# Patient Record
Sex: Male | Born: 1935 | Race: White | Hispanic: No | Smoking: Never smoker
Health system: Southern US, Community
[De-identification: ages and names within clinical notes are randomized; demographics above are authoritative.]

## PROBLEM LIST (undated history)

## (undated) DIAGNOSIS — J189 Pneumonia, unspecified organism: Secondary | ICD-10-CM

## (undated) DIAGNOSIS — Z8719 Personal history of other diseases of the digestive system: Secondary | ICD-10-CM

## (undated) DIAGNOSIS — I350 Nonrheumatic aortic (valve) stenosis: Secondary | ICD-10-CM

## (undated) DIAGNOSIS — Z5189 Encounter for other specified aftercare: Secondary | ICD-10-CM

## (undated) DIAGNOSIS — S300XXA Contusion of lower back and pelvis, initial encounter: Secondary | ICD-10-CM

## (undated) DIAGNOSIS — Z77090 Contact with and (suspected) exposure to asbestos: Secondary | ICD-10-CM

## (undated) DIAGNOSIS — E785 Hyperlipidemia, unspecified: Secondary | ICD-10-CM

## (undated) DIAGNOSIS — R011 Cardiac murmur, unspecified: Secondary | ICD-10-CM

## (undated) DIAGNOSIS — K219 Gastro-esophageal reflux disease without esophagitis: Secondary | ICD-10-CM

## (undated) DIAGNOSIS — C61 Malignant neoplasm of prostate: Secondary | ICD-10-CM

## (undated) DIAGNOSIS — S76011A Strain of muscle, fascia and tendon of right hip, initial encounter: Secondary | ICD-10-CM

## (undated) DIAGNOSIS — K5790 Diverticulosis of intestine, part unspecified, without perforation or abscess without bleeding: Secondary | ICD-10-CM

## (undated) HISTORY — DX: Malignant neoplasm of prostate: C61

## (undated) HISTORY — PX: ESOPHAGOGASTRODUODENOSCOPY: SHX1529

## (undated) HISTORY — DX: Pneumonia, unspecified organism: J18.9

## (undated) HISTORY — DX: Nonrheumatic aortic (valve) stenosis: I35.0

## (undated) HISTORY — DX: Personal history of other diseases of the digestive system: Z87.19

## (undated) HISTORY — DX: Encounter for other specified aftercare: Z51.89

## (undated) HISTORY — DX: Hyperlipidemia, unspecified: E78.5

---

## 1961-08-26 HISTORY — PX: APPENDECTOMY: SHX54

## 1968-08-26 HISTORY — PX: HEMORRHOID SURGERY: SHX153

## 1993-08-26 DIAGNOSIS — Z8711 Personal history of peptic ulcer disease: Secondary | ICD-10-CM

## 1993-08-26 HISTORY — DX: Personal history of peptic ulcer disease: Z87.11

## 1998-04-18 ENCOUNTER — Ambulatory Visit (HOSPITAL_COMMUNITY): Admission: RE | Admit: 1998-04-18 | Discharge: 1998-04-18 | Payer: Self-pay | Admitting: Internal Medicine

## 1999-06-18 ENCOUNTER — Other Ambulatory Visit: Admission: RE | Admit: 1999-06-18 | Discharge: 1999-06-18 | Payer: Self-pay | Admitting: Urology

## 1999-07-12 ENCOUNTER — Encounter: Payer: Self-pay | Admitting: Urology

## 1999-07-16 ENCOUNTER — Inpatient Hospital Stay (HOSPITAL_COMMUNITY): Admission: RE | Admit: 1999-07-16 | Discharge: 1999-07-19 | Payer: Self-pay | Admitting: Urology

## 2000-07-03 ENCOUNTER — Other Ambulatory Visit: Admission: RE | Admit: 2000-07-03 | Discharge: 2000-07-03 | Payer: Self-pay | Admitting: Gastroenterology

## 2000-08-26 DIAGNOSIS — C61 Malignant neoplasm of prostate: Secondary | ICD-10-CM

## 2000-08-26 HISTORY — PX: PROSTATECTOMY: SHX69

## 2000-08-26 HISTORY — DX: Malignant neoplasm of prostate: C61

## 2001-08-26 HISTORY — PX: LAPAROSCOPIC CHOLECYSTECTOMY: SUR755

## 2005-12-05 ENCOUNTER — Ambulatory Visit: Payer: Self-pay | Admitting: Gastroenterology

## 2005-12-19 ENCOUNTER — Ambulatory Visit: Payer: Self-pay | Admitting: Gastroenterology

## 2011-08-27 HISTORY — PX: COLONOSCOPY: SHX174

## 2011-11-20 DIAGNOSIS — M999 Biomechanical lesion, unspecified: Secondary | ICD-10-CM | POA: Diagnosis not present

## 2011-11-20 DIAGNOSIS — M5137 Other intervertebral disc degeneration, lumbosacral region: Secondary | ICD-10-CM | POA: Diagnosis not present

## 2011-11-25 DIAGNOSIS — M5137 Other intervertebral disc degeneration, lumbosacral region: Secondary | ICD-10-CM | POA: Diagnosis not present

## 2011-11-25 DIAGNOSIS — M999 Biomechanical lesion, unspecified: Secondary | ICD-10-CM | POA: Diagnosis not present

## 2011-11-26 DIAGNOSIS — M5137 Other intervertebral disc degeneration, lumbosacral region: Secondary | ICD-10-CM | POA: Diagnosis not present

## 2011-11-26 DIAGNOSIS — M999 Biomechanical lesion, unspecified: Secondary | ICD-10-CM | POA: Diagnosis not present

## 2011-11-28 DIAGNOSIS — M999 Biomechanical lesion, unspecified: Secondary | ICD-10-CM | POA: Diagnosis not present

## 2011-11-28 DIAGNOSIS — M5137 Other intervertebral disc degeneration, lumbosacral region: Secondary | ICD-10-CM | POA: Diagnosis not present

## 2011-12-02 DIAGNOSIS — M5137 Other intervertebral disc degeneration, lumbosacral region: Secondary | ICD-10-CM | POA: Diagnosis not present

## 2011-12-02 DIAGNOSIS — M999 Biomechanical lesion, unspecified: Secondary | ICD-10-CM | POA: Diagnosis not present

## 2011-12-04 DIAGNOSIS — M999 Biomechanical lesion, unspecified: Secondary | ICD-10-CM | POA: Diagnosis not present

## 2011-12-04 DIAGNOSIS — M5137 Other intervertebral disc degeneration, lumbosacral region: Secondary | ICD-10-CM | POA: Diagnosis not present

## 2011-12-05 DIAGNOSIS — M999 Biomechanical lesion, unspecified: Secondary | ICD-10-CM | POA: Diagnosis not present

## 2011-12-05 DIAGNOSIS — M5137 Other intervertebral disc degeneration, lumbosacral region: Secondary | ICD-10-CM | POA: Diagnosis not present

## 2011-12-09 DIAGNOSIS — M5137 Other intervertebral disc degeneration, lumbosacral region: Secondary | ICD-10-CM | POA: Diagnosis not present

## 2011-12-09 DIAGNOSIS — M999 Biomechanical lesion, unspecified: Secondary | ICD-10-CM | POA: Diagnosis not present

## 2011-12-11 DIAGNOSIS — M5137 Other intervertebral disc degeneration, lumbosacral region: Secondary | ICD-10-CM | POA: Diagnosis not present

## 2011-12-11 DIAGNOSIS — M999 Biomechanical lesion, unspecified: Secondary | ICD-10-CM | POA: Diagnosis not present

## 2011-12-12 DIAGNOSIS — M5137 Other intervertebral disc degeneration, lumbosacral region: Secondary | ICD-10-CM | POA: Diagnosis not present

## 2011-12-12 DIAGNOSIS — M999 Biomechanical lesion, unspecified: Secondary | ICD-10-CM | POA: Diagnosis not present

## 2011-12-16 DIAGNOSIS — M5137 Other intervertebral disc degeneration, lumbosacral region: Secondary | ICD-10-CM | POA: Diagnosis not present

## 2011-12-16 DIAGNOSIS — M999 Biomechanical lesion, unspecified: Secondary | ICD-10-CM | POA: Diagnosis not present

## 2011-12-18 DIAGNOSIS — M999 Biomechanical lesion, unspecified: Secondary | ICD-10-CM | POA: Diagnosis not present

## 2011-12-18 DIAGNOSIS — M5137 Other intervertebral disc degeneration, lumbosacral region: Secondary | ICD-10-CM | POA: Diagnosis not present

## 2011-12-19 DIAGNOSIS — M5137 Other intervertebral disc degeneration, lumbosacral region: Secondary | ICD-10-CM | POA: Diagnosis not present

## 2011-12-19 DIAGNOSIS — M999 Biomechanical lesion, unspecified: Secondary | ICD-10-CM | POA: Diagnosis not present

## 2011-12-23 DIAGNOSIS — M5137 Other intervertebral disc degeneration, lumbosacral region: Secondary | ICD-10-CM | POA: Diagnosis not present

## 2011-12-23 DIAGNOSIS — M999 Biomechanical lesion, unspecified: Secondary | ICD-10-CM | POA: Diagnosis not present

## 2011-12-25 DIAGNOSIS — M999 Biomechanical lesion, unspecified: Secondary | ICD-10-CM | POA: Diagnosis not present

## 2011-12-25 DIAGNOSIS — M5137 Other intervertebral disc degeneration, lumbosacral region: Secondary | ICD-10-CM | POA: Diagnosis not present

## 2011-12-26 DIAGNOSIS — M999 Biomechanical lesion, unspecified: Secondary | ICD-10-CM | POA: Diagnosis not present

## 2011-12-26 DIAGNOSIS — M5137 Other intervertebral disc degeneration, lumbosacral region: Secondary | ICD-10-CM | POA: Diagnosis not present

## 2011-12-30 DIAGNOSIS — M5137 Other intervertebral disc degeneration, lumbosacral region: Secondary | ICD-10-CM | POA: Diagnosis not present

## 2011-12-30 DIAGNOSIS — M999 Biomechanical lesion, unspecified: Secondary | ICD-10-CM | POA: Diagnosis not present

## 2012-01-01 DIAGNOSIS — M5137 Other intervertebral disc degeneration, lumbosacral region: Secondary | ICD-10-CM | POA: Diagnosis not present

## 2012-01-01 DIAGNOSIS — M999 Biomechanical lesion, unspecified: Secondary | ICD-10-CM | POA: Diagnosis not present

## 2012-01-02 DIAGNOSIS — M999 Biomechanical lesion, unspecified: Secondary | ICD-10-CM | POA: Diagnosis not present

## 2012-01-02 DIAGNOSIS — M5137 Other intervertebral disc degeneration, lumbosacral region: Secondary | ICD-10-CM | POA: Diagnosis not present

## 2012-01-07 DIAGNOSIS — M999 Biomechanical lesion, unspecified: Secondary | ICD-10-CM | POA: Diagnosis not present

## 2012-01-07 DIAGNOSIS — M5137 Other intervertebral disc degeneration, lumbosacral region: Secondary | ICD-10-CM | POA: Diagnosis not present

## 2012-01-08 DIAGNOSIS — M5137 Other intervertebral disc degeneration, lumbosacral region: Secondary | ICD-10-CM | POA: Diagnosis not present

## 2012-01-08 DIAGNOSIS — M999 Biomechanical lesion, unspecified: Secondary | ICD-10-CM | POA: Diagnosis not present

## 2012-01-09 DIAGNOSIS — M999 Biomechanical lesion, unspecified: Secondary | ICD-10-CM | POA: Diagnosis not present

## 2012-01-09 DIAGNOSIS — M5137 Other intervertebral disc degeneration, lumbosacral region: Secondary | ICD-10-CM | POA: Diagnosis not present

## 2012-01-13 DIAGNOSIS — M5137 Other intervertebral disc degeneration, lumbosacral region: Secondary | ICD-10-CM | POA: Diagnosis not present

## 2012-01-13 DIAGNOSIS — M999 Biomechanical lesion, unspecified: Secondary | ICD-10-CM | POA: Diagnosis not present

## 2012-01-15 DIAGNOSIS — M5137 Other intervertebral disc degeneration, lumbosacral region: Secondary | ICD-10-CM | POA: Diagnosis not present

## 2012-01-15 DIAGNOSIS — M999 Biomechanical lesion, unspecified: Secondary | ICD-10-CM | POA: Diagnosis not present

## 2012-01-16 DIAGNOSIS — M5137 Other intervertebral disc degeneration, lumbosacral region: Secondary | ICD-10-CM | POA: Diagnosis not present

## 2012-01-16 DIAGNOSIS — M999 Biomechanical lesion, unspecified: Secondary | ICD-10-CM | POA: Diagnosis not present

## 2012-01-21 DIAGNOSIS — M999 Biomechanical lesion, unspecified: Secondary | ICD-10-CM | POA: Diagnosis not present

## 2012-01-21 DIAGNOSIS — M5137 Other intervertebral disc degeneration, lumbosacral region: Secondary | ICD-10-CM | POA: Diagnosis not present

## 2012-01-22 DIAGNOSIS — M999 Biomechanical lesion, unspecified: Secondary | ICD-10-CM | POA: Diagnosis not present

## 2012-01-22 DIAGNOSIS — M5137 Other intervertebral disc degeneration, lumbosacral region: Secondary | ICD-10-CM | POA: Diagnosis not present

## 2012-01-23 DIAGNOSIS — M999 Biomechanical lesion, unspecified: Secondary | ICD-10-CM | POA: Diagnosis not present

## 2012-01-23 DIAGNOSIS — M5137 Other intervertebral disc degeneration, lumbosacral region: Secondary | ICD-10-CM | POA: Diagnosis not present

## 2012-01-27 DIAGNOSIS — M5137 Other intervertebral disc degeneration, lumbosacral region: Secondary | ICD-10-CM | POA: Diagnosis not present

## 2012-01-27 DIAGNOSIS — M999 Biomechanical lesion, unspecified: Secondary | ICD-10-CM | POA: Diagnosis not present

## 2012-01-29 DIAGNOSIS — M5137 Other intervertebral disc degeneration, lumbosacral region: Secondary | ICD-10-CM | POA: Diagnosis not present

## 2012-01-29 DIAGNOSIS — M999 Biomechanical lesion, unspecified: Secondary | ICD-10-CM | POA: Diagnosis not present

## 2012-01-30 DIAGNOSIS — M999 Biomechanical lesion, unspecified: Secondary | ICD-10-CM | POA: Diagnosis not present

## 2012-01-30 DIAGNOSIS — M5137 Other intervertebral disc degeneration, lumbosacral region: Secondary | ICD-10-CM | POA: Diagnosis not present

## 2012-02-03 DIAGNOSIS — M5137 Other intervertebral disc degeneration, lumbosacral region: Secondary | ICD-10-CM | POA: Diagnosis not present

## 2012-02-03 DIAGNOSIS — M999 Biomechanical lesion, unspecified: Secondary | ICD-10-CM | POA: Diagnosis not present

## 2012-05-19 DIAGNOSIS — Z23 Encounter for immunization: Secondary | ICD-10-CM | POA: Diagnosis not present

## 2012-05-22 DIAGNOSIS — E785 Hyperlipidemia, unspecified: Secondary | ICD-10-CM | POA: Diagnosis not present

## 2012-05-22 DIAGNOSIS — E559 Vitamin D deficiency, unspecified: Secondary | ICD-10-CM | POA: Diagnosis not present

## 2012-05-22 DIAGNOSIS — I10 Essential (primary) hypertension: Secondary | ICD-10-CM | POA: Diagnosis not present

## 2012-05-22 DIAGNOSIS — C61 Malignant neoplasm of prostate: Secondary | ICD-10-CM | POA: Diagnosis not present

## 2012-05-22 DIAGNOSIS — Z0000 Encounter for general adult medical examination without abnormal findings: Secondary | ICD-10-CM | POA: Diagnosis not present

## 2012-06-01 DIAGNOSIS — J449 Chronic obstructive pulmonary disease, unspecified: Secondary | ICD-10-CM | POA: Diagnosis not present

## 2012-06-01 DIAGNOSIS — Z77090 Contact with and (suspected) exposure to asbestos: Secondary | ICD-10-CM | POA: Diagnosis not present

## 2012-06-01 DIAGNOSIS — I2510 Atherosclerotic heart disease of native coronary artery without angina pectoris: Secondary | ICD-10-CM | POA: Diagnosis not present

## 2012-06-01 DIAGNOSIS — R0602 Shortness of breath: Secondary | ICD-10-CM | POA: Diagnosis not present

## 2012-06-16 ENCOUNTER — Encounter: Payer: Self-pay | Admitting: Gastroenterology

## 2012-07-03 ENCOUNTER — Encounter: Payer: Self-pay | Admitting: Gastroenterology

## 2012-07-03 ENCOUNTER — Ambulatory Visit (AMBULATORY_SURGERY_CENTER): Payer: Medicare Other | Admitting: *Deleted

## 2012-07-03 VITALS — Ht 72.0 in | Wt 263.8 lb

## 2012-07-03 DIAGNOSIS — Z1211 Encounter for screening for malignant neoplasm of colon: Secondary | ICD-10-CM

## 2012-07-03 DIAGNOSIS — Z86010 Personal history of colonic polyps: Secondary | ICD-10-CM

## 2012-07-03 MED ORDER — MOVIPREP 100 G PO SOLR: ORAL | Status: DC

## 2012-07-14 ENCOUNTER — Encounter: Payer: Self-pay | Admitting: Gastroenterology

## 2012-07-21 ENCOUNTER — Encounter: Payer: Self-pay | Admitting: Gastroenterology

## 2012-07-21 ENCOUNTER — Ambulatory Visit (AMBULATORY_SURGERY_CENTER): Payer: Medicare Other | Admitting: Gastroenterology

## 2012-07-21 VITALS — BP 141/77 | HR 72 | Temp 98.9°F | Resp 28 | Ht 72.0 in | Wt 263.0 lb

## 2012-07-21 DIAGNOSIS — Z800 Family history of malignant neoplasm of digestive organs: Secondary | ICD-10-CM | POA: Diagnosis not present

## 2012-07-21 DIAGNOSIS — E669 Obesity, unspecified: Secondary | ICD-10-CM | POA: Diagnosis not present

## 2012-07-21 DIAGNOSIS — Z86010 Personal history of colonic polyps: Secondary | ICD-10-CM | POA: Diagnosis not present

## 2012-07-21 DIAGNOSIS — Z1211 Encounter for screening for malignant neoplasm of colon: Secondary | ICD-10-CM

## 2012-07-21 DIAGNOSIS — D126 Benign neoplasm of colon, unspecified: Secondary | ICD-10-CM | POA: Diagnosis not present

## 2012-07-21 MED ORDER — SODIUM CHLORIDE 0.9 % IV SOLN: 500.0000 mL | INTRAVENOUS | Status: DC

## 2012-07-21 NOTE — Patient Instructions (Addendum)

## 2012-07-21 NOTE — Progress Notes (Signed)
Patient did not experience any of the following events: a burn prior to discharge; a fall within the facility; wrong site/side/patient/procedure/implant event; or a hospital transfer or hospital admission upon discharge from the facility. (G8907) Patient did not have preoperative order for IV antibiotic SSI prophylaxis. (G8918)  

## 2012-07-21 NOTE — Progress Notes (Signed)
Pressure applied to the abdomen to reach the cecum 

## 2012-07-21 NOTE — Progress Notes (Signed)
Propofol given over incremental dosages 

## 2012-07-21 NOTE — Op Note (Signed)
[facility] [address] [place], [address]   COLONOSCOPY PROCEDURE REPORT PATIENT: Gregory Pearson, Gregory Pearson  MR#: [id number] BIRTHDATE: 03/02/1928 , 78  yrs. old GENDER: Male ENDOSCOPIST: Meryl Dare, MD, [facility] PROCEDURE DATE:  07/21/2012 PROCEDURE:   Colonoscopy with snare polypectomy ASA CLASS:   Class II INDICATIONS:Patient's personal history of adenomatous colon polyps and patient's immediate family history of colon cancer. MEDICATIONS: MAC sedation, administered by CRNA and propofol (Diprivan) 200mg  IV DESCRIPTION OF PROCEDURE:   After the risks benefits and alternatives of the procedure were thoroughly explained, informed consent was obtained.  A digital rectal exam revealed no abnormalities of the rectum.   The LB [id number]  endoscope was introduced through the anus and advanced to the cecum, which was identified by both the appendix and ileocecal valve. No adverse events experienced.   The quality of the prep was good, using MoviPrep  The instrument was then slowly withdrawn as the colon was fully examined.  COLON FINDINGS: A sessile polyp measuring 5 mm in size was found at the cecum.  A polypectomy was performed with a cold snare.  The resection was complete and the polyp tissue was completely retrieved.  Two sessile polyps measuring 4-6 mm in size were found in the transverse colon. A polypectomy was performed with a cold snare. The resection was complete and the polyp tissue was completely retrieved.  A sessile polyp measuring 5 mm in size was found in the sigmoid colon.  A polypectomy was performed with a cold snare. The resection was complete and the polyp tissue was completely retrieved. Moderate diverticulosis was noted in the sigmoid colon.  The colon was otherwise normal.  There was no diverticulosis, inflammation, polyps or cancers unless previously stated.  Retroflexed views revealed no abnormalities. The time to cecum=6 minutes 26  seconds.  Withdrawal time=12 minutes 50 seconds. The scope was withdrawn and the procedure completed. COMPLICATIONS: There were no complications.  ENDOSCOPIC IMPRESSION: 1.   Sessile polyp measuring 5 mm in size was found at the cecum; polypectomy performed with a cold snare 2.   Two sessile polyps measuring 4-6 mm in the transverse colon; polypectomy performed with a cold snare 3.   Sessile polyp measuring 5 mm in the sigmoid colon; polypectomy performed with a cold snare 4.   Moderate diverticulosis was noted in the sigmoid colon  RECOMMENDATIONS: 1.  Await pathology results 2.  High fiber diet with liberal fluid intake. 3.  Given your age, you will not need another colonoscopy for colon cancer screening or polyp surveillance.  These types of tests usually stop around the age 78.  eSigned:  Meryl Dare, MD, [facility] 07/21/2012 1:42 PM   cc: Rudi Heap, MD   PATIENT NAME:  Gregory Pearson MR#: [id number]

## 2012-07-22 ENCOUNTER — Telehealth: Payer: Self-pay | Admitting: *Deleted

## 2012-07-22 NOTE — Telephone Encounter (Signed)
  Follow up Call-  Call back number 07/21/2012  Post procedure Call Back phone  # [phone]  Permission to leave phone message Yes     Patient questions:  Do you have a fever, pain , or abdominal swelling? no Pain Score  0 *  Have you tolerated food without any problems? yes  Have you been able to return to your normal activities? yes  Do you have any questions about your discharge instructions: Diet   no Medications  no Follow up visit  no  Do you have questions or concerns about your Care? no  Actions: * If pain score is 4 or above: No action needed, pain <4.

## 2012-07-28 ENCOUNTER — Encounter: Payer: Self-pay | Admitting: Gastroenterology

## 2013-06-21 ENCOUNTER — Ambulatory Visit (INDEPENDENT_AMBULATORY_CARE_PROVIDER_SITE_OTHER): Payer: Medicare Other

## 2013-06-21 DIAGNOSIS — Z23 Encounter for immunization: Secondary | ICD-10-CM

## 2013-06-29 ENCOUNTER — Encounter: Payer: Self-pay | Admitting: Family Medicine

## 2013-06-29 ENCOUNTER — Ambulatory Visit (INDEPENDENT_AMBULATORY_CARE_PROVIDER_SITE_OTHER): Payer: Medicare Other | Admitting: Family Medicine

## 2013-06-29 VITALS — BP 136/85 | HR 89 | Temp 97.5°F | Ht 71.0 in | Wt 266.6 lb

## 2013-06-29 DIAGNOSIS — K5900 Constipation, unspecified: Secondary | ICD-10-CM

## 2013-06-29 DIAGNOSIS — C61 Malignant neoplasm of prostate: Secondary | ICD-10-CM

## 2013-06-29 DIAGNOSIS — E785 Hyperlipidemia, unspecified: Secondary | ICD-10-CM | POA: Diagnosis not present

## 2013-06-29 DIAGNOSIS — Z0000 Encounter for general adult medical examination without abnormal findings: Secondary | ICD-10-CM | POA: Diagnosis not present

## 2013-06-29 LAB — POCT CBC
Granulocyte percent: 75.9 %G (ref 37–80)
HCT, POC: 50.4 % (ref 43.5–53.7)
Hemoglobin: 17 g/dL (ref 14.1–18.1)
Lymph, poc: 1.3 (ref 0.6–3.4)
MCH, POC: 31.1 pg (ref 27–31.2)
MCHC: 33.7 g/dL (ref 31.8–35.4)
MCV: 92 fL (ref 80–97)
MPV: 7.9 fL (ref 0–99.8)
POC Granulocyte: 4.3 (ref 2–6.9)
POC LYMPH PERCENT: 22.5 %L (ref 10–50)
Platelet Count, POC: 158 10*3/uL (ref 142–424)
RBC: 5.5 M/uL (ref 4.69–6.13)
RDW, POC: 13.3 %
WBC: 5.7 10*3/uL (ref 4.6–10.2)

## 2013-06-29 MED ORDER — DOCUSATE SODIUM 100 MG PO CAPS: ORAL_CAPSULE | ORAL | Status: DC

## 2013-06-29 MED ORDER — ATORVASTATIN CALCIUM 40 MG PO TABS: 40.0000 mg | ORAL_TABLET | Freq: Every day | ORAL | Status: DC

## 2013-06-29 NOTE — Patient Instructions (Signed)

## 2013-06-29 NOTE — Progress Notes (Signed)
  Subjective:    Patient ID: Gregory Pearson, male    DOB: [birth year], 79 y.o.   MRN: [id number]  HPI This 79 y.o. male presents for evaluation of CPE.  He has back pain And sees a [facility].  He has some constipation.  He has hx of prostactomy due To prostate cancer.  He has flu shot.  He has had a colonoscopy last year And it was normal.  He has had pneumococcal vaccination.  He is having some Gas and bowel spasms.   Review of Systems C/o constipation No chest pain, SOB, HA, dizziness, vision change, N/V, diarrhea, constipation, dysuria, urinary urgency or frequency, myalgias, arthralgias or rash.     Objective:   Physical Exam Vital signs noted  Well developed well nourished male.  HEENT - Head atraumatic Normocephalic                Eyes - PERRLA, Conjuctiva - clear Sclera- Clear EOMI                Ears - EAC's Wnl TM's Wnl Gross Hearing WNL                Nose - Nares patent                 Throat - oropharanx wnl Respiratory - Lungs CTA bilateral Cardiac - RRR S1 and S2 without murmur GI - Abdomen soft Nontender and bowel sounds active x 4 Extremities - No edema. Neuro - Grossly intact.       Assessment & Plan:  Hyperlipemia - Plan: Lipid panel  Unspecified constipation - Plan: POCT CBC, CMP14+EGFR, Lipid panel, Thyroid Panel With TSH, docusate sodium (COLACE) 100 MG capsule  Other and unspecified hyperlipidemia - Plan: atorvastatin (LIPITOR) 40 MG tablet  Routine general medical examination at a health care facility - Plan: POCT CBC, CMP14+EGFR, Lipid panel, PSA, total and free, Thyroid Panel With TSH  Prostate cancer - Plan: PSA, total and free  Deatra Canter FNP

## 2013-06-30 LAB — CMP14+EGFR
ALT: 21 IU/L (ref 0–44)
AST: 25 IU/L (ref 0–40)
Albumin/Globulin Ratio: 2.2 (ref 1.1–2.5)
Albumin: 5 g/dL — ABNORMAL HIGH (ref 3.5–4.7)
Alkaline Phosphatase: 79 IU/L (ref 39–117)
BUN/Creatinine Ratio: 19 (ref 10–22)
BUN: 17 mg/dL (ref 8–27)
CO2: 26 mmol/L (ref 18–29)
Calcium: 9.5 mg/dL (ref 8.6–10.2)
Chloride: 98 mmol/L (ref 97–108)
Creatinine, Ser: 0.91 mg/dL (ref 0.76–1.27)
GFR calc Af Amer: 90 mL/min/{1.73_m2} (ref >59)
GFR calc non Af Amer: 78 mL/min/{1.73_m2} (ref >59)
Globulin, Total: 2.3 g/dL (ref 1.5–4.5)
Glucose: 107 mg/dL — ABNORMAL HIGH (ref 65–99)
Potassium: 4.6 mmol/L (ref 3.5–5.2)
Sodium: 141 mmol/L (ref 134–144)
Total Bilirubin: 1 mg/dL (ref 0.0–1.2)
Total Protein: 7.3 g/dL (ref 6.0–8.5)

## 2013-06-30 LAB — PSA, TOTAL AND FREE
PSA, Free Pct: >10 %
PSA, Free: 0.01 ng/mL
PSA: <0.1 ng/mL (ref 0.0–4.0)

## 2013-06-30 LAB — THYROID PANEL WITH TSH
Free Thyroxine Index: 1.7 (ref 1.2–4.9)
T3 Uptake Ratio: 34 % (ref 24–39)
T4, Total: 5.1 ug/dL (ref 4.5–12.0)
TSH: 1.8 u[IU]/mL (ref 0.450–4.500)

## 2013-06-30 LAB — LIPID PANEL
Chol/HDL Ratio: 4.1 ratio units (ref 0.0–5.0)
Cholesterol, Total: 229 mg/dL — ABNORMAL HIGH (ref 100–199)
HDL: 56 mg/dL (ref >39)
LDL Calculated: 151 mg/dL — ABNORMAL HIGH (ref 0–99)
Triglycerides: 111 mg/dL (ref 0–149)
VLDL Cholesterol Cal: 22 mg/dL (ref 5–40)

## 2013-07-26 ENCOUNTER — Other Ambulatory Visit: Payer: Self-pay

## 2013-07-26 DIAGNOSIS — E785 Hyperlipidemia, unspecified: Secondary | ICD-10-CM

## 2013-07-26 MED ORDER — ATORVASTATIN CALCIUM 40 MG PO TABS: 40.0000 mg | ORAL_TABLET | Freq: Every day | ORAL | Status: DC

## 2014-01-12 ENCOUNTER — Ambulatory Visit (INDEPENDENT_AMBULATORY_CARE_PROVIDER_SITE_OTHER): Payer: Medicare Other | Admitting: Nurse Practitioner

## 2014-01-12 ENCOUNTER — Ambulatory Visit (INDEPENDENT_AMBULATORY_CARE_PROVIDER_SITE_OTHER): Payer: Medicare Other

## 2014-01-12 ENCOUNTER — Encounter: Payer: Self-pay | Admitting: Nurse Practitioner

## 2014-01-12 VITALS — BP 129/81 | HR 81 | Temp 98.8°F | Ht 71.0 in | Wt 238.0 lb

## 2014-01-12 DIAGNOSIS — M25579 Pain in unspecified ankle and joints of unspecified foot: Secondary | ICD-10-CM

## 2014-01-12 DIAGNOSIS — S9000XA Contusion of unspecified ankle, initial encounter: Secondary | ICD-10-CM

## 2014-01-12 DIAGNOSIS — S9001XA Contusion of right ankle, initial encounter: Secondary | ICD-10-CM

## 2014-01-12 NOTE — Patient Instructions (Signed)

## 2014-01-12 NOTE — Progress Notes (Signed)
   Subjective:    Patient ID: Gregory Pearson, male    DOB: [birth year], 80 y.o.   MRN: [id number]  HPI Patient was loading dirt in a wheel barrell and it turned over and slid down lateral side of right ankle. Slight pain when walking. Happened 2 day sago   Review of Systems  Constitutional: Negative.   HENT: Negative.   Respiratory: Negative.   Cardiovascular: Negative.   Gastrointestinal: Negative.   Genitourinary: Negative.   Hematological: Negative.   Psychiatric/Behavioral: Negative.   All other systems reviewed and are negative.      Objective:   Physical Exam  Constitutional: He is oriented to person, place, and time. He appears well-developed and well-nourished.  Cardiovascular: Normal rate, regular rhythm and normal heart sounds.   Pulmonary/Chest: Effort normal.  Musculoskeletal:  Erythematous bluish echymosis with edema right lateral ankle. FROM of right ankle Palpable pedal pulse.  Neurological: He is alert and oriented to person, place, and time.  Skin: Skin is warm and dry.  Psychiatric: He has a normal mood and affect. His behavior is normal. Judgment and thought content normal.     BP 129/81  Pulse 81  Temp(Src) 98.8 F (37.1 C) (Oral)  Ht 5\' 11"  (1.803 m)  Wt 238 lb (107.956 kg)  BMI 33.21 kg/m2  Right ankle- no fracture.    Assessment & Plan:   1. Pain in joint, ankle and foot   2. Contusion of right ankle    Ice Elevate when sitting RTO prn Tylenol OTC if painful  Mary-Margaret Hassell Done, FNP

## 2014-01-23 ENCOUNTER — Emergency Department (HOSPITAL_COMMUNITY): Payer: Medicare Other

## 2014-01-23 ENCOUNTER — Encounter (HOSPITAL_COMMUNITY): Payer: Self-pay | Admitting: Emergency Medicine

## 2014-01-23 ENCOUNTER — Inpatient Hospital Stay (HOSPITAL_COMMUNITY)
Admission: EM | Admit: 2014-01-23 | Discharge: 2014-01-24 | DRG: 538 | Disposition: A | Discharge status: Home Health | Payer: Medicare Other | Attending: Internal Medicine | Admitting: Internal Medicine

## 2014-01-23 DIAGNOSIS — Z8546 Personal history of malignant neoplasm of prostate: Secondary | ICD-10-CM | POA: Diagnosis not present

## 2014-01-23 DIAGNOSIS — S79929A Unspecified injury of unspecified thigh, initial encounter: Secondary | ICD-10-CM | POA: Diagnosis not present

## 2014-01-23 DIAGNOSIS — S79919A Unspecified injury of unspecified hip, initial encounter: Secondary | ICD-10-CM | POA: Diagnosis present

## 2014-01-23 DIAGNOSIS — E785 Hyperlipidemia, unspecified: Secondary | ICD-10-CM | POA: Diagnosis not present

## 2014-01-23 DIAGNOSIS — M25559 Pain in unspecified hip: Secondary | ICD-10-CM

## 2014-01-23 DIAGNOSIS — IMO0002 Reserved for concepts with insufficient information to code with codable children: Secondary | ICD-10-CM | POA: Diagnosis not present

## 2014-01-23 DIAGNOSIS — S7290XA Unspecified fracture of unspecified femur, initial encounter for closed fracture: Secondary | ICD-10-CM | POA: Diagnosis not present

## 2014-01-23 DIAGNOSIS — Z8673 Personal history of transient ischemic attack (TIA), and cerebral infarction without residual deficits: Secondary | ICD-10-CM

## 2014-01-23 DIAGNOSIS — D649 Anemia, unspecified: Secondary | ICD-10-CM | POA: Diagnosis present

## 2014-01-23 DIAGNOSIS — S76011A Strain of muscle, fascia and tendon of right hip, initial encounter: Secondary | ICD-10-CM | POA: Diagnosis present

## 2014-01-23 DIAGNOSIS — Z800 Family history of malignant neoplasm of digestive organs: Secondary | ICD-10-CM | POA: Diagnosis not present

## 2014-01-23 DIAGNOSIS — D696 Thrombocytopenia, unspecified: Secondary | ICD-10-CM | POA: Diagnosis not present

## 2014-01-23 DIAGNOSIS — S7291XA Unspecified fracture of right femur, initial encounter for closed fracture: Secondary | ICD-10-CM

## 2014-01-23 DIAGNOSIS — Z9079 Acquired absence of other genital organ(s): Secondary | ICD-10-CM

## 2014-01-23 DIAGNOSIS — W010XXA Fall on same level from slipping, tripping and stumbling without subsequent striking against object, initial encounter: Secondary | ICD-10-CM | POA: Diagnosis present

## 2014-01-23 DIAGNOSIS — S300XXA Contusion of lower back and pelvis, initial encounter: Secondary | ICD-10-CM | POA: Diagnosis present

## 2014-01-23 DIAGNOSIS — Y92009 Unspecified place in unspecified non-institutional (private) residence as the place of occurrence of the external cause: Secondary | ICD-10-CM

## 2014-01-23 DIAGNOSIS — R6889 Other general symptoms and signs: Secondary | ICD-10-CM | POA: Diagnosis not present

## 2014-01-23 HISTORY — DX: Contusion of lower back and pelvis, initial encounter: S30.0XXA

## 2014-01-23 HISTORY — DX: Strain of muscle, fascia and tendon of right hip, initial encounter: S76.011A

## 2014-01-23 LAB — BASIC METABOLIC PANEL
BUN: 14 mg/dL (ref 6–23)
CALCIUM: 8.5 mg/dL (ref 8.4–10.5)
CO2: 27 meq/L (ref 19–32)
Chloride: 104 mEq/L (ref 96–112)
Creatinine, Ser: 0.69 mg/dL (ref 0.50–1.35)
GFR calc Af Amer: >90 mL/min (ref >90)
GFR, EST NON AFRICAN AMERICAN: 85 mL/min — AB (ref >90)
Glucose, Bld: 99 mg/dL (ref 70–99)
Potassium: 4 mEq/L (ref 3.7–5.3)
SODIUM: 142 meq/L (ref 137–147)

## 2014-01-23 LAB — CBC WITH DIFFERENTIAL/PLATELET
BASOS ABS: 0 10*3/uL (ref 0.0–0.1)
Basophils Relative: 1 % (ref 0–1)
EOS PCT: 1 % (ref 0–5)
Eosinophils Absolute: 0.1 10*3/uL (ref 0.0–0.7)
HCT: 41.5 % (ref 39.0–52.0)
Hemoglobin: 13.8 g/dL (ref 13.0–17.0)
LYMPHS ABS: 1.2 10*3/uL (ref 0.7–4.0)
LYMPHS PCT: 14 % (ref 12–46)
MCH: 31.4 pg (ref 26.0–34.0)
MCHC: 33.3 g/dL (ref 30.0–36.0)
MCV: 94.5 fL (ref 78.0–100.0)
Monocytes Absolute: 0.6 10*3/uL (ref 0.1–1.0)
Monocytes Relative: 7 % (ref 3–12)
NEUTROS PCT: 77 % (ref 43–77)
Neutro Abs: 6.5 10*3/uL (ref 1.7–7.7)
PLATELETS: 151 10*3/uL (ref 150–400)
RBC: 4.39 MIL/uL (ref 4.22–5.81)
RDW: 13.6 % (ref 11.5–15.5)
WBC: 8.4 10*3/uL (ref 4.0–10.5)

## 2014-01-23 MED ORDER — METHOCARBAMOL 500 MG PO TABS: 500.0000 mg | ORAL_TABLET | Freq: Four times a day (QID) | ORAL | Status: DC | PRN

## 2014-01-23 MED ORDER — MORPHINE SULFATE 2 MG/ML IJ SOLN: 0.5000 mg | INTRAMUSCULAR | Status: DC | PRN

## 2014-01-23 MED ORDER — METHOCARBAMOL 1000 MG/10ML IJ SOLN
500.0000 mg | Freq: Four times a day (QID) | INTRAVENOUS | Status: DC | PRN
  Filled 2014-01-23: qty 5

## 2014-01-23 MED ORDER — HYDROCODONE-ACETAMINOPHEN 5-325 MG PO TABS
1.0000 | ORAL_TABLET | Freq: Four times a day (QID) | ORAL | Status: DC | PRN
  Administered 2014-01-24 (×2): 1 via ORAL
  Filled 2014-01-23 (×2): qty 1

## 2014-01-23 MED ORDER — ATORVASTATIN CALCIUM 40 MG PO TABS: 40.0000 mg | ORAL_TABLET | Freq: Every day | ORAL | Status: DC

## 2014-01-23 MED ORDER — DOCUSATE SODIUM 100 MG PO CAPS
100.0000 mg | ORAL_CAPSULE | Freq: Two times a day (BID) | ORAL | Status: DC
  Administered 2014-01-23 – 2014-01-24 (×2): 100 mg via ORAL
  Filled 2014-01-23 (×2): qty 1

## 2014-01-23 MED ORDER — HEPARIN SODIUM (PORCINE) 5000 UNIT/ML IJ SOLN
5000.0000 [IU] | Freq: Three times a day (TID) | INTRAMUSCULAR | Status: DC
  Administered 2014-01-23 – 2014-01-24 (×3): 5000 [IU] via SUBCUTANEOUS
  Filled 2014-01-23 (×3): qty 1

## 2014-01-23 NOTE — ED Notes (Signed)
Pt fell from standing position on Mon 01/17/14, pt was walking until today, unable to tolerate weight, bruising from rt hip to rt mid shaft of femur, pulses and CMS intact.

## 2014-01-23 NOTE — H&P (Signed)
PCP:   Redge Gainer, MD   Chief Complaint:  Right hip pain  HPI: 80 year old male who   has a past medical history of Hyperlipidemia; Blood transfusion without reported diagnosis; Prostate cancer (2002); and History of stomach ulcers. Today presents to the ED after he experienced right hip pain this morning causing him to lie down as patient was experiencing progressively worsening of the pain with inability to walk and bear weight on the right leg today. Patient had a fall while working in the yard about a week ago on 01/17/14, at that time he tripped causing him to land on the right side. After that patient was able to walk and did not have any difficulty in walking or pain for about a week. This morning he experienced the pain in the right hip, causing him to lay down and inability to bear weight. Patient also does with that he had a right ankle injury a few weeks ago, and was seen by the primary care physician who did an x-ray of the foot which was negative for fracture and diagnosed with ankle sprain. But patient was able to be awake despite the ankle sprain. Patient denies passing out, no chest pain no shortness of breath no nausea vomiting or diarrhea. No abdominal pain. The pain he rates at this time is minimal after he received pain medication, but this morning the pain was 8/10 in intensity which got worse on movement and walking. Patient does not have history of CAD, no stroke but does have a history of mini stroke in the past. He has a history of prostate cancer status post prostatectomy in 2002. In the ED x-ray of the hip was done which did not show fracture, CT of the hip showed possible hairline femur fracture. MRI was recommended. Orthopedics was consulted by the ED physician, who recommended medical admission and MRA in AM.   Allergies:  No Known Allergies    Past Medical History  Diagnosis Date  . Hyperlipidemia   . Blood transfusion without reported diagnosis     after  ruptured appendix 1963  . Prostate cancer 2002    prostatectomy  . History of stomach ulcers     Past Surgical History  Procedure Laterality Date  . Appendectomy  1963  . Hemorrhoid surgery  1970  . Prostatectomy  2002  . Laparoscopic cholecystectomy  2003    Prior to Admission medications   Medication Sig Start Date End Date Taking? Authorizing Provider  atorvastatin (LIPITOR) 40 MG tablet Take 40 mg by mouth every other day. 07/26/13  Yes Lysbeth Penner, FNP  ibuprofen (ADVIL,MOTRIN) 200 MG tablet Take 400 mg by mouth every 6 (six) hours as needed for mild pain.   Yes Historical Provider, MD  Multiple Vitamin (MULTIVITAMIN) tablet Take 1 tablet by mouth daily.   Yes Historical Provider, MD    Social History:  reports that he has never smoked. He has never used smokeless tobacco. He reports that he does not drink alcohol or use illicit drugs.  Family History  Problem Relation Age of Onset  . Colon cancer Brother 41  . Stomach cancer Neg Hx      All the positives are listed in BOLD  Review of Systems:  HEENT: Headache, blurred vision, runny nose, sore throat Neck: Hypothyroidism, hyperthyroidism,,lymphadenopathy Chest : Shortness of breath, history of COPD, Asthma Heart : Chest pain, history of coronary arterey disease GI:  Nausea, vomiting, diarrhea, constipation, GERD GU: Dysuria, urgency, frequency of urination, hematuria  Neuro: Stroke, seizures, syncope Psych: Depression, anxiety, hallucinations   Physical Exam: Blood pressure 123/83, pulse 88, temperature 98.2 F (36.8 C), temperature source Oral, resp. rate 18, height 6\' 1"  (1.854 m), weight 106.595 kg (235 lb), SpO2 97.00%. Constitutional:   Patient is a well-developed and well-nourished male in no acute distress and cooperative with exam. Head: Normocephalic and atraumatic Mouth: Mucus membranes moist Eyes: PERRL, EOMI, conjunctivae normal Cardiovascular: RRR, S1 normal, S2 normal Pulmonary/Chest: CTAB, no  wheezes, rales, or rhonchi Abdominal: Soft. Non-tender, non-distended, bowel sounds are normal, no masses, organomegaly, or guarding present.  Neurological: A&O x3, Strenght is normal and symmetric bilaterally, cranial nerve II-XII are grossly intact, no focal motor deficit, sensory intact to light touch bilaterally.  Extremities : No Cyanosis, Clubbing or Edema. No limitation of range of motion in the right leg on adduction abduction, internal rotation. Tender to palpation of right hip  Labs on Admission:   Radiological Exams on Admission: Dg Hip Complete Right  01/23/2014   CLINICAL DATA:  Hip pain.  Bruising.  Fall 2 weeks ago.  EXAM: RIGHT HIP - COMPLETE 2+ VIEW  COMPARISON:  None.  FINDINGS: Surgical clips are present in the anatomic pelvis compatible with pelvic lymphadenectomy. 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 Superior joint space narrowing with subchondral cysts in the acetabular roof. There is no fracture. Visualize pelvic rings are intact.  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 No acute osseous abnormality.   Electronically Signed   By: Dereck Ligas M.D.   On: 01/23/2014 16:19   Ct Hip Right Wo Contrast  01/23/2014   CLINICAL DATA:  [place] 5 days ago and is having right hip pain  EXAM: CT OF THE RIGHT HIP WITHOUT CONTRAST  TECHNIQUE: Multidetector CT imaging was performed according to the standard protocol. Multiplanar CT image reconstructions were also generated.  COMPARISON:  Radiographs performed earlier today  FINDINGS: There is diffuse osteopenia. This limits the sensitivity for potential fractures. There is an extremely subtle and equivocal abnormality in the subcapital portion of the right femur, consisting of a very subtle band of sclerosis through which a subtle curvilinear lucency may be present. There are no soft tissue abnormalities. There is mild arthritis of the right hip joint.  IMPRESSION: Extremely subtle  finding subcapital right femur both equivocal significance. The possibility of a hairline nondisplaced fracture of the femur is considered. Given the degree of osteopenia present, and MRI of the right hip is suggested for increased sensitivity.   Electronically Signed   By: Skipper Cliche M.D.   On: 01/23/2014 17:25    EKG: Independently reviewed- sinus rhythm, atrial premature complexes   Assessment/Plan Principal Problem:   Femur fracture, right Active Problems:   Hyperlipemia   Hip injury  Right femur fracture Patient has extremely subtle finding of subcapital right femur fracture, MRI of the hip has been ordered. Dr. Aline Brochure, orthopedics is aware and will see the patient in AM. Will initiate hip fracture protocol, Vicodin when necessary for pain.  Hyperlipidemia Continue atorvastatin  DVT prophylaxis Heparin  Risk stratification- patient has been active in his life style, no limitation on walking 2 blocks. Given the patient's age, he is a moderate risk for surgery.  Code status: Patient is full code, does not want to be on long-term life support  Family discussion: No family at bedside, discussed with patient in detail.   Time Spent on Admission: 65 minutes  Oswald Hillock [facility] Pager: [phone] 01/23/2014, 7:57  PM  If 7PM-7AM, please contact night-coverage  [email or web address]  Password [email or web address]

## 2014-01-23 NOTE — ED Provider Notes (Signed)
CSN: [id number]     Arrival date & time 01/23/14  1349 History  This chart was scribed for Maudry Diego, MD by Zettie Pho, ED Scribe. This patient was seen in room APAH2/APAH2 and the patient's care was started at 2:19 PM.    Chief Complaint  Patient presents with  . Fall   Patient is a 80 y.o. male presenting with fall. The history is provided by the patient. No language interpreter was used.  Fall This is a new problem. The current episode started more than 2 days ago. The problem occurs constantly. The problem has been gradually worsening. The symptoms are aggravated by walking (and heat). Treatments tried: heat. The treatment provided no relief.   HPI Comments: Gregory Pearson is a 80 y.o. male who presents to the Emergency Department complaining of a fall from standing that occurred about a week ago on 01/17/2014 when he reports that he tripped, causing him to land on his right side. Patient is complaining of a constant, gradual onset pain to the right hip with associated bruising that extends from the hip to the mid-thigh. Patient reports that he was ambulatory initially, but that the pain has been progressively worsening and he is unable to walk/bear weight on the leg today. He reports applying heat to the area at home, which he states also caused the pain to worsen. He denies any other injuries or pains secondary to the fall. Patient has a history of hyperlipidemia and prostate cancer (s/p prostatectomy in 2002).  Past Medical History  Diagnosis Date  . Hyperlipidemia   . Blood transfusion without reported diagnosis     after ruptured appendix 1963  . Prostate cancer 2002    prostatectomy  . History of stomach ulcers    Past Surgical History  Procedure Laterality Date  . Appendectomy  1963  . Hemorrhoid surgery  1970  . Prostatectomy  2002  . Laparoscopic cholecystectomy  2003   Family History  Problem Relation Age of Onset  . Colon cancer Brother 6  . Stomach cancer Neg Hx     History  Substance Use Topics  . Smoking status: Never Smoker   . Smokeless tobacco: Never Used  . Alcohol Use: No    Review of Systems  Musculoskeletal: Positive for arthralgias. Negative for joint swelling.  Skin: Positive for color change (bruising).      Allergies  Review of patient's allergies indicates no known allergies.  Home Medications   Prior to Admission medications   Medication Sig Start Date End Date Taking? Authorizing Provider  atorvastatin (LIPITOR) 40 MG tablet Take 1 tablet (40 mg total) by mouth daily. 07/26/13   Lysbeth Penner, FNP  docusate sodium (COLACE) 100 MG capsule One po qhs 06/29/13   Lysbeth Penner, FNP  Multiple Vitamin (MULTIVITAMIN) tablet Take 1 tablet by mouth daily.    Historical Provider, MD   Triage Vitals: BP 144/92  Pulse 96  Temp(Src) 98.2 F (36.8 C) (Oral)  Resp 18  Ht 6\' 1"  (1.854 m)  Wt 235 lb (106.595 kg)  BMI 31.01 kg/m2  SpO2 97%  Physical Exam  Constitutional: He is oriented to person, place, and time. He appears well-developed.  HENT:  Head: Normocephalic.  Eyes: Conjunctivae and EOM are normal. No scleral icterus.  Neck: Neck supple. No thyromegaly present.  Cardiovascular: Normal rate and regular rhythm.  Exam reveals no gallop and no friction rub.   No murmur heard. Pulmonary/Chest: No stridor. He has no wheezes. He has  no rales. He exhibits no tenderness.  Abdominal: He exhibits no distension. There is no tenderness. There is no rebound.  Musculoskeletal: Normal range of motion. He exhibits no edema.  Tenderness to the right hip with bruising laterally.   Lymphadenopathy:    He has no cervical adenopathy.  Neurological: He is oriented to person, place, and time. He exhibits normal muscle tone. Coordination normal.  Skin: No rash noted. No erythema.  Psychiatric: He has a normal mood and affect. His behavior is normal.    ED Course  Procedures (including critical care time)  DIAGNOSTIC  STUDIES: Oxygen Saturation is 97% on room air, normal by my interpretation.    COORDINATION OF CARE: 2:21 PM- Will order an x-ray of the right hip. Discussed treatment plan with patient at bedside and patient verbalized agreement.     Labs Review Labs Reviewed - No data to display  Imaging Review No results found.   EKG Interpretation None      MDM   Final diagnoses:  None    Admit for mri or right hip.  Possible fx.   Consulted with dr. Aline Brochure The chart was scribed for me under my direct supervision.  I personally performed the history, physical, and medical decision making and all procedures in the evaluation of this patient.Maudry Diego, MD 01/23/14 [phone]

## 2014-01-24 ENCOUNTER — Inpatient Hospital Stay (HOSPITAL_COMMUNITY): Payer: Medicare Other

## 2014-01-24 ENCOUNTER — Encounter (HOSPITAL_COMMUNITY): Payer: Self-pay | Admitting: Internal Medicine

## 2014-01-24 DIAGNOSIS — IMO0002 Reserved for concepts with insufficient information to code with codable children: Principal | ICD-10-CM

## 2014-01-24 DIAGNOSIS — D696 Thrombocytopenia, unspecified: Secondary | ICD-10-CM | POA: Diagnosis not present

## 2014-01-24 DIAGNOSIS — S300XXA Contusion of lower back and pelvis, initial encounter: Secondary | ICD-10-CM | POA: Diagnosis not present

## 2014-01-24 DIAGNOSIS — S76011A Strain of muscle, fascia and tendon of right hip, initial encounter: Secondary | ICD-10-CM

## 2014-01-24 DIAGNOSIS — S79929A Unspecified injury of unspecified thigh, initial encounter: Secondary | ICD-10-CM

## 2014-01-24 DIAGNOSIS — M25559 Pain in unspecified hip: Secondary | ICD-10-CM | POA: Diagnosis not present

## 2014-01-24 DIAGNOSIS — S79919A Unspecified injury of unspecified hip, initial encounter: Secondary | ICD-10-CM

## 2014-01-24 HISTORY — DX: Strain of muscle, fascia and tendon of right hip, initial encounter: S76.011A

## 2014-01-24 HISTORY — DX: Contusion of lower back and pelvis, initial encounter: S30.0XXA

## 2014-01-24 LAB — CBC
HCT: 36.8 % — ABNORMAL LOW (ref 39.0–52.0)
Hemoglobin: 12.1 g/dL — ABNORMAL LOW (ref 13.0–17.0)
MCH: 31.2 pg (ref 26.0–34.0)
MCHC: 32.9 g/dL (ref 30.0–36.0)
MCV: 94.8 fL (ref 78.0–100.0)
PLATELETS: 141 10*3/uL — AB (ref 150–400)
RBC: 3.88 MIL/uL — AB (ref 4.22–5.81)
RDW: 13.8 % (ref 11.5–15.5)
WBC: 6.4 10*3/uL (ref 4.0–10.5)

## 2014-01-24 LAB — BASIC METABOLIC PANEL
BUN: 15 mg/dL (ref 6–23)
CALCIUM: 8 mg/dL — AB (ref 8.4–10.5)
CO2: 28 meq/L (ref 19–32)
CREATININE: 0.67 mg/dL (ref 0.50–1.35)
Chloride: 104 mEq/L (ref 96–112)
GFR calc non Af Amer: 86 mL/min — ABNORMAL LOW (ref >90)
Glucose, Bld: 108 mg/dL — ABNORMAL HIGH (ref 70–99)
Potassium: 3.6 mEq/L — ABNORMAL LOW (ref 3.7–5.3)
SODIUM: 142 meq/L (ref 137–147)

## 2014-01-24 MED ORDER — POTASSIUM CHLORIDE CRYS ER 20 MEQ PO TBCR
20.0000 meq | EXTENDED_RELEASE_TABLET | Freq: Two times a day (BID) | ORAL | Status: DC
  Administered 2014-01-24: 20 meq via ORAL
  Filled 2014-01-24: qty 1

## 2014-01-24 MED ORDER — HYDROCODONE-ACETAMINOPHEN 5-325 MG PO TABS: 1.0000 | ORAL_TABLET | Freq: Four times a day (QID) | ORAL | Status: DC | PRN

## 2014-01-24 MED ORDER — METHOCARBAMOL 500 MG PO TABS: 500.0000 mg | ORAL_TABLET | Freq: Four times a day (QID) | ORAL | Status: DC | PRN

## 2014-01-24 NOTE — Consult Note (Signed)
  Consult note  Requested by Dr. Rexene Alberts  Chief complaint pain right hip  This is an 80 year old male otherwise fairly healthy independent ambulator active including outside activities and activities of daily living who fell approximately 6 days ago landed on his right hip. He felt some right hip pain in the buttock area but was able to get up and walk. However 5 days later he presents now with continued pain over his right cheek and difficulty standing on his right leg. X-rays were done in the emergency room which were inconclusive. They attempted to stand the patient couldn't stand up independently. He went for CT scan that showed subtle possible but inconclusive femoral neck fracture. He scheduled for MRI scan today to make a definitive diagnosis.  He denies groin or anterior thigh pain.  Other review of systems no fever or chills no night sweats. No numbness or tingling in the right leg and no back pain  Past Medical History  Diagnosis Date  . Hyperlipidemia   . Blood transfusion without reported diagnosis     after ruptured appendix 1963  . Prostate cancer 2002    prostatectomy  . History of stomach ulcers    Past Surgical History  Procedure Laterality Date  . Appendectomy  1963  . Hemorrhoid surgery  1970  . Prostatectomy  2002  . Laparoscopic cholecystectomy  2003   Medication list reviewed  Vital signs: BP 108/73  Pulse 58  Temp(Src) 97.7 F (36.5 C) (Oral)  Resp 18  Ht 5\' 11"  (1.803 m)  Wt 236 lb 8 oz (107.276 kg)  BMI 33.00 kg/m2  SpO2 98%   General the patient is well-developed and well-nourished grooming and hygiene are normal Oriented x3 Mood and affect normal Ambulation he cannot ambulate  Evaluation of the right lower extremity: He has no tenderness in the right groin or anterior thigh he has no pain there. He has tenderness over his right buttock. He performed a straight leg raise without difficulty. Hip flexion internal rotation cause no groin  pain. Log roll is negative for groin pain. The hip joint remains stable. Motor exam is normal skin is clean dry and intact. Has good distal pulse. Normal perfusion.  Has pain over his right ankle from a blunt trauma approximately one week prior to his fall His other extremities are well aligned without contracture subluxation atrophy or tremor  His regular plain films including pelvis AP and frog-leg lateral right hip are normal. There is subtle finding of possible fracture in the CT scan of the right hip at the femoral neck. MRI pending.  I agree that MRI should be performed. At this time it is unclear whether he will need hip pinning but I did discuss this with him including the risk of not getting the hip and having a subsequent fracture.  [place]

## 2014-01-24 NOTE — Evaluation (Signed)
Physical Therapy Evaluation Patient Details Name: Gregory Pearson MRN: [id number] DOB: 1927/10/22 Today's Date: 01/24/2014   History of Present Illness  Pt is a 80 yo male who had a fall one week ago. Pt with right hip pain and difficulty walking. MRI showed no fracture, but did show some muscle tears around the hip. Pt also had a right ankle sprain about 2 weeks ago.  Clinical Impression  Pt presents with some Right hip pain affecting his mobility and independence. Pt was able to ambulate 100 feet in the hall with supervision. Pt is safe to d/c home and recommend ordering a RW and HHPT safety eval.    Follow Up Recommendations Home health PT    Equipment Recommendations  Rolling walker with 5" wheels    Recommendations for Other Services       Precautions / Restrictions Restrictions RLE Weight Bearing: Weight bearing as tolerated      Mobility  Bed Mobility Overal bed mobility: Modified Independent                Transfers Overall transfer level: Modified independent Equipment used: Rolling walker (2 wheeled)             General transfer comment: cueas for technique with RW  Ambulation/Gait Ambulation/Gait assistance: Supervision Ambulation Distance (Feet): 100 Feet Assistive device: Rolling walker (2 wheeled) Gait Pattern/deviations: Step-through pattern   Gait velocity interpretation: Below normal speed for age/gender General Gait Details: cues to push RW further out and not step too far inside RW for improved safety.  Stairs            Wheelchair Mobility    Modified Rankin (Stroke Patients Only)       Balance Overall balance assessment: Needs assistance   Sitting balance-Leahy Scale: Normal     Standing balance support: Bilateral upper extremity supported Standing balance-Leahy Scale: Good                               Pertinent Vitals/Pain     Home Living Family/patient expects to be discharged to:: Private  residence Living Arrangements: Spouse/significant other Available Help at Discharge: Family Type of Home: House Home Access: Stairs to enter Entrance Stairs-Rails: Can reach both Entrance Stairs-Number of Steps: 3 Home Layout: Two level Home Equipment: None      Prior Function Level of Independence: Independent               Hand Dominance        Extremity/Trunk Assessment   Upper Extremity Assessment: Defer to OT evaluation           Lower Extremity Assessment: Overall WFL for tasks assessed      Cervical / Trunk Assessment: Normal  Communication   Communication: No difficulties  Cognition Arousal/Alertness: Awake/alert Behavior During Therapy: WFL for tasks assessed/performed Overall Cognitive Status: Within Functional Limits for tasks assessed                      General Comments      Exercises        Assessment/Plan    PT Assessment Patient needs continued PT services  PT Diagnosis Difficulty walking   PT Problem List Decreased activity tolerance;Decreased mobility;Decreased knowledge of use of DME  PT Treatment Interventions DME instruction;Functional mobility training;Gait training;Therapeutic activities;Therapeutic exercise;Balance training;Patient/family education   PT Goals (Current goals can be found in the Care Plan section) Acute Rehab PT Goals Patient  Stated Goal: to go home when i am ready. PT Goal Formulation: With patient Time For Goal Achievement: 02/07/14 Potential to Achieve Goals: Good    Frequency Min 3X/week   Barriers to discharge        Co-evaluation               End of Session Equipment Utilized During Treatment: Gait belt Activity Tolerance: Patient tolerated treatment well Patient left: in bed;with call bell/phone within reach Nurse Communication: Mobility status         Time: 1204-1228 PT Time Calculation (min): 24 min   Charges:   PT Evaluation $Initial PT Evaluation Tier I: 1  Procedure PT Treatments $Gait Training: 8-22 mins   PT G Codes:          Lelon Mast 01/24/2014, 12:33 PM

## 2014-01-24 NOTE — Progress Notes (Signed)
Patient given [place] at this time. Patient being taken down for MRI.

## 2014-01-24 NOTE — Care Management Note (Signed)
    Page 1 of 1   01/24/2014     3:28:24 PM CARE MANAGEMENT NOTE 01/24/2014  Patient:  Gregory, Pearson   Account Number:  [id number]  Date Initiated:  01/24/2014  Documentation initiated by:  Claretha Cooper  Subjective/Objective Assessment:   Pt lives at home with spouse. Fractured femur and will require DME and HH PT     Action/Plan:   Anticipated DC Date:  01/24/2014   Anticipated DC Plan:  [place]  CM consult      PAC Choice  [place]   Choice offered to / List presented to:  C-1 Patient   DME arranged  Vassie Moselle      DME agency  [place] arranged  [place].   Status of service:  Completed, signed off Medicare Important Message given?  NA - LOS <3 / Initial given by admissions (If response is "NO", the following Medicare IM given date fields will be blank) Date Medicare IM given:   Date Additional Medicare IM given:    Discharge Disposition:  [place]  Per UR Regulation:    If discussed at Long Length of Stay Meetings, dates discussed:    Comments:  01/24/14 Claretha Cooper RN BSN CM

## 2014-01-24 NOTE — Progress Notes (Signed)
MRI reviewed  There is no fracture the hip tears of the gluteus musculature and other small muscles around the hip  It is okay to start physical therapy weightbearing as tolerated with a walker or make orders. DVT prevention may be started at this time

## 2014-01-24 NOTE — Discharge Summary (Signed)
Physician Discharge Summary  Abdulkareem Badolato GDJ:[id number] DOB: 1928/08/10 DOA: 01/23/2014  PCP: Redge Gainer, MD  Admit date: 01/23/2014 Discharge date: 01/24/2014  Time spent: Greater than 30 minutes  Recommendations for Outpatient Follow-up:  1. Recommend followup CBC to evaluate his hemoglobin and platelet count (12.1 and 141 at the time of discharge respectively).  2. Home health physical therapy and a rolling walker Will ordered prior to discharge.  Discharge Diagnoses:  1. Muscle strain/muscle tear of the right gluteus maximus and medius, status post fall prior to hospital admission. No evidence of fracture. 2. Traumatic right gluteal hematoma associated with #1. 3. Mild anemia and thrombocytopenia, likely related to hemodilution from IV fluids.   Discharge Condition: Improved  Diet recommendation: Heart healthy  Filed Weights   01/23/14 1352 01/23/14 2147  Weight: 106.595 kg (235 lb) 107.276 kg (236 lb 8 oz)    History of present illness:   80 year old male who has a past medical history of Hyperlipidemia; Blood transfusion without reported diagnosis; Prostate cancer (2002); and History of stomach ulcers.  He presented to the ED after he experienced right hip pain yesterday morning causing him to lie down as he was experiencing progressively worsening of the pain with inability to walk and bear weight on the right leg. Patient had a fall while working in the yard about a week ago on 01/17/14. At that time he tripped causing him to land on the right side. After that patient was able to walk and did not have any difficulty in walking or pain for about a week until his presentation. Patient had a right ankle injury a few weeks ago, and was seen by his primary care physician who did an x-ray of the foot which was negative for fracture, but he was diagnosed with an ankle sprain.  Patient denied passing out.  No chest pain, no shortness of breath, no nausea or vomiting or diarrhea. No  abdominal pain. The pain he rated the pain as minimal in the ED after he received pain medication, but before it was 8/10 in intensity which got worse on movement and walking.  In the ED, x-ray of the hip was done which did not show a fracture.  CT of the hip showed possible hairline femur fracture. MRI was recommended. Orthopedics was consulted by the ED physician, who recommended medical admission and MRA for further evaluation.   Hospital Course:   The patient was started on supportive treatment and IV fluid hydration. IV and oral opiate analgesics were ordered as needed for pain. Orthopedic surgeon, Dr. Aline Brochure was consulted. He agreed with medical management and with obtaining an MRI of the right hip to rule out a definitive fracture. The MRI revealed no acute hip or pelvic fracture; tears of the gluteus medius and maximus muscles with a large intramuscular hematoma; there were also muscle strain/partial tears involving the adductor muscles and the right piriformis muscle. Given these findings, physical therapy was consulted. Following the evaluation, the therapist recommended a rolling walker and home health physical therapy which were both ordered. At the time of discharge, he was in little discomfort. He was instructed to avoid heavy lifting and manual labor for the next couple of weeks. He was discharged on hydrocodone and Robaxin as needed for muscle pain. He was instructed to discontinue ibuprofen.  Of note, his hemoglobin and platelet count fell slightly to 12.1 and 141 respectively. These decreases were thought to be secondary to the hemodilution effect of IV fluids and also equilibration from  the hematoma. Would recommend a followup CBC at his followup appointment to assess for interval changes.   Procedures:  None  Consultations:  Orthopedic surgeon Arther Abbott, M.D.  Discharge Exam: Filed Vitals:   01/24/14 1323  BP: 118/69  Pulse: 70  Temp: 99.6 F (37.6 C)  Resp: 18     General: Pleasant alert 80 year old man sitting up in bed, in no acute distress. Cardiovascular: S1, S2, with no murmurs rubs or gallops. Respiratory: Clear to auscultation bilaterally. Extremities: Right hip with large area of edema/hematoma and moderate tenderness. Patient was able to extend and flex his hip with some discomfort. No pedal edema. Pedal pulses palpable. Neurologic: He was alert and oriented x3. Cranial nerves II through XII are grossly intact.  Discharge Instructions You were cared for by a hospitalist during your hospital stay. If you have any questions about your discharge medications or the care you received while you were in the hospital after you are discharged, you can call the unit and asked to speak with the hospitalist on call if the hospitalist that took care of you is not available. Once you are discharged, your primary care physician will handle any further medical issues. Please note that NO REFILLS for any discharge medications will be authorized once you are discharged, as it is imperative that you return to your primary care physician (or establish a relationship with a primary care physician if you do not have one) for your aftercare needs so that they can reassess your need for medications and monitor your lab values.      Discharge Instructions   Diet - low sodium heart healthy    Complete by:  As directed      Discharge instructions    Complete by:  As directed   [place] 2-3 WEEKS.     Increase activity slowly    Complete by:  As directed             Medication List    STOP taking these medications       ibuprofen 200 MG tablet  Commonly known as:  ADVIL,MOTRIN      TAKE these medications       atorvastatin 40 MG tablet  Commonly known as:  LIPITOR  Take 40 mg by mouth every other day.     HYDROcodone-acetaminophen 5-325 MG per tablet  Commonly known as:  NORCO/VICODIN  Take 1-2 tablets by mouth every 6 (six) hours as  needed for moderate pain.     methocarbamol 500 MG tablet  Commonly known as:  ROBAXIN  Take 1 tablet (500 mg total) by mouth every 6 (six) hours as needed for muscle spasms.     multivitamin tablet  Take 1 tablet by mouth daily.       No Known Allergies Follow-up Information   Follow up with Redge Gainer, MD On 01/31/2014. (at 10:30 am)    Specialty:  Family Medicine   Contact information:   [place] [address] [phone]       Follow up with [facility]. ([facility], Walker to be delivered to hospital prior to DC)    Contact information:   [address] [place] [address] [phone]       The results of significant diagnostics from this hospitalization (including imaging, microbiology, ancillary and laboratory) are listed below for reference.    Significant Diagnostic Studies: Dg Hip Complete Right  01/23/2014   CLINICAL DATA:  Hip pain.  Bruising.  Fall 2 weeks ago.  EXAM: RIGHT HIP - COMPLETE 2+ VIEW  COMPARISON:  None.  FINDINGS: Surgical clips are present in the anatomic pelvis compatible with pelvic lymphadenectomy. 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 Superior joint space narrowing with subchondral cysts in the acetabular roof. There is no fracture. Visualize pelvic rings are intact.  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 No acute osseous abnormality.   Electronically Signed   By: Dereck Ligas M.D.   On: 01/23/2014 16:19   Dg Ankle Complete Right  01/12/2014   CLINICAL DATA:  Right ankle pain.  EXAM: RIGHT ANKLE - COMPLETE 3+ VIEW  COMPARISON:  None.  FINDINGS: Soft tissues about the ankle appear swollen, worse laterally. No acute bony or joint abnormality is identified. No tibiotalar joint effusion is seen. Small calcaneal spurs are noted.  IMPRESSION: Soft tissue swelling without underlying acute bony or joint abnormality.   Electronically Signed   By: Inge Rise M.D.    On: 01/12/2014 17:48   Ct Hip Right Wo Contrast  01/23/2014   CLINICAL DATA:  [place] 5 days ago and is having right hip pain  EXAM: CT OF THE RIGHT HIP WITHOUT CONTRAST  TECHNIQUE: Multidetector CT imaging was performed according to the standard protocol. Multiplanar CT image reconstructions were also generated.  COMPARISON:  Radiographs performed earlier today  FINDINGS: There is diffuse osteopenia. This limits the sensitivity for potential fractures. There is an extremely subtle and equivocal abnormality in the subcapital portion of the right femur, consisting of a very subtle band of sclerosis through which a subtle curvilinear lucency may be present. There are no soft tissue abnormalities. There is mild arthritis of the right hip joint.  IMPRESSION: Extremely subtle finding subcapital right femur both equivocal significance. The possibility of a hairline nondisplaced fracture of the femur is considered. Given the degree of osteopenia present, and MRI of the right hip is suggested for increased sensitivity.   Electronically Signed   By: Skipper Cliche M.D.   On: 01/23/2014 17:25   Mr Hip Right Wo Contrast  01/24/2014   CLINICAL DATA:  Right hip pain.  Fell 5 days ago.  EXAM: MRI OF THE RIGHT HIP WITHOUT CONTRAST  TECHNIQUE: Multiplanar, multisequence MR imaging was performed. No intravenous contrast was administered.  COMPARISON:  CT scan 01/23/2014.  FINDINGS: Both hips are normally located. There are moderate hip joint degenerative changes bilaterally. No acute hip fracture or avascular necrosis. The pubic symphysis and SI joints are intact. No pelvic fractures are identified. The pubic rami are normal.  There is a large hematoma involving the right hip and buttock region. The largest component is between the gluteus medius and maximus muscles. Both of these muscles demonstrate extensive edema and partial tears. There is also edema like signal abnormality in the adductor muscles bilaterally, right greater  than left consistent with muscle strains or partial tears. Similar findings involving the right piriformis muscle. The hamstring tendons are intact.  No significant intrapelvic abnormalities. There is a large amount of stool noted in the rectum.  IMPRESSION: 1. No acute hip or pelvic fracture. 2. Tears of the gluteus medius and maximus muscles with a large intermuscular hematoma. There are also muscle strain/partial tear is involving the adductor muscles and the right piriformis muscle.   Electronically Signed   By: Kalman Jewels M.D.   On: 01/24/2014 09:47    Microbiology: No results found for this or any previous visit (from the past  240 hour(s)).   Labs: Basic Metabolic Panel:  Recent Labs Lab 01/23/14 1920 01/24/14 0520  NA 142 142  K 4.0 3.6*  CL 104 104  CO2 27 28  GLUCOSE 99 108*  BUN 14 15  CREATININE 0.69 0.67  CALCIUM 8.5 8.0*   Liver Function Tests: No results found for this basename: AST, ALT, ALKPHOS, BILITOT, PROT, ALBUMIN,  in the last 168 hours No results found for this basename: LIPASE, AMYLASE,  in the last 168 hours No results found for this basename: AMMONIA,  in the last 168 hours CBC:  Recent Labs Lab 01/23/14 1920 01/24/14 0520  WBC 8.4 6.4  NEUTROABS 6.5  --   HGB 13.8 12.1*  HCT 41.5 36.8*  MCV 94.5 94.8  PLT 151 141*   Cardiac Enzymes: No results found for this basename: CKTOTAL, CKMB, CKMBINDEX, TROPONINI,  in the last 168 hours BNP: BNP (last 3 results) No results found for this basename: PROBNP,  in the last 8760 hours CBG: No results found for this basename: GLUCAP,  in the last 168 hours     Signed:  Rexene Alberts  [facility] 01/24/2014, 4:40 PM

## 2014-01-24 NOTE — Progress Notes (Signed)
IV removed. Discharge instructions reviewed with patient. Understanding verbalized. Awaiting walker before patient discharged home.

## 2014-01-25 DIAGNOSIS — E785 Hyperlipidemia, unspecified: Secondary | ICD-10-CM | POA: Diagnosis not present

## 2014-01-25 DIAGNOSIS — Z9181 History of falling: Secondary | ICD-10-CM | POA: Diagnosis not present

## 2014-01-25 DIAGNOSIS — IMO0001 Reserved for inherently not codable concepts without codable children: Secondary | ICD-10-CM | POA: Diagnosis not present

## 2014-01-25 DIAGNOSIS — IMO0002 Reserved for concepts with insufficient information to code with codable children: Secondary | ICD-10-CM | POA: Diagnosis not present

## 2014-01-25 DIAGNOSIS — Z8546 Personal history of malignant neoplasm of prostate: Secondary | ICD-10-CM | POA: Diagnosis not present

## 2014-01-25 DIAGNOSIS — M6281 Muscle weakness (generalized): Secondary | ICD-10-CM | POA: Diagnosis not present

## 2014-01-27 DIAGNOSIS — M6281 Muscle weakness (generalized): Secondary | ICD-10-CM | POA: Diagnosis not present

## 2014-01-27 DIAGNOSIS — Z9181 History of falling: Secondary | ICD-10-CM | POA: Diagnosis not present

## 2014-01-27 DIAGNOSIS — Z8546 Personal history of malignant neoplasm of prostate: Secondary | ICD-10-CM | POA: Diagnosis not present

## 2014-01-27 DIAGNOSIS — IMO0002 Reserved for concepts with insufficient information to code with codable children: Secondary | ICD-10-CM | POA: Diagnosis not present

## 2014-01-27 DIAGNOSIS — IMO0001 Reserved for inherently not codable concepts without codable children: Secondary | ICD-10-CM | POA: Diagnosis not present

## 2014-01-27 DIAGNOSIS — E785 Hyperlipidemia, unspecified: Secondary | ICD-10-CM | POA: Diagnosis not present

## 2014-01-31 ENCOUNTER — Ambulatory Visit (INDEPENDENT_AMBULATORY_CARE_PROVIDER_SITE_OTHER): Payer: Medicare Other | Admitting: Family Medicine

## 2014-01-31 ENCOUNTER — Encounter: Payer: Self-pay | Admitting: Family Medicine

## 2014-01-31 VITALS — BP 122/76 | HR 85 | Temp 98.1°F | Ht 71.0 in | Wt 240.0 lb

## 2014-01-31 DIAGNOSIS — S7291XA Unspecified fracture of right femur, initial encounter for closed fracture: Secondary | ICD-10-CM

## 2014-01-31 DIAGNOSIS — S7290XA Unspecified fracture of unspecified femur, initial encounter for closed fracture: Secondary | ICD-10-CM | POA: Diagnosis not present

## 2014-01-31 DIAGNOSIS — M949 Disorder of cartilage, unspecified: Secondary | ICD-10-CM

## 2014-01-31 DIAGNOSIS — M899 Disorder of bone, unspecified: Secondary | ICD-10-CM | POA: Diagnosis not present

## 2014-01-31 DIAGNOSIS — E876 Hypokalemia: Secondary | ICD-10-CM | POA: Diagnosis not present

## 2014-01-31 DIAGNOSIS — M8580 Other specified disorders of bone density and structure, unspecified site: Secondary | ICD-10-CM

## 2014-01-31 NOTE — Progress Notes (Signed)
   Subjective:    Patient ID: Gregory Pearson, male    DOB: Nov 29, 1927, 80 y.o.   MRN: [id number]  HPI This 80 y.o. male presents for evaluation of hospital follow up for distal right femur fracture.  He was admitted 01/23/14.  He has been losing weight and dieting.  He has been having muscle cramps.  He had low potassium in the hospital.  He has lost 30 pounds.  He had diffuse osteopenia on the CT of the right hip.  He is not taking any vitamin D or calcium.  He is getting PT at home and he is using cane to walk.  He needs disabled parking sticker.   Review of Systems C/o right hip pain No chest pain, SOB, HA, dizziness, vision change, N/V, diarrhea, constipation, dysuria, urinary urgency or frequency, myalgias, arthralgias or rash.     Objective:   Physical Exam  Vital signs noted  Well developed well nourished male.  HEENT - Head atraumatic Normocephalic Respiratory - Lungs CTA bilateral Cardiac - RRR S1 and S2 without murmur GI - Abdomen soft Nontender and bowel sounds active x 4 Extremities - No swelling MS - TTP right hip      Assessment & Plan:  Hypokalemia - Plan: CMP14+EGFR, Vit D  25 hydroxy (rtn osteoporosis monitoring)  Osteopenia - Plan: Vit D  25 hydroxy (rtn osteoporosis monitoring)  Femur fracture, right - disability parking form filled out.  Follow up with Orthopedics and continue PT At home.  Lysbeth Penner FNP

## 2014-02-01 DIAGNOSIS — IMO0002 Reserved for concepts with insufficient information to code with codable children: Secondary | ICD-10-CM | POA: Diagnosis not present

## 2014-02-01 DIAGNOSIS — Z8546 Personal history of malignant neoplasm of prostate: Secondary | ICD-10-CM | POA: Diagnosis not present

## 2014-02-01 DIAGNOSIS — IMO0001 Reserved for inherently not codable concepts without codable children: Secondary | ICD-10-CM | POA: Diagnosis not present

## 2014-02-01 DIAGNOSIS — M6281 Muscle weakness (generalized): Secondary | ICD-10-CM | POA: Diagnosis not present

## 2014-02-01 DIAGNOSIS — Z9181 History of falling: Secondary | ICD-10-CM | POA: Diagnosis not present

## 2014-02-01 DIAGNOSIS — E785 Hyperlipidemia, unspecified: Secondary | ICD-10-CM | POA: Diagnosis not present

## 2014-02-01 LAB — CMP14+EGFR
ALT: 15 IU/L (ref 0–44)
AST: 19 IU/L (ref 0–40)
Albumin/Globulin Ratio: 2.3 (ref 1.1–2.5)
Albumin: 4.1 g/dL (ref 3.5–4.7)
Alkaline Phosphatase: 94 IU/L (ref 39–117)
BUN/Creatinine Ratio: 20 (ref 10–22)
BUN: 14 mg/dL (ref 8–27)
CO2: 27 mmol/L (ref 18–29)
Calcium: 9.2 mg/dL (ref 8.6–10.2)
Chloride: 101 mmol/L (ref 97–108)
Creatinine, Ser: 0.69 mg/dL — ABNORMAL LOW (ref 0.76–1.27)
GFR calc Af Amer: 101 mL/min/{1.73_m2} (ref >59)
GFR calc non Af Amer: 87 mL/min/{1.73_m2} (ref >59)
Globulin, Total: 1.8 g/dL (ref 1.5–4.5)
Glucose: 136 mg/dL — ABNORMAL HIGH (ref 65–99)
Potassium: 4.3 mmol/L (ref 3.5–5.2)
Sodium: 140 mmol/L (ref 134–144)
Total Bilirubin: 1.2 mg/dL (ref 0.0–1.2)
Total Protein: 5.9 g/dL — ABNORMAL LOW (ref 6.0–8.5)

## 2014-02-01 LAB — VITAMIN D 25 HYDROXY (VIT D DEFICIENCY, FRACTURES): Vit D, 25-Hydroxy: 36.4 ng/mL (ref 30.0–100.0)

## 2014-02-01 NOTE — Care Management Utilization Note (Signed)
UR completed 

## 2014-02-04 DIAGNOSIS — M6281 Muscle weakness (generalized): Secondary | ICD-10-CM | POA: Diagnosis not present

## 2014-02-04 DIAGNOSIS — IMO0001 Reserved for inherently not codable concepts without codable children: Secondary | ICD-10-CM | POA: Diagnosis not present

## 2014-02-04 DIAGNOSIS — E785 Hyperlipidemia, unspecified: Secondary | ICD-10-CM | POA: Diagnosis not present

## 2014-02-04 DIAGNOSIS — Z9181 History of falling: Secondary | ICD-10-CM | POA: Diagnosis not present

## 2014-02-04 DIAGNOSIS — Z8546 Personal history of malignant neoplasm of prostate: Secondary | ICD-10-CM | POA: Diagnosis not present

## 2014-02-04 DIAGNOSIS — IMO0002 Reserved for concepts with insufficient information to code with codable children: Secondary | ICD-10-CM | POA: Diagnosis not present

## 2014-02-09 DIAGNOSIS — IMO0002 Reserved for concepts with insufficient information to code with codable children: Secondary | ICD-10-CM | POA: Diagnosis not present

## 2014-02-09 DIAGNOSIS — E785 Hyperlipidemia, unspecified: Secondary | ICD-10-CM | POA: Diagnosis not present

## 2014-02-09 DIAGNOSIS — IMO0001 Reserved for inherently not codable concepts without codable children: Secondary | ICD-10-CM | POA: Diagnosis not present

## 2014-02-09 DIAGNOSIS — Z9181 History of falling: Secondary | ICD-10-CM | POA: Diagnosis not present

## 2014-02-09 DIAGNOSIS — M6281 Muscle weakness (generalized): Secondary | ICD-10-CM | POA: Diagnosis not present

## 2014-02-09 DIAGNOSIS — Z8546 Personal history of malignant neoplasm of prostate: Secondary | ICD-10-CM | POA: Diagnosis not present

## 2014-02-22 DIAGNOSIS — E785 Hyperlipidemia, unspecified: Secondary | ICD-10-CM | POA: Diagnosis not present

## 2014-02-22 DIAGNOSIS — IMO0001 Reserved for inherently not codable concepts without codable children: Secondary | ICD-10-CM | POA: Diagnosis not present

## 2014-02-22 DIAGNOSIS — M6281 Muscle weakness (generalized): Secondary | ICD-10-CM | POA: Diagnosis not present

## 2014-02-22 DIAGNOSIS — IMO0002 Reserved for concepts with insufficient information to code with codable children: Secondary | ICD-10-CM

## 2014-05-25 ENCOUNTER — Ambulatory Visit (INDEPENDENT_AMBULATORY_CARE_PROVIDER_SITE_OTHER): Payer: Medicare Other

## 2014-05-25 DIAGNOSIS — Z23 Encounter for immunization: Secondary | ICD-10-CM | POA: Diagnosis not present

## 2014-06-08 ENCOUNTER — Other Ambulatory Visit: Payer: Self-pay | Admitting: *Deleted

## 2014-06-09 ENCOUNTER — Other Ambulatory Visit: Payer: Self-pay | Admitting: *Deleted

## 2014-06-09 MED ORDER — ATORVASTATIN CALCIUM 40 MG PO TABS: 40.0000 mg | ORAL_TABLET | ORAL | Status: DC

## 2014-07-06 ENCOUNTER — Encounter: Payer: Medicare Other | Admitting: Family Medicine

## 2014-07-07 ENCOUNTER — Ambulatory Visit (INDEPENDENT_AMBULATORY_CARE_PROVIDER_SITE_OTHER): Payer: Medicare Other | Admitting: Family Medicine

## 2014-07-07 ENCOUNTER — Encounter: Payer: Self-pay | Admitting: Family Medicine

## 2014-07-07 ENCOUNTER — Encounter (INDEPENDENT_AMBULATORY_CARE_PROVIDER_SITE_OTHER): Payer: Self-pay

## 2014-07-07 VITALS — BP 111/69 | HR 56 | Temp 97.1°F | Ht 71.0 in | Wt 218.2 lb

## 2014-07-07 DIAGNOSIS — E785 Hyperlipidemia, unspecified: Secondary | ICD-10-CM | POA: Diagnosis not present

## 2014-07-07 DIAGNOSIS — R634 Abnormal weight loss: Secondary | ICD-10-CM

## 2014-07-07 DIAGNOSIS — Z8546 Personal history of malignant neoplasm of prostate: Secondary | ICD-10-CM | POA: Diagnosis not present

## 2014-07-07 DIAGNOSIS — R5383 Other fatigue: Secondary | ICD-10-CM

## 2014-07-07 LAB — POCT CBC
Granulocyte percent: 68 %G (ref 37–80)
HCT, POC: 42.1 % — AB (ref 43.5–53.7)
Hemoglobin: 13.8 g/dL — AB (ref 14.1–18.1)
Lymph, poc: 1.4 (ref 0.6–3.4)
MCH, POC: 30.5 pg (ref 27–31.2)
MCHC: 32.9 g/dL (ref 31.8–35.4)
MCV: 92.7 fL (ref 80–97)
MPV: 7.9 fL (ref 0–99.8)
POC Granulocyte: 3.6 (ref 2–6.9)
POC LYMPH PERCENT: 27.2 %L (ref 10–50)
Platelet Count, POC: 125 10*3/uL — AB (ref 142–424)
RBC: 4.6 M/uL — AB (ref 4.69–6.13)
RDW, POC: 14.1 %
WBC: 5.3 10*3/uL (ref 4.6–10.2)

## 2014-07-07 MED ORDER — ATORVASTATIN CALCIUM 40 MG PO TABS: 40.0000 mg | ORAL_TABLET | ORAL | Status: DC

## 2014-07-07 NOTE — Progress Notes (Signed)
   Subjective:    Patient ID: Gregory Pearson, male    DOB: 1928/02/27, 80 y.o.   MRN: [id number]  HPI Patient is here for follow up.  He has had hx of prostate cancer and has had radical prostectomy.  He has hx of hyperlipidemia.  He has lost a lot of weight from dieting and exercising.  He has been having some fatigue and wanted to come in for a check up.  Review of Systems  Constitutional: Negative for fever.  HENT: Negative for ear pain.   Eyes: Negative for discharge.  Respiratory: Negative for cough.   Cardiovascular: Negative for chest pain.  Gastrointestinal: Negative for abdominal distention.  Endocrine: Negative for polyuria.  Genitourinary: Negative for difficulty urinating.  Musculoskeletal: Negative for gait problem and neck pain.  Skin: Negative for color change and rash.  Neurological: Negative for speech difficulty and headaches.  Psychiatric/Behavioral: Negative for agitation.       Objective:    BP 111/69 mmHg  Pulse 56  Temp(Src) 97.1 F (36.2 C) (Oral)  Ht _0  (1.803 m)  Wt 218 lb 3.2 oz (98.975 kg)  BMI 30.45 kg/m2 Physical Exam  Constitutional: He is oriented to person, place, and time. He appears well-developed and well-nourished.  HENT:  Head: Normocephalic and atraumatic.  Mouth/Throat: Oropharynx is clear and moist.  Eyes: Pupils are equal, round, and reactive to light.  Neck: Normal range of motion. Neck supple.  Cardiovascular: Normal rate and regular rhythm.   No murmur heard. Pulmonary/Chest: Effort normal and breath sounds normal.  Abdominal: Soft. Bowel sounds are normal. There is no tenderness.  Genitourinary: Rectum normal.  No prostate palpated and guiac stool is negative.  Neurological: He is alert and oriented to person, place, and time.  Skin: Skin is warm and dry.  Psychiatric: He has a normal mood and affect.          Assessment & Plan:     ICD-9-CM ICD-10-CM   1. Other fatigue 780.79 R53.83 POCT CBC     Thyroid Panel  With TSH     atorvastatin (LIPITOR) 40 MG tablet  2. Hyperlipemia 272.4 E78.5 CMP14+EGFR     Lipid panel     atorvastatin (LIPITOR) 40 MG tablet  3. Loss of weight 783.21 R63.4 atorvastatin (LIPITOR) 40 MG tablet  4. H/O prostate cancer V10.46 Z85.46 atorvastatin (LIPITOR) 40 MG tablet     No Follow-up on file.  Lysbeth Penner FNP

## 2014-07-08 LAB — LIPID PANEL
Chol/HDL Ratio: 1.9 ratio units (ref 0.0–5.0)
Cholesterol, Total: 135 mg/dL (ref 100–199)
HDL: 72 mg/dL (ref >39)
LDL Calculated: 51 mg/dL (ref 0–99)
Triglycerides: 60 mg/dL (ref 0–149)
VLDL Cholesterol Cal: 12 mg/dL (ref 5–40)

## 2014-07-08 LAB — CMP14+EGFR
ALT: 18 IU/L (ref 0–44)
AST: 25 IU/L (ref 0–40)
Albumin/Globulin Ratio: 2.5 (ref 1.1–2.5)
Albumin: 4.3 g/dL (ref 3.5–4.7)
Alkaline Phosphatase: 81 IU/L (ref 39–117)
BUN/Creatinine Ratio: 18 (ref 10–22)
BUN: 17 mg/dL (ref 8–27)
CO2: 26 mmol/L (ref 18–29)
Calcium: 9 mg/dL (ref 8.6–10.2)
Chloride: 100 mmol/L (ref 97–108)
Creatinine, Ser: 0.95 mg/dL (ref 0.76–1.27)
GFR calc Af Amer: 85 mL/min/{1.73_m2} (ref >59)
GFR calc non Af Amer: 73 mL/min/{1.73_m2} (ref >59)
Globulin, Total: 1.7 g/dL (ref 1.5–4.5)
Glucose: 90 mg/dL (ref 65–99)
Potassium: 4.3 mmol/L (ref 3.5–5.2)
Sodium: 143 mmol/L (ref 134–144)
Total Bilirubin: 1 mg/dL (ref 0.0–1.2)
Total Protein: 6 g/dL (ref 6.0–8.5)

## 2014-07-08 LAB — THYROID PANEL WITH TSH
Free Thyroxine Index: 1.5 (ref 1.2–4.9)
T3 Uptake Ratio: 30 % (ref 24–39)
T4, Total: 4.9 ug/dL (ref 4.5–12.0)
TSH: 1.33 u[IU]/mL (ref 0.450–4.500)

## 2014-07-17 ENCOUNTER — Other Ambulatory Visit: Payer: Self-pay | Admitting: Family Medicine

## 2014-08-23 DIAGNOSIS — H5203 Hypermetropia, bilateral: Secondary | ICD-10-CM | POA: Diagnosis not present

## 2014-08-23 DIAGNOSIS — H2513 Age-related nuclear cataract, bilateral: Secondary | ICD-10-CM | POA: Diagnosis not present

## 2014-08-23 DIAGNOSIS — H52223 Regular astigmatism, bilateral: Secondary | ICD-10-CM | POA: Diagnosis not present

## 2014-08-23 DIAGNOSIS — H00029 Hordeolum internum unspecified eye, unspecified eyelid: Secondary | ICD-10-CM | POA: Diagnosis not present

## 2015-02-20 ENCOUNTER — Other Ambulatory Visit: Payer: Self-pay

## 2015-06-07 ENCOUNTER — Ambulatory Visit (INDEPENDENT_AMBULATORY_CARE_PROVIDER_SITE_OTHER): Payer: Medicare Other

## 2015-06-07 DIAGNOSIS — Z23 Encounter for immunization: Secondary | ICD-10-CM | POA: Diagnosis not present

## 2015-07-11 ENCOUNTER — Ambulatory Visit (INDEPENDENT_AMBULATORY_CARE_PROVIDER_SITE_OTHER): Payer: Medicare Other | Admitting: Family Medicine

## 2015-07-11 ENCOUNTER — Encounter: Payer: Self-pay | Admitting: Family Medicine

## 2015-07-11 VITALS — BP 128/70 | HR 52 | Temp 96.8°F | Ht 71.0 in | Wt 220.4 lb

## 2015-07-11 DIAGNOSIS — E785 Hyperlipidemia, unspecified: Secondary | ICD-10-CM

## 2015-07-11 DIAGNOSIS — Z0000 Encounter for general adult medical examination without abnormal findings: Secondary | ICD-10-CM | POA: Insufficient documentation

## 2015-07-11 DIAGNOSIS — Z23 Encounter for immunization: Secondary | ICD-10-CM

## 2015-07-11 NOTE — Progress Notes (Signed)
   HPI  Patient presents today today for follow-up of hyperlipidemia and physical exam.  He is doing well He watches his diet closely with portion control and avoiding fried and fatty foods. He exercises 3 times a week and is active in his garden around the house.  He's taking Lipitor every day, he does not have any muscle aches or pains. He denies dyspnea, chest pain, palpitations, leg edema. He does have infrequent urge incontinence.  PMH: Smoking status noted ROS: Per HPI  Objective: BP 128/70 mmHg  Pulse 52  Temp(Src) 96.8 F (36 C) (Oral)  Ht $R'5\' 11"'aL$  (1.803 m)  Wt 220 lb 6.4 oz (99.973 kg)  BMI 30.75 kg/m2 Gen: NAD, alert, cooperative with exam HEENT: NCAT, right TM obscured by cerumen, left TM normal, nares clear, oropharynx clear CV: RRR, good S1/S2, no murmur Resp: CTABL, no wheezes, non-labored Abd: SNTND, BS present, no guarding or organomegaly Ext: No edema, warm Neuro: Alert and oriented, No gross deficits  Assessment and plan:  # Hyperlipidemia Tolerating Lipitor, continue Labs today Encouraged and congratulated on his very positive lifestyle choices. Follow-up one year  # Healthcare maintenance Given Prevnar today   Orders Placed This Encounter  Procedures  . Pneumococcal conjugate vaccine 13-valent  . CBC with Differential/Platelet  . CMP14+EGFR  . Lipid panel     Laroy Apple, MD [facility] 07/11/2015, 10:36 AM

## 2015-07-12 LAB — CBC WITH DIFFERENTIAL/PLATELET
Basophils Absolute: 0.1 10*3/uL (ref 0.0–0.2)
Basos: 1 %
EOS (ABSOLUTE): 0.2 10*3/uL (ref 0.0–0.4)
Eos: 4 %
Hematocrit: 43.8 % (ref 37.5–51.0)
Hemoglobin: 15.1 g/dL (ref 12.6–17.7)
Immature Grans (Abs): 0 10*3/uL (ref 0.0–0.1)
Immature Granulocytes: 0 %
Lymphocytes Absolute: 1.3 10*3/uL (ref 0.7–3.1)
Lymphs: 24 %
MCH: 31.9 pg (ref 26.6–33.0)
MCHC: 34.5 g/dL (ref 31.5–35.7)
MCV: 93 fL (ref 79–97)
MONOCYTES: 8 %
Monocytes Absolute: 0.4 10*3/uL (ref 0.1–0.9)
Neutrophils Absolute: 3.3 10*3/uL (ref 1.4–7.0)
Neutrophils: 63 %
PLATELETS: 137 10*3/uL — AB (ref 150–379)
RBC: 4.73 x10E6/uL (ref 4.14–5.80)
RDW: 12.9 % (ref 12.3–15.4)
WBC: 5.2 10*3/uL (ref 3.4–10.8)

## 2015-07-12 LAB — LIPID PANEL
CHOLESTEROL TOTAL: 184 mg/dL (ref 100–199)
Chol/HDL Ratio: 2.6 ratio units (ref 0.0–5.0)
HDL: 70 mg/dL (ref >39)
LDL Calculated: 102 mg/dL — ABNORMAL HIGH (ref 0–99)
Triglycerides: 60 mg/dL (ref 0–149)
VLDL CHOLESTEROL CAL: 12 mg/dL (ref 5–40)

## 2015-07-12 LAB — CMP14+EGFR
ALK PHOS: 85 IU/L (ref 39–117)
ALT: 18 IU/L (ref 0–44)
AST: 24 IU/L (ref 0–40)
Albumin/Globulin Ratio: 2.1 (ref 1.1–2.5)
Albumin: 4.4 g/dL (ref 3.5–4.7)
BUN/Creatinine Ratio: 15 (ref 10–22)
BUN: 11 mg/dL (ref 8–27)
Bilirubin Total: 1.1 mg/dL (ref 0.0–1.2)
CHLORIDE: 101 mmol/L (ref 97–106)
CO2: 24 mmol/L (ref 18–29)
CREATININE: 0.75 mg/dL — AB (ref 0.76–1.27)
Calcium: 9 mg/dL (ref 8.6–10.2)
GFR calc Af Amer: 97 mL/min/{1.73_m2} (ref >59)
GFR calc non Af Amer: 84 mL/min/{1.73_m2} (ref >59)
GLUCOSE: 85 mg/dL (ref 65–99)
Globulin, Total: 2.1 g/dL (ref 1.5–4.5)
Potassium: 4.7 mmol/L (ref 3.5–5.2)
SODIUM: 143 mmol/L (ref 136–144)
Total Protein: 6.5 g/dL (ref 6.0–8.5)

## 2015-07-12 LAB — COMMENT

## 2016-05-06 DIAGNOSIS — H5203 Hypermetropia, bilateral: Secondary | ICD-10-CM | POA: Diagnosis not present

## 2016-05-06 DIAGNOSIS — H52223 Regular astigmatism, bilateral: Secondary | ICD-10-CM | POA: Diagnosis not present

## 2016-05-06 DIAGNOSIS — H2513 Age-related nuclear cataract, bilateral: Secondary | ICD-10-CM | POA: Diagnosis not present

## 2016-05-06 DIAGNOSIS — H524 Presbyopia: Secondary | ICD-10-CM | POA: Diagnosis not present

## 2016-05-17 ENCOUNTER — Encounter: Payer: Self-pay | Admitting: Family Medicine

## 2016-05-17 ENCOUNTER — Ambulatory Visit (INDEPENDENT_AMBULATORY_CARE_PROVIDER_SITE_OTHER): Payer: Medicare Other | Admitting: Family Medicine

## 2016-05-17 VITALS — BP 137/72 | HR 59 | Temp 97.3°F | Ht 71.0 in | Wt 238.0 lb

## 2016-05-17 DIAGNOSIS — E785 Hyperlipidemia, unspecified: Secondary | ICD-10-CM

## 2016-05-17 DIAGNOSIS — Z0000 Encounter for general adult medical examination without abnormal findings: Secondary | ICD-10-CM

## 2016-05-17 DIAGNOSIS — I878 Other specified disorders of veins: Secondary | ICD-10-CM | POA: Insufficient documentation

## 2016-05-17 NOTE — Progress Notes (Signed)
Subjective:    Gregory Pearson is a 82 y.o. male who presents for Medicare Annual/Subsequent preventive examination.   Preventive Screening-Counseling & Management  Tobacco History  Smoking Status  . Never Smoker  Smokeless Tobacco  . Never Used    Problems Prior to Visit 1. See below  C/o leg swelling, BL legs symmetric for more than 1 year  Slightly off balance for 1-2 years, no falls  Current Problems (verified) Patient Active Problem List   Diagnosis Date Noted  . Healthcare maintenance 07/11/2015  . Hyperlipidemia 07/11/2015  . Muscle strain of right gluteal region 01/24/2014  . Traumatic hematoma of buttock 01/24/2014  . Thrombocytopenia ([place]) 01/24/2014  . Hip injury 01/23/2014  . Femur fracture, right ([place]) 01/23/2014  . Hyperlipemia 06/29/2013    Medications Prior to Visit Current Outpatient Prescriptions on File Prior to Visit  Medication Sig Dispense Refill  . Multiple Vitamin (MULTIVITAMIN) tablet Take 1 tablet by mouth daily.     No current facility-administered medications on file prior to visit.     Current Medications (verified) Current Outpatient Prescriptions  Medication Sig Dispense Refill  . Multiple Vitamin (MULTIVITAMIN) tablet Take 1 tablet by mouth daily.     No current facility-administered medications for this visit.      Allergies (verified) Review of patient's allergies indicates no known allergies.   PAST HISTORY  Family History Family History  Problem Relation Age of Onset  . Colon cancer Brother 77  . Stomach cancer Neg Hx     Social History Social History  Substance Use Topics  . Smoking status: Never Smoker  . Smokeless tobacco: Never Used  . Alcohol use No    Are there smokers in your home (other than you)?  No  Risk Factors Current exercise habits: Gym/ health club routine includes cardio, treadmill and gardening and fishing.  Dietary issues discussed: yes- no problems   Cardiac risk factors: advanced age  (older than 29 for men, 46 for women), dyslipidemia and male gender.  Depression Screen (Note: if answer to either of the following is "Yes", a more complete depression screening is indicated)   Q1: Over the past two weeks, have you felt down, depressed or hopeless? No  Q2: Over the past two weeks, have you felt little interest or pleasure in doing things? No  Have you lost interest or pleasure in daily life? No  Do you often feel hopeless? No  Do you cry easily over simple problems? No  Activities of Daily Living In your present state of health, do you have any difficulty performing the following activities?:  Driving? No Managing money?  No Feeding yourself? No Getting from bed to chair? No Climbing a flight of stairs? Yes, needs to use handrail Preparing food and eating?: No Bathing or showering? No Getting dressed: No Getting to the toilet? No Using the toilet:No Moving around from place to place: No In the past year have you fallen or had a near fall?:No   Are you sexually active?  No  Do you have more than one partner?  No  Hearing Difficulties: has hearing aid Do you often ask people to speak up or repeat themselves? No Do you experience ringing or noises in your ears? No Do you have difficulty understanding soft or whispered voices? No   Do you feel that you have a problem with memory? No  Do you often misplace items? No  Do you feel safe at home?  Yes  Cognitive Testing  Alert? Yes  Normal Appearance?Yes  Oriented to person? Yes  Place? Yes   Time? Yes  Recall of three objects?  Yes  Can perform simple calculations? Yes  Displays appropriate judgment?Yes  Can read the correct time from a watch face?Yes   Advanced Directives have been discussed with the patient? Yes   List the Names of Other Physician/Practitioners you currently use: 1.    Indicate any recent Medical Services you may have received from other than [facility] providers in the past year (date may be  approximate).  Immunization History  Administered Date(s) Administered  . Influenza,inj,Quad PF,36+ Mos 06/21/2013, 05/25/2014, 06/07/2015  . Pneumococcal Conjugate-13 07/11/2015  . Tdap 05/27/2007    Screening Tests Health Maintenance  Topic Date Due  . INFLUENZA VACCINE  10/26/2016 (Originally 03/26/2016)  . PNA vac Low Risk Adult (2 of 2 - PPSV23) 07/10/2016  . TETANUS/TDAP  05/26/2017  . ZOSTAVAX  Completed    All answers were reviewed with the patient and necessary referrals were made:  Kenn File, MD   05/17/2016   History reviewed: allergies, current medications, past family history, past medical history, past social history, past surgical history and problem list  Review of Systems Pertinent items are noted in HPI.    Objective:      Blood pressure 137/72, pulse (!) 59, temperature 97.3 F (36.3 C), temperature source Oral, height 5\' 11"  (1.803 m), weight 238 lb (108 kg). Body mass index is 33.19 kg/m.  Gen: NAD, alert, cooperative with exam HEENT: NCAT, EOMI, PERRL CV: RRR, good S1/S2, no murmur Resp: CTABL, no wheezes, non-labored Ext: Plus pitting edema bilateral lower extremities to the lower one third of the lower legs Neuro: Alert and oriented, No gross deficits      Assessment:     [name] is a pleasant 82 year old male here for an annual wellness visit. He is fasting and would like to get blood work done today. He would like to get his flu shot done on Monday with his wife, he has this scheduled with a nursing visit. He is planning to get advanced directives addressed.   Bilateral lower extremity venous stasis Benign appearance, recommended watchful waiting, could consider compression stockings  HLD Labs- likely not a good statin candidate due to age      Plan:     During the course of the visit the patient was educated and counseled about appropriate screening and preventive services including:    Influenza vaccine  Advanced  directives: pt to discuss withfamily  Diet review for nutrition referral? Yes ____  Not Indicated _X__   Patient Instructions (the written plan) was given to the patient.  Medicare Attestation I have personally reviewed: The patient's medical and social history Their use of alcohol, tobacco or illicit drugs Their current medications and supplements The patient's functional ability including ADLs,fall risks, home safety risks, cognitive, and hearing and visual impairment Diet and physical activities Evidence for depression or mood disorders  The patient's weight, height, BMI, and visual acuity have been recorded in the chart.  I have made referrals, counseling, and provided education to the patient based on review of the above and I have provided the patient with a written personalized care plan for preventive services.     Kenn File, MD   05/17/2016

## 2016-05-17 NOTE — Patient Instructions (Signed)
Great to see you!  Lets see you again in 1 year  We will send your labs on mychart or call within 1 week

## 2016-05-18 LAB — CMP14+EGFR
ALT: 14 IU/L (ref 0–44)
AST: 19 IU/L (ref 0–40)
Albumin/Globulin Ratio: 2 (ref 1.2–2.2)
Albumin: 4.4 g/dL (ref 3.5–4.7)
Alkaline Phosphatase: 79 IU/L (ref 39–117)
BUN/Creatinine Ratio: 23 (ref 10–24)
BUN: 16 mg/dL (ref 8–27)
Bilirubin Total: 0.6 mg/dL (ref 0.0–1.2)
CO2: 24 mmol/L (ref 18–29)
CREATININE: 0.7 mg/dL — AB (ref 0.76–1.27)
Calcium: 8.9 mg/dL (ref 8.6–10.2)
Chloride: 102 mmol/L (ref 96–106)
GFR calc Af Amer: 99 mL/min/{1.73_m2} (ref >59)
GFR calc non Af Amer: 85 mL/min/{1.73_m2} (ref >59)
GLUCOSE: 92 mg/dL (ref 65–99)
Globulin, Total: 2.2 g/dL (ref 1.5–4.5)
Potassium: 4.2 mmol/L (ref 3.5–5.2)
Sodium: 142 mmol/L (ref 134–144)
TOTAL PROTEIN: 6.6 g/dL (ref 6.0–8.5)

## 2016-05-18 LAB — CBC WITH DIFFERENTIAL/PLATELET
BASOS ABS: 0 10*3/uL (ref 0.0–0.2)
Basos: 1 %
EOS (ABSOLUTE): 0.2 10*3/uL (ref 0.0–0.4)
EOS: 3 %
HEMOGLOBIN: 15.8 g/dL (ref 12.6–17.7)
Hematocrit: 45.8 % (ref 37.5–51.0)
IMMATURE GRANS (ABS): 0 10*3/uL (ref 0.0–0.1)
IMMATURE GRANULOCYTES: 0 %
LYMPHS: 22 %
Lymphocytes Absolute: 1.3 10*3/uL (ref 0.7–3.1)
MCH: 31.9 pg (ref 26.6–33.0)
MCHC: 34.5 g/dL (ref 31.5–35.7)
MCV: 92 fL (ref 79–97)
MONOCYTES: 7 %
Monocytes Absolute: 0.4 10*3/uL (ref 0.1–0.9)
Neutrophils Absolute: 3.9 10*3/uL (ref 1.4–7.0)
Neutrophils: 67 %
Platelets: 131 10*3/uL — ABNORMAL LOW (ref 150–379)
RBC: 4.96 x10E6/uL (ref 4.14–5.80)
RDW: 13.9 % (ref 12.3–15.4)
WBC: 5.8 10*3/uL (ref 3.4–10.8)

## 2016-05-18 LAB — LIPID PANEL
CHOL/HDL RATIO: 2.7 ratio (ref 0.0–5.0)
Cholesterol, Total: 192 mg/dL (ref 100–199)
HDL: 70 mg/dL (ref >39)
LDL Calculated: 107 mg/dL — ABNORMAL HIGH (ref 0–99)
Triglycerides: 77 mg/dL (ref 0–149)
VLDL Cholesterol Cal: 15 mg/dL (ref 5–40)

## 2016-05-20 ENCOUNTER — Ambulatory Visit (INDEPENDENT_AMBULATORY_CARE_PROVIDER_SITE_OTHER): Payer: Medicare Other

## 2016-05-20 DIAGNOSIS — Z23 Encounter for immunization: Secondary | ICD-10-CM

## 2016-09-23 ENCOUNTER — Encounter: Payer: Self-pay | Admitting: Family Medicine

## 2016-09-23 ENCOUNTER — Ambulatory Visit (INDEPENDENT_AMBULATORY_CARE_PROVIDER_SITE_OTHER): Payer: Medicare Other | Admitting: Family Medicine

## 2016-09-23 VITALS — BP 133/86 | HR 111 | Temp 97.2°F | Ht 71.0 in | Wt 232.0 lb

## 2016-09-23 DIAGNOSIS — K921 Melena: Secondary | ICD-10-CM

## 2016-09-23 LAB — FINGERSTICK HEMOGLOBIN: Hemoglobin: 13.4 g/dL (ref 12.6–17.7)

## 2016-09-23 MED ORDER — OMEPRAZOLE 20 MG PO CPDR: 20.0000 mg | DELAYED_RELEASE_CAPSULE | Freq: Two times a day (BID) | ORAL | 3 refills | Status: DC

## 2016-09-23 NOTE — Progress Notes (Signed)
BP 133/86   Pulse (!) 111   Temp 97.2 F (36.2 C) (Oral)   Ht 5\' 11"  (1.803 m)   Wt 232 lb (105.2 kg)   BMI 32.36 kg/m    Subjective:    Patient ID: Gregory Pearson, male    DOB: 10/22/1927, 83 y.o.   MRN: [id number]  HPI: Gregory Pearson is a 83 y.o. male presenting on 09/23/2016 for Gas; Blood In Stools (Patient states he went 3 times last night and states there was blood in it.); and Fatigue (x 2 weeks)   HPI Belching and blood in stool Patient has been having increased gas and belching and he is also had blood in his stools. He thinks that's what at least because it is been darker stools that are black and tarry in nature. He says this happened 3 times last night and that is also been feeling increasing fatigue over the past couple weeks. He denies any shortness of breath or wheezing or chest pain.  Relevant past medical, surgical, family and social history reviewed and updated as indicated. Interim medical history since our last visit reviewed. Allergies and medications reviewed and updated.  Review of Systems  Constitutional: Positive for fatigue. Negative for chills and fever.  Respiratory: Negative for shortness of breath and wheezing.   Cardiovascular: Negative for chest pain and leg swelling.  Gastrointestinal: Positive for blood in stool. Negative for abdominal pain, constipation, diarrhea, nausea and vomiting.  Musculoskeletal: Negative for back pain and gait problem.  Skin: Negative for rash.  Neurological: Negative for dizziness, weakness and light-headedness.  All other systems reviewed and are negative.  Per HPI unless specifically indicated above     Objective:    BP 133/86   Pulse (!) 111   Temp 97.2 F (36.2 C) (Oral)   Ht 5\' 11"  (1.803 m)   Wt 232 lb (105.2 kg)   BMI 32.36 kg/m   Wt Readings from Last 3 Encounters:  09/23/16 232 lb (105.2 kg)  05/17/16 238 lb (108 kg)  07/11/15 220 lb 6.4 oz (100 kg)    Physical Exam  Constitutional: He is oriented  to person, place, and time. He appears well-developed and well-nourished. No distress.  Eyes: Conjunctivae are normal. No scleral icterus.  Cardiovascular: Normal rate, regular rhythm, normal heart sounds and intact distal pulses.   No murmur heard. Pulmonary/Chest: Effort normal and breath sounds normal. No respiratory distress. He has no wheezes. He has no rales.  Abdominal: Soft. Bowel sounds are normal. He exhibits no distension. There is no tenderness. There is no rebound and no guarding.  Musculoskeletal: Normal range of motion. He exhibits no edema.  Neurological: He is alert and oriented to person, place, and time. Coordination normal.  Skin: Skin is warm and dry. No rash noted. He is not diaphoretic.  Psychiatric: He has a normal mood and affect. His behavior is normal.  Nursing note and vitals reviewed.     Assessment & Plan:   Problem List Items Addressed This Visit    None    Visit Diagnoses    Gastric ulcer    -  Primary   Relevant Medications   omeprazole (PRILOSEC) 20 MG capsule   Other Relevant Orders   Fingerstick Hemoglobin (Completed)   Fecal occult blood, imunochemical       Follow up plan: Return if symptoms worsen or fail to improve.  Counseling provided for all of the vaccine components Orders Placed This Encounter  Procedures  . Fecal occult blood,  imunochemical  . Fingerstick Hemoglobin    Caryl Pina, MD [facility] 09/23/2016, 6:38 PM

## 2016-09-24 ENCOUNTER — Other Ambulatory Visit: Payer: Medicare Other

## 2016-09-24 DIAGNOSIS — K921 Melena: Secondary | ICD-10-CM

## 2016-09-25 ENCOUNTER — Inpatient Hospital Stay (HOSPITAL_COMMUNITY)
Admission: EM | Admit: 2016-09-25 | Discharge: 2016-09-27 | DRG: 378 | Disposition: A | Discharge status: Home / Self Care | Payer: Medicare Other | Attending: Family Medicine | Admitting: Family Medicine

## 2016-09-25 ENCOUNTER — Emergency Department (HOSPITAL_COMMUNITY): Payer: Medicare Other

## 2016-09-25 ENCOUNTER — Encounter (HOSPITAL_COMMUNITY): Payer: Self-pay | Admitting: Emergency Medicine

## 2016-09-25 DIAGNOSIS — R011 Cardiac murmur, unspecified: Secondary | ICD-10-CM | POA: Diagnosis present

## 2016-09-25 DIAGNOSIS — R739 Hyperglycemia, unspecified: Secondary | ICD-10-CM | POA: Diagnosis present

## 2016-09-25 DIAGNOSIS — E785 Hyperlipidemia, unspecified: Secondary | ICD-10-CM | POA: Diagnosis present

## 2016-09-25 DIAGNOSIS — Z8546 Personal history of malignant neoplasm of prostate: Secondary | ICD-10-CM

## 2016-09-25 DIAGNOSIS — K2970 Gastritis, unspecified, without bleeding: Secondary | ICD-10-CM | POA: Diagnosis not present

## 2016-09-25 DIAGNOSIS — K921 Melena: Secondary | ICD-10-CM | POA: Diagnosis not present

## 2016-09-25 DIAGNOSIS — Z66 Do not resuscitate: Secondary | ICD-10-CM | POA: Diagnosis present

## 2016-09-25 DIAGNOSIS — Z9049 Acquired absence of other specified parts of digestive tract: Secondary | ICD-10-CM

## 2016-09-25 DIAGNOSIS — T39315A Adverse effect of propionic acid derivatives, initial encounter: Secondary | ICD-10-CM | POA: Diagnosis present

## 2016-09-25 DIAGNOSIS — Z8719 Personal history of other diseases of the digestive system: Secondary | ICD-10-CM

## 2016-09-25 DIAGNOSIS — D62 Acute posthemorrhagic anemia: Secondary | ICD-10-CM | POA: Diagnosis not present

## 2016-09-25 DIAGNOSIS — K922 Gastrointestinal hemorrhage, unspecified: Secondary | ICD-10-CM | POA: Diagnosis present

## 2016-09-25 DIAGNOSIS — Z8042 Family history of malignant neoplasm of prostate: Secondary | ICD-10-CM

## 2016-09-25 DIAGNOSIS — Z8711 Personal history of peptic ulcer disease: Secondary | ICD-10-CM

## 2016-09-25 DIAGNOSIS — Y92019 Unspecified place in single-family (private) house as the place of occurrence of the external cause: Secondary | ICD-10-CM

## 2016-09-25 DIAGNOSIS — D696 Thrombocytopenia, unspecified: Secondary | ICD-10-CM | POA: Diagnosis present

## 2016-09-25 DIAGNOSIS — Z77090 Contact with and (suspected) exposure to asbestos: Secondary | ICD-10-CM

## 2016-09-25 DIAGNOSIS — K254 Chronic or unspecified gastric ulcer with hemorrhage: Secondary | ICD-10-CM | POA: Diagnosis not present

## 2016-09-25 DIAGNOSIS — Z79899 Other long term (current) drug therapy: Secondary | ICD-10-CM

## 2016-09-25 DIAGNOSIS — K319 Disease of stomach and duodenum, unspecified: Secondary | ICD-10-CM | POA: Diagnosis present

## 2016-09-25 DIAGNOSIS — Z800 Family history of malignant neoplasm of digestive organs: Secondary | ICD-10-CM

## 2016-09-25 DIAGNOSIS — R112 Nausea with vomiting, unspecified: Secondary | ICD-10-CM | POA: Diagnosis not present

## 2016-09-25 HISTORY — DX: Contact with and (suspected) exposure to asbestos: Z77.090

## 2016-09-25 HISTORY — DX: Diverticulosis of intestine, part unspecified, without perforation or abscess without bleeding: K57.90

## 2016-09-25 HISTORY — DX: Gastro-esophageal reflux disease without esophagitis: K21.9

## 2016-09-25 LAB — COMPREHENSIVE METABOLIC PANEL
ALK PHOS: 64 U/L (ref 38–126)
ALT: 20 U/L (ref 17–63)
AST: 25 U/L (ref 15–41)
Albumin: 3.9 g/dL (ref 3.5–5.0)
Anion gap: 8 (ref 5–15)
BUN: 17 mg/dL (ref 6–20)
CALCIUM: 8.8 mg/dL — AB (ref 8.9–10.3)
CO2: 29 mmol/L (ref 22–32)
Chloride: 98 mmol/L — ABNORMAL LOW (ref 101–111)
Creatinine, Ser: 0.78 mg/dL (ref 0.61–1.24)
GFR calc Af Amer: >60 mL/min (ref >60)
GFR calc non Af Amer: >60 mL/min (ref >60)
Glucose, Bld: 111 mg/dL — ABNORMAL HIGH (ref 65–99)
Potassium: 3.8 mmol/L (ref 3.5–5.1)
Sodium: 135 mmol/L (ref 135–145)
TOTAL PROTEIN: 6.2 g/dL — AB (ref 6.5–8.1)
Total Bilirubin: 0.6 mg/dL (ref 0.3–1.2)

## 2016-09-25 LAB — CBC
HCT: 37.5 % — ABNORMAL LOW (ref 39.0–52.0)
HEMATOCRIT: 34.1 % — AB (ref 39.0–52.0)
Hemoglobin: 12.7 g/dL — ABNORMAL LOW (ref 13.0–17.0)
Hemoglobin: 11.9 g/dL — ABNORMAL LOW (ref 13.0–17.0)
MCH: 32.3 pg (ref 26.0–34.0)
MCH: 33.1 pg (ref 26.0–34.0)
MCHC: 33.9 g/dL (ref 30.0–36.0)
MCHC: 34.9 g/dL (ref 30.0–36.0)
MCV: 95.4 fL (ref 78.0–100.0)
MCV: 94.7 fL (ref 78.0–100.0)
PLATELETS: 150 10*3/uL (ref 150–400)
Platelets: 126 10*3/uL — ABNORMAL LOW (ref 150–400)
RBC: 3.93 MIL/uL — ABNORMAL LOW (ref 4.22–5.81)
RBC: 3.6 MIL/uL — ABNORMAL LOW (ref 4.22–5.81)
RDW: 13 % (ref 11.5–15.5)
RDW: 12.9 % (ref 11.5–15.5)
WBC: 5.7 10*3/uL (ref 4.0–10.5)
WBC: 5.7 10*3/uL (ref 4.0–10.5)

## 2016-09-25 LAB — TYPE AND SCREEN
ABO/RH(D): O POS
ANTIBODY SCREEN: NEGATIVE

## 2016-09-25 LAB — POC OCCULT BLOOD, ED: Fecal Occult Bld: POSITIVE — AB

## 2016-09-25 LAB — FECAL OCCULT BLOOD, IMMUNOCHEMICAL: Fecal Occult Bld: POSITIVE — AB

## 2016-09-25 MED ORDER — PANTOPRAZOLE SODIUM 40 MG IV SOLR
INTRAVENOUS | Status: AC
  Filled 2016-09-25: qty 160

## 2016-09-25 MED ORDER — ACETAMINOPHEN 325 MG PO TABS: 650.0000 mg | ORAL_TABLET | Freq: Four times a day (QID) | ORAL | Status: DC | PRN

## 2016-09-25 MED ORDER — ACETAMINOPHEN 650 MG RE SUPP: 650.0000 mg | Freq: Four times a day (QID) | RECTAL | Status: DC | PRN

## 2016-09-25 MED ORDER — PANTOPRAZOLE SODIUM 40 MG IV SOLR: 40.0000 mg | Freq: Two times a day (BID) | INTRAVENOUS | Status: DC

## 2016-09-25 MED ORDER — ONDANSETRON HCL 4 MG PO TABS: 4.0000 mg | ORAL_TABLET | Freq: Four times a day (QID) | ORAL | Status: DC | PRN

## 2016-09-25 MED ORDER — ONDANSETRON HCL 4 MG/2ML IJ SOLN: 4.0000 mg | Freq: Four times a day (QID) | INTRAMUSCULAR | Status: DC | PRN

## 2016-09-25 MED ORDER — PANTOPRAZOLE SODIUM 40 MG IV SOLR
40.0000 mg | Freq: Two times a day (BID) | INTRAVENOUS | Status: DC
  Administered 2016-09-26 – 2016-09-27 (×3): 40 mg via INTRAVENOUS
  Filled 2016-09-25 (×3): qty 40

## 2016-09-25 MED ORDER — LACTATED RINGERS IV SOLN
INTRAVENOUS | Status: DC
  Administered 2016-09-25 – 2016-09-26 (×3): via INTRAVENOUS

## 2016-09-25 MED ORDER — PANTOPRAZOLE SODIUM 40 MG IV SOLR
80.0000 mg | Freq: Once | INTRAVENOUS | Status: AC
  Administered 2016-09-25: 80 mg via INTRAVENOUS
  Filled 2016-09-25: qty 80

## 2016-09-25 MED ORDER — SODIUM CHLORIDE 0.9 % IV SOLN
8.0000 mg/h | INTRAVENOUS | Status: DC
  Administered 2016-09-25: 8 mg/h via INTRAVENOUS
  Filled 2016-09-25 (×3): qty 80

## 2016-09-25 NOTE — H&P (Signed)
History and Physical    Gregory Pearson [id number] DOB: [birth year] DOA: 09/25/2016  PCP: Kenn File, MD; [facility] Consultants:  None Patient coming from: home - lives with wife; NOK: son, Gregory Pearson, [phone]  Chief Complaint: GI bleeding  HPI: Gregory Pearson is a 83 y.o. male with medical history significant of remote prostate cancer and remote stomach ulcer  (1995, H. Pylori positive) presenting with blood in his stool.  Has been present for 3-4 days, got worse.  Black stools.  Emesis x 1 and it was "sort of dark, flaky like.  I got to thinking about it and it might have been blood."  Some abdominal pain but "not that much."  Weak and dizzy about 2 days ago.  Not light-headed with standing now.  Took some Advil about 5 days ago, 2 tablets at a time, uncertain how many times.  "If I feel tired or other, sometimes I take some."  Has h/o ulcer "50 year ago."   ED Course: Per Dr. Thurnell Garbe: 1930: H/H as above. Given hx gastric ulcer and recent N/V "with blood," IV protonix bolus and gtt started. VS otherwise stable. Dx and testing d/w pt and family. Questions answered. Verb understanding, agreeable to d/c home with outpt f/u. T/C to [facility]. Lorin Mercy, case discussed, including: HPI, pertinent PM/SHx, VS/PE, dx testing, ED course and treatment: Agreeable to admit, requests to write temporary orders, obtain medical bed to team APAdmits.   Review of Systems: As per HPI; otherwise 10 point review of systems reviewed and negative.   Ambulatory Status: ambulates independently, but has a walker if needed  Past Medical History:  Diagnosis Date  . Blood transfusion without reported diagnosis    after ruptured appendix 1963  . Diverticulosis   . History of stomach ulcers   . Hyperlipidemia   . Muscle strain of right gluteal region 01/24/2014  . Prostate cancer [facility]) 2002   prostatectomy  . Traumatic hematoma of buttock 01/24/2014    Past Surgical History:  Procedure Laterality Date  . APPENDECTOMY   1963  . [place]  . LAPAROSCOPIC CHOLECYSTECTOMY  2003  . PROSTATECTOMY  2002    Social History   Social History  . Marital status: Married    Spouse name: N/A  . Number of children: N/A  . Years of education: N/A   Occupational History  . retired    Social History Main Topics  . Smoking status: Never Smoker  . Smokeless tobacco: Never Used  . Alcohol use No  . Drug use: No  . Sexual activity: Not on file   Other Topics Concern  . Not on file   Social History Narrative  . No narrative on file    No Known Allergies  Family History  Problem Relation Age of Onset  . Colon cancer Brother 78  . Stomach cancer Neg Hx     Prior to Admission medications   Medication Sig Start Date End Date Taking? Authorizing Provider  Multiple Vitamin (MULTIVITAMIN) tablet Take 1 tablet by mouth daily.    Historical Provider, MD  omeprazole (PRILOSEC) 20 MG capsule Take 1 capsule (20 mg total) by mouth 2 (two) times daily before a meal. 09/23/16   Fransisca Kaufmann [place], MD    Physical Exam: Vitals:   09/25/16 1800 09/25/16 1830 09/25/16 1900 09/25/16 1915  BP: 124/84 110/74 120/83   Pulse: 87 85 86 88  Resp: 14 11 15 13   Temp:      TempSrc:  SpO2: 100% 98% 99% 98%  Weight:      Height:         General:  Appears calm and comfortable and is NAD Eyes:  PERRL, EOMI, normal lids, iris ENT:  grossly normal hearing, lips & tongue, mmm Neck:  no LAD, masses or thyromegaly Cardiovascular:  RRR, no m/r/g. No LE edema.  Respiratory:  CTA bilaterally, no w/r/r. Normal respiratory effort. Abdomen:  soft, ntnd, NABS Skin:  no rash or induration seen on limited exam Musculoskeletal:  grossly normal tone BUE/BLE, good ROM, no bony abnormality Psychiatric:  grossly normal mood and affect, speech fluent and appropriate, AOx3 Neurologic:  CN 2-12 grossly intact, moves all extremities in coordinated fashion, sensation intact  Labs on Admission: I have personally reviewed  following labs and imaging studies  CBC:  Recent Labs Lab 09/25/16 1714  WBC 5.7  HGB 12.7*  HCT 37.5*  MCV 95.4  PLT [id number]   Basic Metabolic Panel:  Recent Labs Lab 09/25/16 1714  NA 135  K 3.8  CL 98*  CO2 29  GLUCOSE 111*  BUN 17  CREATININE 0.78  CALCIUM 8.8*   GFR: Estimated Creatinine Clearance: 81.8 mL/min (by C-G formula based on SCr of 0.78 mg/dL). Liver Function Tests:  Recent Labs Lab 09/25/16 1714  AST 25  ALT 20  ALKPHOS 64  BILITOT 0.6  PROT 6.2*  ALBUMIN 3.9   No results for input(s): LIPASE, AMYLASE in the last 168 hours. No results for input(s): AMMONIA in the last 168 hours. Coagulation Profile: No results for input(s): INR, PROTIME in the last 168 hours. Cardiac Enzymes: No results for input(s): CKTOTAL, CKMB, CKMBINDEX, TROPONINI in the last 168 hours. BNP (last 3 results) No results for input(s): PROBNP in the last 8760 hours. HbA1C: No results for input(s): HGBA1C in the last 72 hours. CBG: No results for input(s): GLUCAP in the last 168 hours. Lipid Profile: No results for input(s): CHOL, HDL, LDLCALC, TRIG, CHOLHDL, LDLDIRECT in the last 72 hours. Thyroid Function Tests: No results for input(s): TSH, T4TOTAL, FREET4, T3FREE, THYROIDAB in the last 72 hours. Anemia Panel: No results for input(s): VITAMINB12, FOLATE, FERRITIN, TIBC, IRON, RETICCTPCT in the last 72 hours. Urine analysis: No results found for: COLORURINE, APPEARANCEUR, LABSPEC, PHURINE, GLUCOSEU, HGBUR, BILIRUBINUR, KETONESUR, PROTEINUR, UROBILINOGEN, NITRITE, LEUKOCYTESUR  Creatinine Clearance: Estimated Creatinine Clearance: 81.8 mL/min (by C-G formula based on SCr of 0.78 mg/dL).  Sepsis Labs: @LABRCNTIP (procalcitonin:4,lacticidven:4) ) Recent Results (from the past 240 hour(s))  Fecal occult blood, imunochemical     Status: Abnormal   Collection Time: 09/24/16  9:41 AM  Result Value Ref Range Status   Fecal Occult Bld Positive (A) Negative Final      Radiological Exams on Admission: Dg Abd Acute W/chest  Result Date: 09/25/2016 CLINICAL DATA:  Nausea, vomiting, and blood in stool for 2 days. EXAM: DG ABDOMEN ACUTE W/ 1V CHEST COMPARISON:  None. FINDINGS: Mild generalized gaseous distention of small bowel and colon is seen, suspicious for mild ileus. No evidence of bowel obstruction for free intraperitoneal air. Surgical clips seen from prior cholecystectomy. Surgical clips also seen within the pelvis. Heart size is normal. Tortuosity and atherosclerotic calcification of thoracic aorta noted. Calcified granuloma seen in the medial left lung base. No evidence of pulmonary infiltrate or pleural effusion. IMPRESSION: Suspect mild adynamic ileus.  No evidence of bowel obstruction. No active cardiopulmonary disease.  Aortic atherosclerosis. Electronically Signed   By: Earle Gell M.D.   On: 09/25/2016 18:28    EKG:  not done  Assessment/Plan Principal Problem:   GI bleeding Active Problems:   Hyperglycemia   GI bleeding -Patient with prior heme positive stools in PCP office on 1/29 with ongoing melena in stools and decreased Hgb -Hgb 12.7; 13.4 on 1/29; 15.8 on 05/17/16 -Heme positive -This is likely due to gastritis/gastric ulcer in the setting of NSAID use. -Hemodynamically stable. -Type and screen were done in ED.  -Would transfuse for symptomatic anemia or Hgb <7.  - Will observe on Med Surg. - GI consult placed for tomorrow AM, will follow up recommendations - NPO for possible EGD - LR at 125 mL/hr - Start IV pantoprazole 40 mg bid (he was started on a Protonix drip in the ER, but given days of hemodynamic stability and no active upper GI bleeding, will change to BID IV therapy). - Zofran IV for nausea - Avoid NSAIDs and SQ heparin - Maintain IV access (2 large bore IVs if possible). - Monitor closely and follow q6h cbc, transfuse as necessary.  Hyperglycemia -Glucose 111 -May be stress response -Will follow with fasting AM  labs -It is unlikely that he will need acute or chronic treatment for this issue   DVT prophylaxis: SCDs Code Status: DNR - confirmed with patient Family Communication: None present Disposition Plan:  Home once clinically improved Consults called: GI (order placed)  Admission status: It is my clinical opinion that referral for OBSERVATION is reasonable and necessary in this patient based on the above information provided. The aforementioned taken together are felt to place the patient at high risk for further clinical deterioration. However it is anticipated that the patient may be medically stable for discharge from the hospital within 24 to 48 hours.    Karmen Bongo MD [facility]  If 7PM-7AM, please contact night-coverage [email or web address] Password [email or web address]  09/25/2016, 8:40 PM

## 2016-09-25 NOTE — ED Provider Notes (Signed)
[facility] DEPT Provider Note   CSN: [id number] Arrival date & time: 09/25/16  [id number]     History   Chief Complaint Chief Complaint  Patient presents with  . Blood In Stools    HPI Gregory Pearson is a 83 y.o. male.  HPI  Pt was seen at 1740. Per pt, c/o gradual onset and persistence of multiple intermittent episodes of "black stools" that began 3 to 4 days ago.   Describes the stools as "black and tarry." Has been associated with one episode of N/V a few days ago "that had blood in it." Endorses hx of gastric ulcers with similar symptoms. Denies abd pain, no CP/SOB, no back pain, no fevers.    Past Medical History:  Diagnosis Date  . Blood transfusion without reported diagnosis    after ruptured appendix 1963  . Diverticulosis   . History of stomach ulcers   . Hyperlipidemia   . Muscle strain of right gluteal region 01/24/2014  . Prostate cancer [facility]) 2002   prostatectomy  . Traumatic hematoma of buttock 01/24/2014    Patient Active Problem List   Diagnosis Date Noted  . Venous stasis 05/17/2016  . Healthcare maintenance 07/11/2015  . Hyperlipidemia 07/11/2015  . Muscle strain of right gluteal region 01/24/2014  . Traumatic hematoma of buttock 01/24/2014  . Thrombocytopenia ([place]) 01/24/2014  . Hip injury 01/23/2014  . Femur fracture, right ([place]) 01/23/2014  . Hyperlipemia 06/29/2013    Past Surgical History:  Procedure Laterality Date  . APPENDECTOMY  1963  . [place]  . LAPAROSCOPIC CHOLECYSTECTOMY  2003  . PROSTATECTOMY  2002       Home Medications    Prior to Admission medications   Medication Sig Start Date End Date Taking? Authorizing Provider  Multiple Vitamin (MULTIVITAMIN) tablet Take 1 tablet by mouth daily.    Historical Provider, MD  omeprazole (PRILOSEC) 20 MG capsule Take 1 capsule (20 mg total) by mouth 2 (two) times daily before a meal. 09/23/16   Fransisca Kaufmann Dettinger, MD    Family History Family History  Problem Relation  Age of Onset  . Colon cancer Brother 81  . Stomach cancer Neg Hx     Social History Social History  Substance Use Topics  . Smoking status: Never Smoker  . Smokeless tobacco: Never Used  . Alcohol use No     Allergies   Patient has no known allergies.   Review of Systems Review of Systems ROS: Statement: All systems negative except as marked or noted in the HPI; Constitutional: Negative for fever and chills. ; ; Eyes: Negative for eye pain, redness and discharge. ; ; ENMT: Negative for ear pain, hoarseness, nasal congestion, sinus pressure and sore throat. ; ; Cardiovascular: Negative for chest pain, palpitations, diaphoresis, dyspnea and peripheral edema. ; ; Respiratory: Negative for cough, wheezing and stridor. ; ; Gastrointestinal: Negative for nausea, vomiting, diarrhea, abdominal pain, +hematemesis, black stools. ; ; Genitourinary: Negative for dysuria, flank pain and hematuria. ; ; Musculoskeletal: Negative for back pain and neck pain. Negative for swelling and trauma.; ; Skin: Negative for pruritus, rash, abrasions, blisters, bruising and skin lesion.; ; Neuro: Negative for headache, lightheadedness and neck stiffness. Negative for weakness, altered level of consciousness, altered mental status, extremity weakness, paresthesias, involuntary movement, seizure and syncope.       Physical Exam Updated Vital Signs BP 124/84   Pulse 87   Temp 98 F (36.7 C) (Oral)   Resp 14   Ht 5\' 11"  (  1.803 m)   Wt 232 lb (105.2 kg)   SpO2 100%   BMI 32.36 kg/m   Physical Exam 1745: Physical examination:  Nursing notes reviewed; Vital signs and O2 SAT reviewed;  Constitutional: Well developed, Well nourished, Well hydrated, In no acute distress; Head:  Normocephalic, atraumatic; Eyes: EOMI, PERRL, No scleral icterus; ENMT: Mouth and pharynx normal, Mucous membranes moist; Neck: Supple, Full range of motion, No lymphadenopathy; Cardiovascular: Regular rate and rhythm, No gallop;  Respiratory: Breath sounds clear & equal bilaterally, No wheezes.  Speaking full sentences with ease, Normal respiratory effort/excursion; Chest: Nontender, Movement normal; Abdomen: Soft, Nontender, Nondistended, Normal bowel sounds. Rectal exam performed w/permission of pt and ED RN chaperone present.  Anal tone normal.  Non-tender, soft black stool in rectal vault, heme positive.  No fissures, no external hemorrhoids, no palp masses.; Genitourinary: No CVA tenderness; Extremities: Pulses normal, No tenderness, No edema, No calf edema or asymmetry.; Neuro: AA&Ox3, Major CN grossly intact.  Speech clear. No gross focal motor or sensory deficits in extremities.; Skin: Color normal, Warm, Dry.   ED Treatments / Results  Labs (all labs ordered are listed, but only abnormal results are displayed)   EKG  EKG Interpretation None       Radiology   Procedures Procedures (including critical care time)  Medications Ordered in ED Medications  pantoprazole (PROTONIX) 80 mg in sodium chloride 0.9 % 100 mL IVPB (not administered)  pantoprazole (PROTONIX) 80 mg in sodium chloride 0.9 % 250 mL (0.32 mg/mL) infusion (not administered)  pantoprazole (PROTONIX) injection 40 mg (not administered)     Initial Impression / Assessment and Plan / ED Course  I have reviewed the triage vital signs and the nursing notes.  Pertinent labs & imaging results that were available during my care of the patient were reviewed by me and considered in my medical decision making (see chart for details).  MDM Reviewed: previous chart, nursing note and vitals Reviewed previous: labs Interpretation: labs and x-ray   Results for orders placed or performed during the hospital encounter of 09/25/16  Comprehensive metabolic panel  Result Value Ref Range   Sodium 135 135 - 145 mmol/L   Potassium 3.8 3.5 - 5.1 mmol/L   Chloride 98 (L) 101 - 111 mmol/L   CO2 29 22 - 32 mmol/L   Glucose, Bld 111 (H) 65 - 99 mg/dL    BUN 17 6 - 20 mg/dL   Creatinine, Ser 0.78 0.61 - 1.24 mg/dL   Calcium 8.8 (L) 8.9 - 10.3 mg/dL   Total Protein 6.2 (L) 6.5 - 8.1 g/dL   Albumin 3.9 3.5 - 5.0 g/dL   AST 25 15 - 41 U/L   ALT 20 17 - 63 U/L   Alkaline Phosphatase 64 38 - 126 U/L   Total Bilirubin 0.6 0.3 - 1.2 mg/dL   GFR calc non Af Amer >60 >60 mL/min   GFR calc Af Amer >60 >60 mL/min   Anion gap 8 5 - 15  CBC  Result Value Ref Range   WBC 5.7 4.0 - 10.5 K/uL   RBC 3.93 (L) 4.22 - 5.81 MIL/uL   Hemoglobin 12.7 (L) 13.0 - 17.0 g/dL   HCT 37.5 (L) 39.0 - 52.0 %   MCV 95.4 78.0 - 100.0 fL   MCH 32.3 26.0 - 34.0 pg   MCHC 33.9 30.0 - 36.0 g/dL   RDW 13.0 11.5 - 15.5 %   Platelets 150 150 - 400 K/uL  POC occult blood, ED  Result Value Ref Range   Fecal Occult Bld POSITIVE (A) NEGATIVE   Dg Abd Acute W/chest Result Date: 09/25/2016 CLINICAL DATA:  Nausea, vomiting, and blood in stool for 2 days. EXAM: DG ABDOMEN ACUTE W/ 1V CHEST COMPARISON:  None. FINDINGS: Mild generalized gaseous distention of small bowel and colon is seen, suspicious for mild ileus. No evidence of bowel obstruction for free intraperitoneal air. Surgical clips seen from prior cholecystectomy. Surgical clips also seen within the pelvis. Heart size is normal. Tortuosity and atherosclerotic calcification of thoracic aorta noted. Calcified granuloma seen in the medial left lung base. No evidence of pulmonary infiltrate or pleural effusion. IMPRESSION: Suspect mild adynamic ileus.  No evidence of bowel obstruction. No active cardiopulmonary disease.  Aortic atherosclerosis. Electronically Signed   By: Earle Gell M.D.   On: 09/25/2016 18:28   Results for JAQUAYLON, WHITLOCK (MRN [id number]) as of 09/25/2016 18:42  Ref. Range 07/07/2014 12:00 07/11/2015 10:32 05/17/2016 11:23 09/23/2016 18:14 09/25/2016 17:14  Hemoglobin Latest Ref Range: 13.0 - 17.0 g/dL 13.8 (A) 15.1 15.8 13.4 12.7 (L)  HCT Latest Ref Range: 39.0 - 52.0 % 42.1 (A) 43.8 45.8  37.5 (L)    1928:  H/H  as above. Given hx gastric ulcer and recent N/V "with blood," IV protonix bolus and gtt started. VS otherwise stable. Dx and testing d/w pt and family.  Questions answered.  Verb understanding, agreeable to d/c home with outpt f/u. T/C to [facility]. Lorin Mercy, case discussed, including:  HPI, pertinent PM/SHx, VS/PE, dx testing, ED course and treatment:  Agreeable to admit, requests to write temporary orders, obtain medical bed to team APAdmits.   Final Clinical Impressions(s) / ED Diagnoses   Final diagnoses:  None    New Prescriptions New Prescriptions   No medications on file     Francine Graven, DO 09/28/16 1334

## 2016-09-25 NOTE — ED Notes (Addendum)
Labs collected while pt in waiting room.  Attempted to start IV x2. First attempt right posterior forearm. Second attempt right anterior forearm. While get other ER staff to attempt to obtain IV access.  Hemoccult performed by EDP. Hemoccult positive.

## 2016-09-25 NOTE — ED Triage Notes (Signed)
Stool is black.  Denies any pain.

## 2016-09-25 NOTE — ED Notes (Signed)
Patient transported to X-ray 

## 2016-09-25 NOTE — ED Notes (Signed)
AC aware of protonix drip. AC reported would bring dose down as soon as possible.

## 2016-09-26 ENCOUNTER — Encounter (HOSPITAL_COMMUNITY): Payer: Self-pay | Admitting: Gastroenterology

## 2016-09-26 ENCOUNTER — Encounter (HOSPITAL_COMMUNITY)
Admission: EM | Disposition: A | Discharge status: Home / Self Care | Payer: Self-pay | Source: Home / Self Care | Attending: Internal Medicine

## 2016-09-26 DIAGNOSIS — Z8546 Personal history of malignant neoplasm of prostate: Secondary | ICD-10-CM | POA: Diagnosis not present

## 2016-09-26 DIAGNOSIS — D696 Thrombocytopenia, unspecified: Secondary | ICD-10-CM | POA: Diagnosis present

## 2016-09-26 DIAGNOSIS — K254 Chronic or unspecified gastric ulcer with hemorrhage: Principal | ICD-10-CM

## 2016-09-26 DIAGNOSIS — K921 Melena: Secondary | ICD-10-CM

## 2016-09-26 DIAGNOSIS — T39315A Adverse effect of propionic acid derivatives, initial encounter: Secondary | ICD-10-CM | POA: Diagnosis present

## 2016-09-26 DIAGNOSIS — Y92019 Unspecified place in single-family (private) house as the place of occurrence of the external cause: Secondary | ICD-10-CM | POA: Diagnosis not present

## 2016-09-26 DIAGNOSIS — Z77090 Contact with and (suspected) exposure to asbestos: Secondary | ICD-10-CM | POA: Diagnosis not present

## 2016-09-26 DIAGNOSIS — K2970 Gastritis, unspecified, without bleeding: Secondary | ICD-10-CM | POA: Diagnosis present

## 2016-09-26 DIAGNOSIS — Z800 Family history of malignant neoplasm of digestive organs: Secondary | ICD-10-CM | POA: Diagnosis not present

## 2016-09-26 DIAGNOSIS — K3189 Other diseases of stomach and duodenum: Secondary | ICD-10-CM | POA: Diagnosis not present

## 2016-09-26 DIAGNOSIS — Z8711 Personal history of peptic ulcer disease: Secondary | ICD-10-CM | POA: Diagnosis not present

## 2016-09-26 DIAGNOSIS — R739 Hyperglycemia, unspecified: Secondary | ICD-10-CM | POA: Diagnosis present

## 2016-09-26 DIAGNOSIS — E785 Hyperlipidemia, unspecified: Secondary | ICD-10-CM | POA: Diagnosis present

## 2016-09-26 DIAGNOSIS — Z9049 Acquired absence of other specified parts of digestive tract: Secondary | ICD-10-CM | POA: Diagnosis not present

## 2016-09-26 DIAGNOSIS — Z66 Do not resuscitate: Secondary | ICD-10-CM | POA: Diagnosis present

## 2016-09-26 DIAGNOSIS — K922 Gastrointestinal hemorrhage, unspecified: Secondary | ICD-10-CM | POA: Diagnosis not present

## 2016-09-26 DIAGNOSIS — Z79899 Other long term (current) drug therapy: Secondary | ICD-10-CM | POA: Diagnosis not present

## 2016-09-26 DIAGNOSIS — R011 Cardiac murmur, unspecified: Secondary | ICD-10-CM | POA: Diagnosis present

## 2016-09-26 DIAGNOSIS — K319 Disease of stomach and duodenum, unspecified: Secondary | ICD-10-CM | POA: Diagnosis present

## 2016-09-26 DIAGNOSIS — Z8042 Family history of malignant neoplasm of prostate: Secondary | ICD-10-CM | POA: Diagnosis not present

## 2016-09-26 DIAGNOSIS — D62 Acute posthemorrhagic anemia: Secondary | ICD-10-CM | POA: Diagnosis present

## 2016-09-26 HISTORY — PX: ESOPHAGOGASTRODUODENOSCOPY: SHX5428

## 2016-09-26 LAB — BASIC METABOLIC PANEL
Anion gap: 6 (ref 5–15)
BUN: 13 mg/dL (ref 6–20)
CALCIUM: 8.3 mg/dL — AB (ref 8.9–10.3)
CO2: 30 mmol/L (ref 22–32)
CREATININE: 0.78 mg/dL (ref 0.61–1.24)
Chloride: 102 mmol/L (ref 101–111)
GFR calc Af Amer: >60 mL/min (ref >60)
GLUCOSE: 114 mg/dL — AB (ref 65–99)
Potassium: 3.3 mmol/L — ABNORMAL LOW (ref 3.5–5.1)
SODIUM: 138 mmol/L (ref 135–145)

## 2016-09-26 LAB — CBC
HCT: 34.3 % — ABNORMAL LOW (ref 39.0–52.0)
Hemoglobin: 11.7 g/dL — ABNORMAL LOW (ref 13.0–17.0)
MCH: 32.5 pg (ref 26.0–34.0)
MCHC: 34.1 g/dL (ref 30.0–36.0)
MCV: 95.3 fL (ref 78.0–100.0)
PLATELETS: 128 10*3/uL — AB (ref 150–400)
RBC: 3.6 MIL/uL — ABNORMAL LOW (ref 4.22–5.81)
RDW: 13 % (ref 11.5–15.5)
WBC: 4.7 10*3/uL (ref 4.0–10.5)

## 2016-09-26 LAB — PROTIME-INR
INR: 0.98
PROTHROMBIN TIME: 13 s (ref 11.4–15.2)

## 2016-09-26 SURGERY — EGD (ESOPHAGOGASTRODUODENOSCOPY)
Anesthesia: Moderate Sedation

## 2016-09-26 MED ORDER — MEPERIDINE HCL 100 MG/ML IJ SOLN
INTRAMUSCULAR | Status: AC
  Filled 2016-09-26: qty 1

## 2016-09-26 MED ORDER — ONDANSETRON HCL 4 MG/2ML IJ SOLN
INTRAMUSCULAR | Status: AC
  Filled 2016-09-26: qty 2

## 2016-09-26 MED ORDER — LIDOCAINE VISCOUS 2 % MT SOLN
OROMUCOSAL | Status: AC
  Filled 2016-09-26: qty 15

## 2016-09-26 MED ORDER — ONDANSETRON HCL 4 MG/2ML IJ SOLN
INTRAMUSCULAR | Status: DC | PRN
  Administered 2016-09-26: 4 mg via INTRAVENOUS

## 2016-09-26 MED ORDER — MIDAZOLAM HCL 5 MG/5ML IJ SOLN
INTRAMUSCULAR | Status: DC | PRN
  Administered 2016-09-26: 2 mg via INTRAVENOUS

## 2016-09-26 MED ORDER — SODIUM CHLORIDE 0.9 % IV SOLN: INTRAVENOUS | Status: DC

## 2016-09-26 MED ORDER — MIDAZOLAM HCL 5 MG/5ML IJ SOLN
INTRAMUSCULAR | Status: AC
  Filled 2016-09-26: qty 10

## 2016-09-26 MED ORDER — MEPERIDINE HCL 100 MG/ML IJ SOLN
INTRAMUSCULAR | Status: DC | PRN
  Administered 2016-09-26: 25 mg via INTRAVENOUS

## 2016-09-26 MED ORDER — LIDOCAINE VISCOUS 2 % MT SOLN
OROMUCOSAL | Status: DC | PRN
  Administered 2016-09-26: 1 via OROMUCOSAL

## 2016-09-26 MED ORDER — SODIUM CHLORIDE 0.9 % IV SOLN
Freq: Once | INTRAVENOUS | Status: AC
  Administered 2016-09-26: 15:00:00 via INTRAVENOUS

## 2016-09-26 NOTE — Consult Note (Signed)
Referring Provider: Tawni Millers, * Primary Care Physician:  Kenn File, MD Primary Gastroenterologist:  Garfield Cornea, MD (previously Dr. Fuller Plan)  Reason for Consultation:  melena  HPI: Gregory Pearson is a 83 y.o. male with history of prostate cancer, remote gastric ulcer (1995, H. pylori positive status post treatment, documented ulcer healing via follow-up EGD) who presented with 3-4 day history of melena and episode of coffee-ground emesis. When symptoms initially started he did have dizziness and weakness but this resolved after the first 2 days. He discussed findings with his PCP who encouraged him to come to the ER. Patient notes that he started Aleve 1-2 months ago for shoulder pain. He has been taking 1-2 daily. He has also recently been on omeprazole twice a day for "gas". He denies appetite concerns. No unintentional weight loss. Bowel function is regular. No bright red blood per rectum. No abdominal pain, heartburn, dysphagia.    Prior to Admission medications   Medication Sig Start Date End Date Taking? Authorizing Provider  Multiple Vitamin (MULTIVITAMIN) tablet Take 1 tablet by mouth daily.   Yes Historical Provider, MD  omeprazole (PRILOSEC) 20 MG capsule Take 1 capsule (20 mg total) by mouth 2 (two) times daily before a meal. 09/23/16  Yes Fransisca Kaufmann Dettinger, MD    Current Facility-Administered Medications  Medication Dose Route Frequency Provider Last Rate Last Dose  . acetaminophen (TYLENOL) tablet 650 mg  650 mg Oral Q6H PRN Karmen Bongo, MD       Or  . acetaminophen (TYLENOL) suppository 650 mg  650 mg Rectal Q6H PRN Karmen Bongo, MD      . lactated ringers infusion   Intravenous Continuous Karmen Bongo, MD 125 mL/hr at 09/26/16 [id number]    . ondansetron (ZOFRAN) tablet 4 mg  4 mg Oral Q6H PRN Karmen Bongo, MD       Or  . ondansetron [facility]) injection 4 mg  4 mg Intravenous Q6H PRN Karmen Bongo, MD      . pantoprazole (PROTONIX) injection 40 mg  40 mg  Intravenous Q12H Karmen Bongo, MD   40 mg at 09/26/16 0741    Allergies as of 09/25/2016  . (No Known Allergies)    Past Medical History:  Diagnosis Date  . Asbestos exposure   . Blood transfusion without reported diagnosis    after ruptured appendix 1963  . Diverticulosis   . History of stomach ulcers 1995   H.pylori s/p treatment  . Hyperlipidemia   . Muscle strain of right gluteal region 01/24/2014  . Prostate cancer [facility]) 2002   prostatectomy  . Traumatic hematoma of buttock 01/24/2014    Past Surgical History:  Procedure Laterality Date  . APPENDECTOMY  1963   ruptured appendix/gangrene  . COLONOSCOPY  2013   Dr. Fuller Plan: multiple colon polyps (tubular adenomas), diverticulosis. no further surveillance colonoscopies due to age.  . ESOPHAGOGASTRODUODENOSCOPY  [id number]   Dr. Fuller Plan: gastric ulcer (h.pylori + s/p tx), follow up EGD verified ulcer healing  . [place]  . LAPAROSCOPIC CHOLECYSTECTOMY  2003  . PROSTATECTOMY  2002    Family History  Problem Relation Age of Onset  . Prostate cancer Brother   . Prostate cancer Brother   . Stomach cancer Neg Hx   . Colon cancer Neg Hx     Social History   Social History  . Marital status: Married    Spouse name: N/A  . Number of children: N/A  . Years of education: N/A   Occupational History  .  retired Conservation officer, [employer]    Social History Main Topics  . Smoking status: Never Smoker  . Smokeless tobacco: Never Used  . Alcohol use No  . Drug use: No  . Sexual activity: Not on file   Other Topics Concern  . Not on file   Social History Narrative  . No narrative on file     ROS:  General: Negative for anorexia, weight loss, fever, chills, fatigue. See history of present illness.  Eyes: Negative for vision changes.  ENT: Negative for hoarseness, difficulty swallowing , nasal congestion. CV: Negative for chest pain, angina, palpitations, dyspnea on exertion, peripheral edema.  Respiratory: Negative  for dyspnea at rest, dyspnea on exertion, cough, sputum, wheezing.  GI: See history of present illness. GU:  Negative for dysuria, hematuria, urinary incontinence, urinary frequency, nocturnal urination.  MS: See history of present illness. No back pain.   Derm: Negative for rash or itching.  Neuro: Negative for weakness, abnormal sensation, seizure, frequent headaches, memory loss, confusion.  Psych: Negative for anxiety, depression, suicidal ideation, hallucinations.  Endo: Negative for unusual weight change.  Heme: Negative for bruising or bleeding. Allergy: Negative for rash or hives.       Physical Examination: Vital signs in last 24 hours: Temp:  [98 F (36.7 C)-98.6 F (37 C)] 98.6 F (37 C) (02/01 0700) Pulse Rate:  [84-98] 84 (02/01 0700) Resp:  [11-20] 20 (02/01 0700) BP: (110-134)/(66-87) 126/78 (02/01 0700) SpO2:  [97 %-100 %] 98 % (02/01 0700) Weight:  [228 lb 12.8 oz (103.8 kg)-232 lb (105.2 kg)] 228 lb 12.8 oz (103.8 kg) (01/31 2219) Last BM Date: 09/25/16  General: Well-nourished, well-developed in no acute distress.  Head: Normocephalic, atraumatic.   Eyes: Conjunctiva pink, no icterus. Mouth: Oropharyngeal mucosa moist and pink , no lesions erythema or exudate. Neck: Supple without thyromegaly, masses, or lymphadenopathy.  Lungs: Clear to auscultation bilaterally.  Heart: Regular rate and rhythm, no murmurs rubs or gallops.  Abdomen: Bowel sounds are normal, nontender, nondistended, no hepatosplenomegaly or masses, no abdominal bruits or    hernia , no rebound or guarding.   Rectal: Not performed Extremities: No lower extremity edema, clubbing, deformity.  Neuro: Alert and oriented x 4 , grossly normal neurologically.  Skin: Warm and dry, no rash or jaundice.   Psych: Alert and cooperative, normal mood and affect.        Intake/Output from previous day: 01/31 0701 - 02/01 0700 In: -  Out: 750 [Urine:750] Intake/Output this shift: No intake/output data  recorded.  Lab Results: CBC  Recent Labs  09/25/16 1714 09/25/16 2245 09/26/16 0425  WBC 5.7 5.7 4.7  HGB 12.7* 11.9* 11.7*  HCT 37.5* 34.1* 34.3*  MCV 95.4 94.7 95.3  PLT 150 126* 128*   BMET  Recent Labs  09/25/16 1714 09/26/16 0425  NA 135 138  K 3.8 3.3*  CL 98* 102  CO2 29 30  GLUCOSE 111* 114*  BUN 17 13  CREATININE 0.78 0.78  CALCIUM 8.8* 8.3*   LFT  Recent Labs  09/25/16 1714  BILITOT 0.6  ALKPHOS 64  AST 25  ALT 20  PROT 6.2*  ALBUMIN 3.9    Lipase No results for input(s): LIPASE in the last 72 hours.  PT/INR No results for input(s): LABPROT, INR in the last 72 hours.    Imaging Studies: Dg Abd Acute W/chest  Result Date: 09/25/2016 CLINICAL DATA:  Nausea, vomiting, and blood in stool for 2 days. EXAM: DG ABDOMEN ACUTE W/ 1V CHEST  COMPARISON:  None. FINDINGS: Mild generalized gaseous distention of small bowel and colon is seen, suspicious for mild ileus. No evidence of bowel obstruction for free intraperitoneal air. Surgical clips seen from prior cholecystectomy. Surgical clips also seen within the pelvis. Heart size is normal. Tortuosity and atherosclerotic calcification of thoracic aorta noted. Calcified granuloma seen in the medial left lung base. No evidence of pulmonary infiltrate or pleural effusion. IMPRESSION: Suspect mild adynamic ileus.  No evidence of bowel obstruction. No active cardiopulmonary disease.  Aortic atherosclerosis. Electronically Signed   By: Earle Gell M.D.   On: 09/25/2016 18:28  [4 week]   Impression: 83 year old gentleman presenting with likely upper GI bleed with history of melena and coffee-ground emesis which started about 3-4 days ago. It appears that his bleeding has tapered off. His hemoglobin did drop some since admission but stable over the last 12 hours. He has chronic mild thrombocytopenia intermittently for at least past two years. No reasons to suspect cirrhosis at this point. Suspect he has developed peptic  ulcer disease or gastritis related to Aleve. Plan for EGD today for further evaluation.  I have discussed the risks, alternatives, benefits with regards to but not limited to the risk of reaction to medication, bleeding, infection, perforation and the patient is agreeable to proceed. Written consent to be obtained.    Plan: 1. EGD later today. 2. PPI IV BID for now.   We would like to thank you for the opportunity to participate in the care of Duff Halfmann.  Laureen Ochs. Bernarda Caffey [facility] [phone] 2/1/20189:01 AM     LOS: 0 days

## 2016-09-26 NOTE — Op Note (Signed)
[facility] Patient Name: Gregory Pearson Procedure Date: 09/26/2016 3:03 PM MRN: [id number] Date of Birth: April 27, 1928 Attending MD: Norvel Richards , MD CSN: [id number] Age: 83 Admit Type: Inpatient Procedure:                Upper GI endoscopy with gastric biopsy Indications:              Melena Providers:                Norvel Richards, MD, Lurline Del, RN, Isabella Stalling, Technician Referring MD:              Medicines:                Midazolam 2 mg IV, Meperidine 25 mg IV, Ondansetron                            4 mg IV Complications:            No immediate complications. Estimated Blood Loss:     Estimated blood loss was minimal. Procedure:                Pre-Anesthesia Assessment:                           - Prior to the procedure, a History and Physical                            was performed, and patient medications and                            allergies were reviewed. The patient's tolerance of                            previous anesthesia was also reviewed. The risks                            and benefits of the procedure and the sedation                            options and risks were discussed with the patient.                            All questions were answered, and informed consent                            was obtained. Prior Anticoagulants: The patient has                            taken no previous anticoagulant or antiplatelet                            agents. ASA Grade Assessment: II - A patient with  mild systemic disease. After reviewing the risks                            and benefits, the patient was deemed in                            satisfactory condition to undergo the procedure.                           After obtaining informed consent, the endoscope was                            passed under direct vision. Throughout the                            procedure, the patient's blood  pressure, pulse, and                            oxygen saturations were monitored continuously. The                            [id number]) scope was introduced through the                            mouth, and advanced to the second part of duodenum.                            The upper GI endoscopy was accomplished without                            difficulty. The patient tolerated the procedure                            well. Scope In: 3:44:51 PM Scope Out: 3:50:33 PM Total Procedure Duration: 0 hours 5 minutes 42 seconds  Findings:      The examined esophagus was normal.      One cratered gastric ulcer with no stigmata of bleeding was found in the       stomach. The lesion was 12 mm in largest dimension. Appeared benign.       Rectangular in shape.      Also, Multiple erosions were found in the stomach. This was biopsied       with a cold forceps for histology. Estimated blood loss was minimal.      The duodenal bulb and second portion of the duodenum were normal. This       was biopsied with a cold forceps for histology. Impression:               - Normal esophagus.                           - Gastric ulcer with no stigmata of bleeding.                           - Erosive gastropathy. Biopsied.                           -  Normal duodenal bulb and second portion of the                            duodenum. Moderate Sedation:      Moderate (conscious) sedation was administered by the endoscopy nurse       and supervised by the endoscopist. The following parameters were       monitored: oxygen saturation, heart rate, blood pressure, respiratory       rate, EKG, adequacy of pulmonary ventilation, and response to care.       Total physician intraservice time was 11 minutes. Recommendation:           - Return patient to hospital ward for ongoing care.                           - Resume previous diet.                           - Continue present medications. Avoid Aleve and all                             nonsteroidal agents. Protonix 40 mg twice daily -12                            weeks                           - Repeat upper endoscopy in 3 months for                            surveillance.                           - Return to GI clinic in 10 weeks. Procedure Code(s):        --- Professional ---                           [id number], Esophagogastroduodenoscopy, flexible,                            transoral; with biopsy, single or multiple                           99152, Moderate sedation services provided by the                            same physician or other qualified health care                            professional performing the diagnostic or                            therapeutic service that the sedation supports,                            requiring the presence of an independent trained  observer to assist in the monitoring of the                            patient's level of consciousness and physiological                            status; initial 15 minutes of intraservice time,                            patient age 41 years or older Diagnosis Code(s):        --- Professional ---                           K25.9, Gastric ulcer, unspecified as acute or                            chronic, without hemorrhage or perforation                           K31.89, Other diseases of stomach and duodenum                           K92.1, Melena (includes Hematochezia) CPT copyright 2016 American Medical Association. All rights reserved. The codes documented in this report are preliminary and upon coder review may  be revised to meet current compliance requirements. Cristopher Estimable. Rourk, MD Norvel Richards, MD 09/26/2016 4:02:02 PM This report has been signed electronically. Number of Addenda: 0

## 2016-09-26 NOTE — Progress Notes (Signed)
PROGRESS NOTE    Gregory Pearson  [id number] DOB: [birth year] DOA: 09/25/2016 PCP: Kenn File, MD   Brief Narrative:  83 yo male with prostate cancer and history of peptic ulcer disease in the past, who presents with melena for the last 3 to 4 days, worsening and associated with emesis. Has been taking ibuprofen for the last 5 days. On admission found hemodynamic stable, hb and hct stable. Admitted for further workup.    Assessment & Plan:   Principal Problem:   GI bleeding Active Problems:   Hyperglycemia   Melena   1. Upper GI bleed with acute blood loss anemia. Hb and hct have remained stable at 11 and 34, will continue to follow on cell count. Patient for endoscopy this am per GI, will continue proton pump inhibitors. NPO for now and will advance diet per recommendations from GI. Will decrease rate of IV fluids to prevent volume overload.   2. Dyslipidemia. Not on statin therapy, follow as outpatient.   3. Prostate cancer. Follow as outpatient.     DVT prophylaxis: scd  Code Status: full Family Communication: No family at the bedside. Disposition Plan: home   Consultants:   Gastroenterology  Procedures:    Antimicrobials:       Subjective: Patient with no chest pain, no nausea or vomiting, no dyspnea. NPO for endoscopic procedure today.   Objective: Vitals:   09/25/16 2100 09/25/16 2130 09/25/16 2219 09/26/16 0700  BP: 124/80 125/81 134/66 126/78  Pulse: 90 85 92 84  Resp: 18 15 18 20   Temp:   98.1 F (36.7 C) 98.6 F (37 C)  TempSrc:   Oral Oral  SpO2: 98% 98% 97% 98%  Weight:   103.8 kg (228 lb 12.8 oz)   Height:   6' (1.829 m)     Intake/Output Summary (Last 24 hours) at 09/26/16 1016 Last data filed at 09/26/16 0507  Gross per 24 hour  Intake                0 ml  Output              750 ml  Net             -750 ml   Filed Weights   09/25/16 1625 09/25/16 2219  Weight: 105.2 kg (232 lb) 103.8 kg (228 lb 12.8 oz)     Examination:  General exam: Not in pain or dyspnea E ENT: mild pallor and no icterus, oral mucosa moist.  Respiratory system: Clear to auscultation. Respiratory effort normal. No wheezing, rales or rhonchi.  Cardiovascular system: S1 & S2 heard, RRR. No JVD,  rubs, gallops or clicks. No pedal edema. Positive murmur at the apex, systolic, 3/6 at the left sternal border, no radiation.  Gastrointestinal system: Abdomen is nondistended, soft and nontender. No organomegaly or masses felt. Normal bowel sounds heard. Central nervous system: Alert and oriented. No focal neurological deficits. Extremities: Symmetric 5 x 5 power. Skin: No rashes, lesions or ulcers   Data Reviewed: I have personally reviewed following labs and imaging studies  CBC:  Recent Labs Lab 09/25/16 1714 09/25/16 2245 09/26/16 0425  WBC 5.7 5.7 4.7  HGB 12.7* 11.9* 11.7*  HCT 37.5* 34.1* 34.3*  MCV 95.4 94.7 95.3  PLT 150 126* 0000000*   Basic Metabolic Panel:  Recent Labs Lab 09/25/16 1714 09/26/16 0425  NA 135 138  K 3.8 3.3*  CL 98* 102  CO2 29 30  GLUCOSE 111* 114*  BUN 17 13  CREATININE 0.78 0.78  CALCIUM 8.8* 8.3*   GFR: Estimated Creatinine Clearance: 82.6 mL/min (by C-G formula based on SCr of 0.78 mg/dL). Liver Function Tests:  Recent Labs Lab 09/25/16 1714  AST 25  ALT 20  ALKPHOS 64  BILITOT 0.6  PROT 6.2*  ALBUMIN 3.9   No results for input(s): LIPASE, AMYLASE in the last 168 hours. No results for input(s): AMMONIA in the last 168 hours. Coagulation Profile: No results for input(s): INR, PROTIME in the last 168 hours. Cardiac Enzymes: No results for input(s): CKTOTAL, CKMB, CKMBINDEX, TROPONINI in the last 168 hours. BNP (last 3 results) No results for input(s): PROBNP in the last 8760 hours. HbA1C: No results for input(s): HGBA1C in the last 72 hours. CBG: No results for input(s): GLUCAP in the last 168 hours. Lipid Profile: No results for input(s): CHOL, HDL, LDLCALC,  TRIG, CHOLHDL, LDLDIRECT in the last 72 hours. Thyroid Function Tests: No results for input(s): TSH, T4TOTAL, FREET4, T3FREE, THYROIDAB in the last 72 hours. Anemia Panel: No results for input(s): VITAMINB12, FOLATE, FERRITIN, TIBC, IRON, RETICCTPCT in the last 72 hours. Sepsis Labs: No results for input(s): PROCALCITON, LATICACIDVEN in the last 168 hours.  Recent Results (from the past 240 hour(s))  Fecal occult blood, imunochemical     Status: Abnormal   Collection Time: 09/24/16  9:41 AM  Result Value Ref Range Status   Fecal Occult Bld Positive (A) Negative Final         Radiology Studies: Dg Abd Acute W/chest  Result Date: 09/25/2016 CLINICAL DATA:  Nausea, vomiting, and blood in stool for 2 days. EXAM: DG ABDOMEN ACUTE W/ 1V CHEST COMPARISON:  None. FINDINGS: Mild generalized gaseous distention of small bowel and colon is seen, suspicious for mild ileus. No evidence of bowel obstruction for free intraperitoneal air. Surgical clips seen from prior cholecystectomy. Surgical clips also seen within the pelvis. Heart size is normal. Tortuosity and atherosclerotic calcification of thoracic aorta noted. Calcified granuloma seen in the medial left lung base. No evidence of pulmonary infiltrate or pleural effusion. IMPRESSION: Suspect mild adynamic ileus.  No evidence of bowel obstruction. No active cardiopulmonary disease.  Aortic atherosclerosis. Electronically Signed   By: Earle Gell M.D.   On: 09/25/2016 18:28        Scheduled Meds: . pantoprazole (PROTONIX) IV  40 mg Intravenous Q12H   Continuous Infusions: . sodium chloride    . lactated ringers 125 mL/hr at 09/26/16 0514     LOS: 0 days      Tawni Millers, MD [facility] Pager [phone]  If 7PM-7AM, please contact night-coverage [email or web address] Password [email or web address] 09/26/2016, 10:16 AM

## 2016-09-27 ENCOUNTER — Other Ambulatory Visit: Payer: Self-pay | Admitting: *Deleted

## 2016-09-27 ENCOUNTER — Telehealth: Payer: Self-pay | Admitting: Family Medicine

## 2016-09-27 DIAGNOSIS — K922 Gastrointestinal hemorrhage, unspecified: Secondary | ICD-10-CM | POA: Diagnosis present

## 2016-09-27 DIAGNOSIS — Z8719 Personal history of other diseases of the digestive system: Secondary | ICD-10-CM

## 2016-09-27 LAB — BASIC METABOLIC PANEL
ANION GAP: 6 (ref 5–15)
BUN: 12 mg/dL (ref 6–20)
CHLORIDE: 104 mmol/L (ref 101–111)
CO2: 28 mmol/L (ref 22–32)
Calcium: 8.2 mg/dL — ABNORMAL LOW (ref 8.9–10.3)
Creatinine, Ser: 0.82 mg/dL (ref 0.61–1.24)
GFR calc non Af Amer: >60 mL/min (ref >60)
GLUCOSE: 101 mg/dL — AB (ref 65–99)
POTASSIUM: 3.7 mmol/L (ref 3.5–5.1)
Sodium: 138 mmol/L (ref 135–145)

## 2016-09-27 LAB — CBC WITH DIFFERENTIAL/PLATELET
BASOS ABS: 0 10*3/uL (ref 0.0–0.1)
BASOS PCT: 1 %
Eosinophils Absolute: 0.2 10*3/uL (ref 0.0–0.7)
Eosinophils Relative: 5 %
HEMATOCRIT: 33.5 % — AB (ref 39.0–52.0)
HEMOGLOBIN: 11.3 g/dL — AB (ref 13.0–17.0)
LYMPHS PCT: 24 %
Lymphs Abs: 1.1 10*3/uL (ref 0.7–4.0)
MCH: 32.4 pg (ref 26.0–34.0)
MCHC: 33.7 g/dL (ref 30.0–36.0)
MCV: 96 fL (ref 78.0–100.0)
MONO ABS: 0.4 10*3/uL (ref 0.1–1.0)
Monocytes Relative: 9 %
NEUTROS ABS: 2.8 10*3/uL (ref 1.7–7.7)
NEUTROS PCT: 61 %
Platelets: 127 10*3/uL — ABNORMAL LOW (ref 150–400)
RBC: 3.49 MIL/uL — ABNORMAL LOW (ref 4.22–5.81)
RDW: 13.3 % (ref 11.5–15.5)
WBC: 4.5 10*3/uL (ref 4.0–10.5)

## 2016-09-27 MED ORDER — PANTOPRAZOLE SODIUM 40 MG PO TBEC: 40.0000 mg | DELAYED_RELEASE_TABLET | Freq: Two times a day (BID) | ORAL | 2 refills | Status: DC

## 2016-09-27 NOTE — Discharge Summary (Signed)
Physician Discharge Summary  Gregory Pearson [id number] DOB: 08-11-1928 DOA: 09/25/2016  PCP: Kenn File, MD  Admit date: 09/25/2016 Discharge date: 09/27/2016  Time spent: 40 minutes  Recommendations for Outpatient Follow-up:  1. Recommend outpatient follow-up with gastroenterology in 3 months 2. Recommend upper endoscopy follow-up in 3 months with Dr. Gala Romney 3. Recommend cbc in about 2 weeks to 4 weeks   Discharge Diagnoses:  Principal Problem:   GI bleeding Active Problems:   Hyperglycemia   Melena   GI bleed   Discharge Condition: Improved  Diet recommendation: Heart healthy low-salt  Filed Weights   09/25/16 1625 09/25/16 2219  Weight: 105.2 kg (232 lb) 103.8 kg (228 lb 12.8 oz)    History of present illness:  83 year old pleasant male  Remote past a cancer,  History remote stomach ulcer in 1995 admitted with dark stool emesis and has been taking large amounts of Advil a couple of times a day Admitted to the hospital with GI bleed Seen by gastroenterology and ultimately had endoscopic 0 below for reports H and was observed subsequent endoscopy felt to be hemodynamically stable and discharged home with strict instructions for 3 months of EPI and three-month revisit to gastroenterologist for repeat scope Hemoglobin stable this admission in11 range   Procedures     - Normal esophagus.                           - Gastric ulcer with no stigmata of bleeding.                           - Erosive gastropathy. Biopsied.                           - Normal duodenal bulb and second portion of the                            duodenum.  Discharge Exam: Vitals:   09/26/16 1615 09/26/16 2139  BP: 109/73 101/61  Pulse: 72 83  Resp: 16 20  Temp:  98 F (36.7 C)    General: eomi ncat Cardiovascular: s1 s2 no m/r/g Respiratory:  Clear no added sound abd soft nt nd no rebound  Discharge Instructions   Discharge Instructions    Diet - low sodium heart healthy     Complete by:  As directed    Discharge instructions    Complete by:  As directed    Recommend acid reducer [protonix] 40 mg twice a day for 3 months Recommend repeat visit with your stomach Dr Gala Romney in about 3 months time to determine if he needs a further scope Would follow up with her primary physician in about 1 month Recommend that you also stay off of over-the-counter nonsteroidal medications as an out patient.   Increase activity slowly    Complete by:  As directed      Current Discharge Medication List    START taking these medications   Details  pantoprazole (PROTONIX) 40 MG tablet Take 1 tablet (40 mg total) by mouth 2 (two) times daily. Qty: 60 tablet, Refills: 2      CONTINUE these medications which have NOT CHANGED   Details  Multiple Vitamin (MULTIVITAMIN) tablet Take 1 tablet by mouth daily.      STOP taking these medications     omeprazole (PRILOSEC) 20  MG capsule        No Known Allergies    The results of significant diagnostics from this hospitalization (including imaging, microbiology, ancillary and laboratory) are listed below for reference.    Significant Diagnostic Studies: Dg Abd Acute W/chest  Result Date: 09/25/2016 CLINICAL DATA:  Nausea, vomiting, and blood in stool for 2 days. EXAM: DG ABDOMEN ACUTE W/ 1V CHEST COMPARISON:  None. FINDINGS: Mild generalized gaseous distention of small bowel and colon is seen, suspicious for mild ileus. No evidence of bowel obstruction for free intraperitoneal air. Surgical clips seen from prior cholecystectomy. Surgical clips also seen within the pelvis. Heart size is normal. Tortuosity and atherosclerotic calcification of thoracic aorta noted. Calcified granuloma seen in the medial left lung base. No evidence of pulmonary infiltrate or pleural effusion. IMPRESSION: Suspect mild adynamic ileus.  No evidence of bowel obstruction. No active cardiopulmonary disease.  Aortic atherosclerosis. Electronically Signed   By:  Earle Gell M.D.   On: 09/25/2016 18:28    Microbiology: Recent Results (from the past 240 hour(s))  Fecal occult blood, imunochemical     Status: Abnormal   Collection Time: 09/24/16  9:41 AM  Result Value Ref Range Status   Fecal Occult Bld Positive (A) Negative Final     Labs: Basic Metabolic Panel:  Recent Labs Lab 09/25/16 1714 09/26/16 0425 09/27/16 0441  NA 135 138 138  K 3.8 3.3* 3.7  CL 98* 102 104  CO2 29 30 28   GLUCOSE 111* 114* 101*  BUN 17 13 12   CREATININE 0.78 0.78 0.82  CALCIUM 8.8* 8.3* 8.2*   Liver Function Tests:  Recent Labs Lab 09/25/16 1714  AST 25  ALT 20  ALKPHOS 64  BILITOT 0.6  PROT 6.2*  ALBUMIN 3.9   No results for input(s): LIPASE, AMYLASE in the last 168 hours. No results for input(s): AMMONIA in the last 168 hours. CBC:  Recent Labs Lab 09/25/16 1714 09/25/16 2245 09/26/16 0425 09/27/16 0441  WBC 5.7 5.7 4.7 4.5  NEUTROABS  --   --   --  2.8  HGB 12.7* 11.9* 11.7* 11.3*  HCT 37.5* 34.1* 34.3* 33.5*  MCV 95.4 94.7 95.3 96.0  PLT 150 126* 128* 127*   Cardiac Enzymes: No results for input(s): CKTOTAL, CKMB, CKMBINDEX, TROPONINI in the last 168 hours. BNP: BNP (last 3 results) No results for input(s): BNP in the last 8760 hours.  ProBNP (last 3 results) No results for input(s): PROBNP in the last 8760 hours.  CBG: No results for input(s): GLUCAP in the last 168 hours.     SignedNita Sells MD   [facility] 09/27/2016, 9:26 AM

## 2016-09-27 NOTE — Progress Notes (Signed)
09/27/16  1000  Reviewed discharge instructions with patient and family. Patient verbalized understanding of discharge instructions. Copy of discharge instructions given to patient.

## 2016-09-27 NOTE — Telephone Encounter (Signed)
Medication sent to pharmacy  

## 2016-09-27 NOTE — Progress Notes (Signed)
REVIEWED. AGREE. NO ADDITIONAL RECOMMENDATIONS.   Subjective: Doing well, no bowel movement since procedure. Feels better overall. Denies abdominal pain, N/V, chest pain, dizziness, syncope, near syncope. No other GI symptoms.  Objective: Vital signs in last 24 hours: Temp:  [98 F (36.7 C)-98.6 F (37 C)] 98 F (36.7 C) (02/01 2139) Pulse Rate:  [66-83] 83 (02/01 2139) Resp:  [12-20] 20 (02/01 2139) BP: (101-134)/(61-85) 101/61 (02/01 2139) SpO2:  [92 %-100 %] 94 % (02/01 2139) Last BM Date: 09/25/16 General:   Alert and oriented, pleasant, standing in his room. Head:  Normocephalic and atraumatic. Eyes:  No icterus, sclera clear. Conjuctiva pink.  Heart:  S1, S2 present, 3/6 blowing systolic murmur noted.  Lungs: Clear to auscultation bilaterally, without wheezing, rales, or rhonchi.  Abdomen:  Bowel sounds present, soft, non-tender, non-distended. No HSM or hernias noted. No rebound or guarding. Msk:  Symmetrical without gross deformities. Extremities:  Without clubbing or edema. Neurologic:  Alert and  oriented x4;  grossly normal neurologically. Skin:  Warm and dry, intact without significant lesions.  Psych:  Alert and cooperative. Normal mood and affect.  Intake/Output from previous day: 02/01 0701 - 02/02 0700 In: 3327.9 [P.O.:240; I.V.:3087.9] Out: 1000 [Urine:1000] Intake/Output this shift: No intake/output data recorded.  Lab Results:  Recent Labs  09/25/16 2245 09/26/16 0425 09/27/16 0441  WBC 5.7 4.7 4.5  HGB 11.9* 11.7* 11.3*  HCT 34.1* 34.3* 33.5*  PLT 126* 128* 127*   BMET  Recent Labs  09/25/16 1714 09/26/16 0425 09/27/16 0441  NA 135 138 138  K 3.8 3.3* 3.7  CL 98* 102 104  CO2 29 30 28   GLUCOSE 111* 114* 101*  BUN 17 13 12   CREATININE 0.78 0.78 0.82  CALCIUM 8.8* 8.3* 8.2*   LFT  Recent Labs  09/25/16 1714  PROT 6.2*  ALBUMIN 3.9  AST 25  ALT 20  ALKPHOS 64  BILITOT 0.6   PT/INR  Recent Labs  09/26/16 0941  LABPROT  13.0  INR 0.98   Hepatitis Panel No results for input(s): HEPBSAG, HCVAB, HEPAIGM, HEPBIGM in the last 72 hours.   Studies/Results: Dg Abd Acute W/chest  Result Date: 09/25/2016 CLINICAL DATA:  Nausea, vomiting, and blood in stool for 2 days. EXAM: DG ABDOMEN ACUTE W/ 1V CHEST COMPARISON:  None. FINDINGS: Mild generalized gaseous distention of small bowel and colon is seen, suspicious for mild ileus. No evidence of bowel obstruction for free intraperitoneal air. Surgical clips seen from prior cholecystectomy. Surgical clips also seen within the pelvis. Heart size is normal. Tortuosity and atherosclerotic calcification of thoracic aorta noted. Calcified granuloma seen in the medial left lung base. No evidence of pulmonary infiltrate or pleural effusion. IMPRESSION: Suspect mild adynamic ileus.  No evidence of bowel obstruction. No active cardiopulmonary disease.  Aortic atherosclerosis. Electronically Signed   By: Earle Gell M.D.   On: 09/25/2016 18:28    Assessment: 83 year old gentleman presenting with likely upper GI bleed with history of melena and coffee-ground emesis which started about 4-5 days ago. It appears that his bleeding had tapered off. His hemoglobin did drop some since admission but stable over the last 24 hours. He has chronic mild thrombocytopenia intermittently for at least past two years. No reasons to suspect cirrhosis at this point. Suspected he had developed peptic ulcer disease or gastritis related to Aleve and he was referred for EGD. This was completed yesterday and found   Normal esophagus, gastric ulcer without stigmata of bleeding, erosive gastropathy s/p biopsy, normal  duodenum. Recommended resume previous diet, strict avoidance of all NSAIDs, Protonix 40 mg bid x 12 weeks, repeat EGD for surveillance in 3 months. Outpatient follow-up in 10 weeks with out office.  Today his hgb trended down slightly to 11.3 (from 11.7 yesterday). Clinically improved. Hgb stable. No  further BM or noted GI bleed. Feels symptomatically better. Discussed EGD results and plan.   Plan: 1. Strict avoidance of all NSAIDs 2. Continue bid Protonix inpatient and at discharge 3. GI Outpatient follow-up in 10 weeks 4. Follow hgb 5. Monitor for any recurrent bleed 6. Supportive measures   Thank you for allowing [place] to participate in the care of [place], DNP, AGNP-C Adult & Gerontological Nurse Practitioner [facility]     LOS: 1 day    09/27/2016, 8:33 AM

## 2016-09-27 NOTE — Care Management Important Message (Signed)
Important Message  Patient Details  Name: Asad Vanduyne MRN: [id number] Date of Birth: Jul 29, 1928   Medicare Important Message Given:  Yes    Sherald Barge, RN 09/27/2016, 1:13 PM

## 2016-09-30 ENCOUNTER — Encounter (HOSPITAL_COMMUNITY): Payer: Self-pay | Admitting: Internal Medicine

## 2016-09-30 ENCOUNTER — Encounter: Payer: Self-pay | Admitting: Internal Medicine

## 2016-10-01 ENCOUNTER — Ambulatory Visit (INDEPENDENT_AMBULATORY_CARE_PROVIDER_SITE_OTHER): Payer: Medicare Other | Admitting: Family Medicine

## 2016-10-01 ENCOUNTER — Ambulatory Visit: Payer: Medicare Other | Admitting: Family Medicine

## 2016-10-01 ENCOUNTER — Telehealth: Payer: Self-pay

## 2016-10-01 ENCOUNTER — Encounter: Payer: Self-pay | Admitting: Internal Medicine

## 2016-10-01 ENCOUNTER — Encounter: Payer: Self-pay | Admitting: Family Medicine

## 2016-10-01 ENCOUNTER — Encounter: Payer: Self-pay | Admitting: *Deleted

## 2016-10-01 VITALS — BP 114/69 | HR 66 | Temp 97.3°F | Ht 72.0 in | Wt 232.6 lb

## 2016-10-01 DIAGNOSIS — K254 Chronic or unspecified gastric ulcer with hemorrhage: Secondary | ICD-10-CM

## 2016-10-01 NOTE — Telephone Encounter (Signed)
Per RMR-  Letter from: Daneil Dolin  Send letter to patient.  Send copy of letter with path to referring provider and PCP.   Patient should have follow-up appointment in 3 months to set up a repeat EGD

## 2016-10-01 NOTE — Telephone Encounter (Signed)
Letter mailed to the pt. 

## 2016-10-01 NOTE — Progress Notes (Signed)
BP 114/69   Pulse 66   Temp 97.3 F (36.3 C) (Oral)   Ht 6' (1.829 m)   Wt 232 lb 9.6 oz (105.5 kg)   BMI 31.55 kg/m    Subjective:    Patient ID: Gregory Pearson, male    DOB: April 21, 1928, 83 y.o.   MRN: [id number]  HPI: Gregory Pearson is a 83 y.o. male presenting on 10/01/2016 for Hospitalization Follow-up (AP in Jan GI bleed)   HPI GI bleed with ulcer Patient was seen just a week and a half ago in our office and had some dark tarry stools and was Hemoccult positive but had a CBC that was only slightly lowered from normal. He was instructed that if he had any further bleeding that was worse to go to the emergency department and was started on omeprazole. The patient did have further episodes and was still feeling the fatigue and lack of energy and was was admitted to the hospital for GI bleeding. He had an EGD that showed that he had an ulcer and was started on Protonix 40 mg. Eyes any further episodes of bleeding since leaving the hospital but says his energy is still down some. He denies any abdominal pain or diarrhea or constipation or nausea or vomiting.  Relevant past medical, surgical, family and social history reviewed and updated as indicated. Interim medical history since our last visit reviewed. Allergies and medications reviewed and updated.  Review of Systems  Constitutional: Negative for chills and fever.  Respiratory: Negative for shortness of breath and wheezing.   Cardiovascular: Negative for chest pain and leg swelling.  Gastrointestinal: Negative for abdominal pain, blood in stool, constipation, diarrhea, nausea and vomiting.  Musculoskeletal: Negative for back pain and gait problem.  Skin: Negative for rash.  All other systems reviewed and are negative.   Per HPI unless specifically indicated above   Allergies as of 10/01/2016   No Known Allergies     Medication List       Accurate as of 10/01/16  2:32 PM. Always use your most recent med list.            multivitamin tablet Take 1 tablet by mouth daily.   pantoprazole 40 MG tablet Commonly known as:  PROTONIX Take 1 tablet (40 mg total) by mouth 2 (two) times daily.          Objective:    BP 114/69   Pulse 66   Temp 97.3 F (36.3 C) (Oral)   Ht 6' (1.829 m)   Wt 232 lb 9.6 oz (105.5 kg)   BMI 31.55 kg/m   Wt Readings from Last 3 Encounters:  10/01/16 232 lb 9.6 oz (105.5 kg)  09/25/16 228 lb 12.8 oz (103.8 kg)  09/23/16 232 lb (105.2 kg)    Physical Exam  Constitutional: He is oriented to person, place, and time. He appears well-developed and well-nourished. No distress.  Eyes: Conjunctivae are normal. Right eye exhibits no discharge. Left eye exhibits no discharge. No scleral icterus.  Cardiovascular: Normal rate, regular rhythm, normal heart sounds and intact distal pulses.   No murmur heard. Pulmonary/Chest: Effort normal and breath sounds normal. No respiratory distress. He has no wheezes. He has no rales.  Abdominal: Soft. Bowel sounds are normal. He exhibits no distension. There is no tenderness. There is no rebound and no guarding.  Musculoskeletal: Normal range of motion. He exhibits no edema.  Neurological: He is alert and oriented to person, place, and time. Coordination normal.  Skin:  Skin is warm and dry. No rash noted. He is not diaphoretic.  Psychiatric: He has a normal mood and affect. His behavior is normal.  Nursing note and vitals reviewed.   Results for orders placed or performed during the hospital encounter of 09/25/16  Comprehensive metabolic panel  Result Value Ref Range   Sodium 135 135 - 145 mmol/L   Potassium 3.8 3.5 - 5.1 mmol/L   Chloride 98 (L) 101 - 111 mmol/L   CO2 29 22 - 32 mmol/L   Glucose, Bld 111 (H) 65 - 99 mg/dL   BUN 17 6 - 20 mg/dL   Creatinine, Ser 0.78 0.61 - 1.24 mg/dL   Calcium 8.8 (L) 8.9 - 10.3 mg/dL   Total Protein 6.2 (L) 6.5 - 8.1 g/dL   Albumin 3.9 3.5 - 5.0 g/dL   AST 25 15 - 41 U/L   ALT 20 17 - 63 U/L    Alkaline Phosphatase 64 38 - 126 U/L   Total Bilirubin 0.6 0.3 - 1.2 mg/dL   GFR calc non Af Amer >60 >60 mL/min   GFR calc Af Amer >60 >60 mL/min   Anion gap 8 5 - 15  CBC  Result Value Ref Range   WBC 5.7 4.0 - 10.5 K/uL   RBC 3.93 (L) 4.22 - 5.81 MIL/uL   Hemoglobin 12.7 (L) 13.0 - 17.0 g/dL   HCT 37.5 (L) 39.0 - 52.0 %   MCV 95.4 78.0 - 100.0 fL   MCH 32.3 26.0 - 34.0 pg   MCHC 33.9 30.0 - 36.0 g/dL   RDW 13.0 11.5 - 15.5 %   Platelets 150 150 - 400 K/uL  Basic metabolic panel  Result Value Ref Range   Sodium 138 135 - 145 mmol/L   Potassium 3.3 (L) 3.5 - 5.1 mmol/L   Chloride 102 101 - 111 mmol/L   CO2 30 22 - 32 mmol/L   Glucose, Bld 114 (H) 65 - 99 mg/dL   BUN 13 6 - 20 mg/dL   Creatinine, Ser 0.78 0.61 - 1.24 mg/dL   Calcium 8.3 (L) 8.9 - 10.3 mg/dL   GFR calc non Af Amer >60 >60 mL/min   GFR calc Af Amer >60 >60 mL/min   Anion gap 6 5 - 15  CBC  Result Value Ref Range   WBC 4.7 4.0 - 10.5 K/uL   RBC 3.60 (L) 4.22 - 5.81 MIL/uL   Hemoglobin 11.7 (L) 13.0 - 17.0 g/dL   HCT 34.3 (L) 39.0 - 52.0 %   MCV 95.3 78.0 - 100.0 fL   MCH 32.5 26.0 - 34.0 pg   MCHC 34.1 30.0 - 36.0 g/dL   RDW 13.0 11.5 - 15.5 %   Platelets 128 (L) 150 - 400 K/uL  CBC  Result Value Ref Range   WBC 5.7 4.0 - 10.5 K/uL   RBC 3.60 (L) 4.22 - 5.81 MIL/uL   Hemoglobin 11.9 (L) 13.0 - 17.0 g/dL   HCT 34.1 (L) 39.0 - 52.0 %   MCV 94.7 78.0 - 100.0 fL   MCH 33.1 26.0 - 34.0 pg   MCHC 34.9 30.0 - 36.0 g/dL   RDW 12.9 11.5 - 15.5 %   Platelets 126 (L) 150 - 400 K/uL  Protime-INR  Result Value Ref Range   Prothrombin Time 13.0 11.4 - 15.2 seconds   INR 0.98   CBC with Differential/Platelet  Result Value Ref Range   WBC 4.5 4.0 - 10.5 K/uL   RBC 3.49 (L) 4.22 -  5.81 MIL/uL   Hemoglobin 11.3 (L) 13.0 - 17.0 g/dL   HCT 33.5 (L) 39.0 - 52.0 %   MCV 96.0 78.0 - 100.0 fL   MCH 32.4 26.0 - 34.0 pg   MCHC 33.7 30.0 - 36.0 g/dL   RDW 13.3 11.5 - 15.5 %   Platelets 127 (L) 150 - 400 K/uL    Neutrophils Relative % 61 %   Neutro Abs 2.8 1.7 - 7.7 K/uL   Lymphocytes Relative 24 %   Lymphs Abs 1.1 0.7 - 4.0 K/uL   Monocytes Relative 9 %   Monocytes Absolute 0.4 0.1 - 1.0 K/uL   Eosinophils Relative 5 %   Eosinophils Absolute 0.2 0.0 - 0.7 K/uL   Basophils Relative 1 %   Basophils Absolute 0.0 0.0 - 0.1 K/uL  Basic metabolic panel  Result Value Ref Range   Sodium 138 135 - 145 mmol/L   Potassium 3.7 3.5 - 5.1 mmol/L   Chloride 104 101 - 111 mmol/L   CO2 28 22 - 32 mmol/L   Glucose, Bld 101 (H) 65 - 99 mg/dL   BUN 12 6 - 20 mg/dL   Creatinine, Ser 0.82 0.61 - 1.24 mg/dL   Calcium 8.2 (L) 8.9 - 10.3 mg/dL   GFR calc non Af Amer >60 >60 mL/min   GFR calc Af Amer >60 >60 mL/min   Anion gap 6 5 - 15  POC occult blood, ED  Result Value Ref Range   Fecal Occult Bld POSITIVE (A) NEGATIVE  Type and screen [facility]  Result Value Ref Range   ABO/RH(D) O POS    Antibody Screen NEG    Sample Expiration 09/28/2016       Assessment & Plan:   Problem List Items Addressed This Visit      Digestive   GI bleeding - Primary   Relevant Orders   CBC with Differential/Platelet    Has appointment with gastroenterology for follow-up. Return if worsens or comes back.   Follow up plan: Return if symptoms worsen or fail to improve.  Counseling provided for all of the vaccine components Orders Placed This Encounter  Procedures  . CBC with Differential/Platelet    Caryl Pina, MD [facility] 10/01/2016, 2:32 PM

## 2016-10-01 NOTE — Telephone Encounter (Signed)
OV made and letter mailed °

## 2016-10-02 LAB — CBC WITH DIFFERENTIAL/PLATELET
BASOS ABS: 0 10*3/uL (ref 0.0–0.2)
Basos: 1 %
EOS (ABSOLUTE): 0.2 10*3/uL (ref 0.0–0.4)
EOS: 3 %
HEMATOCRIT: 39.6 % (ref 37.5–51.0)
HEMOGLOBIN: 13 g/dL (ref 13.0–17.7)
Immature Grans (Abs): 0 10*3/uL (ref 0.0–0.1)
Immature Granulocytes: 0 %
LYMPHS: 24 %
Lymphocytes Absolute: 1.3 10*3/uL (ref 0.7–3.1)
MCH: 31.7 pg (ref 26.6–33.0)
MCHC: 32.8 g/dL (ref 31.5–35.7)
MCV: 97 fL (ref 79–97)
MONOCYTES: 9 %
Monocytes Absolute: 0.5 10*3/uL (ref 0.1–0.9)
NEUTROS ABS: 3.4 10*3/uL (ref 1.4–7.0)
Neutrophils: 63 %
Platelets: 195 10*3/uL (ref 150–379)
RBC: 4.1 x10E6/uL — ABNORMAL LOW (ref 4.14–5.80)
RDW: 13.7 % (ref 12.3–15.4)
WBC: 5.3 10*3/uL (ref 3.4–10.8)

## 2016-11-08 ENCOUNTER — Telehealth: Payer: Self-pay | Admitting: *Deleted

## 2016-11-08 NOTE — Telephone Encounter (Signed)
Pt needs referral to [facility] for new arch supports Also needs Calcium script sent into [facility] Please advise

## 2016-11-10 NOTE — Telephone Encounter (Signed)
Go ahead and do the referral, diagnosis foot pain/poor arches

## 2016-11-11 ENCOUNTER — Other Ambulatory Visit: Payer: Self-pay | Admitting: *Deleted

## 2016-11-11 ENCOUNTER — Telehealth: Payer: Self-pay | Admitting: Family Medicine

## 2016-11-11 DIAGNOSIS — M79671 Pain in right foot: Secondary | ICD-10-CM

## 2016-11-11 DIAGNOSIS — M79672 Pain in left foot: Secondary | ICD-10-CM

## 2016-11-11 MED ORDER — CALCIUM CARBONATE 1250 (500 CA) MG PO CAPS: 2500.0000 mg | ORAL_CAPSULE | Freq: Two times a day (BID) | ORAL | 5 refills | Status: DC

## 2016-11-11 NOTE — Telephone Encounter (Signed)
Referral to [facility] has been place.  Do not see calcium listed on patient's medication list.  Will need dosage before this office can send to [facility]

## 2016-11-11 NOTE — Telephone Encounter (Signed)
Patient aware that Rx for calcium has been sent to the pharmacy

## 2016-11-11 NOTE — Telephone Encounter (Signed)
Patient wants to know if you can send in calcium sent to [facility] so he can get them for free. He states that you started him on OTC but if he gets rx he can get for free. Please review.

## 2016-11-14 ENCOUNTER — Ambulatory Visit (INDEPENDENT_AMBULATORY_CARE_PROVIDER_SITE_OTHER): Payer: Medicare Other | Admitting: Family Medicine

## 2016-11-14 ENCOUNTER — Encounter: Payer: Self-pay | Admitting: Family Medicine

## 2016-11-14 ENCOUNTER — Other Ambulatory Visit: Payer: Self-pay | Admitting: Family Medicine

## 2016-11-14 ENCOUNTER — Ambulatory Visit (INDEPENDENT_AMBULATORY_CARE_PROVIDER_SITE_OTHER): Payer: Medicare Other

## 2016-11-14 VITALS — BP 126/83 | HR 64 | Temp 97.0°F | Ht 72.0 in | Wt 235.0 lb

## 2016-11-14 DIAGNOSIS — M1712 Unilateral primary osteoarthritis, left knee: Secondary | ICD-10-CM

## 2016-11-14 DIAGNOSIS — M1711 Unilateral primary osteoarthritis, right knee: Secondary | ICD-10-CM

## 2016-11-14 DIAGNOSIS — M170 Bilateral primary osteoarthritis of knee: Secondary | ICD-10-CM

## 2016-11-14 MED ORDER — METHYLPREDNISOLONE ACETATE 80 MG/ML IJ SUSP
80.0000 mg | Freq: Once | INTRAMUSCULAR | Status: AC
  Administered 2016-11-14: 80 mg via INTRAMUSCULAR

## 2016-11-14 NOTE — Progress Notes (Signed)
BP 126/83   Pulse 64   Temp 97 F (36.1 C) (Oral)   Ht 6' (1.829 m)   Wt 235 lb (106.6 kg)   BMI 31.87 kg/m    Subjective:    Patient ID: Gregory Pearson, male    DOB: 1927-09-09, 83 y.o.   MRN: [id number]  HPI: Gregory Pearson is a 83 y.o. male presenting on 11/14/2016 for Knee Pain (bilateral, hurts worse with movement, having a hard time going up steps)   HPI Right knee pain and left knee pain Bilateral knee pain and catching and weakness. He says he has been having some knee pain that is worse when he is going up stairs. It hurts more on the medial aspect of both of his knees but also hurts on the lateral aspect. He feels like there is grinding and popping and catching in both of his legs. He has been having this increasingly over the past year. He denies any fevers or chills or redness or warmth. He does have a little bit of effusion and swelling that he has noticed.  Relevant past medical, surgical, family and social history reviewed and updated as indicated. Interim medical history since our last visit reviewed. Allergies and medications reviewed and updated.  Review of Systems  Constitutional: Negative for chills and fever.  Respiratory: Negative for shortness of breath and wheezing.   Cardiovascular: Negative for chest pain and leg swelling.  Musculoskeletal: Positive for arthralgias and joint swelling. Negative for back pain and gait problem.  Skin: Negative for rash.  All other systems reviewed and are negative.   Per HPI unless specifically indicated above       Objective:    BP 126/83   Pulse 64   Temp 97 F (36.1 C) (Oral)   Ht 6' (1.829 m)   Wt 235 lb (106.6 kg)   BMI 31.87 kg/m   Wt Readings from Last 3 Encounters:  11/14/16 235 lb (106.6 kg)  10/01/16 232 lb 9.6 oz (105.5 kg)  09/25/16 228 lb 12.8 oz (103.8 kg)    Physical Exam  Constitutional: He is oriented to person, place, and time. He appears well-developed and well-nourished. No distress.  Eyes:  Conjunctivae are normal. No scleral icterus.  Cardiovascular: Normal rate, regular rhythm, normal heart sounds and intact distal pulses.   No murmur heard. Pulmonary/Chest: Effort normal and breath sounds normal. No respiratory distress. He has no wheezes.  Musculoskeletal: Normal range of motion. He exhibits no edema.       Right knee: He exhibits normal range of motion, no effusion, no erythema, no LCL laxity, normal meniscus and no MCL laxity. Tenderness found. Medial joint line and lateral joint line tenderness noted.  Neurological: He is alert and oriented to person, place, and time. Coordination normal.  Skin: Skin is warm and dry. No rash noted. He is not diaphoretic.  Psychiatric: He has a normal mood and affect. His behavior is normal.  Nursing note and vitals reviewed.   Knee injection x2: Risk factors of bleeding and infection discussed with patient and patient is agreeable towards injection. Patient prepped with Betadine. Lateral approach towards injection used. Injected 80 mg of Depo-Medrol and 1 mL of 2% lidocaine. Patient tolerated procedure well and no side effects from noted. Minimal to no bleeding. Simple bandage applied after.    Assessment & Plan:   Problem List Items Addressed This Visit    None    Visit Diagnoses    Primary osteoarthritis of right knee    -  Primary   Relevant Medications   methylPREDNISolone acetate (DEPO-MEDROL) injection 80 mg (Start on 11/14/2016 11:00 AM)   methylPREDNISolone acetate (DEPO-MEDROL) injection 80 mg (Start on 11/14/2016 11:00 AM)   Other Relevant Orders   DG Knee 1-2 Views Right   Primary osteoarthritis of left knee       Relevant Medications   methylPREDNISolone acetate (DEPO-MEDROL) injection 80 mg (Start on 11/14/2016 11:00 AM)   methylPREDNISolone acetate (DEPO-MEDROL) injection 80 mg (Start on 11/14/2016 11:00 AM)   Other Relevant Orders   DG Knee 1-2 Views Left       Follow up plan: Return if symptoms worsen or fail to  improve.  Counseling provided for all of the vaccine components Orders Placed This Encounter  Procedures  . DG Knee 1-2 Views Left  . DG Knee 1-2 Views Right    Caryl Pina, MD [facility] 11/14/2016, 10:50 AM

## 2016-11-27 ENCOUNTER — Encounter: Payer: Self-pay | Admitting: Podiatry

## 2016-11-27 ENCOUNTER — Ambulatory Visit (INDEPENDENT_AMBULATORY_CARE_PROVIDER_SITE_OTHER): Payer: Medicare Other | Admitting: Podiatry

## 2016-11-27 DIAGNOSIS — M79671 Pain in right foot: Secondary | ICD-10-CM

## 2016-11-27 DIAGNOSIS — M79672 Pain in left foot: Secondary | ICD-10-CM

## 2016-11-30 NOTE — Progress Notes (Signed)
   Subjective: Patient is an 83 year old male presenting as a new patient requesting new orthotics. He denies having any foot pain but states he needs new orthotics because he last got new ones 30 years ago. He denies any trauma or other complaint.   Objective/Physical Exam General: The patient is alert and oriented x3 in no acute distress.  Dermatology: Skin is warm, dry and supple bilateral lower extremities. Negative for open lesions or macerations.  Vascular: Palpable pedal pulses bilaterally. No edema or erythema noted. Capillary refill within normal limits.  Neurological: Epicritic and protective threshold grossly intact bilaterally.   Musculoskeletal Exam: Range of motion within normal limits to all pedal and ankle joints bilateral. Muscle strength 5/5 in all groups bilateral.   Assessment: #1 no foot problems #2 had old orthotics 30 years ago   Plan of Care:  #1 Patient was evaluated. #2 scanned for custom molded orthotics #3 return to clinic in 3 weeks to pick up orthotics.   Edrick Kins, DPM [facility]  Dr. Edrick Kins, [address]                                        [place], [place] [address]                Office [phone]  Fax [phone]

## 2016-12-18 ENCOUNTER — Other Ambulatory Visit: Payer: Medicare Other

## 2016-12-25 ENCOUNTER — Other Ambulatory Visit: Payer: Self-pay

## 2016-12-25 ENCOUNTER — Encounter: Payer: Self-pay | Admitting: Nurse Practitioner

## 2016-12-25 ENCOUNTER — Ambulatory Visit (INDEPENDENT_AMBULATORY_CARE_PROVIDER_SITE_OTHER): Payer: Medicare Other | Admitting: Nurse Practitioner

## 2016-12-25 VITALS — BP 133/79 | HR 70 | Temp 98.1°F | Ht 72.0 in | Wt 233.4 lb

## 2016-12-25 DIAGNOSIS — K254 Chronic or unspecified gastric ulcer with hemorrhage: Secondary | ICD-10-CM

## 2016-12-25 NOTE — Progress Notes (Signed)
CC'ED TO PCP 

## 2016-12-25 NOTE — Addendum Note (Signed)
Addended by: Gordy Levan, Avonte Sensabaugh A on: 12/25/2016 08:59 AM   Modules accepted: Orders

## 2016-12-25 NOTE — Progress Notes (Signed)
Referring Provider: Timmothy Euler, MD Primary Care Physician:  Fransisca Kaufmann Dettinger, MD Primary GI:  Dr. Gala Romney  Chief Complaint  Patient presents with  . EGD    HPI:   Gregory Pearson is a 83 y.o. male who presents for posthospitalization follow-up related to GI bleed. Patient was admitted 09/25/2016 through 09/27/2016 for GI bleed and melena. Remote history of cancer, remote history of stomach ulcer in 1995 admitted with dark stools and emesis while taking large amounts of Advil a couple times a day. Patient was admitted to the hospital with a GI bleed and seen by our service and ultimately had an endoscopic evaluation. H&H after endoscopy remained stable and the patient was discharged home with recommended 3 month repeat and consideration of repeat endoscopy.  EGD was completed 09/26/2016 which found normal esophagus, gastric ulcer with no stigmata of bleeding, erosive gastropathy status post biopsy, normal duodenum. Surgical pathology found the biopsies to be essentially unremarkable gastric mucosa. Recommended avoid all NSAIDs, Protonix 40 mg twice a day for 12 weeks, repeat upper endoscopy in 3 months for surveillance.  Today he states he's doing well. Denies any further melena or dark stools. Has stopped all NSAIDs, denies ASA powders. Denies abdominal pain, N/V, hematochezia, unintentional weight loss, GERD symptoms. Still on Protonix twice daily. Has a bowel movement once daily, completely empties. Only issue is leg cramps which he plans to address with his PCP. Denies chest pain, dyspnea, dizziness, lightheadedness, syncope, near syncope. Denies any other upper or lower GI symptoms.  Past Medical History:  Diagnosis Date  . Asbestos exposure   . Blood transfusion without reported diagnosis    after ruptured appendix 1963  . Diverticulosis   . GERD (gastroesophageal reflux disease)   . History of stomach ulcers 1995   H.pylori s/p treatment  . Hyperlipidemia   . Muscle strain  of right gluteal region 01/24/2014  . Prostate cancer [facility]) 2002   prostatectomy  . Traumatic hematoma of buttock 01/24/2014    Past Surgical History:  Procedure Laterality Date  . APPENDECTOMY  1963   ruptured appendix/gangrene  . COLONOSCOPY  2013   Dr. Fuller Plan: multiple colon polyps (tubular adenomas), diverticulosis. no further surveillance colonoscopies due to age.  . ESOPHAGOGASTRODUODENOSCOPY  [id number]   Dr. Fuller Plan: gastric ulcer (h.pylori + s/p tx), follow up EGD verified ulcer healing  . ESOPHAGOGASTRODUODENOSCOPY N/A 09/26/2016   Procedure: ESOPHAGOGASTRODUODENOSCOPY (EGD);  Surgeon: Daneil Dolin, MD;  Location: [facility];  Service: Endoscopy;  Laterality: N/A;  . [place]  . LAPAROSCOPIC CHOLECYSTECTOMY  2003  . PROSTATECTOMY  2002    Current Outpatient Prescriptions  Medication Sig Dispense Refill  . calcium carbonate (CALCI-MIX) 1250 MG capsule Take 2 capsules (2,500 mg total) by mouth 2 (two) times daily with a meal. 360 capsule 5  . Multiple Vitamin (MULTIVITAMIN) tablet Take 1 tablet by mouth daily.    . pantoprazole (PROTONIX) 40 MG tablet Take 40 mg by mouth daily.      No current facility-administered medications for this visit.     Allergies as of 12/25/2016  . (No Known Allergies)    Family History  Problem Relation Age of Onset  . Prostate cancer Brother   . Prostate cancer Brother   . Stomach cancer Neg Hx   . Colon cancer Neg Hx     Social History   Social History  . Marital status: Married    Spouse name: N/A  . Number of children: N/A  .  Years of education: N/A   Occupational History  . retired Conservation officer, historic buildings [employer]    Social History Main Topics  . Smoking status: Never Smoker  . Smokeless tobacco: Never Used  . Alcohol use No  . Drug use: No  . Sexual activity: Not Asked   Other Topics Concern  . None   Social History Narrative  . None    Review of Systems: General: Negative for anorexia, weight loss, fever, chills,  fatigue, weakness. ENT: Negative for hoarseness, difficulty swallowing. CV: Negative for chest pain, angina, palpitations, peripheral edema.  Respiratory: Negative for dyspnea at rest, cough, sputum, wheezing.  GI: See history of present illness. MS: Admits joint pain in his knees, occasional leg cramps (will address with PCP tomorrow).  Endo: Negative for unusual weight change.  Heme: Negative for bruising or bleeding.   Physical Exam: BP 133/79   Pulse 70   Temp 98.1 F (36.7 C) (Oral)   Ht 6' (1.829 m)   Wt 233 lb 6.4 oz (105.9 kg)   BMI 31.65 kg/m  General:   Alert and oriented. Pleasant and cooperative. Well-nourished and well-developed.  Head:  Normocephalic and atraumatic. Eyes:  Without icterus, sclera clear and conjunctiva pink.  Ears:  Hard of hearing, hearing aids in place. Cardiovascular:  S1, S2 present without murmurs appreciated. Extremities without clubbing or edema. Respiratory:  Clear to auscultation bilaterally. No wheezes, rales, or rhonchi. No distress.  Gastrointestinal:  +BS, soft, non-tender and non-distended. No HSM noted. No guarding or rebound. No masses appreciated.  Rectal:  Deferred  Musculoskalatal:  Symmetrical without gross deformities. Neurologic:  Alert and oriented x4;  grossly normal neurologically. Psych:  Alert and cooperative. Normal mood and affect. Heme/Lymph/Immune: No excessive bruising noted.    12/25/2016 8:57 AM   Disclaimer: This note was dictated with voice recognition software. Similar sounding words can inadvertently be transcribed and may not be corrected upon review.

## 2016-12-25 NOTE — Patient Instructions (Signed)
1. Have your blood work done when you're able to.  2. Continue taking Protonix twice a day until we can complete the endoscopy. 3. We will schedule your endoscopy for you. 4. Further recommendations to be made based on the results of your endoscopy. 5. Return for follow-up based on post endoscopy recommendations. 6. Call us if you have any significant or worsening symptoms including recurrent bleeding, dark stools, vomiting coffee ground type material, or other concerning symptoms.

## 2016-12-25 NOTE — Assessment & Plan Note (Signed)
GI bleeding due to gastric ulcer on endoscopy during hospitalization. Denies any further dark stools or hematochezia. Generally he is asymptomatic from a GI standpoint. I will recheck a CBC today to trend his hemoglobin. He stopped taking all NSAIDs is recommended. We will schedule surveillance three-month exam for endoscopy to evaluate for ulcer healing. I advise him to stay on Protonix twice a day until the EGD can be completed to confirm healing of his ulcer at which point it will likely be recommended to cut back. Return for follow-up based on postprocedure recommendations. Call if any questions.

## 2016-12-26 ENCOUNTER — Other Ambulatory Visit: Payer: Self-pay

## 2016-12-26 ENCOUNTER — Telehealth: Payer: Self-pay | Admitting: Pharmacist

## 2016-12-26 ENCOUNTER — Other Ambulatory Visit: Payer: Medicare Other

## 2016-12-26 DIAGNOSIS — K254 Chronic or unspecified gastric ulcer with hemorrhage: Secondary | ICD-10-CM | POA: Diagnosis not present

## 2016-12-27 LAB — CBC WITH DIFFERENTIAL/PLATELET
BASOS ABS: 0 10*3/uL (ref 0.0–0.2)
BASOS: 1 %
EOS (ABSOLUTE): 0.1 10*3/uL (ref 0.0–0.4)
Eos: 2 %
Hematocrit: 44.3 % (ref 37.5–51.0)
Hemoglobin: 14.2 g/dL (ref 13.0–17.7)
Immature Grans (Abs): 0 10*3/uL (ref 0.0–0.1)
Immature Granulocytes: 0 %
LYMPHS: 23 %
Lymphocytes Absolute: 1.5 10*3/uL (ref 0.7–3.1)
MCH: 28.4 pg (ref 26.6–33.0)
MCHC: 32.1 g/dL (ref 31.5–35.7)
MCV: 89 fL (ref 79–97)
MONOS ABS: 0.6 10*3/uL (ref 0.1–0.9)
Monocytes: 9 %
Neutrophils Absolute: 4.2 10*3/uL (ref 1.4–7.0)
Neutrophils: 65 %
PLATELETS: 177 10*3/uL (ref 150–379)
RBC: 5 x10E6/uL (ref 4.14–5.80)
RDW: 14.5 % (ref 12.3–15.4)
WBC: 6.5 10*3/uL (ref 3.4–10.8)

## 2016-12-29 NOTE — Telephone Encounter (Signed)
Recommend referral to orthopedic

## 2016-12-30 NOTE — Telephone Encounter (Signed)
He has a lot of weakness in legs .  He exercises to try to strengthen but still has weakness.  Patient prefers to wait and discuss this again with provider at next visit instead of doing a referral to orthopedics.

## 2017-01-23 ENCOUNTER — Encounter (HOSPITAL_COMMUNITY)
Admission: RE | Disposition: A | Discharge status: Home / Self Care | Payer: Self-pay | Source: Ambulatory Visit | Attending: Internal Medicine

## 2017-01-23 ENCOUNTER — Encounter (HOSPITAL_COMMUNITY): Payer: Self-pay

## 2017-01-23 ENCOUNTER — Ambulatory Visit (HOSPITAL_COMMUNITY)
Admission: RE | Admit: 2017-01-23 | Discharge: 2017-01-23 | Disposition: A | Discharge status: Home / Self Care | Payer: Medicare Other | Source: Ambulatory Visit | Attending: Internal Medicine | Admitting: Internal Medicine

## 2017-01-23 DIAGNOSIS — K3189 Other diseases of stomach and duodenum: Secondary | ICD-10-CM

## 2017-01-23 DIAGNOSIS — Z09 Encounter for follow-up examination after completed treatment for conditions other than malignant neoplasm: Secondary | ICD-10-CM | POA: Insufficient documentation

## 2017-01-23 DIAGNOSIS — Z8546 Personal history of malignant neoplasm of prostate: Secondary | ICD-10-CM | POA: Insufficient documentation

## 2017-01-23 DIAGNOSIS — Z8719 Personal history of other diseases of the digestive system: Secondary | ICD-10-CM | POA: Diagnosis not present

## 2017-01-23 DIAGNOSIS — Z8711 Personal history of peptic ulcer disease: Secondary | ICD-10-CM | POA: Insufficient documentation

## 2017-01-23 DIAGNOSIS — K254 Chronic or unspecified gastric ulcer with hemorrhage: Secondary | ICD-10-CM

## 2017-01-23 DIAGNOSIS — K219 Gastro-esophageal reflux disease without esophagitis: Secondary | ICD-10-CM | POA: Diagnosis not present

## 2017-01-23 HISTORY — PX: ESOPHAGOGASTRODUODENOSCOPY: SHX5428

## 2017-01-23 SURGERY — EGD (ESOPHAGOGASTRODUODENOSCOPY)
Anesthesia: Moderate Sedation

## 2017-01-23 MED ORDER — ONDANSETRON HCL 4 MG/2ML IJ SOLN
INTRAMUSCULAR | Status: DC | PRN
  Administered 2017-01-23: 4 mg via INTRAVENOUS

## 2017-01-23 MED ORDER — MIDAZOLAM HCL 5 MG/5ML IJ SOLN
INTRAMUSCULAR | Status: DC | PRN
  Administered 2017-01-23: 2 mg via INTRAVENOUS
  Administered 2017-01-23: 1 mg via INTRAVENOUS

## 2017-01-23 MED ORDER — PANTOPRAZOLE SODIUM 40 MG PO TBEC: 40.0000 mg | DELAYED_RELEASE_TABLET | Freq: Every day | ORAL | 11 refills | Status: DC

## 2017-01-23 MED ORDER — SIMETHICONE 40 MG/0.6ML PO SUSP
ORAL | Status: DC | PRN
  Administered 2017-01-23: 11:00:00

## 2017-01-23 MED ORDER — LIDOCAINE VISCOUS 2 % MT SOLN
OROMUCOSAL | Status: AC
  Filled 2017-01-23: qty 15

## 2017-01-23 MED ORDER — ONDANSETRON HCL 4 MG/2ML IJ SOLN
INTRAMUSCULAR | Status: AC
  Filled 2017-01-23: qty 2

## 2017-01-23 MED ORDER — SODIUM CHLORIDE 0.9 % IV SOLN
INTRAVENOUS | Status: DC
  Administered 2017-01-23: 1000 mL via INTRAVENOUS

## 2017-01-23 MED ORDER — MEPERIDINE HCL 100 MG/ML IJ SOLN
INTRAMUSCULAR | Status: AC
  Filled 2017-01-23: qty 2

## 2017-01-23 MED ORDER — MIDAZOLAM HCL 5 MG/5ML IJ SOLN
INTRAMUSCULAR | Status: AC
  Filled 2017-01-23: qty 10

## 2017-01-23 MED ORDER — MEPERIDINE HCL 100 MG/ML IJ SOLN
INTRAMUSCULAR | Status: DC | PRN
  Administered 2017-01-23 (×2): 25 mg via INTRAVENOUS

## 2017-01-23 NOTE — Op Note (Signed)
[facility] Patient Name: Gregory Pearson Procedure Date: 01/23/2017 10:43 AM MRN: [id number] Date of Birth: 1927/10/25 Attending MD: Norvel Richards , MD CSN: [id number] Age: 83 Admit Type: Outpatient Procedure:                Upper GI endoscopy Indications:              Surveillance procedure Providers:                Norvel Richards, MD, Hinton Rao, RN, Aram Candela Referring MD:              Medicines:                Midazolam 3 mg IV, Meperidine 50 mg IV, Ondansetron                            4 mg IV Complications:            No immediate complications. Estimated Blood Loss:     Estimated blood loss: none. Estimated blood loss                            was minimal. Procedure:                Pre-Anesthesia Assessment:                           - Prior to the procedure, a History and Physical                            was performed, and patient medications and                            allergies were reviewed. The patient's tolerance of                            previous anesthesia was also reviewed. The risks                            and benefits of the procedure and the sedation                            options and risks were discussed with the patient.                            All questions were answered, and informed consent                            was obtained. Prior Anticoagulants: The patient has                            taken no previous anticoagulant or antiplatelet                            agents.  ASA Grade Assessment: III - A patient with                            severe systemic disease. After reviewing the risks                            and benefits, the patient was deemed in                            satisfactory condition to undergo the procedure.                           After obtaining informed consent, the endoscope was                            passed under direct vision. Throughout the                   procedure, the patient's blood pressure, pulse, and                            oxygen saturations were monitored continuously. The                            EG-[id number] ([id number]) scope was introduced through the                            mouth, and advanced to the second part of duodenum.                            The upper GI endoscopy was accomplished without                            difficulty. The patient tolerated the procedure                            well. Scope In: 11:27:06 AM Scope Out: 11:29:58 AM Total Procedure Duration: 0 hours 2 minutes 52 seconds  Findings:      The examined esophagus was normal.      Normal mucosa was found in the stomach (scar at site of previously noted       gastric ulcer has healed). Normal-appearing duodenum.      [Extent] [Severity] mucosal changes characterized by scarring (post       ulcer) were found in the stomach. Impression:               Previously noted gastric ulcer has healed                           - Normal esophagus.                           - Normal duodenal bulb and second portion of the                            duodenum.                           -                           -  Normal mucosa was found in the stomach.                           - Scarred (post ulcer) mucosa in the stomach.                           - No specimens collected. Moderate Sedation:      Moderate (conscious) sedation was administered by the endoscopy nurse       and supervised by the endoscopist. The following parameters were       monitored: oxygen saturation, heart rate, blood pressure, respiratory       rate, EKG, adequacy of pulmonary ventilation, and response to care.       Total physician intraservice time was 9 minutes. Recommendation:           - Patient has a contact number available for                            emergencies. The signs and symptoms of potential                            delayed complications were discussed  with the                            patient. Return to normal activities tomorrow.                            Written discharge instructions were provided to the                            patient.                           - Resume previous diet.                           - Continue present medications. May decrease                            Protonix to 40 mg once daily. Continue to avoid all                            nonsteroidal agent.                           - No repeat upper endoscopy.                           - Return to GI office in 6 months.                           - Patient has a contact number available for                            emergencies. The signs and symptoms of potential  delayed complications were discussed with the                            patient. Return to normal activities tomorrow.                            Written discharge instructions were provided to the                            patient.                           - Resume previous diet. Procedure Code(s):        --- Professional ---                           [phone], Esophagogastroduodenoscopy, flexible,                            transoral; diagnostic, including collection of                            specimen(s) by brushing or washing, when performed                            (separate procedure) Diagnosis Code(s):        --- Professional ---                           K31.89, Other diseases of stomach and duodenum CPT copyright 2016 American Medical Association. All rights reserved. The codes documented in this report are preliminary and upon coder review may  be revised to meet current compliance requirements. Cristopher Estimable. Seirra Kos, MD Norvel Richards, MD 01/23/2017 2:29:37 PM This report has been signed electronically. Number of Addenda: 0

## 2017-01-23 NOTE — Interval H&P Note (Signed)
History and Physical Interval Note:  01/23/2017 11:16 AM  Gregory Pearson  has presented today for surgery, with the diagnosis of ulcer  The various methods of treatment have been discussed with the patient and family. After consideration of risks, benefits and other options for treatment, the patient has consented to  Procedure(s) with comments: ESOPHAGOGASTRODUODENOSCOPY (EGD) (N/A) - 12:15pm as a surgical intervention .  The patient's history has been reviewed, patient examined, no change in status, stable for surgery.  I have reviewed the patient's chart and labs.  Questions were answered to the patient's satisfaction.     Robert Rourk  No change. Patient avoiding NSAIDs. No dysphagia. Follow-up EGD per plan.  The risks, benefits, limitations, alternatives and imponderables have been reviewed with the patient. Potential for esophageal dilation, biopsy, etc. have also been reviewed.  Questions have been answered. All parties agreeable.

## 2017-01-23 NOTE — Discharge Instructions (Addendum)
EGD Discharge instructions Please read the instructions outlined below and refer to this sheet in the next few weeks. These discharge instructions provide you with general information on caring for yourself after you leave the hospital. Your doctor may also give you specific instructions. While your treatment has been planned according to the most current medical practices available, unavoidable complications occasionally occur. If you have any problems or questions after discharge, please call your doctor.  Dr Gala Romney:  [phone]  (after hours call hospital and ask for GI doctor on call to be paged) ACTIVITY  You may resume your regular activity but move at a slower pace for the next 24 hours.   Take frequent rest periods for the next 24 hours.   Walking will help expel (get rid of) the air and reduce the bloated feeling in your abdomen.   No driving for 24 hours (because of the anesthesia (medicine) used during the test).   You may shower.   Do not sign any important legal documents or operate any machinery for 24 hours (because of the anesthesia used during the test).  NUTRITION  Drink plenty of fluids.   You may resume your normal diet.   Begin with a light meal and progress to your normal diet.   Avoid alcoholic beverages for 24 hours or as instructed by your caregiver.  MEDICATIONS  You may resume your normal medications unless your caregiver tells you otherwise.  WHAT YOU CAN EXPECT TODAY  You may experience abdominal discomfort such as a feeling of fullness or gas pains.  FOLLOW-UP  Your doctor will discuss the results of your test with you.  SEEK IMMEDIATE MEDICAL ATTENTION IF ANY OF THE FOLLOWING OCCUR:  Excessive nausea (feeling sick to your stomach) and/or vomiting.   Severe abdominal pain and distention (swelling).   Trouble swallowing.   Temperature over 101 F (37.8 C).   Rectal bleeding or vomiting of blood.    Continue to avoid all nonsteroidal  agents like Advil and Motrin  May decrease Protonix to 40 mg once daily  Office visit with [place] in 6 months.

## 2017-01-23 NOTE — H&P (View-Only) (Signed)
Referring Provider: Timmothy Euler, MD Primary Care Physician:  Fransisca Kaufmann Dettinger, MD Primary GI:  Dr. Gala Romney  Chief Complaint  Patient presents with  . EGD    HPI:   Gregory Pearson is a 83 y.o. male who presents for posthospitalization follow-up related to GI bleed. Patient was admitted 09/25/2016 through 09/27/2016 for GI bleed and melena. Remote history of cancer, remote history of stomach ulcer in 1995 admitted with dark stools and emesis while taking large amounts of Advil a couple times a day. Patient was admitted to the hospital with a GI bleed and seen by our service and ultimately had an endoscopic evaluation. H&H after endoscopy remained stable and the patient was discharged home with recommended 3 month repeat and consideration of repeat endoscopy.  EGD was completed 09/26/2016 which found normal esophagus, gastric ulcer with no stigmata of bleeding, erosive gastropathy status post biopsy, normal duodenum. Surgical pathology found the biopsies to be essentially unremarkable gastric mucosa. Recommended avoid all NSAIDs, Protonix 40 mg twice a day for 12 weeks, repeat upper endoscopy in 3 months for surveillance.  Today he states he's doing well. Denies any further melena or dark stools. Has stopped all NSAIDs, denies ASA powders. Denies abdominal pain, N/V, hematochezia, unintentional weight loss, GERD symptoms. Still on Protonix twice daily. Has a bowel movement once daily, completely empties. Only issue is leg cramps which he plans to address with his PCP. Denies chest pain, dyspnea, dizziness, lightheadedness, syncope, near syncope. Denies any other upper or lower GI symptoms.  Past Medical History:  Diagnosis Date  . Asbestos exposure   . Blood transfusion without reported diagnosis    after ruptured appendix 1963  . Diverticulosis   . GERD (gastroesophageal reflux disease)   . History of stomach ulcers 1995   H.pylori s/p treatment  . Hyperlipidemia   . Muscle strain  of right gluteal region 01/24/2014  . Prostate cancer [facility]) 2002   prostatectomy  . Traumatic hematoma of buttock 01/24/2014    Past Surgical History:  Procedure Laterality Date  . APPENDECTOMY  1963   ruptured appendix/gangrene  . COLONOSCOPY  2013   Dr. Fuller Plan: multiple colon polyps (tubular adenomas), diverticulosis. no further surveillance colonoscopies due to age.  . ESOPHAGOGASTRODUODENOSCOPY  4010,[id number]   Dr. Fuller Plan: gastric ulcer (h.pylori + s/p tx), follow up EGD verified ulcer healing  . ESOPHAGOGASTRODUODENOSCOPY N/A 09/26/2016   Procedure: ESOPHAGOGASTRODUODENOSCOPY (EGD);  Surgeon: Daneil Dolin, MD;  Location: [facility];  Service: Endoscopy;  Laterality: N/A;  . [place]  . LAPAROSCOPIC CHOLECYSTECTOMY  2003  . PROSTATECTOMY  2002    Current Outpatient Prescriptions  Medication Sig Dispense Refill  . calcium carbonate (CALCI-MIX) 1250 MG capsule Take 2 capsules (2,500 mg total) by mouth 2 (two) times daily with a meal. 360 capsule 5  . Multiple Vitamin (MULTIVITAMIN) tablet Take 1 tablet by mouth daily.    . pantoprazole (PROTONIX) 40 MG tablet Take 40 mg by mouth daily.      No current facility-administered medications for this visit.     Allergies as of 12/25/2016  . (No Known Allergies)    Family History  Problem Relation Age of Onset  . Prostate cancer Brother   . Prostate cancer Brother   . Stomach cancer Neg Hx   . Colon cancer Neg Hx     Social History   Social History  . Marital status: Married    Spouse name: N/A  . Number of children: N/A  .  Years of education: N/A   Occupational History  . retired Conservation officer, historic buildings [employer]    Social History Main Topics  . Smoking status: Never Smoker  . Smokeless tobacco: Never Used  . Alcohol use No  . Drug use: No  . Sexual activity: Not Asked   Other Topics Concern  . None   Social History Narrative  . None    Review of Systems: General: Negative for anorexia, weight loss, fever, chills,  fatigue, weakness. ENT: Negative for hoarseness, difficulty swallowing. CV: Negative for chest pain, angina, palpitations, peripheral edema.  Respiratory: Negative for dyspnea at rest, cough, sputum, wheezing.  GI: See history of present illness. MS: Admits joint pain in his knees, occasional leg cramps (will address with PCP tomorrow).  Endo: Negative for unusual weight change.  Heme: Negative for bruising or bleeding.   Physical Exam: BP 133/79   Pearson 70   Temp 98.1 F (36.7 C) (Oral)   Ht 6' (1.829 m)   Wt 233 lb 6.4 oz (105.9 kg)   BMI 31.65 kg/m  General:   Alert and oriented. Pleasant and cooperative. Well-nourished and well-developed.  Head:  Normocephalic and atraumatic. Eyes:  Without icterus, sclera clear and conjunctiva pink.  Ears:  Hard of hearing, hearing aids in place. Cardiovascular:  S1, S2 present without murmurs appreciated. Extremities without clubbing or edema. Respiratory:  Clear to auscultation bilaterally. No wheezes, rales, or rhonchi. No distress.  Gastrointestinal:  +BS, soft, non-tender and non-distended. No HSM noted. No guarding or rebound. No masses appreciated.  Rectal:  Deferred  Musculoskalatal:  Symmetrical without gross deformities. Neurologic:  Alert and oriented x4;  grossly normal neurologically. Psych:  Alert and cooperative. Normal mood and affect. Heme/Lymph/Immune: No excessive bruising noted.    12/25/2016 8:57 AM   Disclaimer: This note was dictated with voice recognition software. Similar sounding words can inadvertently be transcribed and may not be corrected upon review.

## 2017-01-24 ENCOUNTER — Encounter (HOSPITAL_COMMUNITY): Payer: Self-pay | Admitting: Internal Medicine

## 2017-03-13 DIAGNOSIS — D239 Other benign neoplasm of skin, unspecified: Secondary | ICD-10-CM | POA: Diagnosis not present

## 2017-03-13 DIAGNOSIS — L814 Other melanin hyperpigmentation: Secondary | ICD-10-CM | POA: Diagnosis not present

## 2017-03-13 DIAGNOSIS — L821 Other seborrheic keratosis: Secondary | ICD-10-CM | POA: Diagnosis not present

## 2017-03-13 DIAGNOSIS — R58 Hemorrhage, not elsewhere classified: Secondary | ICD-10-CM | POA: Diagnosis not present

## 2017-05-19 ENCOUNTER — Ambulatory Visit (INDEPENDENT_AMBULATORY_CARE_PROVIDER_SITE_OTHER): Payer: Medicare Other | Admitting: Family Medicine

## 2017-05-19 ENCOUNTER — Encounter: Payer: Self-pay | Admitting: Family Medicine

## 2017-05-19 VITALS — BP 139/85 | HR 82 | Temp 97.1°F | Ht 72.0 in | Wt 236.0 lb

## 2017-05-19 DIAGNOSIS — Z0000 Encounter for general adult medical examination without abnormal findings: Secondary | ICD-10-CM

## 2017-05-19 DIAGNOSIS — Z23 Encounter for immunization: Secondary | ICD-10-CM

## 2017-05-19 NOTE — Progress Notes (Signed)
Subjective:    Gregory Pearson is a 83 y.o. male who presents for Medicare Annual/Subsequent preventive examination.   Gregory Pearson is a pleasant 83 year old male who resides at home with his wife. He reports that she has some memory deficits, and relies on him for iADLs and some ADLs. He reports that he is actually made attempts to safety proof the house, including installing hardwood floors to reduce risk for falls. He denies any falls himself in the last year. He was hospitalized at [facility] in January of this year for a bleeding ulcer. He had an EGD done in May of this year that noted his ulcerations have healed. He plans to see the gastroenterologist again in January. He voices no other concerns at this time except for intermittent arthritis in his thumbs and right knee. He reports that he has been using the device at home to help with stretching of his low back which has helped substantially in his right knee.  Preventive Screening-Counseling & Management  Tobacco History  Smoking Status  . Never Smoker  Smokeless Tobacco  . Never Used   Occ social ETOH use.  Problems Prior to Visit  Current Problems (verified) Patient Active Problem List   Diagnosis Date Noted  . GI bleed 09/27/2016  . Gastrointestinal hemorrhage   . Melena   . GI bleeding 09/25/2016  . Hyperglycemia 09/25/2016  . Venous stasis 05/17/2016  . Healthcare maintenance 07/11/2015  . Hyperlipidemia 07/11/2015  . Muscle strain of right gluteal region 01/24/2014  . Traumatic hematoma of buttock 01/24/2014  . Thrombocytopenia ([place]) 01/24/2014  . Hip injury 01/23/2014  . Femur fracture, right ([place]) 01/23/2014    Medications Prior to Visit Current Outpatient Prescriptions on File Prior to Visit  Medication Sig Dispense Refill  . Multiple Vitamin (MULTIVITAMIN) tablet Take 1 tablet by mouth daily.    . pantoprazole (PROTONIX) 40 MG tablet Take 1 tablet (40 mg total) by mouth daily. 30 tablet 11   No  current facility-administered medications on file prior to visit.     Current Medications (verified) Current Outpatient Prescriptions  Medication Sig Dispense Refill  . Multiple Vitamin (MULTIVITAMIN) tablet Take 1 tablet by mouth daily.    . pantoprazole (PROTONIX) 40 MG tablet Take 1 tablet (40 mg total) by mouth daily. 30 tablet 11   No current facility-administered medications for this visit.      Allergies (verified) Patient has no known allergies.   PAST HISTORY  Family History Family History  Problem Relation Age of Onset  . Prostate cancer Brother   . Prostate cancer Brother   . Suicidality Brother   . Prostate cancer Brother   . Stomach cancer Neg Hx   . Colon cancer Neg Hx     Social History Social History  Substance Use Topics  . Smoking status: Never Smoker  . Smokeless tobacco: Never Used  . Alcohol use No    Are there smokers in your home (other than you)?  No  Risk Factors Current exercise habits: Exercise is limited by wife being ill (dementia).  was exercising 3 times per week..  Dietary issues discussed: balance diet.   Cardiac risk factors: advanced age (older than 74 for men, 14 for women) and male gender.  Depression Screen (Note: if answer to either of the following is "Yes", a more complete depression screening is indicated)   Q1: Over the past two weeks, have you felt down, depressed or hopeless? No  Q2: Over the past two weeks,  have you felt little interest or pleasure in doing things? No  Have you lost interest or pleasure in daily life? No  Do you often feel hopeless? No  Do you cry easily over simple problems? No  Activities of Daily Living In your present state of health, do you have any difficulty performing the following activities?:  Driving? No Managing money?  No Feeding yourself? No Getting from bed to chair? No Climbing a flight of stairs? No Preparing food and eating?: No Bathing or showering? No Getting dressed:  No Getting to the toilet? No Using the toilet:No Moving around from place to place: No In the past year have you fallen or had a near fall?:No   Are you sexually active?  No  Do you have more than one partner?  No  Hearing Difficulties: yes but has hearing aid.  Has had about 1.5 years Do you often ask people to speak up or repeat themselves? No Do you experience ringing or noises in your ears? No Do you have difficulty understanding soft or whispered voices? No   Do you feel that you have a problem with memory? No  Do you often misplace items? No  Do you feel safe at home?  Yes  Cognitive Testing  Alert? Yes  Normal Appearance?Yes  Oriented to person? Yes  Place? Yes   Time? Yes  Recall of three objects?  Yes  Can perform simple calculations? No  Displays appropriate judgment?Yes  Can read the correct time from a watch face?Yes   Advanced Directives have been discussed with the patient? Yes   List the Names of Other Physician/Practitioners you currently use: 1.  [place] [facility]": yearly vision screening.  Wears glasses.  Has been stable for 5-6 years 2.  Gastroenterology, Manus Rudd, MD  Indicate any recent Medical Services you may have received from other than [facility] providers in the past year (date may be approximate).  Immunization History  Administered Date(s) Administered  . Influenza, High Dose Seasonal PF 05/20/2016  . Influenza,inj,Quad PF,6+ Mos 06/21/2013, 05/25/2014, 06/07/2015  . Pneumococcal Conjugate-13 07/11/2015  . Tdap 05/27/2007    Screening Tests Health Maintenance  Topic Date Due  . PNA vac Low Risk Adult (2 of 2 - PPSV23) 07/10/2016  . INFLUENZA VACCINE  03/26/2017  . TETANUS/TDAP  05/26/2017    All answers were reviewed with the patient and necessary referrals were made:  Gregory Doss, DO   05/19/2017   History reviewed: allergies, current medications, past family history, past medical history, past social history, past surgical  history and problem list  Review of Systems Pertinent items noted in HPI and remainder of comprehensive ROS otherwise negative.    Objective:     Blood pressure 139/85, pulse 82, temperature (!) 97.1 F (36.2 C), temperature source Oral, height 6' (1.829 m), weight 236 lb (107 kg). Body mass index is 32.01 kg/m.  BP 139/85   Pulse 82   Temp (!) 97.1 F (36.2 C) (Oral)   Ht 6' (1.829 m)   Wt 236 lb (107 kg)   BMI 32.01 kg/m  General appearance: alert, cooperative, appears older than stated age, cachectic, combative, cyanotic, delirious, distracted, fatigued, flushed, icteric and no distress Head: Normocephalic, without obvious abnormality, atraumatic Lungs: clear to auscultation bilaterally Pulses: 2+ and symmetric  Psych: mood stable, speech normal, good eye contact, pleasant  MMSE - Mini Mental State Exam 05/19/2017 05/17/2016  Orientation to time 5 5  Orientation to Place 5 5  Registration 3  3  Attention/ Calculation 2 2  Recall 3 3  Language- name 2 objects 2 2  Language- repeat 0 1  Language- follow 3 step command 3 3  Language- read & follow direction 1 1  Write a sentence 1 1  Copy design 1 1  Total score 26 27       Assessment:     Mr. Venhuizen is an 83 year old male who is in overall good health. He did score a 26 on today's MMSE. This is 1 point less than last year. Overall, seems to be independent in i ADLs and ADLs. He is the sole caregiver to his wife at this time. He does have 2 children that live close to him that occasionally provide assistance. Denies any concern for depression or anxiety at this time, no falls in the last year. He actually already has advance directives and will bring a copy to the office for scanning.     Plan:     During the course of the visit the patient was educated and counseled about appropriate screening and preventive services including:    Pneumococcal vaccine   Influenza vaccine  Diet review for nutrition referral?  Yes ____  Not Indicated _x___; patient is working on diet and exercise.   Patient Instructions (the written plan) was given to the patient.  Medicare Attestation I have personally reviewed: The patient's medical and social history Their use of alcohol, tobacco or illicit drugs Their current medications and supplements The patient's functional ability including ADLs,fall risks, home safety risks, cognitive, and hearing and visual impairment Diet and physical activities Evidence for depression or mood disorders  The patient's weight, height, BMI, and visual acuity have been recorded in the chart.  I have made referrals, counseling, and provided education to the patient based on review of the above and I have provided the patient with a written personalized care plan for preventive services.     Gregory Doss, DO   05/19/2017

## 2017-05-19 NOTE — Patient Instructions (Signed)
Thank you for coming in today for your Annual Medicare Wellness Visit.  Things that we discussed today are included in this packet.  Create and/or bring a copy of your Living Will/ Advanced Directive into the office so that we may respect your wishes should an emergency occur.  Get the recommended life-saving vaccines we discussed today.  Get your mammogram/ colonoscopy/ DEXA scans as directed by your provider.  Make sure that your medications are organized and safely stored.  Remember to always ask for help if you forget when/ how to take your medications.  Make healthy food choices (Rich in fruits/ veggies/ lean meats and low in salt, sugar and fat)  Do something that you enjoy for at least 30 minutes every day to stay active (walking, gardening, swimming, etc). This will help you lower your risk of falls/ broken bones.  Be social, do puzzles/ crosswords.  These things help the mind stay young and lower your risk of developing dementia.  Make sure that your home is safe by checking your smoke detectors regularly and doing the things outlined below to lower your risk of falls.  Fall Prevention in the Home Falls can cause injuries and can affect people from all age groups. There are many simple things that you can do to make your home safe and to help prevent falls. What can I do on the outside of my home? Regularly repair the edges of walkways and driveways and fix any cracks. Remove high doorway thresholds. Trim any shrubbery on the main path into your home. Use bright outdoor lighting. Clear walkways of debris and clutter, including tools and rocks. Regularly check that handrails are securely fastened and in good repair. Both sides of any steps should have handrails. Install guardrails along the edges of any raised decks or porches. Have leaves, snow, and ice cleared regularly. Use sand or salt on walkways during winter months. In the garage, clean up any spills right away, including  grease or oil spills. What can I do in the bathroom? Use night lights. Install grab bars by the toilet and in the tub and shower. Do not use towel bars as grab bars. Use non-skid mats or decals on the floor of the tub or shower. If you need to sit down while you are in the shower, use a plastic, non-slip stool. Keep the floor dry. Immediately clean up any water that spills on the floor. Remove soap buildup in the tub or shower on a regular basis. Attach bath mats securely with double-sided non-slip rug tape. Remove throw rugs and other tripping hazards from the floor. What can I do in the bedroom? Use night lights. Make sure that a bedside light is easy to reach. Do not use oversized bedding that drapes onto the floor. Have a firm chair that has side arms to use for getting dressed. Remove throw rugs and other tripping hazards from the floor. What can I do in the kitchen? Clean up any spills right away. Avoid walking on wet floors. Place frequently used items in easy-to-reach places. If you need to reach for something above you, use a sturdy step stool that has a grab bar. Keep electrical cables out of the way. Do not use floor polish or wax that makes floors slippery. If you have to use wax, make sure that it is non-skid floor wax. Remove throw rugs and other tripping hazards from the floor. What can I do in the stairways? Do not leave any items on the stairs.   Make sure that there are handrails on both sides of the stairs. Fix handrails that are broken or loose. Make sure that handrails are as long as the stairways. Check any carpeting to make sure that it is firmly attached to the stairs. Fix any carpet that is loose or worn. Avoid having throw rugs at the top or bottom of stairways, or secure the rugs with carpet tape to prevent them from moving. Make sure that you have a light switch at the top of the stairs and the bottom of the stairs. If you do not have them, have them  installed. What are some other fall prevention tips? Wear closed-toe shoes that fit well and support your feet. Wear shoes that have rubber soles or low heels. When you use a stepladder, make sure that it is completely opened and that the sides are firmly locked. Have someone hold the ladder while you are using it. Do not climb a closed stepladder. Add color or contrast paint or tape to grab bars and handrails in your home. Place contrasting color strips on the first and last steps. Use mobility aids as needed, such as canes, walkers, scooters, and crutches. Turn on lights if it is dark. Replace any light bulbs that burn out. Set up furniture so that there are clear paths. Keep the furniture in the same spot. Fix any uneven floor surfaces. Choose a carpet design that does not hide the edge of steps of a stairway. Be aware of any and all pets. Review your medicines with your healthcare provider. Some medicines can cause dizziness or changes in blood pressure, which increase your risk of falling. Talk with your health care provider about other ways that you can decrease your risk of falls. This may include working with a physical therapist or trainer to improve your strength, balance, and endurance. This information is not intended to replace advice given to you by your health care provider. Make sure you discuss any questions you have with your health care provider. Document Released: 08/02/2002 Document Revised: 01/09/2016 Document Reviewed: 09/16/2014 Elsevier Interactive Patient Education  2017 Elsevier Inc.  

## 2017-06-16 ENCOUNTER — Other Ambulatory Visit: Payer: Self-pay | Admitting: *Deleted

## 2017-06-16 DIAGNOSIS — Z789 Other specified health status: Secondary | ICD-10-CM

## 2017-06-16 NOTE — Progress Notes (Unsigned)
DNR scanned into chart  

## 2017-07-08 ENCOUNTER — Other Ambulatory Visit: Payer: Self-pay | Admitting: Family Medicine

## 2017-07-29 ENCOUNTER — Ambulatory Visit: Payer: Medicare Other | Admitting: Nurse Practitioner

## 2017-07-29 ENCOUNTER — Ambulatory Visit: Payer: Medicare Other | Admitting: Internal Medicine

## 2017-07-31 ENCOUNTER — Encounter: Payer: Self-pay | Admitting: Family Medicine

## 2017-07-31 ENCOUNTER — Ambulatory Visit (INDEPENDENT_AMBULATORY_CARE_PROVIDER_SITE_OTHER): Payer: Medicare Other | Admitting: Family Medicine

## 2017-07-31 VITALS — BP 129/79 | HR 67 | Temp 97.4°F | Ht 72.0 in | Wt 233.0 lb

## 2017-07-31 DIAGNOSIS — M1711 Unilateral primary osteoarthritis, right knee: Secondary | ICD-10-CM

## 2017-07-31 DIAGNOSIS — M1812 Unilateral primary osteoarthritis of first carpometacarpal joint, left hand: Secondary | ICD-10-CM | POA: Diagnosis not present

## 2017-07-31 DIAGNOSIS — M1811 Unilateral primary osteoarthritis of first carpometacarpal joint, right hand: Secondary | ICD-10-CM | POA: Diagnosis not present

## 2017-07-31 MED ORDER — MELOXICAM 15 MG PO TABS: 15.0000 mg | ORAL_TABLET | Freq: Every day | ORAL | 2 refills | Status: DC

## 2017-07-31 NOTE — Progress Notes (Signed)
BP 129/79   Pulse 67   Temp (!) 97.4 F (36.3 C) (Oral)   Ht 6' (1.829 m)   Wt 233 lb (105.7 kg)   BMI 31.60 kg/m    Subjective:    Patient ID: Gregory Pearson, male    DOB: 12-Dec-1927, 83 y.o.   MRN: [id number]  HPI: Gregory Pearson is a 83 y.o. male presenting on 07/31/2017 for Pain in right knee, thumbs, hands and neck (taking a couple of Tylenol daily)   HPI Arthritis pain in knees and thumbs and hands and neck Patient comes in complaining of arthritis pain in his knees and thumbs and some in the joints of his hand and a little bit in his neck.  He says this is been going on for some time but is flared up recently over the past couple weeks.  He has been taking Tylenol which does help some but wonders if there is something else he can take because he feels especially with his left hand that is starting to have trouble gripping because of the pain in the joints there.  He denies any fevers or chills or redness or warmth.  Relevant past medical, surgical, family and social history reviewed and updated as indicated. Interim medical history since our last visit reviewed. Allergies and medications reviewed and updated.  Review of Systems  Constitutional: Negative for chills and fever.  Respiratory: Negative for shortness of breath and wheezing.   Cardiovascular: Negative for chest pain and leg swelling.  Musculoskeletal: Positive for arthralgias and joint swelling. Negative for back pain, gait problem, myalgias, neck pain and neck stiffness.  Skin: Negative for color change and rash.  All other systems reviewed and are negative.   Per HPI unless specifically indicated above     Objective:    BP 129/79   Pulse 67   Temp (!) 97.4 F (36.3 C) (Oral)   Ht 6' (1.829 m)   Wt 233 lb (105.7 kg)   BMI 31.60 kg/m   Wt Readings from Last 3 Encounters:  07/31/17 233 lb (105.7 kg)  05/19/17 236 lb (107 kg)  12/25/16 233 lb 6.4 oz (105.9 kg)    Physical Exam  Constitutional: He is  oriented to person, place, and time. He appears well-developed and well-nourished. No distress.  Eyes: Conjunctivae are normal. No scleral icterus.  Pulmonary/Chest: Breath sounds normal.  Musculoskeletal: Normal range of motion. He exhibits no edema.  Mild pain with range of motion in both thumbs, especially in [facility] joint.  Mild diffuse tenderness in the right knee especially with range of motion.  No tenderness to palpation in neck or spine.  No loss of range of motion.  No noted weakness on exam  Neurological: He is alert and oriented to person, place, and time. Coordination normal.  Skin: Skin is warm and dry. No rash noted. He is not diaphoretic.  Psychiatric: He has a normal mood and affect. His behavior is normal.  Nursing note and vitals reviewed.       Assessment & Plan:   Problem List Items Addressed This Visit    None    Visit Diagnoses    Arthritis of carpometacarpal (CMC) joint of right thumb    -  Primary   Relevant Medications   meloxicam (MOBIC) 15 MG tablet   Arthritis of carpometacarpal (CMC) joint of left thumb       Relevant Medications   meloxicam (MOBIC) 15 MG tablet   Arthritis of right knee  Relevant Medications   meloxicam (MOBIC) 15 MG tablet       Follow up plan: Return if symptoms worsen or fail to improve.  Counseling provided for all of the vaccine components No orders of the defined types were placed in this encounter.   Caryl Pina, MD [facility] 07/31/2017, 11:24 AM

## 2017-08-22 DIAGNOSIS — R197 Diarrhea, unspecified: Secondary | ICD-10-CM | POA: Diagnosis not present

## 2017-08-22 DIAGNOSIS — K649 Unspecified hemorrhoids: Secondary | ICD-10-CM | POA: Diagnosis not present

## 2017-08-22 DIAGNOSIS — K625 Hemorrhage of anus and rectum: Secondary | ICD-10-CM | POA: Diagnosis not present

## 2017-09-23 ENCOUNTER — Ambulatory Visit: Payer: Medicare Other | Admitting: *Deleted

## 2017-09-23 DIAGNOSIS — Z23 Encounter for immunization: Secondary | ICD-10-CM

## 2017-09-23 NOTE — Progress Notes (Signed)
Patient tolerated well.

## 2018-01-09 ENCOUNTER — Ambulatory Visit (INDEPENDENT_AMBULATORY_CARE_PROVIDER_SITE_OTHER): Payer: Medicare Other | Admitting: Family Medicine

## 2018-01-09 ENCOUNTER — Ambulatory Visit (INDEPENDENT_AMBULATORY_CARE_PROVIDER_SITE_OTHER): Payer: Medicare Other

## 2018-01-09 ENCOUNTER — Encounter: Payer: Self-pay | Admitting: Family Medicine

## 2018-01-09 VITALS — BP 130/81 | HR 67 | Temp 98.1°F | Ht 72.0 in | Wt 233.0 lb

## 2018-01-09 DIAGNOSIS — M25361 Other instability, right knee: Secondary | ICD-10-CM

## 2018-01-09 DIAGNOSIS — M25362 Other instability, left knee: Secondary | ICD-10-CM

## 2018-01-09 DIAGNOSIS — R29898 Other symptoms and signs involving the musculoskeletal system: Secondary | ICD-10-CM | POA: Diagnosis not present

## 2018-01-09 DIAGNOSIS — M160 Bilateral primary osteoarthritis of hip: Secondary | ICD-10-CM | POA: Diagnosis not present

## 2018-01-09 MED ORDER — METHYLPREDNISOLONE ACETATE 80 MG/ML IJ SUSP
80.0000 mg | Freq: Once | INTRAMUSCULAR | Status: AC
  Administered 2018-01-09: 80 mg via INTRAMUSCULAR

## 2018-01-09 NOTE — Progress Notes (Signed)
BP 130/81   Pulse 67   Temp 98.1 F (36.7 C) (Oral)   Ht 6' (1.829 m)   Wt 233 lb (105.7 kg)   BMI 31.60 kg/m    Subjective:    Patient ID: Gregory Pearson, male    DOB: Dec 15, 1927, 84 y.o.   MRN: [id number]  HPI: Gregory Pearson is a 84 y.o. male presenting on 01/09/2018 for Swelling in legs/ankles and Knees popping, less strength in knees   HPI Knees popping and giving out Patient says is been having a lot more issues with his knees popping and giving out and catching.  He says this is been going on a lot more over the past couple weeks.  He also feels like he is been having more swelling and edema in both of his ankles since this started.  He denies any fevers or chills.  He does feel weaker in both knees but denies any numbness.  He says this is been gradually worsening but really bad over the past week.  He also feels like his legs and ankles have been more swelling.  He denies any fevers or chills or redness or warmth.  Relevant past medical, surgical, family and social history reviewed and updated as indicated. Interim medical history since our last visit reviewed. Allergies and medications reviewed and updated.  Review of Systems  Constitutional: Negative for chills and fever.  Respiratory: Negative for shortness of breath and wheezing.   Cardiovascular: Positive for leg swelling. Negative for chest pain.  Musculoskeletal: Positive for arthralgias and joint swelling. Negative for back pain and gait problem.  Skin: Negative for rash.  All other systems reviewed and are negative.   Per HPI unless specifically indicated above   Allergies as of 01/09/2018   No Known Allergies     Medication List        Accurate as of 01/09/18  2:49 PM. Always use your most recent med list.          meloxicam 15 MG tablet Commonly known as:  MOBIC Take 1 tablet (15 mg total) by mouth daily.   multivitamin tablet Take 1 tablet by mouth daily.   Omega-3 1000 MG Caps Take 2,000 mg by  mouth daily.   pantoprazole 40 MG tablet Commonly known as:  PROTONIX Take 1 tablet (40 mg total) by mouth daily.   PROBIOTIC DAILY PO Take by mouth.          Objective:    BP 130/81   Pulse 67   Temp 98.1 F (36.7 C) (Oral)   Ht 6' (1.829 m)   Wt 233 lb (105.7 kg)   BMI 31.60 kg/m   Wt Readings from Last 3 Encounters:  01/09/18 233 lb (105.7 kg)  07/31/17 233 lb (105.7 kg)  05/19/17 236 lb (107 kg)    Physical Exam  Constitutional: He is oriented to person, place, and time. He appears well-developed and well-nourished. No distress.  Eyes: Pupils are equal, round, and reactive to light. Conjunctivae and EOM are normal. Right eye exhibits no discharge. No scleral icterus.  Neck: Neck supple. No thyromegaly present.  Musculoskeletal: Normal range of motion. He exhibits no edema.       Right knee: He exhibits effusion. He exhibits no deformity, normal alignment, no LCL laxity, normal patellar mobility, normal meniscus and no MCL laxity. No tenderness found. No medial joint line and no lateral joint line tenderness noted.       Left knee: He exhibits effusion. He exhibits  no deformity, normal alignment, no LCL laxity, normal patellar mobility, no bony tenderness, normal meniscus and no MCL laxity. No tenderness found. No medial joint line and no lateral joint line tenderness noted.  Lymphadenopathy:    He has no cervical adenopathy.  Neurological: He is alert and oriented to person, place, and time. Coordination normal.  Skin: Skin is warm and dry. No rash noted. He is not diaphoretic.  Psychiatric: He has a normal mood and affect. His behavior is normal.  Nursing note and vitals reviewed.   Bilateral knee x-ray: Severe osteoarthritis, bone-on-bone.  Knee injection x2/bilateral: Consent form signed. Risk factors of bleeding and infection discussed with patient and patient is agreeable towards injection. Patient prepped with Betadine. Lateral approach towards injection used.  Injected 80 mg of Depo-Medrol and 1 mL of 2% lidocaine. Patient tolerated procedure well and no side effects from noted. Minimal to no bleeding. Simple bandage applied after.     Assessment & Plan:   Problem List Items Addressed This Visit    None    Visit Diagnoses    Popping sound of knee joint    -  Primary   Instability of left knee joint       Relevant Medications   methylPREDNISolone acetate (DEPO-MEDROL) injection 80 mg   Other Relevant Orders   DG Knee Bilateral Standing AP   Knee gives out, right       Relevant Medications   methylPREDNISolone acetate (DEPO-MEDROL) injection 80 mg   Other Relevant Orders   DG Knee Bilateral Standing AP     Patient has bilateral severe knee osteoarthritis, will try injections to see if they resolve his issue but if they do not then may need to consider orthopedic referral.  Believe that the swelling is related to the swelling from the knees.  Follow up plan: Return if symptoms worsen or fail to improve.  Counseling provided for all of the vaccine components Orders Placed This Encounter  Procedures  . DG Knee 1-2 Views Right  . DG Knee 1-2 Views Left    Caryl Pina, MD [facility] 01/09/2018, 2:49 PM

## 2018-01-16 ENCOUNTER — Encounter: Payer: Self-pay | Admitting: Family Medicine

## 2018-01-16 ENCOUNTER — Ambulatory Visit (INDEPENDENT_AMBULATORY_CARE_PROVIDER_SITE_OTHER): Payer: Medicare Other | Admitting: Family Medicine

## 2018-01-16 VITALS — BP 126/77 | HR 67 | Temp 98.0°F | Ht 72.0 in | Wt 227.0 lb

## 2018-01-16 DIAGNOSIS — Z8719 Personal history of other diseases of the digestive system: Secondary | ICD-10-CM | POA: Diagnosis not present

## 2018-01-16 DIAGNOSIS — R739 Hyperglycemia, unspecified: Secondary | ICD-10-CM | POA: Diagnosis not present

## 2018-01-16 DIAGNOSIS — E782 Mixed hyperlipidemia: Secondary | ICD-10-CM | POA: Diagnosis not present

## 2018-01-16 DIAGNOSIS — D696 Thrombocytopenia, unspecified: Secondary | ICD-10-CM

## 2018-01-16 NOTE — Progress Notes (Signed)
BP 126/77   Pulse 67   Temp 98 F (36.7 C) (Oral)   Ht 6' (1.829 m)   Wt 227 lb (103 kg)   BMI 30.79 kg/m    Subjective:    Patient ID: Gregory Pearson, male    DOB: April 06, 1928, 84 y.o.   MRN: [id number]  HPI: Gregory Pearson is a 84 y.o. male presenting on 01/16/2018 for Annual Exam (patient is fasting; would like to have B-12 checked also)   HPI Hyperlipidemia Patient is coming in for recheck of his hyperlipidemia. The patient is currently taking fish oil. They deny any issues with myalgias or history of liver damage from it. They deny any focal numbness or weakness or chest pain.   Patient has had elevated blood sugars in the past and we will recheck his labs today.  Patient had a history of GI bleed and anemia and thrombocytopenia, we will check his labs today and see where he is at.  Patient gets intermittent knee pains and grinding and intermittent low back pains that are both mild  Relevant past medical, surgical, family and social history reviewed and updated as indicated. Interim medical history since our last visit reviewed. Allergies and medications reviewed and updated.  Review of Systems  Constitutional: Negative for chills and fever.  HENT: Negative for ear pain and tinnitus.   Eyes: Negative for pain.  Respiratory: Negative for cough, shortness of breath and wheezing.   Cardiovascular: Negative for chest pain, palpitations and leg swelling.  Gastrointestinal: Negative for abdominal pain, blood in stool, constipation and diarrhea.  Genitourinary: Negative for dysuria and hematuria.  Musculoskeletal: Positive for arthralgias and back pain. Negative for myalgias.  Skin: Negative for rash.  Neurological: Negative for dizziness, weakness and headaches.  Psychiatric/Behavioral: Negative for suicidal ideas.    Per HPI unless specifically indicated above   Allergies as of 01/16/2018   No Known Allergies     Medication List        Accurate as of 01/16/18  3:04 PM.  Always use your most recent med list.          meloxicam 15 MG tablet Commonly known as:  MOBIC Take 1 tablet (15 mg total) by mouth daily.   multivitamin tablet Take 1 tablet by mouth daily.   Omega-3 1000 MG Caps Take 2,000 mg by mouth daily.   PROBIOTIC DAILY PO Take by mouth.          Objective:    BP 126/77   Pulse 67   Temp 98 F (36.7 C) (Oral)   Ht 6' (1.829 m)   Wt 227 lb (103 kg)   BMI 30.79 kg/m   Wt Readings from Last 3 Encounters:  01/16/18 227 lb (103 kg)  01/09/18 233 lb (105.7 kg)  07/31/17 233 lb (105.7 kg)    Physical Exam  Constitutional: He is oriented to person, place, and time. He appears well-developed and well-nourished. No distress.  Eyes: Pupils are equal, round, and reactive to light. Conjunctivae and EOM are normal. Right eye exhibits no discharge. No scleral icterus.  Neck: Neck supple. No thyromegaly present.  Cardiovascular: Normal rate, regular rhythm, normal heart sounds and intact distal pulses.  No murmur heard. Pulmonary/Chest: Effort normal and breath sounds normal. No respiratory distress. He has no wheezes.  Musculoskeletal: Normal range of motion. He exhibits tenderness (Left lower lumbar tenderness, negative straight leg raise, does not radiate anywhere). He exhibits no edema.  Lymphadenopathy:    He has no cervical adenopathy.  Neurological: He is alert and oriented to person, place, and time. Coordination normal.  Skin: Skin is warm and dry. No rash noted. He is not diaphoretic.  Psychiatric: He has a normal mood and affect. His behavior is normal.  Nursing note and vitals reviewed.       Assessment & Plan:   Problem List Items Addressed This Visit      Other   Thrombocytopenia ([place])   Relevant Orders   Vitamin B12 (Completed)   Hyperlipidemia - Primary   Relevant Orders   Lipid panel (Completed)   Hyperglycemia   Relevant Orders   CMP14+EGFR (Completed)   History of GI bleed   Relevant Orders   CBC with  Differential/Platelet (Completed)   Vitamin B12 (Completed)       Follow up plan: Return in about 1 year (around 01/17/2019), or if symptoms worsen or fail to improve, for Recheck labs, return sooner if any new injections.  Counseling provided for all of the vaccine components Orders Placed This Encounter  Procedures  . CBC with Differential/Platelet  . CMP14+EGFR  . Lipid panel  . Vitamin B12    Caryl Pina, MD [facility] 01/16/2018, 3:04 PM

## 2018-01-17 LAB — CBC WITH DIFFERENTIAL/PLATELET
Basophils Absolute: 0 10*3/uL (ref 0.0–0.2)
Basos: 0 %
EOS (ABSOLUTE): 0.2 10*3/uL (ref 0.0–0.4)
EOS: 4 %
HEMATOCRIT: 43.6 % (ref 37.5–51.0)
HEMOGLOBIN: 14.6 g/dL (ref 13.0–17.7)
Immature Grans (Abs): 0 10*3/uL (ref 0.0–0.1)
Immature Granulocytes: 0 %
Lymphocytes Absolute: 1.5 10*3/uL (ref 0.7–3.1)
Lymphs: 26 %
MCH: 30.7 pg (ref 26.6–33.0)
MCHC: 33.5 g/dL (ref 31.5–35.7)
MCV: 92 fL (ref 79–97)
MONOCYTES: 9 %
MONOS ABS: 0.5 10*3/uL (ref 0.1–0.9)
NEUTROS PCT: 61 %
Neutrophils Absolute: 3.5 10*3/uL (ref 1.4–7.0)
Platelets: 156 10*3/uL (ref 150–450)
RBC: 4.75 x10E6/uL (ref 4.14–5.80)
RDW: 15.5 % — AB (ref 12.3–15.4)
WBC: 5.7 10*3/uL (ref 3.4–10.8)

## 2018-01-17 LAB — CMP14+EGFR
A/G RATIO: 1.9 (ref 1.2–2.2)
ALT: 29 IU/L (ref 0–44)
AST: 30 IU/L (ref 0–40)
Albumin: 4.3 g/dL (ref 3.5–4.7)
Alkaline Phosphatase: 79 IU/L (ref 39–117)
BILIRUBIN TOTAL: 1 mg/dL (ref 0.0–1.2)
BUN/Creatinine Ratio: 22 (ref 10–24)
BUN: 16 mg/dL (ref 8–27)
CHLORIDE: 103 mmol/L (ref 96–106)
CO2: 24 mmol/L (ref 20–29)
Calcium: 8.9 mg/dL (ref 8.6–10.2)
Creatinine, Ser: 0.73 mg/dL — ABNORMAL LOW (ref 0.76–1.27)
GFR calc non Af Amer: 83 mL/min/{1.73_m2} (ref >59)
GFR, EST AFRICAN AMERICAN: 96 mL/min/{1.73_m2} (ref >59)
GLOBULIN, TOTAL: 2.3 g/dL (ref 1.5–4.5)
GLUCOSE: 91 mg/dL (ref 65–99)
POTASSIUM: 4.1 mmol/L (ref 3.5–5.2)
SODIUM: 141 mmol/L (ref 134–144)
Total Protein: 6.6 g/dL (ref 6.0–8.5)

## 2018-01-17 LAB — VITAMIN B12: VITAMIN B 12: 444 pg/mL (ref 232–1245)

## 2018-01-17 LAB — LIPID PANEL
Chol/HDL Ratio: 2.4 ratio (ref 0.0–5.0)
Cholesterol, Total: 156 mg/dL (ref 100–199)
HDL: 66 mg/dL (ref >39)
LDL Calculated: 78 mg/dL (ref 0–99)
Triglycerides: 62 mg/dL (ref 0–149)
VLDL Cholesterol Cal: 12 mg/dL (ref 5–40)

## 2018-03-30 ENCOUNTER — Other Ambulatory Visit: Payer: Self-pay | Admitting: Family Medicine

## 2018-03-30 DIAGNOSIS — M1812 Unilateral primary osteoarthritis of first carpometacarpal joint, left hand: Secondary | ICD-10-CM

## 2018-03-30 DIAGNOSIS — M1811 Unilateral primary osteoarthritis of first carpometacarpal joint, right hand: Secondary | ICD-10-CM

## 2018-03-30 DIAGNOSIS — M1711 Unilateral primary osteoarthritis, right knee: Secondary | ICD-10-CM

## 2018-05-20 ENCOUNTER — Ambulatory Visit (INDEPENDENT_AMBULATORY_CARE_PROVIDER_SITE_OTHER): Payer: Medicare Other

## 2018-05-20 ENCOUNTER — Ambulatory Visit: Payer: Medicare Other | Admitting: *Deleted

## 2018-05-20 DIAGNOSIS — Z23 Encounter for immunization: Secondary | ICD-10-CM | POA: Diagnosis not present

## 2018-05-21 ENCOUNTER — Ambulatory Visit (INDEPENDENT_AMBULATORY_CARE_PROVIDER_SITE_OTHER): Payer: Medicare Other | Admitting: Family Medicine

## 2018-05-21 ENCOUNTER — Encounter: Payer: Self-pay | Admitting: Family Medicine

## 2018-05-21 ENCOUNTER — Ambulatory Visit (INDEPENDENT_AMBULATORY_CARE_PROVIDER_SITE_OTHER): Payer: Medicare Other | Admitting: *Deleted

## 2018-05-21 VITALS — BP 125/74 | HR 80 | Ht 70.5 in | Wt 234.0 lb

## 2018-05-21 VITALS — BP 125/74 | HR 80 | Temp 96.4°F | Ht 72.0 in | Wt 234.2 lb

## 2018-05-21 DIAGNOSIS — M170 Bilateral primary osteoarthritis of knee: Secondary | ICD-10-CM

## 2018-05-21 DIAGNOSIS — Z0000 Encounter for general adult medical examination without abnormal findings: Secondary | ICD-10-CM | POA: Diagnosis not present

## 2018-05-21 MED ORDER — METHYLPREDNISOLONE ACETATE 80 MG/ML IJ SUSP
80.0000 mg | Freq: Once | INTRAMUSCULAR | Status: AC
  Administered 2018-05-21: 80 mg via INTRAMUSCULAR

## 2018-05-21 MED ORDER — DICLOFENAC SODIUM 1 % TD GEL
2.0000 g | Freq: Four times a day (QID) | TRANSDERMAL | 2 refills | Status: DC
Start: 2018-05-21 — End: 2019-07-07

## 2018-05-21 NOTE — Progress Notes (Signed)
BP 125/74   Pulse 80   Temp (!) 96.4 F (35.8 C) (Oral)   Ht 6' (1.829 m)   Wt 234 lb 3.2 oz (106.2 kg)   BMI 31.76 kg/m    Subjective:    Patient ID: Gregory Pearson, male    DOB: Feb 15, 1928, 84 y.o.   MRN: [id number]  HPI: Gregory Pearson is a 84 y.o. male presenting on 05/21/2018 for Knee Pain (bilateral knee paun- ongoing but has gotten worse x 2 weeks. Would like knee injections)   HPI Bilateral knee pain Patient had injections 4 months ago and was doing well with that with the occasional popping and giving way of the knee but very infrequently but over the past 2 weeks is started to increase and yesterday he was having his left knee especially giving way more throughout the day.  He is coming in today to see if he can get injections again.  Is been 4 months since his had injections in both of his knees.  He also takes meloxicam.  He says he is very resistant towards going for surgery and will continue to do exercises on his own for therapy.  Relevant past medical, surgical, family and social history reviewed and updated as indicated. Interim medical history since our last visit reviewed. Allergies and medications reviewed and updated.  Review of Systems  Constitutional: Negative for chills and fever.  Respiratory: Negative for shortness of breath and wheezing.   Cardiovascular: Negative for chest pain and leg swelling.  Musculoskeletal: Positive for arthralgias. Negative for back pain and gait problem.  Skin: Negative for rash.  Neurological: Negative for weakness and numbness.  All other systems reviewed and are negative.   Per HPI unless specifically indicated above   Allergies as of 05/21/2018   No Known Allergies     Medication List        Accurate as of 05/21/18  8:50 AM. Always use your most recent med list.          meloxicam 15 MG tablet Commonly known as:  MOBIC TAKE 1 TABLET BY MOUTH  DAILY   multivitamin tablet Take 1 tablet by mouth daily.   Omega-3  1000 MG Caps Take 2,000 mg by mouth daily.   PROBIOTIC DAILY PO Take by mouth.          Objective:    BP 125/74   Pulse 80   Temp (!) 96.4 F (35.8 C) (Oral)   Ht 6' (1.829 m)   Wt 234 lb 3.2 oz (106.2 kg)   BMI 31.76 kg/m   Wt Readings from Last 3 Encounters:  05/21/18 234 lb 3.2 oz (106.2 kg)  01/16/18 227 lb (103 kg)  01/09/18 233 lb (105.7 kg)    Physical Exam  Constitutional: He is oriented to person, place, and time. He appears well-developed and well-nourished. No distress.  Eyes: Conjunctivae are normal. No scleral icterus.  Musculoskeletal: Normal range of motion. He exhibits tenderness (More tenderness in left knee on both the medial lateral aspects of the right knee, no popping or catching or giving way noted on exam). He exhibits no edema.  Neurological: He is alert and oriented to person, place, and time. Coordination normal.  Skin: Skin is warm and dry. No rash noted. He is not diaphoretic.  Psychiatric: He has a normal mood and affect. His behavior is normal.  Nursing note and vitals reviewed.   Knee injection bilateral: Consent form signed. Risk factors of bleeding and infection discussed with patient  and patient is agreeable towards injection. Patient prepped with Betadine. Lateral approach towards injection used. Injected 80 mg of Depo-Medrol and 1 mL of 2% lidocaine. Patient tolerated procedure well and no side effects from noted. Minimal to no bleeding. Simple bandage applied after.     Assessment & Plan:   Problem List Items Addressed This Visit    None    Visit Diagnoses    Primary osteoarthritis of both knees    -  Primary   Relevant Medications   methylPREDNISolone acetate (DEPO-MEDROL) injection 80 mg   methylPREDNISolone acetate (DEPO-MEDROL) injection 80 mg     Give injections and if needed may return in 4 months, if he changes his mind about orthopedic or physical therapy then we can always do the referral.  Follow up plan: Return if  symptoms worsen or fail to improve.  Counseling provided for all of the vaccine components No orders of the defined types were placed in this encounter.   Caryl Pina, MD [facility] 05/21/2018, 8:50 AM

## 2018-05-21 NOTE — Progress Notes (Signed)
Subjective:   Gregory Pearson is a 84 y.o. male who presents for a Medicare Annual Wellness Visit. Gregory Pearson lives at home with his wife who has early to mid stage dementia.  He has taken over most of the household chores as well as cooking and bill payment. He manages her medications also. He does the yardwork as well but has difficulty doing everything. He has considered hiring someone to come in and clean every week or two. He's not sure that his wife will agree to this though. He has one daughter that lives in [place] and two sons that live locally. They actually live on the same property as Gregory Pearson and his wife. He has one grandchild and no great grandchildren. He is retired but stays active. He has gym equipment in his garage but does not use it. He and his wife were going to the local gym several days a week but they haven't been recently. He has considered the [employer] program at the [facility]. It is covered by his insurance. 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 States that he often has three screens going at one time.  Review of Systems    Patient reports that his overall health is unchanged compared to last year.  Cardiac Risk Factors include: advanced age (>30men, >21 women)  Musculoskeletal: bilateral knee pain. Saw Dr Dettinger this morning and had steroid injections. Has ordered CBD cream and is optimistic that it will help the discomfort. He is using a cane to help with balance and to bear some of the pressure on his knees.  All other systems negative     Current Medications (verified) Outpatient Encounter Medications as of 05/21/2018  Medication Sig  . diclofenac sodium (VOLTAREN) 1 % GEL Apply 2 g topically 4 (four) times daily.  . meloxicam (MOBIC) 15 MG tablet TAKE 1 TABLET BY MOUTH  DAILY  . Multiple Vitamin (MULTIVITAMIN) tablet Take 1 tablet  by mouth daily.  . Omega-3 1000 MG CAPS Take 2,000 mg by mouth daily.  . Probiotic Product (PROBIOTIC DAILY PO) Take by mouth.  . [EXPIRED] methylPREDNISolone acetate (DEPO-MEDROL) injection 80 mg   . [EXPIRED] methylPREDNISolone acetate (DEPO-MEDROL) injection 80 mg    No facility-administered encounter medications on file as of 05/21/2018.     Allergies (verified) Patient has no known allergies.   History: Past Medical History:  Diagnosis Date  . Asbestos exposure   . Blood transfusion without reported diagnosis    after ruptured appendix 1963  . Diverticulosis   . GERD (gastroesophageal reflux disease)   . History of stomach ulcers 1995   H.pylori s/p treatment  . Hyperlipidemia   . Muscle strain of right gluteal region 01/24/2014  . Prostate cancer [facility]) 2002   prostatectomy  . Traumatic hematoma of buttock 01/24/2014   Past Surgical History:  Procedure Laterality Date  . APPENDECTOMY  1963   ruptured appendix/gangrene  . COLONOSCOPY  2013   Dr. Fuller Plan: multiple colon polyps (tubular adenomas), diverticulosis. no further surveillance colonoscopies due to age.  . ESOPHAGOGASTRODUODENOSCOPY  1740,8144   Dr. Fuller Plan: gastric ulcer (h.pylori + s/p tx), follow up EGD verified ulcer healing  . ESOPHAGOGASTRODUODENOSCOPY N/A 09/26/2016   Procedure: ESOPHAGOGASTRODUODENOSCOPY (EGD);  Surgeon: Daneil Dolin, MD;  Location: [facility];  Service: Endoscopy;  Laterality: N/A;  . ESOPHAGOGASTRODUODENOSCOPY N/A 01/23/2017  Procedure: ESOPHAGOGASTRODUODENOSCOPY (EGD);  Surgeon: Daneil Dolin, MD;  Location: [facility];  Service: Endoscopy;  Laterality: N/A;  12:15pm  . [place]  . LAPAROSCOPIC CHOLECYSTECTOMY  2003  . PROSTATECTOMY  2002   Family History  Problem Relation Age of Onset  . Prostate cancer Brother   . Prostate cancer Brother   . Suicidality Brother   . Prostate cancer Brother   . Stomach cancer Neg Hx   . Colon cancer Neg Hx    Social History    Socioeconomic History  . Marital status: Married    Spouse name: Not on file  . Number of children: Not on file  . Years of education: Not on file  . Highest education level: Not on file  Occupational History  . Occupation: retired Retail buyer  Social Needs  . Financial resource strain: Not on file  . Food insecurity:    Worry: Not on file    Inability: Not on file  . Transportation needs:    Medical: Not on file    Non-medical: Not on file  Tobacco Use  . Smoking status: Never Smoker  . Smokeless tobacco: Never Used  Substance and Sexual Activity  . Alcohol use: No  . Drug use: No  . Sexual activity: Not on file  Lifestyle  . Physical activity:    Days per week: Not on file    Minutes per session: Not on file  . Stress: Not on file  Relationships  . Social connections:    Talks on phone: Not on file    Gets together: Not on file    Attends religious service: Not on file    Active member of club or organization: Not on file    Attends meetings of clubs or organizations: Not on file    Relationship status: Not on file  Other Topics Concern  . Not on file  Social History Narrative  . Not on file    Tobacco Use No.  Clinical Intake:  Pre-visit preparation completed: No  Pain : 0-10 Pain Score: 4  Pain Type: Chronic pain Pain Location: Hand Pain Descriptors / Indicators: Aching Pain Onset: More than a month ago Pain Frequency: Intermittent Pain Relieving Factors: Joint injections, mobic, voltaren gel Effect of Pain on Daily Activities: minimal  Pain Relieving Factors: Joint injections, mobic, voltaren gel              Activities of Daily Living In your present state of health, do you have any difficulty performing the following activities: 05/21/2018  Hearing? Y  Comment wears haring aids and does well with him  Vision? N  Comment no trouble with vision. last exam was about 1.5 years ago  Difficulty concentrating or making decisions? N   Walking or climbing stairs? N  Dressing or bathing? N  Doing errands, shopping? N  Preparing Food and eating ? N  Using the Toilet? N  In the past six months, have you accidently leaked urine? N  Do you have problems with loss of bowel control? N  Managing your Medications? N  Managing your Finances? N  Housekeeping or managing your Housekeeping? N  Some recent data might be hidden     Diet Diet Breakfast-Oatmeal Lunch-Picks up food sometimes and sometimes fixes frozen meals Supper-Protein Drink  Also eats fresh fruit and drinks milk, water (about 4 or 5 bottles a day), and coffee  Exercise Current Exercise Habits: The patient does not participate in regular exercise at  present, Intensity: Moderate, Exercise limited by: orthopedic condition(s)(knee pain and swelling)    Depression Screen PHQ 2/9 Scores 05/21/2018 05/21/2018 01/16/2018 01/09/2018  PHQ - 2 Score 0 0 0 0     Fall Risk Fall Risk  05/21/2018 05/21/2018 01/16/2018 01/09/2018 11/14/2016  Falls in the past year? No No No No No  Number falls in past yr: - - - - -  Risk for fall due to : - - - - -    Safety Is the patient's home free of loose throw rugs in walkways, pet beds, electrical cords, etc?   yes      Handrails on the stairs?   yes      Adequate lighting?   yes  Patient Care Team: Dettinger, Fransisca Kaufmann, MD as PCP - General (Family Medicine)  Hospitalizations, surgeries, and ER visits in previous 12 months No hospitalizations, ER visits, or surgeries this past year.   Objective:    Today's Vitals   05/21/18 1118 05/21/18 1122  BP: 125/74   Pulse: 80   Weight: 234 lb (106.1 kg)   Height: 5' 10.5" (1.791 m)   PainSc:  4    Body mass index is 33.1 kg/m.  Advanced Directives 05/21/2018 01/23/2017 09/26/2016 09/25/2016 09/25/2016 09/25/2016 05/17/2016  Does Patient Have a Medical Advance Directive? No No Yes Yes Yes Yes Yes;No  Type of Advance Directive - - Healthcare Power of [place] will -  Does patient want to make changes to medical advance directive? - - - No - Patient declined - - Yes - information given  Copy of [place] in Chart? - - No - copy requested No - copy requested No - copy requested - -  Would patient like information on creating a medical advance directive? Yes (MAU/Ambulatory/Procedural Areas - Information given);No - Patient declined No - Patient declined - No - Patient declined - No - Patient declined -  Pre-existing out of facility DNR order (yellow form or pink MOST form) - - - - - - -    Hearing/Vision  No hearing or vision deficits noted during visit.   Cognitive Function: MMSE - Mini Mental State Exam 05/21/2018 05/19/2017 05/17/2016  Orientation to time 5 5 5   Orientation to Place 5 5 5   Registration 3 3 3   Attention/ Calculation 3 2 2   Recall 3 3 3   Language- name 2 objects 2 2 2   Language- repeat 1 0 1  Language- follow 3 step command 3 3 3   Language- read & follow direction 1 1 1   Write a sentence 1 1 1   Copy design 1 1 1   Total score 28 26 27        Normal Cognitive Function Screening: Yes    Immunizations and Health Maintenance Immunization History  Administered Date(s) Administered  . Influenza, High Dose Seasonal PF 05/20/2016, 05/19/2017, 05/20/2018  . Influenza,inj,Quad PF,6+ Mos 06/21/2013, 05/25/2014, 06/07/2015  . Pneumococcal Conjugate-13 07/11/2015  . Pneumococcal Polysaccharide-23 05/19/2017  . Tdap 05/27/2007  . Zoster Recombinat (Shingrix) 09/23/2017   There are no preventive care reminders to display for this patient. Health Maintenance  Topic Date Due  . TETANUS/TDAP  07/31/2018 (Originally 05/26/2017)  . INFLUENZA VACCINE  Completed  . PNA vac Low Risk Adult  Completed        Assessment:   This is a routine wellness examination for [place].      Plan:    Goals    .  Exercise 3x per week (30 min per time)        Health Maintenance  Recommendations: Influenza vaccine  Additional Screening Recommendations: Lung: Low Dose CT Chest recommended if Age 65-80 years, 30 pack-year currently smoking OR have quit w/in 15years. Patient does not qualify. Hepatitis C Screening recommended: no   Keep f/u with Dettinger, Fransisca Kaufmann, MD and any other specialty appointments you may have Continue current medications Move carefully to avoid falls. Use assistive devices like a cane or walker if needed. Aim for at least 150 minutes of moderate activity a week. This can be chair exercises if necessary. Reading or puzzles are a good way to exercise your brain Stay connected with friends and family. Social connections are beneficial to your emotional and mental health.  Flu shot given today  I have personally reviewed and noted the following in the patient's chart:   . Medical and social history . Use of alcohol, tobacco or illicit drugs  . Current medications and supplements . Functional ability and status . Nutritional status . Physical activity . Advanced directives . List of other physicians . Hospitalizations, surgeries, and ER visits in previous 12 months . Vitals . Screenings to include cognitive, depression, and falls . Referrals and appointments  In addition, I have reviewed and discussed with patient certain preventive protocols, quality metrics, and best practice recommendations. A written personalized care plan for preventive services as well as general preventive health recommendations were provided to patient.     Chong Sicilian, RN   05/22/2018

## 2018-05-21 NOTE — Patient Instructions (Signed)
  Mr. Radler , Thank you for taking time to come for your Medicare Wellness Visit. I appreciate your ongoing commitment to your health goals. Please review the following plan we discussed and let me know if I can assist you in the future.   These are the goals we discussed: Goals    . Exercise 3x per week (30 min per time)       This is a list of the screening recommended for you and due dates:  Health Maintenance  Topic Date Due  . Tetanus Vaccine  07/31/2018*  . Flu Shot  Completed  . Pneumonia vaccines  Completed  *Topic was postponed. The date shown is not the original due date.

## 2018-06-11 ENCOUNTER — Ambulatory Visit: Payer: Medicare Other | Admitting: *Deleted

## 2018-06-11 DIAGNOSIS — Z23 Encounter for immunization: Secondary | ICD-10-CM

## 2018-06-11 NOTE — Progress Notes (Signed)
shingrix given and patient tolerated well.

## 2018-07-20 ENCOUNTER — Encounter: Payer: Self-pay | Admitting: Family Medicine

## 2018-07-20 ENCOUNTER — Ambulatory Visit (INDEPENDENT_AMBULATORY_CARE_PROVIDER_SITE_OTHER): Payer: Medicare Other | Admitting: Family Medicine

## 2018-07-20 VITALS — BP 141/89 | HR 79 | Temp 97.0°F | Ht 70.5 in | Wt 236.4 lb

## 2018-07-20 DIAGNOSIS — L509 Urticaria, unspecified: Secondary | ICD-10-CM

## 2018-07-20 NOTE — Progress Notes (Signed)
BP (!) 141/89   Pulse 79   Temp (!) 97 F (36.1 C) (Oral)   Ht 5' 10.5" (1.791 m)   Wt 236 lb 6.4 oz (107.2 kg)   BMI 33.44 kg/m    Subjective:    Patient ID: Gregory Pearson, male    DOB: 1927/10/05, 84 y.o.   MRN: [id number]  HPI: Gregory Pearson is a 84 y.o. male presenting on 07/20/2018 for Rash (back. Patient states it was bad friday night but has improved)   HPI Rash Patient comes in complaining of a rash that he experienced 3 nights ago.  He says that he did have a banana and some hot dogs earlier in the day but not anything that he does not normally eat and then he developed this rash that spread over his whole body and was a very pruritic and was pink and look like whelps that spread over his whole chest and arms and neck and legs and then he took some Benadryl and it went away within a couple hours.  He denies any closing up of his throat or difficulty breathing.  He has had one similar incident like this before.  He denies any rash currently and it has not recurred since that time.  Relevant past medical, surgical, family and social history reviewed and updated as indicated. Interim medical history since our last visit reviewed. Allergies and medications reviewed and updated.  Review of Systems  Constitutional: Negative for chills and fever.  Respiratory: Negative for shortness of breath and wheezing.   Cardiovascular: Negative for chest pain and leg swelling.  Skin: Positive for rash.  All other systems reviewed and are negative.   Per HPI unless specifically indicated above   Allergies as of 07/20/2018   No Known Allergies     Medication List        Accurate as of 07/20/18 11:59 PM. Always use your most recent med list.          diclofenac sodium 1 % Gel Commonly known as:  VOLTAREN Apply 2 g topically 4 (four) times daily.   meloxicam 15 MG tablet Commonly known as:  MOBIC TAKE 1 TABLET BY MOUTH  DAILY   multivitamin tablet Take 1 tablet by mouth  daily.   Omega-3 1000 MG Caps Take 2,000 mg by mouth daily.   PROBIOTIC DAILY PO Take by mouth.          Objective:    BP (!) 141/89   Pulse 79   Temp (!) 97 F (36.1 C) (Oral)   Ht 5' 10.5" (1.791 m)   Wt 236 lb 6.4 oz (107.2 kg)   BMI 33.44 kg/m   Wt Readings from Last 3 Encounters:  07/20/18 236 lb 6.4 oz (107.2 kg)  05/21/18 234 lb (106.1 kg)  05/21/18 234 lb 3.2 oz (106.2 kg)    Physical Exam  Constitutional: He is oriented to person, place, and time. He appears well-developed and well-nourished. No distress.  Eyes: Conjunctivae are normal. No scleral icterus.  Neck: Neck supple. No thyromegaly present.  Cardiovascular: Normal rate, regular rhythm, normal heart sounds and intact distal pulses.  No murmur heard. Pulmonary/Chest: Effort normal and breath sounds normal. No respiratory distress. He has no wheezes.  Musculoskeletal: Normal range of motion. He exhibits no edema.  Lymphadenopathy:    He has no cervical adenopathy.  Neurological: He is alert and oriented to person, place, and time. Coordination normal.  Skin: Skin is warm and dry. No rash (No rash  currently but he does have pictures of what it looks like on his phone and it does look like hives and wheals) noted. He is not diaphoretic.  Psychiatric: He has a normal mood and affect. His behavior is normal.  Nursing note and vitals reviewed.       Assessment & Plan:   Problem List Items Addressed This Visit    None    Visit Diagnoses    Hives    -  Primary      Recommended keeping the Benadryl and doing conservative management for now, if recurs again that we may have to go see an allergist for testing. Follow up plan: Return if symptoms worsen or fail to improve.  Counseling provided for all of the vaccine components No orders of the defined types were placed in this encounter.   Caryl Pina, MD [facility] 07/26/2018, 8:42 PM

## 2018-08-24 DIAGNOSIS — Z0289 Encounter for other administrative examinations: Secondary | ICD-10-CM

## 2018-09-07 DIAGNOSIS — M1711 Unilateral primary osteoarthritis, right knee: Secondary | ICD-10-CM | POA: Diagnosis not present

## 2018-09-07 DIAGNOSIS — M25561 Pain in right knee: Secondary | ICD-10-CM | POA: Diagnosis not present

## 2018-09-07 DIAGNOSIS — M170 Bilateral primary osteoarthritis of knee: Secondary | ICD-10-CM | POA: Diagnosis not present

## 2018-09-07 DIAGNOSIS — M25562 Pain in left knee: Secondary | ICD-10-CM | POA: Diagnosis not present

## 2018-09-09 DIAGNOSIS — M25562 Pain in left knee: Secondary | ICD-10-CM | POA: Diagnosis not present

## 2018-09-09 DIAGNOSIS — M1712 Unilateral primary osteoarthritis, left knee: Secondary | ICD-10-CM | POA: Diagnosis not present

## 2018-09-14 DIAGNOSIS — M25561 Pain in right knee: Secondary | ICD-10-CM | POA: Diagnosis not present

## 2018-09-14 DIAGNOSIS — M1711 Unilateral primary osteoarthritis, right knee: Secondary | ICD-10-CM | POA: Diagnosis not present

## 2018-09-16 DIAGNOSIS — M25562 Pain in left knee: Secondary | ICD-10-CM | POA: Diagnosis not present

## 2018-09-16 DIAGNOSIS — M1712 Unilateral primary osteoarthritis, left knee: Secondary | ICD-10-CM | POA: Diagnosis not present

## 2018-09-21 DIAGNOSIS — M25561 Pain in right knee: Secondary | ICD-10-CM | POA: Diagnosis not present

## 2018-09-21 DIAGNOSIS — M1711 Unilateral primary osteoarthritis, right knee: Secondary | ICD-10-CM | POA: Diagnosis not present

## 2018-09-23 DIAGNOSIS — M1712 Unilateral primary osteoarthritis, left knee: Secondary | ICD-10-CM | POA: Diagnosis not present

## 2018-09-23 DIAGNOSIS — M25562 Pain in left knee: Secondary | ICD-10-CM | POA: Diagnosis not present

## 2018-10-08 DIAGNOSIS — M25562 Pain in left knee: Secondary | ICD-10-CM | POA: Diagnosis not present

## 2018-10-08 DIAGNOSIS — M1712 Unilateral primary osteoarthritis, left knee: Secondary | ICD-10-CM | POA: Diagnosis not present

## 2018-10-11 ENCOUNTER — Other Ambulatory Visit: Payer: Self-pay | Admitting: Family Medicine

## 2018-10-11 DIAGNOSIS — M1811 Unilateral primary osteoarthritis of first carpometacarpal joint, right hand: Secondary | ICD-10-CM

## 2018-10-11 DIAGNOSIS — M1711 Unilateral primary osteoarthritis, right knee: Secondary | ICD-10-CM

## 2018-10-11 DIAGNOSIS — M1812 Unilateral primary osteoarthritis of first carpometacarpal joint, left hand: Secondary | ICD-10-CM

## 2019-02-17 ENCOUNTER — Other Ambulatory Visit: Payer: Self-pay | Admitting: Family Medicine

## 2019-02-17 DIAGNOSIS — M1711 Unilateral primary osteoarthritis, right knee: Secondary | ICD-10-CM

## 2019-02-17 DIAGNOSIS — M1812 Unilateral primary osteoarthritis of first carpometacarpal joint, left hand: Secondary | ICD-10-CM

## 2019-02-17 DIAGNOSIS — M1811 Unilateral primary osteoarthritis of first carpometacarpal joint, right hand: Secondary | ICD-10-CM

## 2019-03-15 ENCOUNTER — Other Ambulatory Visit: Payer: Self-pay

## 2019-03-17 ENCOUNTER — Other Ambulatory Visit: Payer: Self-pay

## 2019-03-18 ENCOUNTER — Ambulatory Visit (INDEPENDENT_AMBULATORY_CARE_PROVIDER_SITE_OTHER): Payer: Medicare Other | Admitting: Family Medicine

## 2019-03-18 ENCOUNTER — Encounter: Payer: Self-pay | Admitting: Family Medicine

## 2019-03-18 VITALS — BP 138/79 | HR 66 | Temp 98.2°F | Ht 70.5 in | Wt 231.8 lb

## 2019-03-18 DIAGNOSIS — M1711 Unilateral primary osteoarthritis, right knee: Secondary | ICD-10-CM

## 2019-03-18 MED ORDER — METHYLPREDNISOLONE ACETATE 80 MG/ML IJ SUSP
80.0000 mg | Freq: Once | INTRAMUSCULAR | Status: AC
  Administered 2019-03-18: 80 mg via INTRAMUSCULAR

## 2019-03-18 NOTE — Progress Notes (Signed)
BP 138/79   Pulse 66   Temp 98.2 F (36.8 C) (Temporal)   Ht 5' 10.5" (1.791 m)   Wt 231 lb 12.8 oz (105.1 kg)   BMI 32.79 kg/m    Subjective:   Patient ID: Gregory Pearson, male    DOB: 17-Aug-1928, 85 y.o.   MRN: [id number]  HPI: [name] is a 85 y.o. male presenting on 03/18/2019 for Knee Pain (right- Patient is request a knee injection. Patient states it has been going on x 2-3 years.)   HPI Right knee pain Patient is coming in complaining of right knee pain, is been going on for 2 or 3 years and he feels like is not getting better.  The left knee got a lot better with the first injections but the right knee not as much and he feels like it is especially been bothering him over the past couple months.  He does have to move around a lot and take care of his wife and then he tries to garden and he gets irritated and swollen in that knee sometimes and it gets inflamed sometimes.  He denies any fevers or chills or redness or warmth or popping or catching or giving way.  Relevant past medical, surgical, family and social history reviewed and updated as indicated. Interim medical history since our last visit reviewed. Allergies and medications reviewed and updated.  Review of Systems  Constitutional: Negative for chills and fever.  Respiratory: Negative for shortness of breath and wheezing.   Cardiovascular: Negative for chest pain and leg swelling.  Musculoskeletal: Positive for arthralgias. Negative for back pain, gait problem and joint swelling.  Skin: Negative for rash.  All other systems reviewed and are negative.   Per HPI unless specifically indicated above   Allergies as of 03/18/2019   No Known Allergies     Medication List       Accurate as of March 18, 2019 11:40 AM. If you have any questions, ask your nurse or doctor.        diclofenac sodium 1 % Gel Commonly known as: Voltaren Apply 2 g topically 4 (four) times daily.   meloxicam 15 MG tablet Commonly  known as: MOBIC TAKE 1 TABLET BY MOUTH  DAILY   multivitamin tablet Take 1 tablet by mouth daily.   Omega-3 1000 MG Caps Take 2,000 mg by mouth daily.   PROBIOTIC DAILY PO Take by mouth.        Objective:   BP 138/79   Pulse 66   Temp 98.2 F (36.8 C) (Temporal)   Ht 5' 10.5" (1.791 m)   Wt 231 lb 12.8 oz (105.1 kg)   BMI 32.79 kg/m   Wt Readings from Last 3 Encounters:  03/18/19 231 lb 12.8 oz (105.1 kg)  07/20/18 236 lb 6.4 oz (107.2 kg)  05/21/18 234 lb (106.1 kg)    Physical Exam Vitals signs and nursing note reviewed.  Constitutional:      General: He is not in acute distress.    Appearance: He is well-developed. He is not diaphoretic.  Eyes:     General: No scleral icterus.    Conjunctiva/sclera: Conjunctivae normal.  Musculoskeletal: Normal range of motion.     Right knee: He exhibits normal range of motion, no swelling, no effusion, normal alignment, no LCL laxity, normal patellar mobility, normal meniscus and no MCL laxity. Tenderness found. Medial joint line and lateral joint line tenderness noted.  Neurological:     Mental Status: He is  alert and oriented to person, place, and time.     Coordination: Coordination normal.  Psychiatric:        Behavior: Behavior normal.    Knee injection: Consent form signed. Risk factors of bleeding and infection discussed with patient and patient is agreeable towards injection. Patient prepped with Betadine. Lateral approach towards injection used. Injected 80 mg of Depo-Medrol and 1 mL of 2% lidocaine. Patient tolerated procedure well and no side effects from noted. Minimal to no bleeding. Simple bandage applied after.    Assessment & Plan:   Problem List Items Addressed This Visit    None    Visit Diagnoses    Primary osteoarthritis of right knee    -  Primary   Relevant Medications   methylPREDNISolone acetate (DEPO-MEDROL) injection 80 mg (Start on 03/18/2019 11:45 AM)       Follow up plan: Return if  symptoms worsen or fail to improve.  Counseling provided for all of the vaccine components No orders of the defined types were placed in this encounter.   Caryl Pina, MD [facility] 03/18/2019, 11:40 AM

## 2019-04-22 ENCOUNTER — Other Ambulatory Visit: Payer: Self-pay

## 2019-04-28 ENCOUNTER — Other Ambulatory Visit: Payer: Self-pay

## 2019-04-29 ENCOUNTER — Ambulatory Visit (INDEPENDENT_AMBULATORY_CARE_PROVIDER_SITE_OTHER): Payer: Medicare Other | Admitting: *Deleted

## 2019-04-29 DIAGNOSIS — Z23 Encounter for immunization: Secondary | ICD-10-CM

## 2019-05-18 DIAGNOSIS — H40033 Anatomical narrow angle, bilateral: Secondary | ICD-10-CM | POA: Diagnosis not present

## 2019-05-18 DIAGNOSIS — H2513 Age-related nuclear cataract, bilateral: Secondary | ICD-10-CM | POA: Diagnosis not present

## 2019-06-27 DIAGNOSIS — K259 Gastric ulcer, unspecified as acute or chronic, without hemorrhage or perforation: Secondary | ICD-10-CM

## 2019-06-27 HISTORY — DX: Gastric ulcer, unspecified as acute or chronic, without hemorrhage or perforation: K25.9

## 2019-07-06 ENCOUNTER — Inpatient Hospital Stay (HOSPITAL_COMMUNITY)
Admission: EM | Admit: 2019-07-06 | Discharge: 2019-07-07 | DRG: 379 | Disposition: A | Discharge status: Home Health | Payer: Medicare Other | Attending: Family Medicine | Admitting: Family Medicine

## 2019-07-06 ENCOUNTER — Encounter (HOSPITAL_COMMUNITY): Payer: Self-pay | Admitting: *Deleted

## 2019-07-06 ENCOUNTER — Emergency Department (HOSPITAL_COMMUNITY): Payer: Medicare Other

## 2019-07-06 ENCOUNTER — Other Ambulatory Visit: Payer: Self-pay

## 2019-07-06 ENCOUNTER — Encounter (HOSPITAL_COMMUNITY)
Admission: EM | Disposition: A | Discharge status: Home Health | Payer: Self-pay | Source: Home / Self Care | Attending: Internal Medicine

## 2019-07-06 DIAGNOSIS — K219 Gastro-esophageal reflux disease without esophagitis: Secondary | ICD-10-CM | POA: Diagnosis present

## 2019-07-06 DIAGNOSIS — K921 Melena: Secondary | ICD-10-CM | POA: Diagnosis not present

## 2019-07-06 DIAGNOSIS — D649 Anemia, unspecified: Secondary | ICD-10-CM | POA: Diagnosis present

## 2019-07-06 DIAGNOSIS — I959 Hypotension, unspecified: Secondary | ICD-10-CM | POA: Diagnosis not present

## 2019-07-06 DIAGNOSIS — Z20828 Contact with and (suspected) exposure to other viral communicable diseases: Secondary | ICD-10-CM | POA: Diagnosis present

## 2019-07-06 DIAGNOSIS — Z9049 Acquired absence of other specified parts of digestive tract: Secondary | ICD-10-CM | POA: Diagnosis not present

## 2019-07-06 DIAGNOSIS — Z9079 Acquired absence of other genital organ(s): Secondary | ICD-10-CM | POA: Diagnosis not present

## 2019-07-06 DIAGNOSIS — K254 Chronic or unspecified gastric ulcer with hemorrhage: Secondary | ICD-10-CM | POA: Diagnosis present

## 2019-07-06 DIAGNOSIS — K3189 Other diseases of stomach and duodenum: Secondary | ICD-10-CM | POA: Diagnosis not present

## 2019-07-06 DIAGNOSIS — E785 Hyperlipidemia, unspecified: Secondary | ICD-10-CM | POA: Diagnosis present

## 2019-07-06 DIAGNOSIS — R531 Weakness: Secondary | ICD-10-CM | POA: Diagnosis not present

## 2019-07-06 DIAGNOSIS — Z79899 Other long term (current) drug therapy: Secondary | ICD-10-CM

## 2019-07-06 DIAGNOSIS — K922 Gastrointestinal hemorrhage, unspecified: Secondary | ICD-10-CM | POA: Diagnosis present

## 2019-07-06 DIAGNOSIS — G8929 Other chronic pain: Secondary | ICD-10-CM | POA: Diagnosis present

## 2019-07-06 DIAGNOSIS — M1710 Unilateral primary osteoarthritis, unspecified knee: Secondary | ICD-10-CM | POA: Diagnosis present

## 2019-07-06 DIAGNOSIS — Z8546 Personal history of malignant neoplasm of prostate: Secondary | ICD-10-CM | POA: Diagnosis not present

## 2019-07-06 DIAGNOSIS — K259 Gastric ulcer, unspecified as acute or chronic, without hemorrhage or perforation: Secondary | ICD-10-CM | POA: Diagnosis not present

## 2019-07-06 DIAGNOSIS — Z791 Long term (current) use of non-steroidal anti-inflammatories (NSAID): Secondary | ICD-10-CM

## 2019-07-06 HISTORY — PX: BIOPSY: SHX5522

## 2019-07-06 HISTORY — PX: ESOPHAGOGASTRODUODENOSCOPY: SHX5428

## 2019-07-06 LAB — CBC WITH DIFFERENTIAL/PLATELET
Abs Immature Granulocytes: 0.02 10*3/uL (ref 0.00–0.07)
Basophils Absolute: 0.1 10*3/uL (ref 0.0–0.1)
Basophils Relative: 1 %
Eosinophils Absolute: 0 10*3/uL (ref 0.0–0.5)
Eosinophils Relative: 0 %
HCT: 39.7 % (ref 39.0–52.0)
Hemoglobin: 12.7 g/dL — ABNORMAL LOW (ref 13.0–17.0)
Immature Granulocytes: 0 %
Lymphocytes Relative: 14 %
Lymphs Abs: 1.3 10*3/uL (ref 0.7–4.0)
MCH: 32.5 pg (ref 26.0–34.0)
MCHC: 32 g/dL (ref 30.0–36.0)
MCV: 101.5 fL — ABNORMAL HIGH (ref 80.0–100.0)
Monocytes Absolute: 0.5 10*3/uL (ref 0.1–1.0)
Monocytes Relative: 6 %
Neutro Abs: 6.9 10*3/uL (ref 1.7–7.7)
Neutrophils Relative %: 79 %
Platelets: 165 10*3/uL (ref 150–400)
RBC: 3.91 MIL/uL — ABNORMAL LOW (ref 4.22–5.81)
RDW: 13.2 % (ref 11.5–15.5)
WBC: 8.8 10*3/uL (ref 4.0–10.5)
nRBC: 0 % (ref 0.0–0.2)

## 2019-07-06 LAB — TYPE AND SCREEN
ABO/RH(D): O POS
Antibody Screen: NEGATIVE

## 2019-07-06 LAB — HEMOGLOBIN AND HEMATOCRIT, BLOOD
HCT: 38.1 % — ABNORMAL LOW (ref 39.0–52.0)
Hemoglobin: 12 g/dL — ABNORMAL LOW (ref 13.0–17.0)

## 2019-07-06 LAB — COMPREHENSIVE METABOLIC PANEL
ALT: 17 U/L (ref 0–44)
AST: 18 U/L (ref 15–41)
Albumin: 3.9 g/dL (ref 3.5–5.0)
Alkaline Phosphatase: 52 U/L (ref 38–126)
Anion gap: 10 (ref 5–15)
BUN: 64 mg/dL — ABNORMAL HIGH (ref 8–23)
CO2: 25 mmol/L (ref 22–32)
Calcium: 8.2 mg/dL — ABNORMAL LOW (ref 8.9–10.3)
Chloride: 107 mmol/L (ref 98–111)
Creatinine, Ser: 0.84 mg/dL (ref 0.61–1.24)
GFR calc Af Amer: >60 mL/min (ref >60)
GFR calc non Af Amer: >60 mL/min (ref >60)
Glucose, Bld: 171 mg/dL — ABNORMAL HIGH (ref 70–99)
Potassium: 4 mmol/L (ref 3.5–5.1)
Sodium: 142 mmol/L (ref 135–145)
Total Bilirubin: 1 mg/dL (ref 0.3–1.2)
Total Protein: 6.5 g/dL (ref 6.5–8.1)

## 2019-07-06 LAB — SARS CORONAVIRUS 2 BY RT PCR (HOSPITAL ORDER, PERFORMED IN ~~LOC~~ HOSPITAL LAB): SARS Coronavirus 2: NEGATIVE

## 2019-07-06 LAB — MRSA PCR SCREENING: MRSA by PCR: NEGATIVE

## 2019-07-06 LAB — PROTIME-INR
INR: 1.1 (ref 0.8–1.2)
Prothrombin Time: 13.8 seconds (ref 11.4–15.2)

## 2019-07-06 LAB — POC OCCULT BLOOD, ED: Fecal Occult Bld: POSITIVE — AB

## 2019-07-06 SURGERY — EGD (ESOPHAGOGASTRODUODENOSCOPY)
Anesthesia: Moderate Sedation

## 2019-07-06 MED ORDER — MIDAZOLAM HCL 5 MG/5ML IJ SOLN
INTRAMUSCULAR | Status: AC
  Filled 2019-07-06: qty 5

## 2019-07-06 MED ORDER — ONDANSETRON HCL 4 MG PO TABS: 4.0000 mg | ORAL_TABLET | Freq: Four times a day (QID) | ORAL | Status: DC | PRN

## 2019-07-06 MED ORDER — MEPERIDINE HCL 50 MG/ML IJ SOLN
INTRAMUSCULAR | Status: AC
  Filled 2019-07-06: qty 1

## 2019-07-06 MED ORDER — ACETAMINOPHEN 650 MG RE SUPP: 650.0000 mg | Freq: Four times a day (QID) | RECTAL | Status: DC | PRN

## 2019-07-06 MED ORDER — ACETAMINOPHEN 325 MG PO TABS: 650.0000 mg | ORAL_TABLET | Freq: Four times a day (QID) | ORAL | Status: DC | PRN

## 2019-07-06 MED ORDER — PANTOPRAZOLE SODIUM 40 MG IV SOLR
40.0000 mg | Freq: Once | INTRAVENOUS | Status: AC
  Administered 2019-07-06: 40 mg via INTRAVENOUS
  Filled 2019-07-06: qty 40

## 2019-07-06 MED ORDER — SODIUM CHLORIDE 0.9 % IV BOLUS
1000.0000 mL | Freq: Once | INTRAVENOUS | Status: AC
  Administered 2019-07-06: 1000 mL via INTRAVENOUS

## 2019-07-06 MED ORDER — ONDANSETRON HCL 4 MG/2ML IJ SOLN
INTRAMUSCULAR | Status: DC | PRN
  Administered 2019-07-06: 4 mg via INTRAVENOUS

## 2019-07-06 MED ORDER — SODIUM CHLORIDE 0.9 % IV SOLN: INTRAVENOUS | Status: DC

## 2019-07-06 MED ORDER — ONDANSETRON HCL 4 MG/2ML IJ SOLN
INTRAMUSCULAR | Status: AC
  Filled 2019-07-06: qty 2

## 2019-07-06 MED ORDER — PANTOPRAZOLE SODIUM 40 MG IV SOLR
40.0000 mg | Freq: Two times a day (BID) | INTRAVENOUS | Status: DC
  Administered 2019-07-06: 40 mg via INTRAVENOUS
  Filled 2019-07-06: qty 40

## 2019-07-06 MED ORDER — LIDOCAINE VISCOUS HCL 2 % MT SOLN
OROMUCOSAL | Status: AC
  Filled 2019-07-06: qty 15

## 2019-07-06 MED ORDER — LACTATED RINGERS IV SOLN
INTRAVENOUS | Status: DC
  Administered 2019-07-06 – 2019-07-07 (×3): via INTRAVENOUS

## 2019-07-06 MED ORDER — MIDAZOLAM HCL 5 MG/5ML IJ SOLN
INTRAMUSCULAR | Status: DC | PRN
  Administered 2019-07-06 (×2): 1 mg via INTRAVENOUS

## 2019-07-06 MED ORDER — ONDANSETRON HCL 4 MG/2ML IJ SOLN: 4.0000 mg | Freq: Four times a day (QID) | INTRAMUSCULAR | Status: DC | PRN

## 2019-07-06 MED ORDER — MEPERIDINE HCL 100 MG/ML IJ SOLN
INTRAMUSCULAR | Status: DC | PRN
  Administered 2019-07-06: 25 mg

## 2019-07-06 MED ORDER — LIDOCAINE VISCOUS HCL 2 % MT SOLN
OROMUCOSAL | Status: DC | PRN
  Administered 2019-07-06: 1 via OROMUCOSAL

## 2019-07-06 NOTE — ED Notes (Signed)
Patient became unstable while standing and stated that he felt like he was going to black out during orthostatic vital signs

## 2019-07-06 NOTE — ED Triage Notes (Signed)
Pt brought in by [facility] with c/o black stool and weakness that started yesterday. Pt reports 5 BM since it started. HR 110, BP 111/73 for EMS.

## 2019-07-06 NOTE — Op Note (Addendum)
[facility] Patient Name: Gregory Pearson Procedure Date: 07/06/2019 2:50 PM MRN: [id number] Date of Birth: [birth year] Attending MD: Norvel Richards , MD CSN: [id number] Age: 85 Admit Type: Inpatient Procedure:                Upper GI endoscopy Indications:              Melena Providers:                Norvel Richards, MD, Janeece Riggers, RN, Randa Spike, Technician Referring MD:              Medicines:                Midazolam 2 mg IV, Meperidine 25 mg IV Complications:            No immediate complications. Estimated Blood Loss:     Estimated blood loss was minimal. Procedure:                Pre-Anesthesia Assessment:                           - Prior to the procedure, a History and Physical                            was performed, and patient medications and                            allergies were reviewed. The patient's tolerance of                            previous anesthesia was also reviewed. The risks                            and benefits of the procedure and the sedation                            options and risks were discussed with the patient.                            All questions were answered, and informed consent                            was obtained. Prior Anticoagulants: The patient has                            taken no previous anticoagulant or antiplatelet                            agents. ASA Grade Assessment: III - A patient with                            severe systemic disease. After reviewing the risks  and benefits, the patient was deemed in                            satisfactory condition to undergo the procedure.                           After obtaining informed consent, the endoscope was                            passed under direct vision. Throughout the                            procedure, the patient's blood pressure, pulse, and                            oxygen  saturations were monitored continuously. The                            [id number]) scope was introduced through the                            mouth, and advanced to the second part of duodenum.                            The upper GI endoscopy was accomplished without                            difficulty. The patient tolerated the procedure                            well. Scope In: 3:03:10 PM Scope Out: 3:10:06 PM Total Procedure Duration: 0 hours 6 minutes 56 seconds  Findings:      The examined esophagus was normal.      One non-bleeding cratered gastric ulcer with tiny amount of pigmented       material at the edge of the crater clean base. No bleeding stigmata.       Gastric antrum. The lesion was 12 mm in largest dimension. Clean base       ulcer      Patchy moderately erythematous mucosa was found in the entire examined       stomach. Gastric mucosal biopsy taken for H. pylori      The duodenal bulb and second portion of the duodenum were normal. Impression:               - Normal esophagus.                           - Non-bleeding gastric ulcer with pigmented                            material. Therapeutic intervention not needed.                           -Abnormal gastric mucosa of uncertain  significance?status post biopsy.                           - Normal duodenal bulb and second portion of the                            duodenum. Moderate Sedation:      Moderate (conscious) sedation was administered by the endoscopy nurse       and supervised by the endoscopist. The following parameters were       monitored: oxygen saturation, heart rate, blood pressure, respiratory       rate, EKG, adequacy of pulmonary ventilation, and response to care.       Total physician intraservice time was 25 minutes. Recommendation:           - Return patient to ward for observation.                           - Clear liquid diet. Monitor overnight. Protonix  40                            mg twice daily x3 months; if doing okay in the                            morning, can advance diet and discharge tomorrow                            from a GI standpoint                           - Continue present medications.                           - Await pathology results.                           - Repeat upper endoscopy in 3 months for                            surveillance. Please, nonsteroidal agents are                            absolutely contraindicated in this patient moving                            forward.                           - Return to GI clinic in 3 months. I called                            patient's son, Jiles Prows, at [phone] but was                            unable to reach him. Procedure Code(s):        --- Professional ---  [id number], Esophagogastroduodenoscopy, flexible,                            transoral; diagnostic, including collection of                            specimen(s) by brushing or washing, when performed                            (separate procedure)                           99153, Moderate sedation; each additional 15                            minutes intraservice time                           G0500, Moderate sedation services provided by the                            same physician or other qualified health care                            professional performing a gastrointestinal                            endoscopic service that sedation supports,                            requiring the presence of an independent trained                            observer to assist in the monitoring of the                            patient's level of consciousness and physiological                            status; initial 15 minutes of intra-service time;                            patient age 66 years or older (additional time may                            be reported with [phone], as  appropriate) Diagnosis Code(s):        --- Professional ---                           K25.9, Gastric ulcer, unspecified as acute or                            chronic, without hemorrhage or perforation                           K31.89,  Other diseases of stomach and duodenum                           K92.1, Melena (includes Hematochezia) CPT copyright 2019 [employer] Association. All rights reserved. The codes documented in this report are preliminary and upon coder review may  be revised to meet current compliance requirements. Cristopher Estimable. Karem Tomaso, MD Norvel Richards, MD 07/06/2019 3:22:56 PM This report has been signed electronically. Number of Addenda: 0

## 2019-07-06 NOTE — Consult Note (Signed)
Referring Provider: [facility] Primary Care Physician:  Dettinger, Fransisca Kaufmann, MD Primary Gastroenterologist:  Dr. Gala Romney  Date of Admission: 07/06/19 Date of Consultation: 07/06/19  Reason for Consultation:  Melena  HPI:  Gregory Pearson is a 85 y.o. year old male with past medical history significant for prior GI bleed in the setting of gastric ulcer in 2018 (likely NSAID related) with remote history of gastric ulcer in 1995, knee arthritis on Mobic, and diverticulosis who presented to the ED with generalized weakness and black tarry stools that began yesterday.   ED course: Vital signs overall stable with some soft blood pressure readings and mild tachycardia.  Stool occult blood positive.  Hemoglobin 12.7 (down from 14.6 in May 2019).  BUN elevated at 64. Chest x-ray with no acute findings noted.  He has been given 1 L saline bolus as well as IV PPI.   Patient last seen by our staff at the time of repeat EGD in 2018 for gastric ulcer.  He had been doing well and reported stopping all NSAIDs and denied aspirin powders. EGD on 01/23/2017 with previously noted gastric ulcer healed with residual scarred mucosa, otherwise normal stomach mucosa, normal esophagus,  normal duodenal bulb and second portion of the duodenum.  Protonix was decreased to once daily at that time and patient was advised to avoid all NSAIDs.  Today he states black stool started yesterday. Had 4-5 during the day. Then took milk of magnesia to clean himself out and had additional 5-6 black stools that started 10-11 pm with last BM around 4 am. Denies abdominal pain. No bright red blood per rectum. No nausea or vomiting. No heartburn or acid reflux. No dysphagia. No unintentional weight loss. No fever. Occasional chills. Had lightheadedness when he was up walking around early this morning. No syncope. Admits to shortness of breath with exertion that has been worse of the last couple of days. Typically he can get out and do a  little gardening and take care of his lawn with his riding lawn mower.  Denies chest pain or heart palpitations.  No Aleve, Advil, ibuprofen, goody powders. Has been taking Mobic daily for a couple of years. No Protonix in about 6 months.   Last meal was yesterday morning. Had oatmeal. Last drank anything around 8-9 pm last night.   Past Medical History:  Diagnosis Date  . Asbestos exposure   . Blood transfusion without reported diagnosis    after ruptured appendix 1963  . Diverticulosis   . GERD (gastroesophageal reflux disease)   . History of stomach ulcers 1995   H.pylori s/p treatment  . History of stomach ulcers   . Hyperlipidemia   . Muscle strain of right gluteal region 01/24/2014  . Prostate cancer [facility]) 2002   prostatectomy  . Traumatic hematoma of buttock 01/24/2014    Past Surgical History:  Procedure Laterality Date  . APPENDECTOMY  1963   ruptured appendix/gangrene  . COLONOSCOPY  2013   Dr. Fuller Plan: multiple colon polyps (tubular adenomas), diverticulosis. no further surveillance colonoscopies due to age.  . ESOPHAGOGASTRODUODENOSCOPY  [id number]   Dr. Fuller Plan: gastric ulcer (h.pylori + s/p tx), follow up EGD verified ulcer healing  . ESOPHAGOGASTRODUODENOSCOPY N/A 09/26/2016   Procedure: ESOPHAGOGASTRODUODENOSCOPY (EGD);  Surgeon: Daneil Dolin, MD;  Location: [facility];  Service: Endoscopy;  Laterality: N/A;  . ESOPHAGOGASTRODUODENOSCOPY N/A 01/23/2017   Procedure: ESOPHAGOGASTRODUODENOSCOPY (EGD);  Surgeon: Daneil Dolin, MD;  Location: [facility];  Service: Endoscopy;  Laterality: N/A;  12:15pm  .  [place]  . LAPAROSCOPIC CHOLECYSTECTOMY  2003  . PROSTATECTOMY  2002    Prior to Admission medications   Medication Sig Start Date End Date Taking? Authorizing Provider  meloxicam (MOBIC) 15 MG tablet TAKE 1 TABLET BY MOUTH  DAILY Patient taking differently: Take 15 mg by mouth daily.  02/18/19  Yes Dettinger, Fransisca Kaufmann, MD  Multiple Vitamin  (MULTIVITAMIN) tablet Take 1 tablet by mouth daily.   Yes [provider]  Omega-3 1000 MG CAPS Take 2,000 mg by mouth daily.   Yes [provider]  diclofenac sodium (VOLTAREN) 1 % GEL Apply 2 g topically 4 (four) times daily. Patient not taking: Reported on 07/06/2019 05/21/18   Dettinger, Fransisca Kaufmann, MD  Probiotic Product (PROBIOTIC DAILY PO) Take by mouth.    [provider]    Current Facility-Administered Medications  Medication Dose Route Frequency Provider Last Rate Last Dose  . acetaminophen (TYLENOL) tablet 650 mg  650 mg Oral Q6H PRN Manuella Ghazi, Pratik D, DO       Or  . acetaminophen (TYLENOL) suppository 650 mg  650 mg Rectal Q6H PRN Manuella Ghazi, Pratik D, DO      . lactated ringers infusion   Intravenous Continuous Heath Lark D, DO 75 mL/hr at 07/06/19 1319    . ondansetron (ZOFRAN) tablet 4 mg  4 mg Oral Q6H PRN Manuella Ghazi, Pratik D, DO       Or  . ondansetron (ZOFRAN) injection 4 mg  4 mg Intravenous Q6H PRN Manuella Ghazi, Pratik D, DO      . pantoprazole (PROTONIX) injection 40 mg  40 mg Intravenous Q12H Shah, Pratik D, DO       Current Outpatient Medications  Medication Sig Dispense Refill  . meloxicam (MOBIC) 15 MG tablet TAKE 1 TABLET BY MOUTH  DAILY (Patient taking differently: Take 15 mg by mouth daily. ) 90 tablet 1  . Multiple Vitamin (MULTIVITAMIN) tablet Take 1 tablet by mouth daily.    . Omega-3 1000 MG CAPS Take 2,000 mg by mouth daily.    . diclofenac sodium (VOLTAREN) 1 % GEL Apply 2 g topically 4 (four) times daily. (Patient not taking: Reported on 07/06/2019) 100 g 2  . Probiotic Product (PROBIOTIC DAILY PO) Take by mouth.      Allergies as of 07/06/2019  . (No Known Allergies)    Family History  Problem Relation Age of Onset  . Prostate cancer Brother   . Prostate cancer Brother   . Suicidality Brother   . Prostate cancer Brother   . Stomach cancer Neg Hx   . Colon cancer Neg Hx     Social History   Socioeconomic History  . Marital status:  Married    Spouse name: Not on file  . Number of children: Not on file  . Years of education: Not on file  . Highest education level: Not on file  Occupational History  . Occupation: retired Retail buyer  Social Needs  . Financial resource strain: Not on file  . Food insecurity    Worry: Not on file    Inability: Not on file  . Transportation needs    Medical: Not on file    Non-medical: Not on file  Tobacco Use  . Smoking status: Never Smoker  . Smokeless tobacco: Never Used  Substance and Sexual Activity  . Alcohol use: Not Currently    Frequency: Never  . Drug use: Never  . Sexual activity: Not on file  Lifestyle  . Physical  activity    Days per week: Not on file    Minutes per session: Not on file  . Stress: Not on file  Relationships  . Social Herbalist on phone: Not on file    Gets together: Not on file    Attends religious service: Not on file    Active member of club or organization: Not on file    Attends meetings of clubs or organizations: Not on file    Relationship status: Not on file  . Intimate partner violence    Fear of current or ex partner: Not on file    Emotionally abused: Not on file    Physically abused: Not on file    Forced sexual activity: Not on file  Other Topics Concern  . Not on file  Social History Narrative  . Not on file    Review of Systems: Gen: See HPI CV: See HPI Resp: See HPI GI: See HPI GU : Denies urinary burning, urinary frequency, urinary incontinence.  MS: Reports weakness in his knees.  Derm: Denies rash Psych: Denies depression, anxiety Heme: See HPI  Physical Exam: Vital signs in last 24 hours: Temp:  [98 F (36.7 C)] 98 F (36.7 C) (11/10 0838) Pulse Rate:  [90-109] 91 (11/10 1130) Resp:  [15-21] 15 (11/10 1130) BP: (109-124)/(66-80) 116/79 (11/10 1130) SpO2:  [90 %-99 %] 97 % (11/10 1130) Weight:  [104.3 kg] 104.3 kg (11/10 0834)   General:   Alert,  Well-developed, well-nourished, pleasant  and cooperative in NAD Head:  Normocephalic and atraumatic. Eyes:  Sclera clear, no icterus.   Conjunctiva pink. Ears:  Normal auditory acuity. Nose:  No deformity, discharge,  or lesions. Lungs:  Clear throughout to auscultation.   No wheezes, crackles, or rhonchi. No acute distress. Heart:  Regular rate and rhythm; no murmurs, clicks, rubs,  or gallops. Abdomen:  Soft, nontender and nondistended. No masses, hepatosplenomegaly or hernias noted. Normal bowel sounds, without guarding, and without rebound.   Rectal:  Deferred    Msk:  Symmetrical without gross deformities. Normal posture. Extremities: 1+ bilateral lower extremity edema. Neurologic:  Alert and  oriented x4;  grossly normal neurologically. Skin:  Intact without significant lesions or rashes. Psych:  Normal mood and affect.  Lab Results: Recent Labs    07/06/19 0900  WBC 8.8  HGB 12.7*  HCT 39.7  PLT 165   BMET Recent Labs    07/06/19 0900  NA 142  K 4.0  CL 107  CO2 25  GLUCOSE 171*  BUN 64*  CREATININE 0.84  CALCIUM 8.2*   LFT Recent Labs    07/06/19 0900  PROT 6.5  ALBUMIN 3.9  AST 18  ALT 17  ALKPHOS 52  BILITOT 1.0   PT/INR Recent Labs    07/06/19 0900  LABPROT 13.8  INR 1.1    Studies/Results: Dg Chest Portable 1 View  Result Date: 07/06/2019 CLINICAL DATA:  Weakness. EXAM: PORTABLE CHEST 1 VIEW COMPARISON:  Report of CT scan dated 06/01/2012 FINDINGS: The heart size and pulmonary vascularity are normal. Aortic atherosclerosis. Slight elevation of the right hemidiaphragm. No infiltrates or effusions. No acute bone abnormality. Old healed fracture of the posterior aspect of the left fourth rib. IMPRESSION: 1. No acute abnormalities. 2. Aortic atherosclerosis. 3. Slight elevation of the right hemidiaphragm. Electronically Signed   By: Lorriane Shire M.D.   On: 07/06/2019 09:29    Impression: 85 y.o. year old male with past medical history significant for  prior GI bleed in the setting of  gastric ulcer in 2018 (likely NSAID related) with remote history of gastric ulcer in 1995, knee arthritis on Mobic, and diverticulosis who presented to the ED with generalized weakness and black tarry stools that began yesterday.  Hemoglobin 12.7 (down from 14.6 in May 2019), stool occult blood positive, BUN elevated at 64.  Last black stool around 4 AM.  No BRBPR.  Associated lightheadedness without syncope, weakness, and worsening shortness of breath with exertion over the last couple of days.  Portable chest x-ray without acute abnormalities. Denies abdominal pain, nausea, vomiting, or GERD symptoms. Discontinued Protonix about 6 months ago.  Last EGD on 01/23/2017 for gastric ulcer follow-up with healed gastric ulcer with residual scarred mucosa, otherwise normal stomach mucosa, normal esophagus and duodenum.  Suspect NSAID induced peptic ulcer disease, gastritis, esophagitis, or duodenitis.  Patient needs EGD for further evaluation. Last meal yesterday morning.  Last had anything to drink around 8 or 9 PM last night.  Discussed with Dr. Gala Romney.  Plan to proceed with EGD later today.  Continue Protonix IV twice daily.   Plan: Proceed with upper endoscopy today Dr. Gala Romney. The risks, benefits, and alternatives have been discussed in detail with patient. They have stated understanding and desire to proceed.  Continue IV Protonix 40 mg BID Continue supportive measures  Continue NPO status Further recommendations to follow EGD    LOS: 0 days    07/06/2019, 1:29 PM   Aliene Altes, [facility]

## 2019-07-06 NOTE — H&P (Signed)
History and Physical    Tarek Mclane [id number] DOB: 1928-04-28 DOA: 07/06/2019  PCP: Dettinger, Fransisca Kaufmann, MD   Patient coming from: Home  Chief Complaint: Melena  HPI: Gregory Pearson is a 85 y.o. male with medical history significant for prior gastric ulcer with EGD in 2018, knee osteoarthritis on Mobic, and diverticulosis who presented to the ED with complaints of generalized weakness and black, tarry stools that began yesterday.  He was noted to have loose stools intermittently yesterday and states he had about 5 of them.  He took some milk of magnesia after seeing this.  He denies any abdominal pain, nausea, or vomiting and states that he just generally feels weak and has some diaphoresis and is on the verge of "blacking out" when he stands.  He continues to take his Mobic on a regular basis for his knee pain.  He denies any chest pain, fevers, chills, shortness of breath or syncopal episodes.  He also takes Tylenol occasionally to help assist with management of his knee pain.   ED Course: Stable vital signs noted with some soft blood pressure readings and mild tachycardia.  He is currently asymptomatic.  Stool occult is noted to be positive.  Hemoglobin is 12.7.  Chest x-ray with no acute findings noted.  He has been given 1 L saline bolus as well as IV PPI.  Review of Systems: All others reviewed as noted above and otherwise negative.  Past Medical History:  Diagnosis Date  . Asbestos exposure   . Blood transfusion without reported diagnosis    after ruptured appendix 1963  . Diverticulosis   . GERD (gastroesophageal reflux disease)   . History of stomach ulcers 1995   H.pylori s/p treatment  . History of stomach ulcers   . Hyperlipidemia   . Muscle strain of right gluteal region 01/24/2014  . Prostate cancer [facility]) 2002   prostatectomy  . Traumatic hematoma of buttock 01/24/2014    Past Surgical History:  Procedure Laterality Date  . APPENDECTOMY  1963   ruptured  appendix/gangrene  . COLONOSCOPY  2013   Dr. Fuller Plan: multiple colon polyps (tubular adenomas), diverticulosis. no further surveillance colonoscopies due to age.  . ESOPHAGOGASTRODUODENOSCOPY  [id number]   Dr. Fuller Plan: gastric ulcer (h.pylori + s/p tx), follow up EGD verified ulcer healing  . ESOPHAGOGASTRODUODENOSCOPY N/A 09/26/2016   Procedure: ESOPHAGOGASTRODUODENOSCOPY (EGD);  Surgeon: Daneil Dolin, MD;  [facility];  Service: Endoscopy;  Laterality: N/A;  . ESOPHAGOGASTRODUODENOSCOPY N/A 01/23/2017   Procedure: ESOPHAGOGASTRODUODENOSCOPY (EGD);  Surgeon: Daneil Dolin, MD;  Location: [facility];  Service: Endoscopy;  Laterality: N/A;  12:15pm  . [place]  . LAPAROSCOPIC CHOLECYSTECTOMY  2003  . PROSTATECTOMY  2002     reports that he has never smoked. He has never used smokeless tobacco. He reports that he does not drink alcohol or use drugs.  No Known Allergies  Family History  Problem Relation Age of Onset  . Prostate cancer Brother   . Prostate cancer Brother   . Suicidality Brother   . Prostate cancer Brother   . Stomach cancer Neg Hx   . Colon cancer Neg Hx     Prior to Admission medications   Medication Sig Start Date End Date Taking? Authorizing Provider  meloxicam (MOBIC) 15 MG tablet TAKE 1 TABLET BY MOUTH  DAILY Patient taking differently: Take 15 mg by mouth daily.  02/18/19  Yes Dettinger, Fransisca Kaufmann, MD  Multiple Vitamin (MULTIVITAMIN) tablet Take 1 tablet  by mouth daily.   Yes [provider]  Omega-3 1000 MG CAPS Take 2,000 mg by mouth daily.   Yes [provider]  diclofenac sodium (VOLTAREN) 1 % GEL Apply 2 g topically 4 (four) times daily. Patient not taking: Reported on 07/06/2019 05/21/18   Dettinger, Fransisca Kaufmann, MD  Probiotic Product (PROBIOTIC DAILY PO) Take by mouth.    [provider]    Physical Exam: Vitals:   07/06/19 0930 07/06/19 1030 07/06/19 1100 07/06/19 1130  BP: 109/66 120/80 111/74 116/79   Pulse: (!) 101 90 90 91  Resp: 20 (!) 21 18 15   Temp:      TempSrc:      SpO2: 97% 90% 97% 97%  Weight:      Height:        Constitutional: NAD, calm, comfortable Vitals:   07/06/19 0930 07/06/19 1030 07/06/19 1100 07/06/19 1130  BP: 109/66 120/80 111/74 116/79  Pulse: (!) 101 90 90 91  Resp: 20 (!) 21 18 15   Temp:      TempSrc:      SpO2: 97% 90% 97% 97%  Weight:      Height:       Eyes: lids and conjunctivae normal ENMT: Mucous membranes are moist.  Neck: normal, supple Respiratory: clear to auscultation bilaterally. Normal respiratory effort. No accessory muscle use.  Cardiovascular: Regular rate and rhythm, no murmurs. No extremity edema. Abdomen: no tenderness, no distention. Bowel sounds positive.  Musculoskeletal:  No joint deformity upper and lower extremities.   Skin: no rashes, lesions, ulcers.  Psychiatric: Normal judgment and insight. Alert and oriented x 3. Normal mood.   Labs on Admission: I have personally reviewed following labs and imaging studies  CBC: Recent Labs  Lab 07/06/19 0900  WBC 8.8  NEUTROABS 6.9  HGB 12.7*  HCT 39.7  MCV 101.5*  PLT 123XX123   Basic Metabolic Panel: Recent Labs  Lab 07/06/19 0900  NA 142  K 4.0  CL 107  CO2 25  GLUCOSE 171*  BUN 64*  CREATININE 0.84  CALCIUM 8.2*   GFR: Estimated Creatinine Clearance: 74.5 mL/min (by C-G formula based on SCr of 0.84 mg/dL). Liver Function Tests: Recent Labs  Lab 07/06/19 0900  AST 18  ALT 17  ALKPHOS 52  BILITOT 1.0  PROT 6.5  ALBUMIN 3.9   No results for input(s): LIPASE, AMYLASE in the last 168 hours. No results for input(s): AMMONIA in the last 168 hours. Coagulation Profile: Recent Labs  Lab 07/06/19 0900  INR 1.1   Cardiac Enzymes: No results for input(s): CKTOTAL, CKMB, CKMBINDEX, TROPONINI in the last 168 hours. BNP (last 3 results) No results for input(s): PROBNP in the last 8760 hours. HbA1C: No results for input(s): HGBA1C in the last 72 hours.  CBG: No results for input(s): GLUCAP in the last 168 hours. Lipid Profile: No results for input(s): CHOL, HDL, LDLCALC, TRIG, CHOLHDL, LDLDIRECT in the last 72 hours. Thyroid Function Tests: No results for input(s): TSH, T4TOTAL, FREET4, T3FREE, THYROIDAB in the last 72 hours. Anemia Panel: No results for input(s): VITAMINB12, FOLATE, FERRITIN, TIBC, IRON, RETICCTPCT in the last 72 hours. Urine analysis: No results found for: COLORURINE, APPEARANCEUR, LABSPEC, [place], GLUCOSEU, HGBUR, BILIRUBINUR, KETONESUR, PROTEINUR, UROBILINOGEN, NITRITE, LEUKOCYTESUR  Radiological Exams on Admission: Dg Chest Portable 1 View  Result Date: 07/06/2019 CLINICAL DATA:  Weakness. EXAM: PORTABLE CHEST 1 VIEW COMPARISON:  Report of CT scan dated 06/01/2012 FINDINGS: The heart size and pulmonary vascularity are normal. Aortic atherosclerosis. Slight  elevation of the right hemidiaphragm. No infiltrates or effusions. No acute bone abnormality. Old healed fracture of the posterior aspect of the left fourth rib. IMPRESSION: 1. No acute abnormalities. 2. Aortic atherosclerosis. 3. Slight elevation of the right hemidiaphragm. Electronically Signed   By: Lorriane Shire M.D.   On: 07/06/2019 09:29    EKG: Independently reviewed. ST 107bpm.  Assessment/Plan Active Problems:   GI bleed    Melena with suspicion of upper GI bleed -Patient has had prior gastric ulcer and does use NSAIDs regularly -Maintain on PPI IV twice daily -N.p.o. except sips and medications for now -Consultation to GI appreciated -SCDs -Repeat hemoglobin and hematocrit at 1800 and follow-up CBC a.m. -Transfuse for hemoglobin less than 7 -IV fluid  Mild anemia likely secondary to GI loss above -We will check anemia panel -Transfuse as needed and monitor closely  Knee osteoarthritis -Avoid NSAIDs   DVT prophylaxis: SCDs Code Status: Full Family Communication: None at bedside Disposition Plan:GI evaluation Consults called:GI  Admission status: Inpatient, Tele   Aasia Peavler Darleen Crocker DO [facility] Pager [phone]  If 7PM-7AM, please contact night-coverage [email or web address] Password [email or web address]  07/06/2019, 12:06 PM

## 2019-07-06 NOTE — ED Provider Notes (Signed)
[facility] EMERGENCY DEPARTMENT Provider Note   CSN: [id number] Arrival date & time: 07/06/19  0830     History   Chief Complaint Chief Complaint  Patient presents with  . Melena    HPI Gregory Pearson is a 85 y.o. male.     HPI   Gregory Pearson is a 85 y.o. male with past medical history of GERD, diverticulosis, peptic ulcers and previous GI bleed .  He presents to the Emergency Department complaining of generalized weakness and black stool.  Symptoms began yesterday.  He noticed that his stool was loose and black in color yesterday.  He took some milk of magnesia yesterday and reports having approximately 5 loose stools intermittently yesterday.  None today.  He also endorses generalized weakness and feels "sweaty" with excessive standing or walking.  He reports history of stomach ulcer with GI bleeding that was secondary to taking Aleve, but he states he does not believe he required a blood transfusion at that time.  He does admit to taking an anti-inflammatory medication for his chronic knee pain, but states he has been taking this for some time and believes the medication is Mobic.  He denies vomiting, chest pain, shortness of breath, abdominal pain or bloating, and syncope.  He denies taking any over-the-counter pain relievers except Tylenol.  Past Medical History:  Diagnosis Date  . Asbestos exposure   . Blood transfusion without reported diagnosis    after ruptured appendix 1963  . Diverticulosis   . GERD (gastroesophageal reflux disease)   . History of stomach ulcers 1995   H.pylori s/p treatment  . History of stomach ulcers   . Hyperlipidemia   . Muscle strain of right gluteal region 01/24/2014  . Prostate cancer [facility]) 2002   prostatectomy  . Traumatic hematoma of buttock 01/24/2014    Patient Active Problem List   Diagnosis Date Noted  . History of GI bleed   . Hyperglycemia 09/25/2016  . Venous stasis 05/17/2016  . Hyperlipidemia 07/11/2015  . Thrombocytopenia  ([place]) 01/24/2014    Past Surgical History:  Procedure Laterality Date  . APPENDECTOMY  1963   ruptured appendix/gangrene  . COLONOSCOPY  2013   Dr. Fuller Plan: multiple colon polyps (tubular adenomas), diverticulosis. no further surveillance colonoscopies due to age.  . ESOPHAGOGASTRODUODENOSCOPY  [id number]   Dr. Fuller Plan: gastric ulcer (h.pylori + s/p tx), follow up EGD verified ulcer healing  . ESOPHAGOGASTRODUODENOSCOPY N/A 09/26/2016   Procedure: ESOPHAGOGASTRODUODENOSCOPY (EGD);  Surgeon: Daneil Dolin, MD;  [facility];  Service: Endoscopy;  Laterality: N/A;  . ESOPHAGOGASTRODUODENOSCOPY N/A 01/23/2017   Procedure: ESOPHAGOGASTRODUODENOSCOPY (EGD);  Surgeon: Daneil Dolin, MD;  Location: [facility];  Service: Endoscopy;  Laterality: N/A;  12:15pm  . [place]  . LAPAROSCOPIC CHOLECYSTECTOMY  2003  . PROSTATECTOMY  2002        Home Medications    Prior to Admission medications   Medication Sig Start Date End Date Taking? Authorizing Provider  diclofenac sodium (VOLTAREN) 1 % GEL Apply 2 g topically 4 (four) times daily. 05/21/18   Dettinger, Fransisca Kaufmann, MD  meloxicam (MOBIC) 15 MG tablet TAKE 1 TABLET BY MOUTH  DAILY 02/18/19   Dettinger, Fransisca Kaufmann, MD  Multiple Vitamin (MULTIVITAMIN) tablet Take 1 tablet by mouth daily.    [provider]  Omega-3 1000 MG CAPS Take 2,000 mg by mouth daily.    [provider]  Probiotic Product (PROBIOTIC DAILY PO) Take by mouth.    [provider]    Family History Family History  Problem Relation Age of Onset  . Prostate cancer Brother   . Prostate cancer Brother   . Suicidality Brother   . Prostate cancer Brother   . Stomach cancer Neg Hx   . Colon cancer Neg Hx     Social History Social History   Tobacco Use  . Smoking status: Never Smoker  . Smokeless tobacco: Never Used  Substance Use Topics  . Alcohol use: Never    Frequency: Never  . Drug use: Never     Allergies    Patient has no known allergies.   Review of Systems Review of Systems  Constitutional: Negative for activity change, appetite change, chills and fever.  Respiratory: Negative for shortness of breath.   Cardiovascular: Negative for chest pain.  Gastrointestinal: Positive for blood in stool and diarrhea. Negative for abdominal distention, abdominal pain, anal bleeding, rectal pain and vomiting.  Genitourinary: Negative for decreased urine volume, difficulty urinating, flank pain and hematuria.  Musculoskeletal: Negative for neck pain.  Skin: Negative for rash.  Neurological: Positive for weakness. Negative for dizziness, syncope, speech difficulty, numbness and headaches.  Psychiatric/Behavioral: Negative for confusion.     Physical Exam Updated Vital Signs BP 124/78   Pulse (!) 109   Temp 98 F (36.7 C) (Oral)   Resp 18   Ht 6' (1.829 m)   Wt 104.3 kg   SpO2 99%   BMI 31.19 kg/m   Physical Exam Vitals signs and nursing note reviewed. Exam conducted with a chaperone present.  Constitutional:      Appearance: Normal appearance. He is not ill-appearing or toxic-appearing.  HENT:     Head: Atraumatic.     Mouth/Throat:     Mouth: Mucous membranes are moist.  Eyes:     Conjunctiva/sclera: Conjunctivae normal.  Neck:     Musculoskeletal: Normal range of motion and neck supple.  Cardiovascular:     Rate and Rhythm: Normal rate and regular rhythm.     Pulses: Normal pulses.  Pulmonary:     Effort: Pulmonary effort is normal.     Breath sounds: Normal breath sounds.  Chest:     Chest wall: No tenderness.  Abdominal:     General: There is no distension.     Palpations: Abdomen is soft. There is no mass.     Tenderness: There is no abdominal tenderness. There is no guarding.  Genitourinary:    Rectum: Guaiac result positive. No mass or tenderness. Normal anal tone.     Comments: Melanotic stool, heme positive.  No palpable rectal masses. Musculoskeletal: Normal range of  motion.        General: No tenderness or signs of injury.     Comments: 1+ pitting edema of the bilateral lower extremities.  Skin:    General: Skin is warm.     Capillary Refill: Capillary refill takes less than 2 seconds.  Neurological:     General: No focal deficit present.     Mental Status: He is alert.     Sensory: Sensation is intact. No sensory deficit.     Motor: Motor function is intact. No weakness or abnormal muscle tone.     Coordination: Coordination is intact.     Comments: CN II-XII grossly intact.  Speech clear      ED Treatments / Results  Labs (all labs ordered are listed, but only abnormal results are displayed) Labs Reviewed  COMPREHENSIVE METABOLIC PANEL - Abnormal; Notable for the  following components:      Result Value   Glucose, Bld 171 (*)    BUN 64 (*)    Calcium 8.2 (*)    All other components within normal limits  CBC WITH DIFFERENTIAL/PLATELET - Abnormal; Notable for the following components:   RBC 3.91 (*)    Hemoglobin 12.7 (*)    MCV 101.5 (*)    All other components within normal limits  POC OCCULT BLOOD, ED - Abnormal; Notable for the following components:   Fecal Occult Bld POSITIVE (*)    All other components within normal limits  PROTIME-INR  TYPE AND SCREEN    EKG EKG Interpretation  Date/Time:  Tuesday July 06 2019 08:36:41 EST Ventricular Rate:  107 PR Interval:    QRS Duration: 97 QT Interval:  384 QTC Calculation: 513 R Axis:   62 Text Interpretation: Sinus tachycardia Abnormal R-wave progression, early transition Prolonged QT interval rate is faster compared to may 2015 Confirmed by Sherwood Gambler [id number]) on 07/06/2019 10:59:52 AM   Radiology Dg Chest Portable 1 View  Result Date: 07/06/2019 CLINICAL DATA:  Weakness. EXAM: PORTABLE CHEST 1 VIEW COMPARISON:  Report of CT scan dated 06/01/2012 FINDINGS: The heart size and pulmonary vascularity are normal. Aortic atherosclerosis. Slight elevation of the right  hemidiaphragm. No infiltrates or effusions. No acute bone abnormality. Old healed fracture of the posterior aspect of the left fourth rib. IMPRESSION: 1. No acute abnormalities. 2. Aortic atherosclerosis. 3. Slight elevation of the right hemidiaphragm. Electronically Signed   By: Lorriane Shire M.D.   On: 07/06/2019 09:29    Procedures Procedures (including critical care time)  Medications Ordered in ED Medications  pantoprazole (PROTONIX) injection 40 mg (has no administration in time range)  sodium chloride 0.9 % bolus 1,000 mL (1,000 mLs Intravenous New Bag/Given 07/06/19 1031)     Initial Impression / Assessment and Plan / ED Course  I have reviewed the triage vital signs and the nursing notes.  Pertinent labs & imaging results that were available during my care of the patient were reviewed by me and considered in my medical decision making (see chart for details).     Orthostatic VS for the past 24 hrs (Last 3 readings):  BP- Lying Pulse- Lying BP- Sitting Pulse- Sitting BP- Standing at 0 minutes Pulse- Standing at 0 minutes  07/06/19 0958 (!) 129/94 105 110/78 112 96/78 120      Patient with melanotic stool, elevated BUN and orthostatic hypotension.  No vomiting or hematochezia.  history of previous GI bleed.  Patient will likely need admission and GI follow-up.  Discussed with Dr. Regenia Skeeter who also saw the patient.  [place] hospitalist, Dr. Manuella Ghazi, who agrees to admit.    Final Clinical Impressions(s) / ED Diagnoses   Final diagnoses:  Gastrointestinal hemorrhage with melena    ED Discharge Orders    None       Kem Parkinson, PA-C 07/06/19 1155    Sherwood Gambler, MD 07/06/19 1206

## 2019-07-07 ENCOUNTER — Telehealth: Payer: Self-pay | Admitting: Gastroenterology

## 2019-07-07 ENCOUNTER — Encounter (HOSPITAL_COMMUNITY): Payer: Self-pay | Admitting: Internal Medicine

## 2019-07-07 DIAGNOSIS — K921 Melena: Secondary | ICD-10-CM | POA: Diagnosis not present

## 2019-07-07 DIAGNOSIS — K922 Gastrointestinal hemorrhage, unspecified: Secondary | ICD-10-CM | POA: Diagnosis not present

## 2019-07-07 DIAGNOSIS — K254 Chronic or unspecified gastric ulcer with hemorrhage: Secondary | ICD-10-CM | POA: Diagnosis not present

## 2019-07-07 LAB — CBC
HCT: 31.5 % — ABNORMAL LOW (ref 39.0–52.0)
Hemoglobin: 9.9 g/dL — ABNORMAL LOW (ref 13.0–17.0)
MCH: 32 pg (ref 26.0–34.0)
MCHC: 31.4 g/dL (ref 30.0–36.0)
MCV: 101.9 fL — ABNORMAL HIGH (ref 80.0–100.0)
Platelets: 127 10*3/uL — ABNORMAL LOW (ref 150–400)
RBC: 3.09 MIL/uL — ABNORMAL LOW (ref 4.22–5.81)
RDW: 13.4 % (ref 11.5–15.5)
WBC: 6.7 10*3/uL (ref 4.0–10.5)
nRBC: 0 % (ref 0.0–0.2)

## 2019-07-07 LAB — BASIC METABOLIC PANEL
Anion gap: 6 (ref 5–15)
BUN: 34 mg/dL — ABNORMAL HIGH (ref 8–23)
CO2: 25 mmol/L (ref 22–32)
Calcium: 7.8 mg/dL — ABNORMAL LOW (ref 8.9–10.3)
Chloride: 110 mmol/L (ref 98–111)
Creatinine, Ser: 0.72 mg/dL (ref 0.61–1.24)
GFR calc Af Amer: >60 mL/min (ref >60)
GFR calc non Af Amer: >60 mL/min (ref >60)
Glucose, Bld: 98 mg/dL (ref 70–99)
Potassium: 4.2 mmol/L (ref 3.5–5.1)
Sodium: 141 mmol/L (ref 135–145)

## 2019-07-07 MED ORDER — CHLORHEXIDINE GLUCONATE CLOTH 2 % EX PADS: 6.0000 | MEDICATED_PAD | Freq: Every day | CUTANEOUS | Status: DC

## 2019-07-07 MED ORDER — ACETAMINOPHEN 325 MG PO TABS: 650.0000 mg | ORAL_TABLET | Freq: Four times a day (QID) | ORAL | Status: DC | PRN

## 2019-07-07 MED ORDER — PANTOPRAZOLE SODIUM 40 MG PO TBEC: 40.0000 mg | DELAYED_RELEASE_TABLET | Freq: Two times a day (BID) | ORAL | 0 refills | Status: DC

## 2019-07-07 NOTE — TOC Transition Note (Signed)
Transition of Care [facility]) - CM/SW Discharge Note   Patient Details  Name: Gregory Pearson MRN: [id number] Date of Birth: [birth year]  Transition of Care [facility]) CM/SW Contact:  Alazia Crocket, Chauncey [place], RN Phone Number: 07/07/2019, 11:55 AM   Clinical Narrative:   [place] home today. CM consulted for home health. Discussed with patient, he is agreeable to home health RN. No preference on providers. He has cane at home. Lives with wife, who has dementia. Patient independent. Son to pick patient up today.     Final next level of care: Gregory Barriers to Discharge: Barriers Resolved   Patient Goals and CMS Choice Patient states their goals for this hospitalization and ongoing recovery are:: return home with home health CMS Medicare.gov Compare Post Acute Care list provided to:: Patient Choice offered to / list presented to : Patient   Discharge Plan and Services   Discharge Planning Services: CM Consult Post Acute Care Choice: Home Health                    HH Arranged: RN [place]: [place] ([facility]) Date [place]: 07/07/19 Time [place]: [id number] Representative spoke with at [place]: [place] ([place]) Interventions     Readmission Risk Interventions No flowsheet data found.

## 2019-07-07 NOTE — Telephone Encounter (Signed)
Patient needs hospital follow up in 4-6 weeks.  

## 2019-07-07 NOTE — Progress Notes (Signed)
Patient received discharge instructions and verbalized understanding. Patient taken down via wheelchair to be picked up by son to be discharged to home.

## 2019-07-07 NOTE — Progress Notes (Signed)
Subjective: He feels well this morning. Has not had a BM since before coming to the hospital around 4 am on 07/06/19. Denies nausea, vomiting, abdominal pain, or brbpr. He is passing gas and is ready to get up out of the bed. Had soft breakfast this morning and tolerated it well.    Objective: Vital signs in last 24 hours: Temp:  [98 F (36.7 C)-98.9 F (37.2 C)] 98.2 F (36.8 C) (11/11 0750) Pulse Rate:  [78-182] 85 (11/11 0700) Resp:  [14-27] 17 (11/11 0700) BP: (85-170)/(57-159) 98/58 (11/11 0700) SpO2:  [90 %-100 %] 96 % (11/11 0700) Weight:  [104.3 kg-106.3 kg] 106.3 kg (11/11 0500) Last BM Date: 07/05/19 General:   Alert and oriented, pleasant Head:  Normocephalic and atraumatic. Eyes:  No icterus, sclera clear. Conjuctiva pink.  Abdomen:  Bowel sounds present, soft, non-tender, non-distended. No HSM or hernias noted. No rebound or guarding. No masses appreciated  Msk:  Symmetrical without gross deformities. Normal posture. Extremities:  Without edema. Neurologic:  Alert and  oriented x4;  grossly normal neurologically. Skin:  Warm and dry, intact without significant lesions.  Psych:  Normal mood and affect.  Intake/Output from previous day: 11/10 0701 - 11/11 0700 In: 2341.1 [P.O.:240; I.V.:1101.1; IV Piggyback:1000] Out: 825 [Urine:825] Intake/Output this shift: No intake/output data recorded.  Lab Results: Recent Labs    07/06/19 0900 07/06/19 1735 07/07/19 0421  WBC 8.8  --  6.7  HGB 12.7* 12.0* 9.9*  HCT 39.7 38.1* 31.5*  PLT 165  --  127*   BMET Recent Labs    07/06/19 0900 07/07/19 0421  NA 142 141  K 4.0 4.2  CL 107 110  CO2 25 25  GLUCOSE 171* 98  BUN 64* 34*  CREATININE 0.84 0.72  CALCIUM 8.2* 7.8*   LFT Recent Labs    07/06/19 0900  PROT 6.5  ALBUMIN 3.9  AST 18  ALT 17  ALKPHOS 52  BILITOT 1.0   PT/INR Recent Labs    07/06/19 0900  LABPROT 13.8  INR 1.1    Studies/Results: Dg Chest Portable 1 View  Result Date:  07/06/2019 CLINICAL DATA:  Weakness. EXAM: PORTABLE CHEST 1 VIEW COMPARISON:  Report of CT scan dated 06/01/2012 FINDINGS: The heart size and pulmonary vascularity are normal. Aortic atherosclerosis. Slight elevation of the right hemidiaphragm. No infiltrates or effusions. No acute bone abnormality. Old healed fracture of the posterior aspect of the left fourth rib. IMPRESSION: 1. No acute abnormalities. 2. Aortic atherosclerosis. 3. Slight elevation of the right hemidiaphragm. Electronically Signed   By: Lorriane Shire M.D.   On: 07/06/2019 09:29    Assessment: 85 y.o.year old malewith past medical history significant for prior GI bleed in the setting of gastric ulcer in 2018(likely NSAID related)with remote history of gastric ulcer in 1995,knee arthritis on Mobic, and diverticulosis who presented to the ED on 07/06/19 with generalized weakness and black tarry stools that began 07/05/19. Hemoglobin 12.7 (down from 14.6 in May 2019), stool occult blood positive, BUN elevated at 64. Denied abdominal pain, nausea, vomiting, GERD symptoms or BRBPR. Patient had discontinued Protonix x 6 months and has been taking Mobic daily. He was started on IV Protonix BID and underwent EGD on 07/06/2019 with normal esophagus, nonbleeding gastric ulcer with pigmented material, no therapeutic intervention needed, abnormal gastric mucosa of uncertain significance s/p biopsy, and normal duodenum.  Recommendations to continue Protonix 40 mg twice daily x3 months with repeat endoscopy in 3 months for surveillance.   Patient  had a few low blood pressures over night with systolic in the 123XX123. Hemoglobin down to 9.9 today. BUN improved to 34. No overt GI bleeding. Last BM was 4 am on 07/06/19. Suspect drop in hemoglobin is from equilibration from prior blood loss as well as dilutional. Will plan to continue to monitor today for any return of overt GI bleeding. He should hopefully go home later today or tomorrow morning as long as  he continues to do well without overt GI bleeding.   Plan:  Continue Protonix 40 mg BID. Transition to oral upon discharge.  Advance to regular diet.  Hopefully home later today or tomorrow morning.  Will need EGD in 3 months for surveillance.    LOS: 1 day    07/07/2019, 8:24 AM   Aliene Altes, PA-C [facility]

## 2019-07-07 NOTE — Discharge Instructions (Signed)
Gastrointestinal Bleeding °Gastrointestinal (GI) bleeding is bleeding somewhere along the path that food travels through the body (digestive tract). This path is anywhere between the mouth and the opening of the butt (anus). You may have blood in your poop (stool) or have black poop. If you throw up (vomit), there may be blood in it. °This condition can be mild, serious, or even life-threatening. If you have a lot of bleeding, you may need to stay in the hospital. °What are the causes? °This condition may be caused by: °· Irritation and swelling of the esophagus (esophagitis). The esophagus is part of the body that moves food from your mouth to your stomach. °· Swollen veins in the butt (hemorrhoids). °· Areas of painful tearing in the opening of the butt (anal fissures). These are often caused by passing hard poop. °· Pouches that form on the colon over time (diverticulosis). °· Irritation and swelling (diverticulitis) in areas where pouches have formed on the colon. °· Growths (polyps) or cancer. Colon cancer often starts out as growths that are not cancer. °· Irritation of the stomach lining (gastritis). °· Sores (ulcers) in the stomach. °What increases the risk? °You are more likely to develop this condition if you: °· Have a certain type of infection in your stomach (Helicobacter pylori infection). °· Take certain medicines. °· Smoke. °· Drink alcohol. °What are the signs or symptoms? °Common symptoms of this condition include: °· Throwing up (vomiting) material that has bright red blood in it. It may look like coffee grounds. °· Changes in your poop. The poop may: °? Have red blood in it. °? Be black, look like tar, and smell stronger than normal. °? Be red. °· Pain or cramping in the belly (abdomen). °How is this treated? °Treatment for this condition depends on the cause of the bleeding. For example: °· Sometimes, the bleeding can be stopped during a procedure that is done to find the problem (endoscopy or  colonoscopy). °· Medicines can be used to: °? Help control irritation, swelling, or infection. °? Reduce acid in your stomach. °· Certain problems can be treated with: °? Creams. °? Medicines that are put in the butt (suppositories). °? Warm baths. °· Surgery is sometimes needed. °· If you lose a lot of blood, you may need a blood transfusion. °If bleeding is mild, you may be allowed to go home. If there is a lot of bleeding, you will need to stay in the hospital. °Follow these instructions at home: ° °· Take over-the-counter and prescription medicines only as told by your doctor. °· Eat foods that have a lot of fiber in them. These foods include beans, whole grains, and fresh fruits and vegetables. You can also try eating 1-3 prunes each day. °· Drink enough fluid to keep your pee (urine) pale yellow. °· Keep all follow-up visits as told by your doctor. This is important. °Contact a doctor if: °· Your symptoms do not get better. °Get help right away if: °· Your bleeding does not stop. °· You feel dizzy or you pass out (faint). °· You feel weak. °· You have very bad cramps in your back or belly. °· You pass large clumps of blood (clots) in your poop. °· Your symptoms are getting worse. °· You have chest pain or fast heartbeats. °Summary °· GI bleeding is bleeding somewhere along the path that food travels through the body (digestive tract). °· This bleeding can be caused by many things. Treatment depends on the cause of the bleeding. °·   Take medicines only as told by your doctor. °· Keep all follow-up visits as told by your doctor. This is important. °This information is not intended to replace advice given to you by your health care provider. Make sure you discuss any questions you have with your health care provider. °Document Released: 05/21/2008 Document Revised: 03/25/2018 Document Reviewed: 03/25/2018 °Elsevier Patient Education © 2020 [employer] Inc. ° °

## 2019-07-07 NOTE — Discharge Summary (Signed)
Physician Discharge Summary  Gregory Pearson [id number] DOB: [birth year] DOA: 07/06/2019  PCP: Dettinger, Fransisca Kaufmann, MD GI: [facility]  Admit date: 07/06/2019 Discharge date: 07/07/2019  Admitted From:  Home  Disposition: Home   Recommendations for Outpatient Follow-up:  1. Follow up with PCP in 1 weeks 2. Follow up with GI in 2 months, Repeat EGD in 3 months 3. Protonix 40 mg BID for 3 months 4. Please obtain BMP/CBC in 1-2 weeks 5. Please follow up on the following pending results:  Biopsy results from EGD  Discharge Condition: STABLE   CODE STATUS: FULL    Brief Hospitalization Summary: Please see all hospital notes, images, labs for full details of the hospitalization. Dr. Trena Platt HPI: Gregory Pearson is a 85 y.o. male with medical history significant for prior gastric ulcer with EGD in 2018, knee osteoarthritis on Mobic, and diverticulosis who presented to the ED with complaints of generalized weakness and black, tarry stools that began yesterday.  He was noted to have loose stools intermittently yesterday and states he had about 5 of them.  He took some milk of magnesia after seeing this.  He denies any abdominal pain, nausea, or vomiting and states that he just generally feels weak and has some diaphoresis and is on the verge of "blacking out" when he stands.  He continues to take his Mobic on a regular basis for his knee pain.  He denies any chest pain, fevers, chills, shortness of breath or syncopal episodes.  He also takes Tylenol occasionally to help assist with management of his knee pain.   ED Course: Stable vital signs noted with some soft blood pressure readings and mild tachycardia.  He is currently asymptomatic.  Stool occult is noted to be positive.  Hemoglobin is 12.7.  Chest x-ray with no acute findings noted.  He has been given 1 L saline bolus as well as IV PPI.  Melena with suspicion of upper GI bleed -Patient has had prior gastric ulcer and does use NSAIDs  regularly -Maintain on PPI IV twice daily -N.p.o. except sips and medications for now -Consultation to GI appreciated -SCDs -Repeat hemoglobin and hematocrit at 1800 and follow-up CBC a.m. -Transfuse for hemoglobin less than 7 -IV fluid  Mild anemia likely secondary to GI loss above -We will check anemia panel -Transfuse as needed and monitor closely  Knee osteoarthritis -Avoid NSAIDs   DVT prophylaxis: SCDs Code Status: Full Family Communication: None at bedside Disposition Plan:GI evaluation Consults called:GI Admission status: Inpatient, Tele   Discharge Diagnoses:  Active Problems:   Melena   GI bleed   Discharge Instructions:  Allergies as of 07/07/2019   No Known Allergies     Medication List    STOP taking these medications   diclofenac sodium 1 % Gel Commonly known as: Voltaren   meloxicam 15 MG tablet Commonly known as: MOBIC     TAKE these medications   acetaminophen 325 MG tablet Commonly known as: TYLENOL Take 2 tablets (650 mg total) by mouth every 6 (six) hours as needed for mild pain (or Fever >/= 101).   multivitamin tablet Take 1 tablet by mouth daily.   Omega-3 1000 MG Caps Take 2,000 mg by mouth daily.   pantoprazole 40 MG tablet Commonly known as: Protonix Take 1 tablet (40 mg total) by mouth 2 (two) times daily.   PROBIOTIC DAILY PO Take by mouth.      Follow-up Information    [facility] Follow up.   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 Schedule an appointment as soon as possible for a visit in 2 month(s).   Contact information: [address] [place] [id number]       Dettinger, Fransisca Kaufmann, MD. Schedule an appointment as soon as possible for a visit in 1 week(s).   Specialties: Family Medicine, Cardiology Contact information: [place]  [phone]          No Known Allergies Allergies as of 07/07/2019   No Known Allergies     Medication List    STOP taking these medications   diclofenac sodium 1 % Gel Commonly known as: Voltaren   meloxicam 15 MG tablet Commonly known as: MOBIC     TAKE these medications   acetaminophen 325 MG tablet Commonly known as: TYLENOL Take 2 tablets (650 mg total) by mouth every 6 (six) hours as needed for mild pain (or Fever >/= 101).   multivitamin tablet Take 1 tablet by mouth daily.   Omega-3 1000 MG Caps Take 2,000 mg by mouth daily.   pantoprazole 40 MG tablet Commonly known as: Protonix Take 1 tablet (40 mg total) by mouth 2 (two) times daily.   PROBIOTIC DAILY PO Take by mouth.       Procedures/Studies: EGD: 07/06/19 Impression:       - Normal esophagus.                           - Non-bleeding gastric ulcer with pigmented                            material. Therapeutic intervention not needed.                           -Abnormal gastric mucosa of uncertain                            significance?status post biopsy.                           - Normal duodenal bulb and second portion of the                            duodenum. Moderate Sedation:      Moderate (conscious) sedation was administered by the endoscopy nurse       and supervised by the endoscopist. The following parameters were       monitored: oxygen saturation, heart rate, blood pressure, respiratory       rate, EKG, adequacy of pulmonary ventilation, and response to care.       Total physician intraservice time was 25 minutes. Recommendation:      - Return patient to ward for observation.                           - Clear liquid diet. Monitor overnight. Protonix 40                            mg twice daily x3 months; if doing okay in the  morning, can advance diet and discharge tomorrow                            from a GI standpoint                            - Continue present medications.                           - Await pathology results.                           - Repeat upper endoscopy in 3 months for                            surveillance. Please, nonsteroidal agents are                            absolutely contraindicated in this patient moving    Dg Chest Portable 1 View  Result Date: 07/06/2019 CLINICAL DATA:  Weakness. EXAM: PORTABLE CHEST 1 VIEW COMPARISON:  Report of CT scan dated 06/01/2012 FINDINGS: The heart size and pulmonary vascularity are normal. Aortic atherosclerosis. Slight elevation of the right hemidiaphragm. No infiltrates or effusions. No acute bone abnormality. Old healed fracture of the posterior aspect of the left fourth rib. IMPRESSION: 1. No acute abnormalities. 2. Aortic atherosclerosis. 3. Slight elevation of the right hemidiaphragm. Electronically Signed   By: Lorriane Shire M.D.   On: 07/06/2019 09:29      Subjective: Pt says he is feeling better and eating and drinking well.    Discharge Exam: Vitals:   07/07/19 1000 07/07/19 1144  BP: (!) 94/58   Pulse: 85   Resp: 17   Temp:  98.4 F (36.9 C)  SpO2: 96%    Vitals:   07/07/19 0800 07/07/19 0900 07/07/19 1000 07/07/19 1144  BP: (!) 85/62 104/69 (!) 94/58   Pulse: 85 90 85   Resp: 20 18 17    Temp:    98.4 F (36.9 C)  TempSrc:    Oral  SpO2: 96% 97% 96%   Weight:      Height:        General: Pt is alert, awake, not in acute distress Cardiovascular: RRR, S1/S2 +, no rubs, no gallops Respiratory: CTA bilaterally, no wheezing, no rhonchi Abdominal: Soft, NT, ND, bowel sounds + Extremities: no edema, no cyanosis   The results of significant diagnostics from this hospitalization (including imaging, microbiology, ancillary and laboratory) are listed below for reference.     Microbiology: Recent Results (from the past 240 hour(s))  SARS Coronavirus 2 by RT PCR (hospital order, performed in [facility] hospital lab) Nasopharyngeal  Nasopharyngeal Swab     Status: None   Collection Time: 07/06/19 12:08 PM   Specimen: Nasopharyngeal Swab  Result Value Ref Range Status   SARS Coronavirus 2 NEGATIVE NEGATIVE Final    Comment: (NOTE) If result is NEGATIVE SARS-CoV-2 target nucleic acids are NOT DETECTED. The SARS-CoV-2 RNA is generally detectable in upper and lower  respiratory specimens during the acute phase of infection. The lowest  concentration of SARS-CoV-2 viral copies this assay can detect is 250  copies / mL. A negative result does not preclude SARS-CoV-2 infection  and should not  be used as the sole basis for treatment or other  patient management decisions.  A negative result may occur with  improper specimen collection / handling, submission of specimen other  than nasopharyngeal swab, presence of viral mutation(s) within the  areas targeted by this assay, and inadequate number of viral copies  (<250 copies / mL). A negative result must be combined with clinical  observations, patient history, and epidemiological information. If result is POSITIVE SARS-CoV-2 target nucleic acids are DETECTED. The SARS-CoV-2 RNA is generally detectable in upper and lower  respiratory specimens dur ing the acute phase of infection.  Positive  results are indicative of active infection with SARS-CoV-2.  Clinical  correlation with patient history and other diagnostic information is  necessary to determine patient infection status.  Positive results do  not rule out bacterial infection or co-infection with other viruses. If result is PRESUMPTIVE POSTIVE SARS-CoV-2 nucleic acids MAY BE PRESENT.   A presumptive positive result was obtained on the submitted specimen  and confirmed on repeat testing.  While 2019 novel coronavirus  (SARS-CoV-2) nucleic acids may be present in the submitted sample  additional confirmatory testing may be necessary for epidemiological  and / or clinical management purposes  to differentiate between   SARS-CoV-2 and other Sarbecovirus currently known to infect humans.  If clinically indicated additional testing with an alternate test  methodology [phone]) is advised. The SARS-CoV-2 RNA is generally  detectable in upper and lower respiratory sp ecimens during the acute  phase of infection. The expected result is Negative. Fact Sheet for Patients:  StrictlyIdeas.no Fact Sheet for Healthcare Providers: BankingDealers.co.za This test is not yet approved or cleared by the [place] FDA and has been authorized for detection and/or diagnosis of SARS-CoV-2 by FDA under an Emergency Use Authorization (EUA).  This EUA will remain in effect (meaning this test can be used) for the duration of the COVID-19 declaration under Section 564(b)(1) of the Act, 21 U.S.C. section 360bbb-3(b)(1), unless the authorization is terminated or revoked sooner. Performed at [facility], [address], [place], [place] [address]   MRSA PCR Screening     Status: None   Collection Time: 07/06/19  2:43 PM   Specimen: Nasal Mucosa; Nasopharyngeal  Result Value Ref Range Status   MRSA by PCR NEGATIVE NEGATIVE Final    Comment:        The GeneXpert MRSA Assay (FDA approved for NASAL specimens only), is one component of a comprehensive MRSA colonization surveillance program. It is not intended to diagnose MRSA infection nor to guide or monitor treatment for MRSA infections. Performed at [facility], [address] [place],  [address]      Labs: BNP (last 3 results) No results for input(s): BNP in the last 8760 hours. Basic Metabolic Panel: Recent Labs  Lab 07/06/19 0900 07/07/19 0421  NA 142 141  K 4.0 4.2  CL 107 110  CO2 25 25  GLUCOSE 171* 98  BUN 64* 34*  CREATININE 0.84 0.72  CALCIUM 8.2* 7.8*   Liver Function Tests: Recent Labs  Lab 07/06/19 0900  AST 18  ALT 17  ALKPHOS 52  BILITOT 1.0  PROT 6.5  ALBUMIN 3.9   No  results for input(s): LIPASE, AMYLASE in the last 168 hours. No results for input(s): AMMONIA in the last 168 hours. CBC: Recent Labs  Lab 07/06/19 0900 07/06/19 1735 07/07/19 0421  WBC 8.8  --  6.7  NEUTROABS 6.9  --   --   HGB  12.7* 12.0* 9.9*  HCT 39.7 38.1* 31.5*  MCV 101.5*  --  101.9*  PLT 165  --  127*   Cardiac Enzymes: No results for input(s): CKTOTAL, CKMB, CKMBINDEX, TROPONINI in the last 168 hours. BNP: Invalid input(s): POCBNP CBG: No results for input(s): GLUCAP in the last 168 hours. D-Dimer No results for input(s): DDIMER in the last 72 hours. Hgb A1c No results for input(s): HGBA1C in the last 72 hours. Lipid Profile No results for input(s): CHOL, HDL, LDLCALC, TRIG, CHOLHDL, LDLDIRECT in the last 72 hours. Thyroid function studies No results for input(s): TSH, T4TOTAL, T3FREE, THYROIDAB in the last 72 hours.  Invalid input(s): FREET3 Anemia work up No results for input(s): VITAMINB12, FOLATE, FERRITIN, TIBC, IRON, RETICCTPCT in the last 72 hours. Urinalysis No results found for: COLORURINE, APPEARANCEUR, [place], [place], [place], [place], [place], [place], PROTEINUR, UROBILINOGEN, NITRITE, LEUKOCYTESUR Sepsis Labs Invalid input(s): PROCALCITONIN,  WBC,  LACTICIDVEN Microbiology Recent Results (from the past 240 hour(s))  SARS Coronavirus 2 by RT PCR (hospital order, performed in [facility] hospital lab) Nasopharyngeal Nasopharyngeal Swab     Status: None   Collection Time: 07/06/19 12:08 PM   Specimen: Nasopharyngeal Swab  Result Value Ref Range Status   SARS Coronavirus 2 NEGATIVE NEGATIVE Final    Comment: (NOTE) If result is NEGATIVE SARS-CoV-2 target nucleic acids are NOT DETECTED. The SARS-CoV-2 RNA is generally detectable in upper and lower  respiratory specimens during the acute phase of infection. The lowest  concentration of SARS-CoV-2 viral copies this assay can detect is 250  copies / mL. A negative result does not preclude  SARS-CoV-2 infection  and should not be used as the sole basis for treatment or other  patient management decisions.  A negative result may occur with  improper specimen collection / handling, submission of specimen other  than nasopharyngeal swab, presence of viral mutation(s) within the  areas targeted by this assay, and inadequate number of viral copies  (<250 copies / mL). A negative result must be combined with clinical  observations, patient history, and epidemiological information. If result is POSITIVE SARS-CoV-2 target nucleic acids are DETECTED. The SARS-CoV-2 RNA is generally detectable in upper and lower  respiratory specimens dur ing the acute phase of infection.  Positive  results are indicative of active infection with SARS-CoV-2.  Clinical  correlation with patient history and other diagnostic information is  necessary to determine patient infection status.  Positive results do  not rule out bacterial infection or co-infection with other viruses. If result is PRESUMPTIVE POSTIVE SARS-CoV-2 nucleic acids MAY BE PRESENT.   A presumptive positive result was obtained on the submitted specimen  and confirmed on repeat testing.  While 2019 novel coronavirus  (SARS-CoV-2) nucleic acids may be present in the submitted sample  additional confirmatory testing may be necessary for epidemiological  and / or clinical management purposes  to differentiate between  SARS-CoV-2 and other Sarbecovirus currently known to infect humans.  If clinically indicated additional testing with an alternate test  methodology [phone]) is advised. The SARS-CoV-2 RNA is generally  detectable in upper and lower respiratory sp ecimens during the acute  phase of infection. The expected result is Negative. Fact Sheet for Patients:  StrictlyIdeas.no Fact Sheet for Healthcare Providers: BankingDealers.co.za This test is not yet approved or cleared by the  [place] FDA and has been authorized for detection and/or diagnosis of SARS-CoV-2 by FDA under an Emergency Use Authorization (EUA).  This EUA will remain in effect (meaning this test  can be used) for the duration of the COVID-19 declaration under Section 564(b)(1) of the Act, 21 U.S.C. section 360bbb-3(b)(1), unless the authorization is terminated or revoked sooner. Performed at [facility], [address], [place], [place] [address]   MRSA PCR Screening     Status: None   Collection Time: 07/06/19  2:43 PM   Specimen: Nasal Mucosa; Nasopharyngeal  Result Value Ref Range Status   MRSA by PCR NEGATIVE NEGATIVE Final    Comment:        The GeneXpert MRSA Assay (FDA approved for NASAL specimens only), is one component of a comprehensive MRSA colonization surveillance program. It is not intended to diagnose MRSA infection nor to guide or monitor treatment for MRSA infections. Performed at [facility], [address] [place], [place] [address]     Time coordinating discharge: 31 minutes   SIGNED:  Irwin Brakeman, MD  [facility] 07/07/2019, 12:07 PM How to contact the [facility] Attending or Consulting provider [place] or covering provider during after hours [place], for this patient?  1. Check the care team in [facility] and look for a) attending/consulting [facility] provider listed and b) the [facility] team listed 2. Log into [email or web address] and use [employer]'s universal password to access. If you do not have the password, please contact the hospital operator. 3. Locate the [facility] provider you are looking for under [facility] and page to a number that you can be directly reached. 4. If you still have difficulty reaching the provider, please page the [facility] (Director on Call) for the Hospitalists listed on amion for assistance.

## 2019-07-08 ENCOUNTER — Encounter: Payer: Self-pay | Admitting: Gastroenterology

## 2019-07-08 ENCOUNTER — Telehealth: Payer: Self-pay | Admitting: Family Medicine

## 2019-07-08 DIAGNOSIS — K5730 Diverticulosis of large intestine without perforation or abscess without bleeding: Secondary | ICD-10-CM | POA: Diagnosis not present

## 2019-07-08 DIAGNOSIS — E785 Hyperlipidemia, unspecified: Secondary | ICD-10-CM | POA: Diagnosis not present

## 2019-07-08 DIAGNOSIS — S300XXD Contusion of lower back and pelvis, subsequent encounter: Secondary | ICD-10-CM | POA: Diagnosis not present

## 2019-07-08 DIAGNOSIS — K921 Melena: Secondary | ICD-10-CM | POA: Diagnosis not present

## 2019-07-08 DIAGNOSIS — D649 Anemia, unspecified: Secondary | ICD-10-CM | POA: Diagnosis not present

## 2019-07-08 DIAGNOSIS — K922 Gastrointestinal hemorrhage, unspecified: Secondary | ICD-10-CM | POA: Diagnosis not present

## 2019-07-08 DIAGNOSIS — Z77090 Contact with and (suspected) exposure to asbestos: Secondary | ICD-10-CM | POA: Diagnosis not present

## 2019-07-08 DIAGNOSIS — K3189 Other diseases of stomach and duodenum: Secondary | ICD-10-CM | POA: Diagnosis not present

## 2019-07-08 DIAGNOSIS — K219 Gastro-esophageal reflux disease without esophagitis: Secondary | ICD-10-CM | POA: Diagnosis not present

## 2019-07-08 DIAGNOSIS — C61 Malignant neoplasm of prostate: Secondary | ICD-10-CM | POA: Diagnosis not present

## 2019-07-08 DIAGNOSIS — Z9079 Acquired absence of other genital organ(s): Secondary | ICD-10-CM | POA: Diagnosis not present

## 2019-07-08 DIAGNOSIS — S76011D Strain of muscle, fascia and tendon of right hip, subsequent encounter: Secondary | ICD-10-CM | POA: Diagnosis not present

## 2019-07-08 DIAGNOSIS — K259 Gastric ulcer, unspecified as acute or chronic, without hemorrhage or perforation: Secondary | ICD-10-CM | POA: Diagnosis not present

## 2019-07-08 DIAGNOSIS — M179 Osteoarthritis of knee, unspecified: Secondary | ICD-10-CM | POA: Diagnosis not present

## 2019-07-08 DIAGNOSIS — Z8619 Personal history of other infectious and parasitic diseases: Secondary | ICD-10-CM | POA: Diagnosis not present

## 2019-07-08 LAB — SURGICAL PATHOLOGY

## 2019-07-08 NOTE — Telephone Encounter (Signed)
Patient scheduled and letter sent  °

## 2019-07-08 NOTE — Telephone Encounter (Signed)
Apt scheduled.  

## 2019-07-09 ENCOUNTER — Encounter: Payer: Self-pay | Admitting: Internal Medicine

## 2019-07-13 DIAGNOSIS — D649 Anemia, unspecified: Secondary | ICD-10-CM | POA: Diagnosis not present

## 2019-07-13 DIAGNOSIS — K921 Melena: Secondary | ICD-10-CM | POA: Diagnosis not present

## 2019-07-13 DIAGNOSIS — M179 Osteoarthritis of knee, unspecified: Secondary | ICD-10-CM | POA: Diagnosis not present

## 2019-07-13 DIAGNOSIS — K922 Gastrointestinal hemorrhage, unspecified: Secondary | ICD-10-CM | POA: Diagnosis not present

## 2019-07-13 DIAGNOSIS — K259 Gastric ulcer, unspecified as acute or chronic, without hemorrhage or perforation: Secondary | ICD-10-CM | POA: Diagnosis not present

## 2019-07-13 DIAGNOSIS — K3189 Other diseases of stomach and duodenum: Secondary | ICD-10-CM | POA: Diagnosis not present

## 2019-07-15 ENCOUNTER — Ambulatory Visit: Payer: Medicare Other | Admitting: Family Medicine

## 2019-07-16 ENCOUNTER — Encounter: Payer: Self-pay | Admitting: Family Medicine

## 2019-07-16 DIAGNOSIS — M179 Osteoarthritis of knee, unspecified: Secondary | ICD-10-CM | POA: Diagnosis not present

## 2019-07-16 DIAGNOSIS — K921 Melena: Secondary | ICD-10-CM | POA: Diagnosis not present

## 2019-07-16 DIAGNOSIS — D649 Anemia, unspecified: Secondary | ICD-10-CM | POA: Diagnosis not present

## 2019-07-16 DIAGNOSIS — K922 Gastrointestinal hemorrhage, unspecified: Secondary | ICD-10-CM | POA: Diagnosis not present

## 2019-07-16 DIAGNOSIS — K259 Gastric ulcer, unspecified as acute or chronic, without hemorrhage or perforation: Secondary | ICD-10-CM | POA: Diagnosis not present

## 2019-07-16 DIAGNOSIS — C9220 Atypical chronic myeloid leukemia, BCR/ABL-negative, not having achieved remission: Secondary | ICD-10-CM | POA: Diagnosis not present

## 2019-07-16 DIAGNOSIS — K3189 Other diseases of stomach and duodenum: Secondary | ICD-10-CM | POA: Diagnosis not present

## 2019-07-19 ENCOUNTER — Other Ambulatory Visit: Payer: Self-pay

## 2019-07-19 ENCOUNTER — Encounter: Payer: Self-pay | Admitting: Family Medicine

## 2019-07-19 ENCOUNTER — Telehealth: Payer: Self-pay | Admitting: Family Medicine

## 2019-07-19 ENCOUNTER — Ambulatory Visit (INDEPENDENT_AMBULATORY_CARE_PROVIDER_SITE_OTHER): Payer: Medicare Other | Admitting: Family Medicine

## 2019-07-19 DIAGNOSIS — M179 Osteoarthritis of knee, unspecified: Secondary | ICD-10-CM | POA: Diagnosis not present

## 2019-07-19 DIAGNOSIS — Z8719 Personal history of other diseases of the digestive system: Secondary | ICD-10-CM

## 2019-07-19 DIAGNOSIS — K921 Melena: Secondary | ICD-10-CM | POA: Diagnosis not present

## 2019-07-19 DIAGNOSIS — K259 Gastric ulcer, unspecified as acute or chronic, without hemorrhage or perforation: Secondary | ICD-10-CM | POA: Diagnosis not present

## 2019-07-19 DIAGNOSIS — K922 Gastrointestinal hemorrhage, unspecified: Secondary | ICD-10-CM

## 2019-07-19 DIAGNOSIS — D649 Anemia, unspecified: Secondary | ICD-10-CM | POA: Diagnosis not present

## 2019-07-19 DIAGNOSIS — K3189 Other diseases of stomach and duodenum: Secondary | ICD-10-CM | POA: Diagnosis not present

## 2019-07-19 NOTE — Progress Notes (Signed)
   Virtual Visit via telephone Note   I connected with Gregory Pearson on 07/19/19 at 1030 by telephone and verified that I am speaking with the correct person using two identifiers. Gregory Pearson is currently located at home and no other people are currently with her during visit. The provider, Fransisca Kaufmann Cecillia Menees, MD is located in their office at time of visit.  Call ended at 1040  I discussed the limitations, risks, security and privacy concerns of performing an evaluation and management service by telephone and the availability of in person appointments. I also discussed with the patient that there may be a patient responsible charge related to this service. The patient expressed understanding and agreed to proceed.   History and Present Illness: Patient was having a GI bleed due to nsaids and was recommended to only take tylenol. He was in hte ED 07/06/2019. He denies any bleeding since leaving the hospital.  He feels like things are fixed since stopping the nsaid.   He wants appt for knee injections to help him with therapy.   No diagnosis found.  Outpatient Encounter Medications as of 07/19/2019  Medication Sig  . acetaminophen (TYLENOL) 325 MG tablet Take 2 tablets (650 mg total) by mouth every 6 (six) hours as needed for mild pain (or Fever >/= 101).  . Multiple Vitamin (MULTIVITAMIN) tablet Take 1 tablet by mouth daily.  . Omega-3 1000 MG CAPS Take 2,000 mg by mouth daily.  . pantoprazole (PROTONIX) 40 MG tablet Take 1 tablet (40 mg total) by mouth 2 (two) times daily.  . Probiotic Product (PROBIOTIC DAILY PO) Take by mouth.   No facility-administered encounter medications on file as of 07/19/2019.     Review of Systems  Constitutional: Negative for chills and fever.  Respiratory: Negative for shortness of breath and wheezing.   Cardiovascular: Negative for chest pain and leg swelling.  Gastrointestinal: Negative for abdominal pain, blood in stool, constipation, diarrhea and  nausea.  Musculoskeletal: Negative for back pain and gait problem.  Skin: Negative for rash.  All other systems reviewed and are negative.   Observations/Objective: Patient sounds comfortable and in no acute distress  Assessment and Plan: Problem List Items Addressed This Visit      Digestive   GI bleed - Primary   Relevant Orders   CBC with Differential/Platelet   CMP14+EGFR     Other   History of GI bleed   Relevant Orders   CBC with Differential/Platelet   CMP14+EGFR       Follow Up Instructions:  Follow up in 3 months.    I discussed the assessment and treatment plan with the patient. The patient was provided an opportunity to ask questions and all were answered. The patient agreed with the plan and demonstrated an understanding of the instructions.   The patient was advised to call back or seek an in-person evaluation if the symptoms worsen or if the condition fails to improve as anticipated.  The above assessment and management plan was discussed with the patient. The patient verbalized understanding of and has agreed to the management plan. Patient is aware to call the clinic if symptoms persist or worsen. Patient is aware when to return to the clinic for a follow-up visit. Patient educated on when it is appropriate to go to the emergency department.    I provided 10 minutes of non-face-to-face time during this encounter.    Worthy Rancher, MD

## 2019-07-19 NOTE — Telephone Encounter (Signed)
Apt scheduled.  

## 2019-07-20 DIAGNOSIS — M179 Osteoarthritis of knee, unspecified: Secondary | ICD-10-CM | POA: Diagnosis not present

## 2019-07-20 DIAGNOSIS — K921 Melena: Secondary | ICD-10-CM | POA: Diagnosis not present

## 2019-07-20 DIAGNOSIS — D649 Anemia, unspecified: Secondary | ICD-10-CM | POA: Diagnosis not present

## 2019-07-20 DIAGNOSIS — K3189 Other diseases of stomach and duodenum: Secondary | ICD-10-CM | POA: Diagnosis not present

## 2019-07-20 DIAGNOSIS — K922 Gastrointestinal hemorrhage, unspecified: Secondary | ICD-10-CM | POA: Diagnosis not present

## 2019-07-20 DIAGNOSIS — K259 Gastric ulcer, unspecified as acute or chronic, without hemorrhage or perforation: Secondary | ICD-10-CM | POA: Diagnosis not present

## 2019-07-21 DIAGNOSIS — K259 Gastric ulcer, unspecified as acute or chronic, without hemorrhage or perforation: Secondary | ICD-10-CM | POA: Diagnosis not present

## 2019-07-21 DIAGNOSIS — K921 Melena: Secondary | ICD-10-CM | POA: Diagnosis not present

## 2019-07-21 DIAGNOSIS — M179 Osteoarthritis of knee, unspecified: Secondary | ICD-10-CM | POA: Diagnosis not present

## 2019-07-21 DIAGNOSIS — D649 Anemia, unspecified: Secondary | ICD-10-CM | POA: Diagnosis not present

## 2019-07-21 DIAGNOSIS — K3189 Other diseases of stomach and duodenum: Secondary | ICD-10-CM | POA: Diagnosis not present

## 2019-07-21 DIAGNOSIS — K922 Gastrointestinal hemorrhage, unspecified: Secondary | ICD-10-CM | POA: Diagnosis not present

## 2019-07-24 DIAGNOSIS — K3189 Other diseases of stomach and duodenum: Secondary | ICD-10-CM | POA: Diagnosis not present

## 2019-07-24 DIAGNOSIS — K921 Melena: Secondary | ICD-10-CM | POA: Diagnosis not present

## 2019-07-24 DIAGNOSIS — D649 Anemia, unspecified: Secondary | ICD-10-CM | POA: Diagnosis not present

## 2019-07-24 DIAGNOSIS — K259 Gastric ulcer, unspecified as acute or chronic, without hemorrhage or perforation: Secondary | ICD-10-CM | POA: Diagnosis not present

## 2019-07-24 DIAGNOSIS — M179 Osteoarthritis of knee, unspecified: Secondary | ICD-10-CM | POA: Diagnosis not present

## 2019-07-24 DIAGNOSIS — K922 Gastrointestinal hemorrhage, unspecified: Secondary | ICD-10-CM | POA: Diagnosis not present

## 2019-07-26 DIAGNOSIS — K921 Melena: Secondary | ICD-10-CM | POA: Diagnosis not present

## 2019-07-26 DIAGNOSIS — K3189 Other diseases of stomach and duodenum: Secondary | ICD-10-CM | POA: Diagnosis not present

## 2019-07-26 DIAGNOSIS — M179 Osteoarthritis of knee, unspecified: Secondary | ICD-10-CM | POA: Diagnosis not present

## 2019-07-26 DIAGNOSIS — K259 Gastric ulcer, unspecified as acute or chronic, without hemorrhage or perforation: Secondary | ICD-10-CM | POA: Diagnosis not present

## 2019-07-26 DIAGNOSIS — D649 Anemia, unspecified: Secondary | ICD-10-CM | POA: Diagnosis not present

## 2019-07-26 DIAGNOSIS — K922 Gastrointestinal hemorrhage, unspecified: Secondary | ICD-10-CM | POA: Diagnosis not present

## 2019-07-27 ENCOUNTER — Encounter (HOSPITAL_COMMUNITY): Payer: Self-pay | Admitting: Internal Medicine

## 2019-07-27 DIAGNOSIS — M179 Osteoarthritis of knee, unspecified: Secondary | ICD-10-CM | POA: Diagnosis not present

## 2019-07-27 DIAGNOSIS — K921 Melena: Secondary | ICD-10-CM | POA: Diagnosis not present

## 2019-07-27 DIAGNOSIS — D649 Anemia, unspecified: Secondary | ICD-10-CM | POA: Diagnosis not present

## 2019-07-27 DIAGNOSIS — K259 Gastric ulcer, unspecified as acute or chronic, without hemorrhage or perforation: Secondary | ICD-10-CM | POA: Diagnosis not present

## 2019-07-27 DIAGNOSIS — K3189 Other diseases of stomach and duodenum: Secondary | ICD-10-CM | POA: Diagnosis not present

## 2019-07-27 DIAGNOSIS — K922 Gastrointestinal hemorrhage, unspecified: Secondary | ICD-10-CM | POA: Diagnosis not present

## 2019-07-28 DIAGNOSIS — M179 Osteoarthritis of knee, unspecified: Secondary | ICD-10-CM | POA: Diagnosis not present

## 2019-07-28 DIAGNOSIS — K922 Gastrointestinal hemorrhage, unspecified: Secondary | ICD-10-CM | POA: Diagnosis not present

## 2019-07-28 DIAGNOSIS — K259 Gastric ulcer, unspecified as acute or chronic, without hemorrhage or perforation: Secondary | ICD-10-CM | POA: Diagnosis not present

## 2019-07-28 DIAGNOSIS — K3189 Other diseases of stomach and duodenum: Secondary | ICD-10-CM | POA: Diagnosis not present

## 2019-07-28 DIAGNOSIS — K921 Melena: Secondary | ICD-10-CM | POA: Diagnosis not present

## 2019-07-28 DIAGNOSIS — D649 Anemia, unspecified: Secondary | ICD-10-CM | POA: Diagnosis not present

## 2019-07-29 ENCOUNTER — Ambulatory Visit (INDEPENDENT_AMBULATORY_CARE_PROVIDER_SITE_OTHER): Payer: Medicare Other

## 2019-07-29 ENCOUNTER — Other Ambulatory Visit: Payer: Self-pay

## 2019-07-29 DIAGNOSIS — C61 Malignant neoplasm of prostate: Secondary | ICD-10-CM

## 2019-07-29 DIAGNOSIS — K921 Melena: Secondary | ICD-10-CM | POA: Diagnosis not present

## 2019-07-29 DIAGNOSIS — K5730 Diverticulosis of large intestine without perforation or abscess without bleeding: Secondary | ICD-10-CM

## 2019-07-29 DIAGNOSIS — M179 Osteoarthritis of knee, unspecified: Secondary | ICD-10-CM

## 2019-07-29 DIAGNOSIS — K922 Gastrointestinal hemorrhage, unspecified: Secondary | ICD-10-CM

## 2019-07-29 DIAGNOSIS — K259 Gastric ulcer, unspecified as acute or chronic, without hemorrhage or perforation: Secondary | ICD-10-CM

## 2019-07-29 DIAGNOSIS — Z77090 Contact with and (suspected) exposure to asbestos: Secondary | ICD-10-CM

## 2019-07-29 DIAGNOSIS — K3189 Other diseases of stomach and duodenum: Secondary | ICD-10-CM

## 2019-07-29 DIAGNOSIS — D649 Anemia, unspecified: Secondary | ICD-10-CM | POA: Diagnosis not present

## 2019-07-29 DIAGNOSIS — E785 Hyperlipidemia, unspecified: Secondary | ICD-10-CM

## 2019-07-29 DIAGNOSIS — Z8619 Personal history of other infectious and parasitic diseases: Secondary | ICD-10-CM

## 2019-07-29 DIAGNOSIS — K219 Gastro-esophageal reflux disease without esophagitis: Secondary | ICD-10-CM

## 2019-07-29 DIAGNOSIS — S300XXD Contusion of lower back and pelvis, subsequent encounter: Secondary | ICD-10-CM

## 2019-07-29 DIAGNOSIS — S76011D Strain of muscle, fascia and tendon of right hip, subsequent encounter: Secondary | ICD-10-CM

## 2019-07-29 DIAGNOSIS — Z9079 Acquired absence of other genital organ(s): Secondary | ICD-10-CM

## 2019-08-02 ENCOUNTER — Other Ambulatory Visit: Payer: Self-pay | Admitting: Family Medicine

## 2019-08-02 DIAGNOSIS — K921 Melena: Secondary | ICD-10-CM | POA: Diagnosis not present

## 2019-08-02 DIAGNOSIS — M1812 Unilateral primary osteoarthritis of first carpometacarpal joint, left hand: Secondary | ICD-10-CM

## 2019-08-02 DIAGNOSIS — M1811 Unilateral primary osteoarthritis of first carpometacarpal joint, right hand: Secondary | ICD-10-CM

## 2019-08-02 DIAGNOSIS — K259 Gastric ulcer, unspecified as acute or chronic, without hemorrhage or perforation: Secondary | ICD-10-CM | POA: Diagnosis not present

## 2019-08-02 DIAGNOSIS — M179 Osteoarthritis of knee, unspecified: Secondary | ICD-10-CM | POA: Diagnosis not present

## 2019-08-02 DIAGNOSIS — D649 Anemia, unspecified: Secondary | ICD-10-CM | POA: Diagnosis not present

## 2019-08-02 DIAGNOSIS — M1711 Unilateral primary osteoarthritis, right knee: Secondary | ICD-10-CM

## 2019-08-02 DIAGNOSIS — K3189 Other diseases of stomach and duodenum: Secondary | ICD-10-CM | POA: Diagnosis not present

## 2019-08-02 DIAGNOSIS — K922 Gastrointestinal hemorrhage, unspecified: Secondary | ICD-10-CM | POA: Diagnosis not present

## 2019-08-05 DIAGNOSIS — K922 Gastrointestinal hemorrhage, unspecified: Secondary | ICD-10-CM | POA: Diagnosis not present

## 2019-08-05 DIAGNOSIS — K921 Melena: Secondary | ICD-10-CM | POA: Diagnosis not present

## 2019-08-05 DIAGNOSIS — M179 Osteoarthritis of knee, unspecified: Secondary | ICD-10-CM | POA: Diagnosis not present

## 2019-08-05 DIAGNOSIS — D649 Anemia, unspecified: Secondary | ICD-10-CM | POA: Diagnosis not present

## 2019-08-05 DIAGNOSIS — K3189 Other diseases of stomach and duodenum: Secondary | ICD-10-CM | POA: Diagnosis not present

## 2019-08-05 DIAGNOSIS — K259 Gastric ulcer, unspecified as acute or chronic, without hemorrhage or perforation: Secondary | ICD-10-CM | POA: Diagnosis not present

## 2019-08-06 ENCOUNTER — Other Ambulatory Visit: Payer: Self-pay

## 2019-08-07 DIAGNOSIS — K921 Melena: Secondary | ICD-10-CM | POA: Diagnosis not present

## 2019-08-07 DIAGNOSIS — D649 Anemia, unspecified: Secondary | ICD-10-CM | POA: Diagnosis not present

## 2019-08-07 DIAGNOSIS — C61 Malignant neoplasm of prostate: Secondary | ICD-10-CM | POA: Diagnosis not present

## 2019-08-07 DIAGNOSIS — K259 Gastric ulcer, unspecified as acute or chronic, without hemorrhage or perforation: Secondary | ICD-10-CM | POA: Diagnosis not present

## 2019-08-07 DIAGNOSIS — K5730 Diverticulosis of large intestine without perforation or abscess without bleeding: Secondary | ICD-10-CM | POA: Diagnosis not present

## 2019-08-07 DIAGNOSIS — S300XXD Contusion of lower back and pelvis, subsequent encounter: Secondary | ICD-10-CM | POA: Diagnosis not present

## 2019-08-07 DIAGNOSIS — S76011D Strain of muscle, fascia and tendon of right hip, subsequent encounter: Secondary | ICD-10-CM | POA: Diagnosis not present

## 2019-08-07 DIAGNOSIS — K3189 Other diseases of stomach and duodenum: Secondary | ICD-10-CM | POA: Diagnosis not present

## 2019-08-07 DIAGNOSIS — K219 Gastro-esophageal reflux disease without esophagitis: Secondary | ICD-10-CM | POA: Diagnosis not present

## 2019-08-07 DIAGNOSIS — K922 Gastrointestinal hemorrhage, unspecified: Secondary | ICD-10-CM | POA: Diagnosis not present

## 2019-08-07 DIAGNOSIS — E785 Hyperlipidemia, unspecified: Secondary | ICD-10-CM | POA: Diagnosis not present

## 2019-08-07 DIAGNOSIS — Z77090 Contact with and (suspected) exposure to asbestos: Secondary | ICD-10-CM | POA: Diagnosis not present

## 2019-08-07 DIAGNOSIS — Z8619 Personal history of other infectious and parasitic diseases: Secondary | ICD-10-CM | POA: Diagnosis not present

## 2019-08-07 DIAGNOSIS — M179 Osteoarthritis of knee, unspecified: Secondary | ICD-10-CM | POA: Diagnosis not present

## 2019-08-07 DIAGNOSIS — Z9079 Acquired absence of other genital organ(s): Secondary | ICD-10-CM | POA: Diagnosis not present

## 2019-08-09 ENCOUNTER — Other Ambulatory Visit: Payer: Self-pay

## 2019-08-09 ENCOUNTER — Encounter: Payer: Self-pay | Admitting: Family Medicine

## 2019-08-09 ENCOUNTER — Ambulatory Visit (INDEPENDENT_AMBULATORY_CARE_PROVIDER_SITE_OTHER): Payer: Medicare Other | Admitting: Family Medicine

## 2019-08-09 VITALS — BP 134/70 | HR 75 | Temp 98.6°F | Ht 72.0 in | Wt 244.0 lb

## 2019-08-09 DIAGNOSIS — M25561 Pain in right knee: Secondary | ICD-10-CM

## 2019-08-09 DIAGNOSIS — G8929 Other chronic pain: Secondary | ICD-10-CM

## 2019-08-09 DIAGNOSIS — M25562 Pain in left knee: Secondary | ICD-10-CM | POA: Diagnosis not present

## 2019-08-09 MED ORDER — METHYLPREDNISOLONE ACETATE 80 MG/ML IJ SUSP
80.0000 mg | Freq: Once | INTRAMUSCULAR | Status: AC
  Administered 2019-08-09: 80 mg via INTRAMUSCULAR

## 2019-08-09 NOTE — Progress Notes (Signed)
BP 134/70   Pulse 75   Temp 98.6 F (37 C) (Temporal)   Ht 6' (1.829 m)   Wt 244 lb (110.7 kg)   SpO2 97%   BMI 33.09 kg/m    Subjective:   Patient ID: Gregory Pearson, male    DOB: Mar 06, 1928, 85 y.o.   MRN: [id number]  HPI: Gregory Pearson is a 85 y.o. male presenting on 08/09/2019 for knee injections (bilateral - patient states it has been over 4 months since he has had injections)   HPI Patient is coming in complaining of bilateral knee osteoporosis Patient is coming in for recheck of his knees, they have been hurting him again and he wants to see if he can get some injections again.  He says it has been over 4 months since he had injections in the looks like it has been about 5 months and he says the injections gave him at least 4 to 5 months of relief.  Hurts on the medial aspect of both knees.  Relevant past medical, surgical, family and social history reviewed and updated as indicated. Interim medical history since our last visit reviewed. Allergies and medications reviewed and updated.  Review of Systems  Constitutional: Negative for chills and fever.  Respiratory: Negative for shortness of breath and wheezing.   Cardiovascular: Negative for chest pain and leg swelling.  Musculoskeletal: Positive for arthralgias. Negative for back pain, gait problem and joint swelling.  Skin: Negative for rash.  All other systems reviewed and are negative.   Per HPI unless specifically indicated above   Allergies as of 08/09/2019   No Known Allergies     Medication List       Accurate as of August 09, 2019 11:59 PM. If you have any questions, ask your nurse or doctor.        acetaminophen 325 MG tablet Commonly known as: TYLENOL Take 2 tablets (650 mg total) by mouth every 6 (six) hours as needed for mild pain (or Fever >/= 101).   multivitamin tablet Take 1 tablet by mouth daily.   Omega-3 1000 MG Caps Take 2,000 mg by mouth daily.   pantoprazole 40 MG tablet Commonly  known as: Protonix Take 1 tablet (40 mg total) by mouth 2 (two) times daily.   PROBIOTIC DAILY PO Take by mouth.        Objective:   BP 134/70   Pulse 75   Temp 98.6 F (37 C) (Temporal)   Ht 6' (1.829 m)   Wt 244 lb (110.7 kg)   SpO2 97%   BMI 33.09 kg/m   Wt Readings from Last 3 Encounters:  08/11/19 241 lb 3.2 oz (109.4 kg)  08/09/19 244 lb (110.7 kg)  07/07/19 234 lb 5.6 oz (106.3 kg)    Physical Exam Vitals and nursing note reviewed.  Constitutional:      General: He is not in acute distress.    Appearance: He is well-developed. He is not diaphoretic.  Eyes:     General: No scleral icterus.    Conjunctiva/sclera: Conjunctivae normal.  Neck:     Thyroid: No thyromegaly.  Musculoskeletal:        General: Normal range of motion.     Right knee: Crepitus present. No erythema. Normal range of motion. Tenderness present over the medial joint line. No LCL laxity, MCL laxity, ACL laxity or PCL laxity. Normal alignment and normal meniscus.     Left knee: Crepitus present. No erythema. Normal range of motion. Tenderness present  over the medial joint line. No LCL laxity, MCL laxity, ACL laxity or PCL laxity.Normal alignment and normal meniscus.  Skin:    General: Skin is warm and dry.     Findings: No rash.  Neurological:     Mental Status: He is alert and oriented to person, place, and time.     Coordination: Coordination normal.     Knee injection bilateral: Consent form signed. Risk factors of bleeding and infection discussed with patient and patient is agreeable towards injection. Patient prepped with Betadine. Lateral approach towards injection used. Injected 80 mg of Depo-Medrol and 1 mL of 2% lidocaine. Patient tolerated procedure well and no side effects from noted. Minimal to no bleeding. Simple bandage applied after.   Assessment & Plan:   Problem List Items Addressed This Visit    None    Visit Diagnoses    Chronic pain of both knees    -  Primary    Relevant Medications   methylPREDNISolone acetate (DEPO-MEDROL) injection 80 mg (Completed)   methylPREDNISolone acetate (DEPO-MEDROL) injection 80 mg (Completed)       Follow up plan: Return if symptoms worsen or fail to improve.  Counseling provided for all of the vaccine components No orders of the defined types were placed in this encounter.   Caryl Pina, MD [facility] 08/15/2019, 9:43 PM

## 2019-08-10 NOTE — Progress Notes (Signed)
Referring Provider: Dettinger, Fransisca Kaufmann, MD Primary Care Physician:  Dettinger, Fransisca Kaufmann, MD Primary GI Physician: Dr. Gala Romney  Chief Complaint  Patient presents with  . Hospitalization Follow-up    doing ok, no bleeding    HPI:   Gregory Pearson is a 85 y.o. male with a history of significant for prior GI bleed in the setting of gastric ulcer in 2018 (likely NSAID related) with remote history of gastric ulcer in 1995, and knee arthritis for which he took Mobic.   He is presenting today for hospital follow-up.  Patient was admitted on 07/06/19 for melena, heme positive stool, and downtrending hemoglobin (12.7 on admission, down from 14.6 in May 2019).  He had continued on Mobic and discontinued Protonix about 6 months prior. EGD on 07/06/2019 with normal esophagus, nonbleeding gastric ulcer with pigmented material, no therapeutic intervention needed, abnormal gastric mucosa of uncertain significance s/p biopsy, and normal duodenum.    Pathology with mild reactive gastropathy, mild chronic gastritis, H. pylori negative.  Recommendations to continue Protonix 40 mg twice daily x3 months with repeat endoscopy in 3 months for surveillance. Avoid all NSAIDs. Hemoglobin on discharge 9.9.   Today he states he is doing well. No further bleeding. No melena. No blood in the stool. No abdominal pain. No nausea or vomiting. No reflux or heartburn symptoms. Taking Protonix twice a day. No longer taking Mobic. No other NSAIDs. Only using tylenol as needed. No dysphagia. BMs daily.  No constipation or diarrhea. Will use MiraLAX if he thinks he needs it.   No fever, chills, lightheadedness, dizziness, presyncope, or syncope. No chest pain, palpitations, shortness of breath or cough.   Past Medical History:  Diagnosis Date  . Asbestos exposure   . Blood transfusion without reported diagnosis    after ruptured appendix 1963  . Diverticulosis   . Gastric ulcer 06/2019  . GERD (gastroesophageal reflux disease)    . History of stomach ulcers 1995   H.pylori s/p treatment  . History of stomach ulcers   . Hyperlipidemia   . Muscle strain of right gluteal region 01/24/2014  . Prostate cancer [facility]) 2002   prostatectomy  . Traumatic hematoma of buttock 01/24/2014    Past Surgical History:  Procedure Laterality Date  . APPENDECTOMY  1963   ruptured appendix/gangrene  . BIOPSY  07/06/2019   Procedure: BIOPSY;  Surgeon: Daneil Dolin, MD;  Location: [facility];  Service: Endoscopy;;  . COLONOSCOPY  2013   Dr. Fuller Plan: multiple colon polyps (tubular adenomas), diverticulosis. no further surveillance colonoscopies due to age.  . ESOPHAGOGASTRODUODENOSCOPY  [id number]   Dr. Fuller Plan: gastric ulcer (h.pylori + s/p tx), follow up EGD verified ulcer healing  . ESOPHAGOGASTRODUODENOSCOPY N/A 09/26/2016   Procedure: ESOPHAGOGASTRODUODENOSCOPY (EGD);  Surgeon: Daneil Dolin, MD;  Location: [facility];  Service: Endoscopy;  Laterality: N/A;  . ESOPHAGOGASTRODUODENOSCOPY N/A 01/23/2017   Procedure: ESOPHAGOGASTRODUODENOSCOPY (EGD);  Surgeon: Daneil Dolin, MD;  Location: [facility];  Service: Endoscopy;  Laterality: N/A;  12:15pm  . ESOPHAGOGASTRODUODENOSCOPY N/A 07/06/2019   Procedure: ESOPHAGOGASTRODUODENOSCOPY (EGD);  Surgeon: Daneil Dolin, MD; normal esophagus, nonbleeding gastric ulcer with pigmented material, abnormal gastric mucosa of uncertain significance s/p biopsy, normal duodenum.  Pathology with mild reactive gastropathy, mild chronic gastritis, H. pylori negative.  Marland Kitchen [place]  . LAPAROSCOPIC CHOLECYSTECTOMY  2003  . PROSTATECTOMY  2002    Current Outpatient Medications  Medication Sig Dispense Refill  . acetaminophen (TYLENOL) 325 MG tablet Take  2 tablets (650 mg total) by mouth every 6 (six) hours as needed for mild pain (or Fever >/= 101).    . Multiple Vitamin (MULTIVITAMIN) tablet Take 1 tablet by mouth daily.    . Omega-3 1000 MG CAPS Take 2,000 mg by mouth daily.      . pantoprazole (PROTONIX) 40 MG tablet Take 1 tablet (40 mg total) by mouth 2 (two) times daily. 180 tablet 0  . Probiotic Product (PROBIOTIC DAILY PO) Take by mouth.     No current facility-administered medications for this visit.    Allergies as of 08/11/2019  . (No Known Allergies)    Family History  Problem Relation Age of Onset  . Prostate cancer Brother   . Prostate cancer Brother   . Suicidality Brother   . Prostate cancer Brother   . Stomach cancer Neg Hx   . Colon cancer Neg Hx     Social History   Socioeconomic History  . Marital status: Married    Spouse name: Not on file  . Number of children: Not on file  . Years of education: Not on file  . Highest education level: Not on file  Occupational History  . Occupation: retired Retail buyer  Tobacco Use  . Smoking status: Never Smoker  . Smokeless tobacco: Never Used  Substance and Sexual Activity  . Alcohol use: Not Currently    Comment: last drank about 15 years ago  . Drug use: Never  . Sexual activity: Not on file  Other Topics Concern  . Not on file  Social History Narrative  . Not on file   Social Determinants of Health   Financial Resource Strain:   . Difficulty of Paying Living Expenses: Not on file  Food Insecurity:   . Worried About Charity fundraiser in the Last Year: Not on file  . Ran Out of Food in the Last Year: Not on file  Transportation Needs:   . Lack of Transportation (Medical): Not on file  . Lack of Transportation (Non-Medical): Not on file  Physical Activity:   . Days of Exercise per Week: Not on file  . Minutes of Exercise per Session: Not on file  Stress:   . Feeling of Stress : Not on file  Social Connections:   . Frequency of Communication with Friends and Family: Not on file  . Frequency of Social Gatherings with Friends and Family: Not on file  . Attends Religious Services: Not on file  . Active Member of Clubs or Organizations: Not on file  . Attends Theatre manager Meetings: Not on file  . Marital Status: Not on file    Review of Systems: Gen: See HPI CV: See HPI Resp: See HPI GI: See HPI Derm: Denies rash Psych: Denies depression, anxiety Heme: Denies bruising, bleeding  Physical Exam: BP 134/78   Pulse 75   Temp (!) 96.2 F (35.7 C) (Temporal)   Ht 5\' 11"  (1.803 m)   Wt 241 lb 3.2 oz (109.4 kg)   BMI 33.64 kg/m  General:   Alert and oriented. No distress noted. Pleasant and cooperative.  Head:  Normocephalic and atraumatic. Eyes:  Conjuctiva clear without scleral icterus. Heart:  S1, S2 present without murmurs appreciated. Lungs:  Clear to auscultation bilaterally. No wheezes, rales, or rhonchi. No distress.  Abdomen:  +BS, soft, non-tender and non-distended. No rebound or guarding. No HSM or masses noted.  Msk:  Symmetrical without gross deformities. Normal posture. Extremities: 1+ bilateral lower extremity  pitting edema.  He was advised to follow-up with PCP on this.  Neurologic:  Alert and  oriented x4 Psych:  Normal mood and affect.

## 2019-08-11 ENCOUNTER — Other Ambulatory Visit: Payer: Self-pay

## 2019-08-11 ENCOUNTER — Other Ambulatory Visit: Payer: Self-pay | Admitting: *Deleted

## 2019-08-11 ENCOUNTER — Ambulatory Visit (INDEPENDENT_AMBULATORY_CARE_PROVIDER_SITE_OTHER): Payer: Medicare Other | Admitting: Gastroenterology

## 2019-08-11 ENCOUNTER — Encounter: Payer: Self-pay | Admitting: Gastroenterology

## 2019-08-11 ENCOUNTER — Encounter: Payer: Self-pay | Admitting: *Deleted

## 2019-08-11 DIAGNOSIS — K3189 Other diseases of stomach and duodenum: Secondary | ICD-10-CM | POA: Diagnosis not present

## 2019-08-11 DIAGNOSIS — Z8711 Personal history of peptic ulcer disease: Secondary | ICD-10-CM

## 2019-08-11 DIAGNOSIS — K259 Gastric ulcer, unspecified as acute or chronic, without hemorrhage or perforation: Secondary | ICD-10-CM | POA: Diagnosis not present

## 2019-08-11 DIAGNOSIS — Z79899 Other long term (current) drug therapy: Secondary | ICD-10-CM

## 2019-08-11 DIAGNOSIS — Z8719 Personal history of other diseases of the digestive system: Secondary | ICD-10-CM

## 2019-08-11 DIAGNOSIS — K922 Gastrointestinal hemorrhage, unspecified: Secondary | ICD-10-CM | POA: Diagnosis not present

## 2019-08-11 DIAGNOSIS — K921 Melena: Secondary | ICD-10-CM | POA: Diagnosis not present

## 2019-08-11 DIAGNOSIS — M179 Osteoarthritis of knee, unspecified: Secondary | ICD-10-CM | POA: Diagnosis not present

## 2019-08-11 DIAGNOSIS — D649 Anemia, unspecified: Secondary | ICD-10-CM | POA: Diagnosis not present

## 2019-08-11 NOTE — Patient Instructions (Signed)
Have labs completed.  We will get you scheduled for an upper endoscopy in the near future with Dr. Gala Romney to follow-up on the ulcer in your stomach.  Continue taking Protonix twice daily for now.  We will follow-up with you as needed after your endoscopy.  Do not hesitate to call if you have questions or concerns.  Aliene Altes, PA-C [facility]

## 2019-08-11 NOTE — Assessment & Plan Note (Addendum)
85 year old male with recent upper GI bleed secondary to gastric ulcer (likely NSAID induced) identified on EGD on 07/06/2019 presenting for follow-up.  Complete findings on EGD included normal esophagus, nonbleeding gastric ulcer with pigmented material, abnormal gastric mucosa of uncertain significance s/p biopsy, and normal duodenum.  Pathology with mild reactive gastropathy, mild chronic gastritis, H. pylori negative.  Patient has continued on Protonix twice daily and has discontinued meloxicam.  Denies other NSAIDs.  No further overt GI bleeding.  No other significant upper or lower GI symptoms.  He has not had updated labs since his hospitalization.  Hemoglobin discharge was 9.9 on 07/07/19.    Gastric ulcer likely secondary to NSAIDs.  Explained the importance of avoiding all NSAIDs moving forward.  Continue Protonix 40 mg twice daily. Continue to avoid all NSAIDs. Update CBC to ensure hemoglobin is improving. He is due for repeat EGD with Dr. Gala Romney in February 2021.  We will go ahead and schedule this.  The risks, benefits, and alternatives have been discussed in detail with patient. They have stated understanding and desire to proceed.  Follow-up after EGD as needed.

## 2019-08-12 ENCOUNTER — Other Ambulatory Visit: Payer: Medicare Other

## 2019-08-13 LAB — CBC WITH DIFFERENTIAL/PLATELET
Basophils Absolute: 0.1 10*3/uL (ref 0.0–0.2)
Basos: 1 %
EOS (ABSOLUTE): 0.1 10*3/uL (ref 0.0–0.4)
Eos: 1 %
Hematocrit: 35.3 % — ABNORMAL LOW (ref 37.5–51.0)
Hemoglobin: 11.2 g/dL — ABNORMAL LOW (ref 13.0–17.7)
Immature Grans (Abs): 0 10*3/uL (ref 0.0–0.1)
Immature Granulocytes: 0 %
Lymphocytes Absolute: 1.4 10*3/uL (ref 0.7–3.1)
Lymphs: 23 %
MCH: 28.6 pg (ref 26.6–33.0)
MCHC: 31.7 g/dL (ref 31.5–35.7)
MCV: 90 fL (ref 79–97)
Monocytes Absolute: 0.8 10*3/uL (ref 0.1–0.9)
Monocytes: 13 %
Neutrophils Absolute: 3.8 10*3/uL (ref 1.4–7.0)
Neutrophils: 62 %
Platelets: 215 10*3/uL (ref 150–450)
RBC: 3.92 x10E6/uL — ABNORMAL LOW (ref 4.14–5.80)
RDW: 14.3 % (ref 11.6–15.4)
WBC: 6.1 10*3/uL (ref 3.4–10.8)

## 2019-08-13 NOTE — Progress Notes (Signed)
Hemoglobin improved from 9.9 at the time of hospital discharge 1 month ago to 11.2. This is still low, but improving and should continue to improve.   Continue on Protonix 40 mg BID and proceed with EGD as scheduled in February.

## 2019-08-18 ENCOUNTER — Other Ambulatory Visit: Payer: Self-pay | Admitting: Family Medicine

## 2019-08-18 DIAGNOSIS — M1812 Unilateral primary osteoarthritis of first carpometacarpal joint, left hand: Secondary | ICD-10-CM

## 2019-08-18 DIAGNOSIS — M1811 Unilateral primary osteoarthritis of first carpometacarpal joint, right hand: Secondary | ICD-10-CM

## 2019-08-18 DIAGNOSIS — M1711 Unilateral primary osteoarthritis, right knee: Secondary | ICD-10-CM

## 2019-08-24 DIAGNOSIS — D649 Anemia, unspecified: Secondary | ICD-10-CM | POA: Diagnosis not present

## 2019-08-24 DIAGNOSIS — K259 Gastric ulcer, unspecified as acute or chronic, without hemorrhage or perforation: Secondary | ICD-10-CM | POA: Diagnosis not present

## 2019-08-24 DIAGNOSIS — M179 Osteoarthritis of knee, unspecified: Secondary | ICD-10-CM | POA: Diagnosis not present

## 2019-08-24 DIAGNOSIS — K3189 Other diseases of stomach and duodenum: Secondary | ICD-10-CM | POA: Diagnosis not present

## 2019-08-24 DIAGNOSIS — K922 Gastrointestinal hemorrhage, unspecified: Secondary | ICD-10-CM | POA: Diagnosis not present

## 2019-08-24 DIAGNOSIS — K921 Melena: Secondary | ICD-10-CM | POA: Diagnosis not present

## 2019-09-02 DIAGNOSIS — K3189 Other diseases of stomach and duodenum: Secondary | ICD-10-CM | POA: Diagnosis not present

## 2019-09-02 DIAGNOSIS — K921 Melena: Secondary | ICD-10-CM | POA: Diagnosis not present

## 2019-09-02 DIAGNOSIS — K922 Gastrointestinal hemorrhage, unspecified: Secondary | ICD-10-CM | POA: Diagnosis not present

## 2019-09-02 DIAGNOSIS — K259 Gastric ulcer, unspecified as acute or chronic, without hemorrhage or perforation: Secondary | ICD-10-CM | POA: Diagnosis not present

## 2019-09-02 DIAGNOSIS — M179 Osteoarthritis of knee, unspecified: Secondary | ICD-10-CM | POA: Diagnosis not present

## 2019-09-02 DIAGNOSIS — D649 Anemia, unspecified: Secondary | ICD-10-CM | POA: Diagnosis not present

## 2019-09-18 ENCOUNTER — Encounter: Payer: Self-pay | Admitting: Family Medicine

## 2019-09-25 ENCOUNTER — Ambulatory Visit: Payer: Medicare Other

## 2019-10-03 ENCOUNTER — Ambulatory Visit: Payer: Medicare Other | Attending: Internal Medicine

## 2019-10-03 DIAGNOSIS — Z23 Encounter for immunization: Secondary | ICD-10-CM | POA: Insufficient documentation

## 2019-10-03 NOTE — Progress Notes (Signed)
   Covid-19 Vaccination Clinic  Name:  Gregory Pearson    MRN: [id number] DOB: 01-30-1928  10/03/2019  [name] was observed post Covid-19 immunization for 15 minutes without incidence. He was provided with Vaccine Information Sheet and instruction to access the V-Safe system.   [name] was instructed to call 911 with any severe reactions post vaccine: Marland Kitchen Difficulty breathing  . Swelling of your face and throat  . A fast heartbeat  . A bad rash all over your body  . Dizziness and weakness    Immunizations Administered    Name Date Dose VIS Date Route   Pfizer COVID-19 Vaccine 10/03/2019  8:51 AM 0.3 mL 08/06/2019 Intramuscular   Manufacturer: [place]   Lot: [id number]   [place]: [id number]

## 2019-10-11 ENCOUNTER — Other Ambulatory Visit (HOSPITAL_COMMUNITY): Payer: PRIVATE HEALTH INSURANCE

## 2019-10-16 ENCOUNTER — Ambulatory Visit: Payer: Medicare Other

## 2019-10-18 ENCOUNTER — Other Ambulatory Visit: Payer: Self-pay

## 2019-10-18 ENCOUNTER — Other Ambulatory Visit (HOSPITAL_COMMUNITY)
Admission: RE | Admit: 2019-10-18 | Discharge: 2019-10-18 | Disposition: A | Discharge status: Home / Self Care | Payer: Medicare Other | Source: Ambulatory Visit | Attending: Internal Medicine | Admitting: Internal Medicine

## 2019-10-18 ENCOUNTER — Ambulatory Visit: Payer: Medicare Other

## 2019-10-18 DIAGNOSIS — Z01812 Encounter for preprocedural laboratory examination: Secondary | ICD-10-CM | POA: Insufficient documentation

## 2019-10-18 DIAGNOSIS — Z20822 Contact with and (suspected) exposure to covid-19: Secondary | ICD-10-CM | POA: Insufficient documentation

## 2019-10-18 LAB — SARS CORONAVIRUS 2 (TAT 6-24 HRS): SARS Coronavirus 2: NEGATIVE

## 2019-10-20 ENCOUNTER — Ambulatory Visit (HOSPITAL_COMMUNITY)
Admission: RE | Admit: 2019-10-20 | Discharge: 2019-10-20 | Disposition: A | Discharge status: Home / Self Care | Payer: Medicare Other | Attending: Internal Medicine | Admitting: Internal Medicine

## 2019-10-20 ENCOUNTER — Encounter (HOSPITAL_COMMUNITY)
Admission: RE | Disposition: A | Discharge status: Home / Self Care | Payer: Self-pay | Source: Home / Self Care | Attending: Internal Medicine

## 2019-10-20 ENCOUNTER — Encounter: Payer: Self-pay | Admitting: Internal Medicine

## 2019-10-20 ENCOUNTER — Other Ambulatory Visit: Payer: Self-pay

## 2019-10-20 ENCOUNTER — Encounter (HOSPITAL_COMMUNITY): Payer: Self-pay | Admitting: Internal Medicine

## 2019-10-20 DIAGNOSIS — Z8719 Personal history of other diseases of the digestive system: Secondary | ICD-10-CM

## 2019-10-20 DIAGNOSIS — K219 Gastro-esophageal reflux disease without esophagitis: Secondary | ICD-10-CM | POA: Diagnosis not present

## 2019-10-20 DIAGNOSIS — Z9079 Acquired absence of other genital organ(s): Secondary | ICD-10-CM | POA: Diagnosis not present

## 2019-10-20 DIAGNOSIS — Z9049 Acquired absence of other specified parts of digestive tract: Secondary | ICD-10-CM | POA: Diagnosis not present

## 2019-10-20 DIAGNOSIS — Z8042 Family history of malignant neoplasm of prostate: Secondary | ICD-10-CM | POA: Diagnosis not present

## 2019-10-20 DIAGNOSIS — Z13810 Encounter for screening for upper gastrointestinal disorder: Secondary | ICD-10-CM | POA: Diagnosis not present

## 2019-10-20 DIAGNOSIS — Z79899 Other long term (current) drug therapy: Secondary | ICD-10-CM | POA: Diagnosis not present

## 2019-10-20 DIAGNOSIS — Z8711 Personal history of peptic ulcer disease: Secondary | ICD-10-CM | POA: Diagnosis not present

## 2019-10-20 DIAGNOSIS — Z8546 Personal history of malignant neoplasm of prostate: Secondary | ICD-10-CM | POA: Diagnosis not present

## 2019-10-20 DIAGNOSIS — E785 Hyperlipidemia, unspecified: Secondary | ICD-10-CM | POA: Insufficient documentation

## 2019-10-20 HISTORY — PX: ESOPHAGOGASTRODUODENOSCOPY: SHX5428

## 2019-10-20 SURGERY — EGD (ESOPHAGOGASTRODUODENOSCOPY)
Anesthesia: Moderate Sedation

## 2019-10-20 MED ORDER — ONDANSETRON HCL 4 MG/2ML IJ SOLN
INTRAMUSCULAR | Status: AC
  Filled 2019-10-20: qty 2

## 2019-10-20 MED ORDER — LIDOCAINE VISCOUS HCL 2 % MT SOLN
OROMUCOSAL | Status: DC | PRN
  Administered 2019-10-20: 4 mL via OROMUCOSAL

## 2019-10-20 MED ORDER — MIDAZOLAM HCL 5 MG/5ML IJ SOLN
INTRAMUSCULAR | Status: DC | PRN
  Administered 2019-10-20 (×2): 1 mg via INTRAVENOUS

## 2019-10-20 MED ORDER — MEPERIDINE HCL 100 MG/ML IJ SOLN
INTRAMUSCULAR | Status: DC | PRN
  Administered 2019-10-20: 25 mg via INTRAVENOUS

## 2019-10-20 MED ORDER — MIDAZOLAM HCL 5 MG/5ML IJ SOLN
INTRAMUSCULAR | Status: AC
  Filled 2019-10-20: qty 10

## 2019-10-20 MED ORDER — LIDOCAINE VISCOUS HCL 2 % MT SOLN
OROMUCOSAL | Status: AC
  Filled 2019-10-20: qty 15

## 2019-10-20 MED ORDER — SODIUM CHLORIDE 0.9 % IV SOLN: INTRAVENOUS | Status: DC

## 2019-10-20 MED ORDER — MEPERIDINE HCL 50 MG/ML IJ SOLN
INTRAMUSCULAR | Status: AC
  Filled 2019-10-20: qty 1

## 2019-10-20 MED ORDER — STERILE WATER FOR IRRIGATION IR SOLN: Status: DC | PRN

## 2019-10-20 NOTE — Op Note (Signed)
[facility] Patient Name: Gregory Pearson Procedure Date: 10/20/2019 8:32 AM MRN: [id number] Date of Birth: May 19, 1928 Attending MD: Norvel Richards , MD CSN: [id number] Age: 86 Admit Type: Outpatient Procedure:                Upper GI endoscopy Indications:              Screening procedure Providers:                Norvel Richards, MD, Rosina Lowenstein, RN, Aram Candela Referring MD:              Medicines:                Midazolam 2 mg IV, Meperidine 25 mg IV Complications:            No immediate complications. Estimated Blood Loss:     Estimated blood loss: none. Procedure:                Pre-Anesthesia Assessment:                           - Prior to the procedure, a History and Physical                            was performed, and patient medications and                            allergies were reviewed. The patient's tolerance of                            previous anesthesia was also reviewed. The risks                            and benefits of the procedure and the sedation                            options and risks were discussed with the patient.                            All questions were answered, and informed consent                            was obtained. Prior Anticoagulants: The patient has                            taken no previous anticoagulant or antiplatelet                            agents. ASA Grade Assessment: II - A patient with                            mild systemic disease. After reviewing the risks  and benefits, the patient was deemed in                            satisfactory condition to undergo the procedure.                           After obtaining informed consent, the endoscope was                            passed under direct vision. Throughout the                            procedure, the patient's blood pressure, pulse, and                            oxygen saturations  were monitored continuously. The                            [id number] scope was introduced through the                            mouth, and advanced to the second part of duodenum.                            The upper GI endoscopy was accomplished without                            difficulty. Scope In: 9:10:11 AM Scope Out: 9:13:15 AM Total Procedure Duration: 0 hours 3 minutes 4 seconds  Findings:      The examined esophagus was normal.      Minimal retained gastric contents in the fundus.Gregory Pearson noted antral       ulcer completely healed with appropriate scar present.      The duodenal bulb and second portion of the duodenum were normal. Impression:               Minimal retained gastric contents. Stomach                            incompletely seen. Previously noted gastric ulcer                            completely healed with scar present                           - No specimens collected. Moderate Sedation:      Moderate (conscious) sedation was personally administered by the       endoscopist. The following parameters were monitored: oxygen saturation,       heart rate, blood pressure, respiratory rate, EKG, adequacy of pulmonary       ventilation, and response to care. Total physician intraservice time was       9 minutes. Recommendation:           - Patient has a contact number available for  emergencies. The signs and symptoms of potential                            delayed complications were discussed with the                            patient. Return to normal activities tomorrow.                            Written discharge instructions were provided to the                            patient.                           - Advance diet as tolerated.                           - Continue present medications. Avoid all                            nonsteroidal agents. At this time, I discontinue                            Protonix long  as no NSAIDs.                           -Follow-up with [place] as needed. Procedure Code(s):        --- Professional ---                           [phone], Esophagogastroduodenoscopy, flexible,                            transoral; diagnostic, including collection of                            specimen(s) by brushing or washing, when performed                            (separate procedure) Diagnosis Code(s):        --- Professional ---                           Z13.810, Encounter for screening for upper                            gastrointestinal disorder CPT copyright 2019 [employer] Medical [employer]. All rights reserved. The codes documented in this report are preliminary and upon coder review may  be revised to meet current compliance requirements. Gregory Pearson. Gregory Cosma, MD Norvel Richards, MD 10/20/2019 9:28:18 AM This report has been signed electronically. Number of Addenda: 0

## 2019-10-20 NOTE — H&P (Signed)
@LOGO @   Primary Care Physician:  Dettinger, Fransisca Kaufmann, MD Primary Gastroenterologist:  Dr. Gala Romney  Pre-Procedure History & Physical: HPI:  Gregory Pearson is a 86 y.o. male here for  surveillance EGD.  History of NSAID induced ulcer prior EGD about 3 months ago.  No H. pylori.  Biopsies negative.  No longer taking nonsteroidal agents.  No GI symptoms currently.  Past Medical History:  Diagnosis Date  . Asbestos exposure   . Blood transfusion without reported diagnosis    after ruptured appendix 1963  . Diverticulosis   . Gastric ulcer 06/2019  . GERD (gastroesophageal reflux disease)   . History of stomach ulcers 1995   H.pylori s/p treatment  . History of stomach ulcers   . Hyperlipidemia   . Muscle strain of right gluteal region 01/24/2014  . Prostate cancer [facility]) 2002   prostatectomy  . Traumatic hematoma of buttock 01/24/2014    Past Surgical History:  Procedure Laterality Date  . APPENDECTOMY  1963   ruptured appendix/gangrene  . BIOPSY  07/06/2019   Procedure: BIOPSY;  Surgeon: Daneil Dolin, MD;  [facility];  Service: Endoscopy;;  . COLONOSCOPY  2013   Dr. Fuller Plan: multiple colon polyps (tubular adenomas), diverticulosis. no further surveillance colonoscopies due to age.  . ESOPHAGOGASTRODUODENOSCOPY  [id number]   Dr. Fuller Plan: gastric ulcer (h.pylori + s/p tx), follow up EGD verified ulcer healing  . ESOPHAGOGASTRODUODENOSCOPY N/A 09/26/2016   Procedure: ESOPHAGOGASTRODUODENOSCOPY (EGD);  Surgeon: Daneil Dolin, MD;  [facility];  Service: Endoscopy;  Laterality: N/A;  . ESOPHAGOGASTRODUODENOSCOPY N/A 01/23/2017   Procedure: ESOPHAGOGASTRODUODENOSCOPY (EGD);  Surgeon: Daneil Dolin, MD;  [facility];  Service: Endoscopy;  Laterality: N/A;  12:15pm  . ESOPHAGOGASTRODUODENOSCOPY N/A 07/06/2019   Procedure: ESOPHAGOGASTRODUODENOSCOPY (EGD);  Surgeon: Daneil Dolin, MD; normal esophagus, nonbleeding gastric ulcer with pigmented material,  abnormal gastric mucosa of uncertain significance s/p biopsy, normal duodenum.  Pathology with mild reactive gastropathy, mild chronic gastritis, H. pylori negative.  Marland Kitchen [place]  . LAPAROSCOPIC CHOLECYSTECTOMY  2003  . PROSTATECTOMY  2002    Prior to Admission medications   Medication Sig Start Date End Date Taking? Authorizing Provider  acetaminophen (TYLENOL) 325 MG tablet Take 2 tablets (650 mg total) by mouth every 6 (six) hours as needed for mild pain (or Fever >/= 101). Patient taking differently: Take 650 mg by mouth every 6 (six) hours as needed for mild pain or headache (or Fever >/= 101).  07/07/19  Yes Johnson, Clanford L, MD  Multiple Vitamin (MULTIVITAMIN) tablet Take 1 tablet by mouth daily.   Yes [provider]  Omega-3 1000 MG CAPS Take 2,000 mg by mouth daily.   Yes [provider]  pantoprazole (PROTONIX) 40 MG tablet Take 1 tablet (40 mg total) by mouth 2 (two) times daily. Patient taking differently: Take 40 mg by mouth daily.  07/07/19 10/15/19 Yes Johnson, Clanford L, MD  Probiotic Product (PROBIOTIC DAILY PO) Take by mouth.   Yes [provider]    Allergies as of 08/11/2019  . (No Known Allergies)    Family History  Problem Relation Age of Onset  . Prostate cancer Brother   . Prostate cancer Brother   . Suicidality Brother   . Prostate cancer Brother   . Stomach cancer Neg Hx   . Colon cancer Neg Hx     Social History   Socioeconomic History  . Marital status: Married    Spouse name: Not  on file  . Number of children: Not on file  . Years of education: Not on file  . Highest education level: Not on file  Occupational History  . Occupation: retired Retail buyer  Tobacco Use  . Smoking status: Never Smoker  . Smokeless tobacco: Never Used  Substance and Sexual Activity  . Alcohol use: Not Currently    Comment: last drank about 15 years ago  . Drug use: Never  . Sexual activity: Not on file  Other Topics  Concern  . Not on file  Social History Narrative  . Not on file   Social Determinants of Health   Financial Resource Strain:   . Difficulty of Paying Living Expenses: Not on file  Food Insecurity:   . Worried About Charity fundraiser in the Last Year: Not on file  . Ran Out of Food in the Last Year: Not on file  Transportation Needs:   . Lack of Transportation (Medical): Not on file  . Lack of Transportation (Non-Medical): Not on file  Physical Activity:   . Days of Exercise per Week: Not on file  . Minutes of Exercise per Session: Not on file  Stress:   . Feeling of Stress : Not on file  Social Connections:   . Frequency of Communication with Friends and Family: Not on file  . Frequency of Social Gatherings with Friends and Family: Not on file  . Attends Religious Services: Not on file  . Active Member of Clubs or Organizations: Not on file  . Attends Archivist Meetings: Not on file  . Marital Status: Not on file  Intimate Partner Violence:   . Fear of Current or Ex-Partner: Not on file  . Emotionally Abused: Not on file  . Physically Abused: Not on file  . Sexually Abused: Not on file    Review of Systems: See HPI, otherwise negative ROS  Physical Exam: BP (!) 146/86   Pulse 85   Temp (!) 97.5 F (36.4 C) (Oral)   Resp 18   Ht 6' (1.829 m)   Wt 104.3 kg   SpO2 96%   BMI 31.19 kg/m  General:   Alert,  Well-developed, well-nourished, pleasant and cooperative in NAD Mouth:  No deformity or lesions. Neck:  Supple; no masses or thyromegaly. No significant cervical adenopathy. Lungs:  Clear throughout to auscultation.   No wheezes, crackles, or rhonchi. No acute distress. Heart:  Regular rate and rhythm; no murmurs, clicks, rubs,  or gallops. Abdomen: Non-distended, normal bowel sounds.  Soft and nontender without appreciable mass or hepatosplenomegaly.  Pulses:  Normal pulses noted. Extremities:  Without clubbing or edema.   Impression: 86 year old  gent with a history of probable NSAID induced gastric ulcer.  Clinically doing well.  Here for surveillance EGD per plan.  The risks, benefits, limitations, alternatives and imponderables have been reviewed with the patient. Potential for esophageal dilation, biopsy, etc. have also been reviewed.  Questions have been answered. All parties agreeable.    Notice: This dictation was prepared with Dragon dictation along with smaller phrase technology. Any transcriptional errors that result from this process are unintentional and may not be corrected upon review.

## 2019-10-20 NOTE — Discharge Instructions (Signed)
EGD Discharge instructions Please read the instructions outlined below and refer to this sheet in the next few weeks. These discharge instructions provide you with general information on caring for yourself after you leave the hospital. Your doctor may also give you specific instructions. While your treatment has been planned according to the most current medical practices available, unavoidable complications occasionally occur. If you have any problems or questions after discharge, please call your doctor. ACTIVITY  You may resume your regular activity but move at a slower pace for the next 24 hours.   Take frequent rest periods for the next 24 hours.   Walking will help expel (get rid of) the air and reduce the bloated feeling in your abdomen.   No driving for 24 hours (because of the anesthesia (medicine) used during the test).   You may shower.   Do not sign any important legal documents or operate any machinery for 24 hours (because of the anesthesia used during the test).  NUTRITION  Drink plenty of fluids.   You may resume your normal diet.   Begin with a light meal and progress to your normal diet.   Avoid alcoholic beverages for 24 hours or as instructed by your caregiver.  MEDICATIONS  You may resume your normal medications unless your caregiver tells you otherwise.  WHAT YOU CAN EXPECT TODAY  You may experience abdominal discomfort such as a feeling of fullness or "gas" pains.  FOLLOW-UP  Your doctor will discuss the results of your test with you.  SEEK IMMEDIATE MEDICAL ATTENTION IF ANY OF THE FOLLOWING OCCUR:  Excessive nausea (feeling sick to your stomach) and/or vomiting.   Severe abdominal pain and distention (swelling).   Trouble swallowing.   Temperature over 101 F (37.8 C).   Rectal bleeding or vomiting of blood.   Your ulcer is completely healed.  Please avoid all forms of NSAIDs in the future.  Not aspirin or acetaminophen products okay  So  long as you stop taking the above-mentioned medications you may stop taking Protonix now  Follow-up in our office as needed  At patient request, I called [place] at [phone] and reviewed results.

## 2019-10-28 ENCOUNTER — Ambulatory Visit: Payer: Medicare Other | Attending: Internal Medicine

## 2019-10-28 DIAGNOSIS — Z23 Encounter for immunization: Secondary | ICD-10-CM | POA: Insufficient documentation

## 2019-10-28 NOTE — Progress Notes (Signed)
   Covid-19 Vaccination Clinic  Name:  Gregory Pearson    MRN: [id number] DOB: 06-29-1928  10/28/2019  Gregory Pearson was observed post Covid-19 immunization for 15 minutes without incident. He was provided with Vaccine Information Sheet and instruction to access the V-Safe system.   Gregory Pearson was instructed to call 911 with any severe reactions post vaccine: Marland Kitchen Difficulty breathing  . Swelling of face and throat  . A fast heartbeat  . A bad rash all over body  . Dizziness and weakness   Immunizations Administered    Name Date Dose VIS Date Route   Pfizer COVID-19 Vaccine 10/28/2019  9:46 AM 0.3 mL 08/06/2019 Intramuscular   Manufacturer: [place]   Lot: [id number]   [place]: [id number]

## 2019-11-29 ENCOUNTER — Telehealth: Payer: Self-pay | Admitting: Family Medicine

## 2019-11-29 NOTE — Telephone Encounter (Signed)
Patient has a follow up appointment scheduled. 

## 2019-12-01 ENCOUNTER — Encounter: Payer: Self-pay | Admitting: Family Medicine

## 2019-12-01 ENCOUNTER — Other Ambulatory Visit: Payer: Self-pay

## 2019-12-01 ENCOUNTER — Ambulatory Visit (INDEPENDENT_AMBULATORY_CARE_PROVIDER_SITE_OTHER): Payer: Medicare Other | Admitting: Family Medicine

## 2019-12-01 ENCOUNTER — Ambulatory Visit: Payer: Medicare Other | Admitting: Family Medicine

## 2019-12-01 VITALS — BP 134/80 | HR 81 | Temp 98.9°F | Ht 72.0 in | Wt 235.2 lb

## 2019-12-01 DIAGNOSIS — M170 Bilateral primary osteoarthritis of knee: Secondary | ICD-10-CM | POA: Diagnosis not present

## 2019-12-01 MED ORDER — NABUMETONE 500 MG PO TABS: 1000.0000 mg | ORAL_TABLET | Freq: Two times a day (BID) | ORAL | 1 refills | Status: DC

## 2019-12-01 NOTE — Patient Instructions (Signed)
Be sure to take your pantoprazole twice a day.   Take the new medicine for arthritis always with a ful stomach, one or two pills, twice daily

## 2019-12-01 NOTE — Progress Notes (Signed)
Chief Complaint  Patient presents with  . Knee Pain    Chronic    HPI  Patient presents today for bilateral knee pain. Is been chronic and he is having more more trouble ambulating particularly walking up and down steps. 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 He has taken medicine for the knees in the past that gave him ulcers. He is currently taking Protonix twice a day. The pain is mild to moderate when sitting or resting. It can be moderately severe with ambulation or climbing. The right is worse than the left. It looks larger to him. Review of his knee x-rays from Jan 10, 2018 shows advanced osteoarthritis in the medial compartments bilaterally with chondrocalcinosis.  PMH: Smoking status noted ROS: Per HPI  Objective: BP 134/80   Pulse 81   Temp 98.9 F (37.2 C) (Temporal)   Ht 6' (1.829 m)   Wt 235 lb 3.2 oz (106.7 kg)   BMI 31.90 kg/m  Gen: NAD, alert, cooperative with exam HEENT: NCAT, EOMI, PERRL CV: RRR, good S1/S2, no murmur Resp: CTABL, no wheezes, non-labored Ext: The right knee has surrounding edema. It has full range of motion with mild crepitance. The left has no edema. Minimal to mild crepitance. The McMurray and Lachman's tests are negative. No opening for collateral stress maneuvers. Neuro: Alert and oriented, No gross deficits  Assessment and plan:  1. Arthritis of both knees     Meds ordered this encounter  Medications  . nabumetone (RELAFEN) 500 MG tablet    Sig: Take 2 tablets (1,000 mg total) by mouth 2 (two) times daily. For muscle and joint pain    Dispense:  360 tablet    Refill:  1    Orders Placed This Encounter  Procedures  . Ambulatory referral to Orthopedics    Referral Priority:   Routine    Referral Type:   Consultation    Number of Visits Requested:   1   I discussed with him the risk of NSAIDs. He should get a great deal of protection from the Protonix if he will continue to take it diligently twice daily. I chose  nabumetone because of its nonacidic nature and history of causing much lower rates of gastritis and ulcer. Additionally I avoided doing cortisone injections since he will be seeing Ortho. If they choose to do a procedure within the next 3 months it would be delayed by having had an injection. Patient understood although he had been interested in injections. I reminded him that orthopedics should be able to do those injections if they choose not to do a procedure. Otherwise he could come back here and they can be done here but it is important to get the orthopedist input first. Follow up as needed.  Claretta Fraise, MD

## 2019-12-08 DIAGNOSIS — M25561 Pain in right knee: Secondary | ICD-10-CM | POA: Insufficient documentation

## 2019-12-08 DIAGNOSIS — M25562 Pain in left knee: Secondary | ICD-10-CM | POA: Insufficient documentation

## 2019-12-09 DIAGNOSIS — M1711 Unilateral primary osteoarthritis, right knee: Secondary | ICD-10-CM | POA: Diagnosis not present

## 2019-12-09 DIAGNOSIS — M1712 Unilateral primary osteoarthritis, left knee: Secondary | ICD-10-CM | POA: Diagnosis not present

## 2019-12-09 DIAGNOSIS — M25561 Pain in right knee: Secondary | ICD-10-CM | POA: Diagnosis not present

## 2019-12-09 DIAGNOSIS — M170 Bilateral primary osteoarthritis of knee: Secondary | ICD-10-CM | POA: Diagnosis not present

## 2019-12-09 DIAGNOSIS — M25562 Pain in left knee: Secondary | ICD-10-CM | POA: Diagnosis not present

## 2019-12-13 ENCOUNTER — Encounter: Payer: Self-pay | Admitting: Physical Therapy

## 2019-12-13 ENCOUNTER — Other Ambulatory Visit: Payer: Self-pay

## 2019-12-13 ENCOUNTER — Ambulatory Visit: Payer: Medicare Other | Attending: Orthopedic Surgery | Admitting: Physical Therapy

## 2019-12-13 DIAGNOSIS — M25561 Pain in right knee: Secondary | ICD-10-CM | POA: Insufficient documentation

## 2019-12-13 DIAGNOSIS — M25562 Pain in left knee: Secondary | ICD-10-CM | POA: Diagnosis not present

## 2019-12-13 DIAGNOSIS — G8929 Other chronic pain: Secondary | ICD-10-CM | POA: Diagnosis not present

## 2019-12-13 DIAGNOSIS — M6281 Muscle weakness (generalized): Secondary | ICD-10-CM

## 2019-12-13 NOTE — Therapy (Signed)
[facility] [place], [place], [address] Phone: [phone]   Fax:  [phone]  Physical Therapy Evaluation  Patient Details  Name: Posie Jefcoat MRN: [id number] Date of Birth: 03/31/1928 Referring Provider (PT): Gaynelle Arabian MD   Encounter Date: 12/13/2019  PT End of Session - 12/13/19 1032    Visit Number  1    Number of Visits  8    Date for PT Re-Evaluation  01/10/20    PT Start Time  0945    PT Stop Time  1012    PT Time Calculation (min)  27 min    Activity Tolerance  Patient tolerated treatment well    Behavior During Therapy  [facility] for tasks assessed/performed       Past Medical History:  Diagnosis Date  . Asbestos exposure   . Blood transfusion without reported diagnosis    after ruptured appendix 1963  . Diverticulosis   . Gastric ulcer 06/2019  . GERD (gastroesophageal reflux disease)   . History of stomach ulcers 1995   H.pylori s/p treatment  . History of stomach ulcers   . Hyperlipidemia   . Muscle strain of right gluteal region 01/24/2014  . Prostate cancer [facility]) 2002   prostatectomy  . Traumatic hematoma of buttock 01/24/2014    Past Surgical History:  Procedure Laterality Date  . APPENDECTOMY  1963   ruptured appendix/gangrene  . BIOPSY  07/06/2019   Procedure: BIOPSY;  Surgeon: Daneil Dolin, MD;  [facility];  Service: Endoscopy;;  . COLONOSCOPY  2013   Dr. Fuller Plan: multiple colon polyps (tubular adenomas), diverticulosis. no further surveillance colonoscopies due to age.  . ESOPHAGOGASTRODUODENOSCOPY  [id number]   Dr. Fuller Plan: gastric ulcer (h.pylori + s/p tx), follow up EGD verified ulcer healing  . ESOPHAGOGASTRODUODENOSCOPY N/A 09/26/2016   Procedure: ESOPHAGOGASTRODUODENOSCOPY (EGD);  Surgeon: Daneil Dolin, MD;  [facility];  Service: Endoscopy;  Laterality: N/A;  . ESOPHAGOGASTRODUODENOSCOPY N/A 01/23/2017   Procedure: ESOPHAGOGASTRODUODENOSCOPY (EGD);  Surgeon:  Daneil Dolin, MD;  [facility];  Service: Endoscopy;  Laterality: N/A;  12:15pm  . ESOPHAGOGASTRODUODENOSCOPY N/A 07/06/2019   Procedure: ESOPHAGOGASTRODUODENOSCOPY (EGD);  Surgeon: Daneil Dolin, MD; normal esophagus, nonbleeding gastric ulcer with pigmented material, abnormal gastric mucosa of uncertain significance s/p biopsy, normal duodenum.  Pathology with mild reactive gastropathy, mild chronic gastritis, H. pylori negative.  . ESOPHAGOGASTRODUODENOSCOPY N/A 10/20/2019   Procedure: ESOPHAGOGASTRODUODENOSCOPY (EGD);  Surgeon: Daneil Dolin, MD;  [facility];  Service: Endoscopy;  Laterality: N/A;  9:30am  . [place]  . LAPAROSCOPIC CHOLECYSTECTOMY  2003  . PROSTATECTOMY  2002    There were no vitals filed for this visit.   Subjective Assessment - 12/13/19 1023    Subjective  COVID-19 screen performed prior to patient entering clinic.  The patient present sto the clinic today with c/o bilateral knee pain.  He states his pain has been quite low which he attributes to a medication he is taking.  He states he feels some weakness and would like to be able to perform stairs and geto up from chairs easier.  Rest decreases his pain.    Pertinent History  Right gluteal strain, GERD, prostatectomy.    How long can you walk comfortably?  Short community distances.    Diagnostic tests  X-rays.    Patient Stated Goals  See above.    Currently in Pain?  Yes    Pain Score  2  Both knees.   Pain Location  Knee    Pain Orientation  Right;Left    Pain Descriptors / Indicators  Dull;Discomfort    Pain Type  Chronic pain    Pain Onset  More than a month ago    Pain Frequency  Constant    Aggravating Factors   See above.    Pain Relieving Factors  Rest.         [facility] PT Assessment - 12/13/19 0001      Assessment   Medical Diagnosis  Bilateral knee pain.    Referring Provider (PT)  Gaynelle Arabian MD    Onset Date/Surgical Date  --   Ongoing.      Precautions   Precaution Comments  PAIN-FREE THER EX.      Restrictions   Weight Bearing Restrictions  No      Balance Screen   Has the patient fallen in the past 6 months  No    Has the patient had a decrease in activity level because of a fear of falling?   Yes    Is the patient reluctant to leave their home because of a fear of falling?   No      Home Film/video editor residence    Additional Comments  Stairs.      Prior Function   Level of Independence  Independent with community mobility with device      Observation/Other Assessments   Observations  Decreased bilateral patellar mobility.      Posture/Postural Control   Posture Comments  Right knee genu valgum.      ROM / Strength   AROM / PROM / Strength  AROM;Strength      AROM   Overall AROM Comments  Bilateral knee extension is essentially ful abd bilateral active flexion= 120 degrees.  Bilateral hamstring length via SLR ~50 degrees.      Strength   Overall Strength Comments  Right hip flexion and abduction= 4/5.  Knee strength via MMTing is normal.      Palpation   Palpation comment  Pain 'in" knees.      Transfers   Comments  Patient uses momentum and aremrest to go from sit to stand.      Ambulation/Gait   Gait Comments  Slow and cautious gait cycle with patient using a cane.  He also has a rolling walker that he will use on occasion.                Objective measurements completed on examination: See above findings.                   PT Long Term Goals - 12/13/19 1055      PT LONG TERM GOAL #1   Title  Independent with a HEP.    Time  4    Period  Weeks    Status  New      PT LONG TERM GOAL #2   Title  Sit to stand x 5 with minimal use of armrests and no use of momentum.    Time  4    Period  Weeks    Status  New      PT LONG TERM GOAL #3   Title  Right hip strength= 5/5.    Time  4    Period  Weeks    Status  New      PT LONG TERM GOAL  #4   Title  Perform a reciprocating stair gait with one railing.    Time  4    Period  Weeks    Status  New             Plan - 12/13/19 1045    Clinical Impression Statement  The patient presents to [facility] with c/o bilateral knee pain.  His range of motion is nearly normal.  He is a bit weaker in his right hip.  His right knee is remarkable for genu valgu.  He has decreased patellar mobility bilaterally.  He presented to the clinic today with a cane.  He struggles to transition from sitting to standing.  Patient will benefit from skilled physical therapy intervention to address deficits and pain.    Personal Factors and Comorbidities  Comorbidity 1;Comorbidity 2    Comorbidities  Right gluteal strain, GERD, prostatectomy.    Examination-Activity Limitations  Transfers;Stairs    Stability/Clinical Decision Making  Evolving/Moderate complexity    Clinical Decision Making  Low    Rehab Potential  Good    PT Frequency  2x / week    PT Duration  4 weeks    PT Treatment/Interventions  Cryotherapy;Electrical Stimulation;Moist Heat;Gait training;Stair training;Functional mobility training;Therapeutic activities;Therapeutic exercise;Manual techniques;Patient/family education;Passive range of motion;Joint Manipulations    PT Next Visit Plan  Patellar mobs, pain-free bilateral LE ther ex.    Consulted and Agree with Plan of Care  Patient       Patient will benefit from skilled therapeutic intervention in order to improve the following deficits and impairments:  Decreased activity tolerance, Decreased range of motion, Pain, Decreased strength  Visit Diagnosis: Chronic pain of right knee - Plan: PT plan of care cert/re-cert  Chronic pain of left knee - Plan: PT plan of care cert/re-cert  Muscle weakness (generalized) - Plan: PT plan of care cert/re-cert     Problem List Patient Active Problem List   Diagnosis Date Noted  . Gastric ulcer 08/11/2019  . History of gastric ulcer 08/11/2019   . GI bleed 07/06/2019  . History of GI bleed   . Melena   . Hyperglycemia 09/25/2016  . Venous stasis 05/17/2016  . Hyperlipidemia 07/11/2015  . Thrombocytopenia ([place]) 01/24/2014    Kyo Cocuzza, [place] MPT 12/13/2019, 10:58 AM  [facility] [address] [place], [place], [address] Phone: [phone]   Fax:  [phone]  Name: Maslah Gucciardo MRN: [id number] Date of Birth: May 30, 1928

## 2019-12-17 ENCOUNTER — Other Ambulatory Visit: Payer: Self-pay

## 2019-12-17 ENCOUNTER — Encounter: Payer: Self-pay | Admitting: Physical Therapy

## 2019-12-17 ENCOUNTER — Ambulatory Visit: Payer: Medicare Other | Admitting: Physical Therapy

## 2019-12-17 DIAGNOSIS — M25562 Pain in left knee: Secondary | ICD-10-CM | POA: Diagnosis not present

## 2019-12-17 DIAGNOSIS — M25561 Pain in right knee: Secondary | ICD-10-CM | POA: Diagnosis not present

## 2019-12-17 DIAGNOSIS — M6281 Muscle weakness (generalized): Secondary | ICD-10-CM

## 2019-12-17 DIAGNOSIS — G8929 Other chronic pain: Secondary | ICD-10-CM | POA: Diagnosis not present

## 2019-12-17 NOTE — Therapy (Signed)
[facility] [place], [place], [address] Phone: [phone]   Fax:  [phone]  Physical Therapy Treatment  Patient Details  Name: Gregory Pearson MRN: [id number] Date of Birth: Nov 19, 1927 Referring Provider (PT): Gaynelle Arabian MD   Encounter Date: 12/17/2019  PT End of Session - 12/17/19 0958    Visit Number  2    Number of Visits  8    PT Start Time  0945    PT Stop Time  1030    PT Time Calculation (min)  45 min    Activity Tolerance  Patient tolerated treatment well    Behavior During Therapy  [facility] for tasks assessed/performed       Past Medical History:  Diagnosis Date  . Asbestos exposure   . Blood transfusion without reported diagnosis    after ruptured appendix 1963  . Diverticulosis   . Gastric ulcer 06/2019  . GERD (gastroesophageal reflux disease)   . History of stomach ulcers 1995   H.pylori s/p treatment  . History of stomach ulcers   . Hyperlipidemia   . Muscle strain of right gluteal region 01/24/2014  . Prostate cancer [facility]) 2002   prostatectomy  . Traumatic hematoma of buttock 01/24/2014    Past Surgical History:  Procedure Laterality Date  . APPENDECTOMY  1963   ruptured appendix/gangrene  . BIOPSY  07/06/2019   Procedure: BIOPSY;  Surgeon: Daneil Dolin, MD;  [facility];  Service: Endoscopy;;  . COLONOSCOPY  2013   Dr. Fuller Plan: multiple colon polyps (tubular adenomas), diverticulosis. no further surveillance colonoscopies due to age.  . ESOPHAGOGASTRODUODENOSCOPY  [id number]   Dr. Fuller Plan: gastric ulcer (h.pylori + s/p tx), follow up EGD verified ulcer healing  . ESOPHAGOGASTRODUODENOSCOPY N/A 09/26/2016   Procedure: ESOPHAGOGASTRODUODENOSCOPY (EGD);  Surgeon: Daneil Dolin, MD;  [facility];  Service: Endoscopy;  Laterality: N/A;  . ESOPHAGOGASTRODUODENOSCOPY N/A 01/23/2017   Procedure: ESOPHAGOGASTRODUODENOSCOPY (EGD);  Surgeon: Daneil Dolin, MD;  [facility];  Service: Endoscopy;  Laterality: N/A;  12:15pm  . ESOPHAGOGASTRODUODENOSCOPY N/A 07/06/2019   Procedure: ESOPHAGOGASTRODUODENOSCOPY (EGD);  Surgeon: Daneil Dolin, MD; normal esophagus, nonbleeding gastric ulcer with pigmented material, abnormal gastric mucosa of uncertain significance s/p biopsy, normal duodenum.  Pathology with mild reactive gastropathy, mild chronic gastritis, H. pylori negative.  . ESOPHAGOGASTRODUODENOSCOPY N/A 10/20/2019   Procedure: ESOPHAGOGASTRODUODENOSCOPY (EGD);  Surgeon: Daneil Dolin, MD;  [facility];  Service: Endoscopy;  Laterality: N/A;  9:30am  . [place]  . LAPAROSCOPIC CHOLECYSTECTOMY  2003  . PROSTATECTOMY  2002    There were no vitals filed for this visit.  Subjective Assessment - 12/17/19 0955    Subjective  COVID-19 screen performed prior to patient entering clinic.  The patient present with stiffness and some soreness.    Pertinent History  Right gluteal strain, GERD, prostatectomy.    How long can you walk comfortably?  Short community distances.    Diagnostic tests  X-rays.    Currently in Pain?  No/denies    Pain Onset  More than a month ago                       [facility] Adult PT Treatment/Exercise - 12/17/19 0001      Exercises   Exercises  Knee/Hip      Knee/Hip Exercises: Stretches   Active Hamstring Stretch  Right;Left;2 reps;30 seconds    Gastroc Stretch  Right;Left;2 reps  Knee/Hip Exercises: Aerobic   Nustep  L5 x 15 minutes      Knee/Hip Exercises: Standing   Heel Raises  Both;15 reps    Hip Flexion  Stengthening;Both;15 reps    Hip Abduction  Stengthening;Both;15 reps    Hip Extension  Stengthening;Both;15 reps    Forward Step Up  Both;15 reps;Hand Hold: 2;Step Height: 6"    Other Standing Knee Exercises  marching x 20 reps with UE support      Knee/Hip Exercises: Seated   Long Arc Quad  Strengthening;Both;15 reps    Long Arc Quad Weight  4 lbs.    Ball Squeeze  x  15 holding 5 seconds    Clamshell with TheraBand  Red    Sit to [employer]  10 reps;without UE support             PT Education - 12/17/19 0957    Education Details  exercise technique    Person(s) Educated  Patient    Methods  Explanation;Demonstration    Comprehension  Verbalized understanding;Returned demonstration          PT Long Term Goals - 12/17/19 1001      PT LONG TERM GOAL #1   Title  Independent with a HEP.    Time  4    Period  Weeks    Status  On-going      PT LONG TERM GOAL #2   Title  Sit to stand x 5 with minimal use of armrests and no use of momentum.    Time  4    Period  Weeks    Status  On-going      PT LONG TERM GOAL #3   Title  Right hip strength= 5/5.    Period  Weeks    Status  On-going      PT LONG TERM GOAL #4   Title  Perform a reciprocating stair gait with one railing.    Period  Weeks    Status  On-going            Plan - 12/17/19 [id number]    Clinical Impression Statement  Pt arriving to therpay reporting soreness and no pain. Pt also reporting stiffness especially in the morning. Pt tolerating exercises well. Continue to progress strength, and balance. Continue skilled PT.    Personal Factors and Comorbidities  Comorbidity 1;Comorbidity 2    Comorbidities  Right gluteal strain, GERD, prostatectomy.    Examination-Activity Limitations  Transfers;Stairs    Stability/Clinical Decision Making  Evolving/Moderate complexity    Rehab Potential  Good    PT Frequency  2x / week    PT Duration  4 weeks    PT Treatment/Interventions  Cryotherapy;Electrical Stimulation;Moist Heat;Gait training;Stair training;Functional mobility training;Therapeutic activities;Therapeutic exercise;Manual techniques;Patient/family education;Passive range of motion;Joint Manipulations    PT Next Visit Plan  Patellar mobs, pain-free bilateral LE ther ex.    Consulted and Agree with Plan of Care  Patient       Patient will benefit from skilled therapeutic  intervention in order to improve the following deficits and impairments:  Decreased activity tolerance, Decreased range of motion, Pain, Decreased strength  Visit Diagnosis: Chronic pain of right knee  Chronic pain of left knee  Muscle weakness (generalized)     Problem List Patient Active Problem List   Diagnosis Date Noted  . Gastric ulcer 08/11/2019  . History of gastric ulcer 08/11/2019  . GI bleed 07/06/2019  . History of GI bleed   . Melena   .  Hyperglycemia 09/25/2016  . Venous stasis 05/17/2016  . Hyperlipidemia 07/11/2015  . Thrombocytopenia ([place]) 01/24/2014    Oretha Caprice, MPT 12/17/2019, 10:23 AM  [facility] [address] [place], [place], [address] Phone: [phone]   Fax:  [phone]  Name: Tremaine Dworaczyk MRN: [id number] Date of Birth: [birth year]

## 2019-12-20 ENCOUNTER — Other Ambulatory Visit: Payer: Self-pay

## 2019-12-20 ENCOUNTER — Ambulatory Visit: Payer: Medicare Other | Admitting: Physical Therapy

## 2019-12-20 ENCOUNTER — Encounter: Payer: Self-pay | Admitting: Physical Therapy

## 2019-12-20 DIAGNOSIS — G8929 Other chronic pain: Secondary | ICD-10-CM | POA: Diagnosis not present

## 2019-12-20 DIAGNOSIS — M25562 Pain in left knee: Secondary | ICD-10-CM | POA: Diagnosis not present

## 2019-12-20 DIAGNOSIS — M6281 Muscle weakness (generalized): Secondary | ICD-10-CM | POA: Diagnosis not present

## 2019-12-20 DIAGNOSIS — M25561 Pain in right knee: Secondary | ICD-10-CM | POA: Diagnosis not present

## 2019-12-20 NOTE — Patient Instructions (Signed)
  Half Squat to Chair   Stand with feet shoulder width apart. Push buttocks backward and lower slowly, sitting in chair lightly and returning to standing position. Complete _2_ sets of 10_ repetitions. Perform __2-3_ sessions per day.     Lower Body: Toe Rise   Standing, place feet apart. Hold arms out for balance or use support. Rise up on toes. Hold _3___ seconds, then lower. Repeat immediately. Repeat __10__ times. Do __2__ sessions per day.    Strengthening: Hip Abduction (Side-Lying)  Strengthening: Straight Leg Raise (Phase 1)  Repeat _10___ times per set. Do __2__ sets per session. Do __2__ sessions per day.   Bridging   Slowly raise buttocks from floor, keeping stomach tight. Repeat _10___ times per set. Do __2__ sets per session. Do __2__ sessions per day.   Straight Leg Raise   Tighten stomach and slowly raise locked right leg __4__ inches from floor. Repeat __10-30__ times per set. Do __2__ sets per session. Do __2__ sessions per day.

## 2019-12-20 NOTE — Therapy (Signed)
[facility] [place], [place], [address] Phone: [phone]   Fax:  [phone]  Physical Therapy Treatment  Patient Details  Name: Gregory Pearson MRN: [id number] Date of Birth: 1928/03/30 Referring Provider (PT): Gaynelle Arabian MD   Encounter Date: 12/20/2019  PT End of Session - 12/20/19 1017    Visit Number  3    Number of Visits  8    Date for PT Re-Evaluation  01/10/20    PT Start Time  0945    PT Stop Time  1028    PT Time Calculation (min)  43 min    Activity Tolerance  Patient tolerated treatment well    Behavior During Therapy  [facility] for tasks assessed/performed       Past Medical History:  Diagnosis Date  . Asbestos exposure   . Blood transfusion without reported diagnosis    after ruptured appendix 1963  . Diverticulosis   . Gastric ulcer 06/2019  . GERD (gastroesophageal reflux disease)   . History of stomach ulcers 1995   H.pylori s/p treatment  . History of stomach ulcers   . Hyperlipidemia   . Muscle strain of right gluteal region 01/24/2014  . Prostate cancer [facility]) 2002   prostatectomy  . Traumatic hematoma of buttock 01/24/2014    Past Surgical History:  Procedure Laterality Date  . APPENDECTOMY  1963   ruptured appendix/gangrene  . BIOPSY  07/06/2019   Procedure: BIOPSY;  Surgeon: Daneil Dolin, MD;  [facility];  Service: Endoscopy;;  . COLONOSCOPY  2013   Dr. Fuller Plan: multiple colon polyps (tubular adenomas), diverticulosis. no further surveillance colonoscopies due to age.  . ESOPHAGOGASTRODUODENOSCOPY  [id number]   Dr. Fuller Plan: gastric ulcer (h.pylori + s/p tx), follow up EGD verified ulcer healing  . ESOPHAGOGASTRODUODENOSCOPY N/A 09/26/2016   Procedure: ESOPHAGOGASTRODUODENOSCOPY (EGD);  Surgeon: Daneil Dolin, MD;  [facility];  Service: Endoscopy;  Laterality: N/A;  . ESOPHAGOGASTRODUODENOSCOPY N/A 01/23/2017   Procedure: ESOPHAGOGASTRODUODENOSCOPY (EGD);  Surgeon:  Daneil Dolin, MD;  [facility];  Service: Endoscopy;  Laterality: N/A;  12:15pm  . ESOPHAGOGASTRODUODENOSCOPY N/A 07/06/2019   Procedure: ESOPHAGOGASTRODUODENOSCOPY (EGD);  Surgeon: Daneil Dolin, MD; normal esophagus, nonbleeding gastric ulcer with pigmented material, abnormal gastric mucosa of uncertain significance s/p biopsy, normal duodenum.  Pathology with mild reactive gastropathy, mild chronic gastritis, H. pylori negative.  . ESOPHAGOGASTRODUODENOSCOPY N/A 10/20/2019   Procedure: ESOPHAGOGASTRODUODENOSCOPY (EGD);  Surgeon: Daneil Dolin, MD;  [facility];  Service: Endoscopy;  Laterality: N/A;  9:30am  . [place]  . LAPAROSCOPIC CHOLECYSTECTOMY  2003  . PROSTATECTOMY  2002    There were no vitals filed for this visit.  Subjective Assessment - 12/20/19 0945    Subjective  COVID-19 screen performed prior to patient entering clinic.  Patient arrived with no complaints and did well after last treatment    Pertinent History  Right gluteal strain, GERD, prostatectomy.    How long can you walk comfortably?  Short community distances.    Diagnostic tests  X-rays.    Currently in Pain?  No/denies                       [facility] Adult PT Treatment/Exercise - 12/20/19 0001      Knee/Hip Exercises: Aerobic   Nustep  L5 x 15 minutes      Knee/Hip Exercises: Standing   Heel Raises  Both;20 reps    Forward  Step Up  Both;2 sets;10 reps;Step Height: 6";Hand Hold: 1      Knee/Hip Exercises: Seated   Long Arc Quad  Strengthening;Both;3 sets;10 reps;Weights    Long Arc Quad Weight  4 lbs.    Sit to Sand  2 sets;10 reps;without UE support   elevated mat table     Knee/Hip Exercises: Supine   Bridges  Strengthening;Both;2 sets;10 reps    Straight Leg Raises  Strengthening;Both;2 sets;10 reps    Other Supine Knee/Hip Exercises  clam with red t-band x20      Knee/Hip Exercises: Sidelying   Hip ABduction  Strengthening;Both;2 sets;10  reps             PT Education - 12/20/19 1003    Education Details  HEP progression    Person(s) Educated  Patient    Methods  Explanation;Demonstration;Handout    Comprehension  Verbalized understanding;Returned demonstration          PT Long Term Goals - 12/20/19 1022      PT LONG TERM GOAL #1   Title  Independent with a HEP.    Baseline  Met 12/20/19    Time  4    Period  Weeks    Status  Achieved      PT LONG TERM GOAL #2   Title  Sit to stand x 5 with minimal use of armrests and no use of momentum.    Time  4    Period  Weeks    Status  On-going      PT LONG TERM GOAL #3   Title  Right hip strength= 5/5.    Time  4    Period  Weeks    Status  On-going      PT LONG TERM GOAL #4   Title  Perform a reciprocating stair gait with one railing.    Time  4    Period  Weeks    Status  On-going   some difficulty today 12/20/19           Plan - 12/20/19 1018    Clinical Impression Statement  Patient tolerated treatment well today. Today focused on HEP progression for home to then progress patient with exercises he can do here he cant perform at home. Patient independent with exercises today. Patient has some difficulty with stairs and sit to stands. Patient met LTG #1 with other goals progressing. Patient will need to progress exercises and he will join gym after therapy.    Personal Factors and Comorbidities  Comorbidity 1;Comorbidity 2    Comorbidities  Right gluteal strain, GERD, prostatectomy.    Examination-Activity Limitations  Transfers;Stairs    Stability/Clinical Decision Making  Evolving/Moderate complexity    Rehab Potential  Good    PT Frequency  2x / week    PT Duration  4 weeks    PT Treatment/Interventions  Cryotherapy;Electrical Stimulation;Moist Heat;Gait training;Stair training;Functional mobility training;Therapeutic activities;Therapeutic exercise;Manual techniques;Patient/family education;Passive range of motion;Joint Manipulations    PT  Next Visit Plan  cont with Patellar mobs, pain-free bilateral LE ther ex. (progress with exercises/consider machines)    Consulted and Agree with Plan of Care  Patient       Patient will benefit from skilled therapeutic intervention in order to improve the following deficits and impairments:  Decreased activity tolerance, Decreased range of motion, Pain, Decreased strength  Visit Diagnosis: Chronic pain of right knee  Chronic pain of left knee  Muscle weakness (generalized)     Problem List Patient  Active Problem List   Diagnosis Date Noted  . Gastric ulcer 08/11/2019  . History of gastric ulcer 08/11/2019  . GI bleed 07/06/2019  . History of GI bleed   . Melena   . Hyperglycemia 09/25/2016  . Venous stasis 05/17/2016  . Hyperlipidemia 07/11/2015  . Thrombocytopenia ([place]) 01/24/2014    Karin Pinedo P, PTA 12/20/2019, 10:29 AM  [facility] [place], [place], [address] Phone: [phone]   Fax:  [phone]  Name: Gregory Pearson MRN: [id number] Date of Birth: June 30, 1928

## 2019-12-24 ENCOUNTER — Ambulatory Visit: Payer: Medicare Other | Admitting: Physical Therapy

## 2019-12-24 ENCOUNTER — Encounter: Payer: Self-pay | Admitting: Physical Therapy

## 2019-12-24 ENCOUNTER — Other Ambulatory Visit: Payer: Self-pay

## 2019-12-24 DIAGNOSIS — G8929 Other chronic pain: Secondary | ICD-10-CM

## 2019-12-24 DIAGNOSIS — M25561 Pain in right knee: Secondary | ICD-10-CM

## 2019-12-24 DIAGNOSIS — M25562 Pain in left knee: Secondary | ICD-10-CM | POA: Diagnosis not present

## 2019-12-24 DIAGNOSIS — M6281 Muscle weakness (generalized): Secondary | ICD-10-CM | POA: Diagnosis not present

## 2019-12-24 NOTE — Therapy (Signed)
[facility] [place], [place], [address] Phone: [phone]   Fax:  [phone]  Physical Therapy Treatment  Patient Details  Name: Gregory Pearson MRN: [id number] Date of Birth: [birth year] Referring Provider (PT): Gaynelle Arabian MD   Encounter Date: 12/24/2019  PT End of Session - 12/24/19 1011    Visit Number  4    Number of Visits  8    Date for PT Re-Evaluation  01/10/20    PT Start Time  0945    PT Stop Time  1030    PT Time Calculation (min)  45 min    Activity Tolerance  Patient tolerated treatment well    Behavior During Therapy  [facility] for tasks assessed/performed       Past Medical History:  Diagnosis Date  . Asbestos exposure   . Blood transfusion without reported diagnosis    after ruptured appendix 1963  . Diverticulosis   . Gastric ulcer 06/2019  . GERD (gastroesophageal reflux disease)   . History of stomach ulcers 1995   H.pylori s/p treatment  . History of stomach ulcers   . Hyperlipidemia   . Muscle strain of right gluteal region 01/24/2014  . Prostate cancer [facility]) 2002   prostatectomy  . Traumatic hematoma of buttock 01/24/2014    Past Surgical History:  Procedure Laterality Date  . APPENDECTOMY  1963   ruptured appendix/gangrene  . BIOPSY  07/06/2019   Procedure: BIOPSY;  Surgeon: Daneil Dolin, MD;  [facility];  Service: Endoscopy;;  . COLONOSCOPY  2013   Dr. Fuller Plan: multiple colon polyps (tubular adenomas), diverticulosis. no further surveillance colonoscopies due to age.  . ESOPHAGOGASTRODUODENOSCOPY  [id number]   Dr. Fuller Plan: gastric ulcer (h.pylori + s/p tx), follow up EGD verified ulcer healing  . ESOPHAGOGASTRODUODENOSCOPY N/A 09/26/2016   Procedure: ESOPHAGOGASTRODUODENOSCOPY (EGD);  Surgeon: Daneil Dolin, MD;  [facility];  Service: Endoscopy;  Laterality: N/A;  . ESOPHAGOGASTRODUODENOSCOPY N/A 01/23/2017   Procedure: ESOPHAGOGASTRODUODENOSCOPY (EGD);  Surgeon:  Daneil Dolin, MD;  [facility];  Service: Endoscopy;  Laterality: N/A;  12:15pm  . ESOPHAGOGASTRODUODENOSCOPY N/A 07/06/2019   Procedure: ESOPHAGOGASTRODUODENOSCOPY (EGD);  Surgeon: Daneil Dolin, MD; normal esophagus, nonbleeding gastric ulcer with pigmented material, abnormal gastric mucosa of uncertain significance s/p biopsy, normal duodenum.  Pathology with mild reactive gastropathy, mild chronic gastritis, H. pylori negative.  . ESOPHAGOGASTRODUODENOSCOPY N/A 10/20/2019   Procedure: ESOPHAGOGASTRODUODENOSCOPY (EGD);  Surgeon: Daneil Dolin, MD;  [facility];  Service: Endoscopy;  Laterality: N/A;  9:30am  . [place]  . LAPAROSCOPIC CHOLECYSTECTOMY  2003  . PROSTATECTOMY  2002    There were no vitals filed for this visit.  Subjective Assessment - 12/24/19 1011    Subjective  COVID-19 screen performed prior to patient entering clinic.  Patient arrived with no pain in her knees.    Pertinent History  Right gluteal strain, GERD, prostatectomy.    How long can you walk comfortably?  Short community distances.    Diagnostic tests  X-rays.    Patient Stated Goals  See above.    Currently in Pain?  No/denies                       [facility] Adult PT Treatment/Exercise - 12/24/19 0001      Knee/Hip Exercises: Aerobic   Nustep  L5 x 15 minutes      Knee/Hip Exercises: Standing   Heel Raises  Both;20 reps    Hip Flexion  Stengthening;15 reps    Hip Abduction  Stengthening;Both;15 reps    Lateral Step Up  Both;10 reps;Hand Hold: 2    Forward Step Up  Both;2 sets;10 reps;Step Height: 6";Hand Hold: 1      Knee/Hip Exercises: Seated   Long Arc Quad  Strengthening;Both;3 sets;10 reps;Weights    Long Arc Quad Weight  4 lbs.    Sit to Sand  2 sets;10 reps;without UE support   elevated mat table     Knee/Hip Exercises: Supine   Bridges  Strengthening;Both;2 sets;10 reps    Straight Leg Raises  Strengthening;Both;2 sets;10 reps     Other Supine Knee/Hip Exercises  clam with green theraband                  PT Long Term Goals - 12/24/19 1019      PT LONG TERM GOAL #1   Title  Independent with a HEP.    Baseline  Met 12/20/19    Time  4    Period  Weeks    Status  Achieved      PT LONG TERM GOAL #2   Title  Sit to stand x 5 with minimal use of armrests and no use of momentum.    Time  4    Period  Weeks    Status  On-going      PT LONG TERM GOAL #3   Title  Right hip strength= 5/5.    Time  4    Period  Weeks    Status  On-going      PT LONG TERM GOAL #4   Title  Perform a reciprocating stair gait with one railing.    Time  4    Period  Weeks            Plan - 12/24/19 1014    Clinical Impression Statement  Pt tolerating treatment well. Pt focusing on LE strengthening and functional mobility. Continue to progress toward LTG's. Pt is progresing toward joining a gym after completing therapy.    Personal Factors and Comorbidities  Comorbidity 1;Comorbidity 2    Comorbidities  Right gluteal strain, GERD, prostatectomy.    Examination-Activity Limitations  Transfers;Stairs    Stability/Clinical Decision Making  Evolving/Moderate complexity    Rehab Potential  Good    PT Frequency  2x / week    PT Duration  4 weeks    PT Treatment/Interventions  Cryotherapy;Electrical Stimulation;Moist Heat;Gait training;Stair training;Functional mobility training;Therapeutic activities;Therapeutic exercise;Manual techniques;Patient/family education;Passive range of motion;Joint Manipulations    PT Next Visit Plan  cont with Patellar mobs, pain-free bilateral LE ther ex. Consider bike next visit and adding [employer] machines    PT Home Exercise Plan  standing 4 way hip, LAQ, sit to stand without UE suport    Consulted and Agree with Plan of Care  Patient       Patient will benefit from skilled therapeutic intervention in order to improve the following deficits and impairments:  Decreased activity tolerance,  Decreased range of motion, Pain, Decreased strength  Visit Diagnosis: Chronic pain of right knee  Chronic pain of left knee  Muscle weakness (generalized)     Problem List Patient Active Problem List   Diagnosis Date Noted  . Gastric ulcer 08/11/2019  . History of gastric ulcer 08/11/2019  . GI bleed 07/06/2019  . History of GI bleed   . Melena   . Hyperglycemia 09/25/2016  . Venous stasis 05/17/2016  .  Hyperlipidemia 07/11/2015  . Thrombocytopenia ([place]) 01/24/2014    Oretha Caprice, PT, MPT  12/24/2019, 10:24 AM  [facility] [address] [place], [place], [address] Phone: [phone]   Fax:  [phone]  Name: Gregory Pearson MRN: [id number] Date of Birth: 10/14/1927

## 2019-12-27 ENCOUNTER — Other Ambulatory Visit: Payer: Self-pay

## 2019-12-27 ENCOUNTER — Ambulatory Visit: Payer: Medicare Other | Attending: Orthopedic Surgery | Admitting: Physical Therapy

## 2019-12-27 ENCOUNTER — Encounter: Payer: Self-pay | Admitting: Physical Therapy

## 2019-12-27 DIAGNOSIS — M25562 Pain in left knee: Secondary | ICD-10-CM | POA: Insufficient documentation

## 2019-12-27 DIAGNOSIS — M25561 Pain in right knee: Secondary | ICD-10-CM | POA: Insufficient documentation

## 2019-12-27 DIAGNOSIS — G8929 Other chronic pain: Secondary | ICD-10-CM | POA: Diagnosis not present

## 2019-12-27 DIAGNOSIS — M6281 Muscle weakness (generalized): Secondary | ICD-10-CM | POA: Insufficient documentation

## 2019-12-27 NOTE — Therapy (Signed)
[facility] [place], [place], [address] Phone: [phone]   Fax:  [phone]  Physical Therapy Treatment  Patient Details  Name: Francois Elk MRN: [id number] Date of Birth: [birth year] Referring Provider (PT): Gaynelle Arabian MD   Encounter Date: 12/27/2019  PT End of Session - 12/27/19 1314    Visit Number  5    Number of Visits  8    Date for PT Re-Evaluation  01/10/20    PT Start Time  1300    Activity Tolerance  Patient tolerated treatment well    Behavior During Therapy  [facility] for tasks assessed/performed       Past Medical History:  Diagnosis Date  . Asbestos exposure   . Blood transfusion without reported diagnosis    after ruptured appendix 1963  . Diverticulosis   . Gastric ulcer 06/2019  . GERD (gastroesophageal reflux disease)   . History of stomach ulcers 1995   H.pylori s/p treatment  . History of stomach ulcers   . Hyperlipidemia   . Muscle strain of right gluteal region 01/24/2014  . Prostate cancer [facility]) 2002   prostatectomy  . Traumatic hematoma of buttock 01/24/2014    Past Surgical History:  Procedure Laterality Date  . APPENDECTOMY  1963   ruptured appendix/gangrene  . BIOPSY  07/06/2019   Procedure: BIOPSY;  Surgeon: Daneil Dolin, MD;  [facility];  Service: Endoscopy;;  . COLONOSCOPY  2013   Dr. Fuller Plan: multiple colon polyps (tubular adenomas), diverticulosis. no further surveillance colonoscopies due to age.  . ESOPHAGOGASTRODUODENOSCOPY  2836,6294   Dr. Fuller Plan: gastric ulcer (h.pylori + s/p tx), follow up EGD verified ulcer healing  . ESOPHAGOGASTRODUODENOSCOPY N/A 09/26/2016   Procedure: ESOPHAGOGASTRODUODENOSCOPY (EGD);  Surgeon: Daneil Dolin, MD;  [facility];  Service: Endoscopy;  Laterality: N/A;  . ESOPHAGOGASTRODUODENOSCOPY N/A 01/23/2017   Procedure: ESOPHAGOGASTRODUODENOSCOPY (EGD);  Surgeon: Daneil Dolin, MD;  [facility];  Service: Endoscopy;   Laterality: N/A;  12:15pm  . ESOPHAGOGASTRODUODENOSCOPY N/A 07/06/2019   Procedure: ESOPHAGOGASTRODUODENOSCOPY (EGD);  Surgeon: Daneil Dolin, MD; normal esophagus, nonbleeding gastric ulcer with pigmented material, abnormal gastric mucosa of uncertain significance s/p biopsy, normal duodenum.  Pathology with mild reactive gastropathy, mild chronic gastritis, H. pylori negative.  . ESOPHAGOGASTRODUODENOSCOPY N/A 10/20/2019   Procedure: ESOPHAGOGASTRODUODENOSCOPY (EGD);  Surgeon: Daneil Dolin, MD;  [facility];  Service: Endoscopy;  Laterality: N/A;  9:30am  . [place]  . LAPAROSCOPIC CHOLECYSTECTOMY  2003  . PROSTATECTOMY  2002    There were no vitals filed for this visit.  Subjective Assessment - 12/27/19 1314    Subjective  COVID-19 screen performed prior to patient entering clinic.  Patient arrives feeling good.    Pertinent History  Right gluteal strain, GERD, prostatectomy.    How long can you walk comfortably?  Short community distances.    Diagnostic tests  X-rays.    Patient Stated Goals  See above.    Currently in Pain?  No/denies                       [facility] Adult PT Treatment/Exercise - 12/27/19 0001      Knee/Hip Exercises: Aerobic   Nustep  L5 x 15 minutes      Knee/Hip Exercises: Standing   Heel Raises  Both;3 sets;10 reps    Hip Abduction  Stengthening;3 sets;10 reps    Forward Step Up  Both;2 sets;10 reps;Step  Height: 6";Hand Hold: 1;Hand Hold: 2   B UE s     Knee/Hip Exercises: Seated   Long Arc Quad  Strengthening;Both;3 sets;10 reps;Weights    Long Arc Quad Weight  4 lbs.    Marching  Strengthening;Both;3 sets;10 reps;Weights    Marching Weights  4 lbs.    Hamstring Curl  Strengthening;Both;3 sets;10 reps    Hamstring Limitations  green theraband    Sit to Sand  2 sets;10 reps;without UE support   elevated surface                 PT Long Term Goals - 12/24/19 1019      PT LONG TERM GOAL #1    Title  Independent with a HEP.    Baseline  Met 12/20/19    Time  4    Period  Weeks    Status  Achieved      PT LONG TERM GOAL #2   Title  Sit to stand x 5 with minimal use of armrests and no use of momentum.    Time  4    Period  Weeks    Status  On-going      PT LONG TERM GOAL #3   Title  Right hip strength= 5/5.    Time  4    Period  Weeks    Status  On-going      PT LONG TERM GOAL #4   Title  Perform a reciprocating stair gait with one railing.    Time  4    Period  Weeks            Plan - 12/27/19 1326    Clinical Impression Statement  Patient responded well to therapy session with emphasis on strengthening of LEs. Patient demonstrated excellent form with all TEs after explanation. Patient still feels his right knee may give way but is compliant with use of hurry cane for safety.    Personal Factors and Comorbidities  Comorbidity 1;Comorbidity 2    Comorbidities  Right gluteal strain, GERD, prostatectomy.    Examination-Activity Limitations  Transfers;Stairs    Stability/Clinical Decision Making  Evolving/Moderate complexity    Clinical Decision Making  Low    Rehab Potential  Good    PT Frequency  2x / week    PT Duration  4 weeks    PT Treatment/Interventions  Cryotherapy;Electrical Stimulation;Moist Heat;Gait training;Stair training;Functional mobility training;Therapeutic activities;Therapeutic exercise;Manual techniques;Patient/family education;Passive range of motion;Joint Manipulations    PT Next Visit Plan  cont with Patellar mobs, pain-free bilateral LE ther ex. Consider bike next visit and adding Cybex machines    PT Home Exercise Plan  standing 4 way hip, LAQ, sit to stand without UE suport    Consulted and Agree with Plan of Care  Patient       Patient will benefit from skilled therapeutic intervention in order to improve the following deficits and impairments:  Decreased activity tolerance, Decreased range of motion, Pain, Decreased strength  Visit  Diagnosis: Chronic pain of left knee  Chronic pain of right knee  Muscle weakness (generalized)     Problem List Patient Active Problem List   Diagnosis Date Noted  . Gastric ulcer 08/11/2019  . History of gastric ulcer 08/11/2019  . GI bleed 07/06/2019  . History of GI bleed   . Melena   . Hyperglycemia 09/25/2016  . Venous stasis 05/17/2016  . Hyperlipidemia 07/11/2015  . Thrombocytopenia ([place]) 01/24/2014    Gabriela Eves, PT, DPT 12/27/2019, 2:51  PM  [facility] [place], [place], [address] Phone: [phone]   Fax:  [phone]  Name: Eulogio Requena MRN: [id number] Date of Birth: 23-May-1928

## 2019-12-31 ENCOUNTER — Other Ambulatory Visit: Payer: Self-pay

## 2019-12-31 ENCOUNTER — Ambulatory Visit: Payer: Medicare Other | Admitting: *Deleted

## 2019-12-31 DIAGNOSIS — M6281 Muscle weakness (generalized): Secondary | ICD-10-CM

## 2019-12-31 DIAGNOSIS — M25561 Pain in right knee: Secondary | ICD-10-CM

## 2019-12-31 DIAGNOSIS — G8929 Other chronic pain: Secondary | ICD-10-CM

## 2019-12-31 DIAGNOSIS — M25562 Pain in left knee: Secondary | ICD-10-CM | POA: Diagnosis not present

## 2019-12-31 NOTE — Therapy (Signed)
[facility] [place], [place], [address] Phone: [phone]   Fax:  [phone]  Physical Therapy Treatment  Patient Details  Name: Gregory Pearson MRN: [id number] Date of Birth: 25-Jan-1928 Referring Provider (PT): Gaynelle Arabian MD   Encounter Date: 12/31/2019  PT End of Session - 12/31/19 1030    Visit Number  6    Number of Visits  8    Date for PT Re-Evaluation  01/10/20    PT Start Time  0945    PT Stop Time  1030    PT Time Calculation (min)  45 min       Past Medical History:  Diagnosis Date  . Asbestos exposure   . Blood transfusion without reported diagnosis    after ruptured appendix 1963  . Diverticulosis   . Gastric ulcer 06/2019  . GERD (gastroesophageal reflux disease)   . History of stomach ulcers 1995   H.pylori s/p treatment  . History of stomach ulcers   . Hyperlipidemia   . Muscle strain of right gluteal region 01/24/2014  . Prostate cancer [facility]) 2002   prostatectomy  . Traumatic hematoma of buttock 01/24/2014    Past Surgical History:  Procedure Laterality Date  . APPENDECTOMY  1963   ruptured appendix/gangrene  . BIOPSY  07/06/2019   Procedure: BIOPSY;  Surgeon: Daneil Dolin, MD;  [facility];  Service: Endoscopy;;  . COLONOSCOPY  2013   Dr. Fuller Plan: multiple colon polyps (tubular adenomas), diverticulosis. no further surveillance colonoscopies due to age.  . ESOPHAGOGASTRODUODENOSCOPY  [id number]   Dr. Fuller Plan: gastric ulcer (h.pylori + s/p tx), follow up EGD verified ulcer healing  . ESOPHAGOGASTRODUODENOSCOPY N/A 09/26/2016   Procedure: ESOPHAGOGASTRODUODENOSCOPY (EGD);  Surgeon: Daneil Dolin, MD;  [facility];  Service: Endoscopy;  Laterality: N/A;  . ESOPHAGOGASTRODUODENOSCOPY N/A 01/23/2017   Procedure: ESOPHAGOGASTRODUODENOSCOPY (EGD);  Surgeon: Daneil Dolin, MD;  [facility];  Service: Endoscopy;  Laterality: N/A;  12:15pm  . ESOPHAGOGASTRODUODENOSCOPY  N/A 07/06/2019   Procedure: ESOPHAGOGASTRODUODENOSCOPY (EGD);  Surgeon: Daneil Dolin, MD; normal esophagus, nonbleeding gastric ulcer with pigmented material, abnormal gastric mucosa of uncertain significance s/p biopsy, normal duodenum.  Pathology with mild reactive gastropathy, mild chronic gastritis, H. pylori negative.  . ESOPHAGOGASTRODUODENOSCOPY N/A 10/20/2019   Procedure: ESOPHAGOGASTRODUODENOSCOPY (EGD);  Surgeon: Daneil Dolin, MD;  [facility];  Service: Endoscopy;  Laterality: N/A;  9:30am  . [place]  . LAPAROSCOPIC CHOLECYSTECTOMY  2003  . PROSTATECTOMY  2002    There were no vitals filed for this visit.  Subjective Assessment - 12/31/19 1001    Subjective  COVID-19 screen performed prior to patient entering clinic.  Patient arrives feeling good. RT knee is weaker than the RT    Pertinent History  Right gluteal strain, GERD, prostatectomy.    How long can you walk comfortably?  Short community distances.    Diagnostic tests  X-rays.    Patient Stated Goals  See above.    Currently in Pain?  No/denies                       [facility] Adult PT Treatment/Exercise - 12/31/19 0001      Knee/Hip Exercises: Aerobic   Nustep  L5 x 15 minutes      Knee/Hip Exercises: Standing   Heel Raises  --    Hip Flexion  Both;AROM;2 sets;10 reps    Hip Abduction  Stengthening;10 reps;Both;2  sets    Lateral Step Up  Both;10 reps;Hand Hold: 2    Forward Step Up  Both;2 sets;10 reps;Step Height: 6";Hand Hold: 1;Hand Hold: 2    Rocker Board  4 minutes   PF/ DF and calf stretching     Knee/Hip Exercises: Seated   Long Arc Quad  Strengthening;Both;3 sets;10 reps;Weights    Long Arc Quad Weight  4 lbs.    Marching  --    Sit to [employer]  2 sets;10 reps;without UE support   1 set at 22in ht,  and x10 at 20 in ht mat table                 PT Long Term Goals - 12/24/19 1019      PT LONG TERM GOAL #1   Title  Independent with a HEP.     Baseline  Met 12/20/19    Time  4    Period  Weeks    Status  Achieved      PT LONG TERM GOAL #2   Title  Sit to stand x 5 with minimal use of armrests and no use of momentum.    Time  4    Period  Weeks    Status  On-going      PT LONG TERM GOAL #3   Title  Right hip strength= 5/5.    Time  4    Period  Weeks    Status  On-going      PT LONG TERM GOAL #4   Title  Perform a reciprocating stair gait with one railing.    Time  4    Period  Weeks              Patient will benefit from skilled therapeutic intervention in order to improve the following deficits and impairments:     Visit Diagnosis: Chronic pain of left knee  Chronic pain of right knee  Muscle weakness (generalized)     Problem List Patient Active Problem List   Diagnosis Date Noted  . Gastric ulcer 08/11/2019  . History of gastric ulcer 08/11/2019  . GI bleed 07/06/2019  . History of GI bleed   . Melena   . Hyperglycemia 09/25/2016  . Venous stasis 05/17/2016  . Hyperlipidemia 07/11/2015  . Thrombocytopenia ([place]) 01/24/2014    Rachael Zapanta,CHRIS, PTA 12/31/2019, 12:36 PM  [facility] [place], [place], [address] Phone: [phone]   Fax:  [phone]  Name: Clell Trahan MRN: [id number] Date of Birth: Nov 27, 1927

## 2020-01-03 ENCOUNTER — Other Ambulatory Visit: Payer: Self-pay

## 2020-01-03 ENCOUNTER — Ambulatory Visit: Payer: Medicare Other | Admitting: Physical Therapy

## 2020-01-03 ENCOUNTER — Encounter: Payer: Self-pay | Admitting: Physical Therapy

## 2020-01-03 DIAGNOSIS — M25562 Pain in left knee: Secondary | ICD-10-CM

## 2020-01-03 DIAGNOSIS — G8929 Other chronic pain: Secondary | ICD-10-CM | POA: Diagnosis not present

## 2020-01-03 DIAGNOSIS — M25561 Pain in right knee: Secondary | ICD-10-CM | POA: Diagnosis not present

## 2020-01-03 DIAGNOSIS — M6281 Muscle weakness (generalized): Secondary | ICD-10-CM | POA: Diagnosis not present

## 2020-01-03 NOTE — Therapy (Signed)
[facility] [place], [place], [address] Phone: [phone]   Fax:  [phone]  Physical Therapy Treatment  Patient Details  Name: Gregory Pearson MRN: [id number] Date of Birth: [birth year] Referring Provider (PT): Gaynelle Arabian MD   Encounter Date: 01/03/2020  PT End of Session - 01/03/20 0959    Visit Number  7    Number of Visits  8    Date for PT Re-Evaluation  01/10/20    PT Start Time  0947    PT Stop Time  1030    PT Time Calculation (min)  43 min    Activity Tolerance  Patient tolerated treatment well    Behavior During Therapy  [facility] for tasks assessed/performed       Past Medical History:  Diagnosis Date  . Asbestos exposure   . Blood transfusion without reported diagnosis    after ruptured appendix 1963  . Diverticulosis   . Gastric ulcer 06/2019  . GERD (gastroesophageal reflux disease)   . History of stomach ulcers 1995   H.pylori s/p treatment  . History of stomach ulcers   . Hyperlipidemia   . Muscle strain of right gluteal region 01/24/2014  . Prostate cancer [facility]) 2002   prostatectomy  . Traumatic hematoma of buttock 01/24/2014    Past Surgical History:  Procedure Laterality Date  . APPENDECTOMY  1963   ruptured appendix/gangrene  . BIOPSY  07/06/2019   Procedure: BIOPSY;  Surgeon: Daneil Dolin, MD;  [facility];  Service: Endoscopy;;  . COLONOSCOPY  2013   Dr. Fuller Plan: multiple colon polyps (tubular adenomas), diverticulosis. no further surveillance colonoscopies due to age.  . ESOPHAGOGASTRODUODENOSCOPY  [id number]   Dr. Fuller Plan: gastric ulcer (h.pylori + s/p tx), follow up EGD verified ulcer healing  . ESOPHAGOGASTRODUODENOSCOPY N/A 09/26/2016   Procedure: ESOPHAGOGASTRODUODENOSCOPY (EGD);  Surgeon: Daneil Dolin, MD;  [facility];  Service: Endoscopy;  Laterality: N/A;  . ESOPHAGOGASTRODUODENOSCOPY N/A 01/23/2017   Procedure: ESOPHAGOGASTRODUODENOSCOPY (EGD);  Surgeon:  Daneil Dolin, MD;  [facility];  Service: Endoscopy;  Laterality: N/A;  12:15pm  . ESOPHAGOGASTRODUODENOSCOPY N/A 07/06/2019   Procedure: ESOPHAGOGASTRODUODENOSCOPY (EGD);  Surgeon: Daneil Dolin, MD; normal esophagus, nonbleeding gastric ulcer with pigmented material, abnormal gastric mucosa of uncertain significance s/p biopsy, normal duodenum.  Pathology with mild reactive gastropathy, mild chronic gastritis, H. pylori negative.  . ESOPHAGOGASTRODUODENOSCOPY N/A 10/20/2019   Procedure: ESOPHAGOGASTRODUODENOSCOPY (EGD);  Surgeon: Daneil Dolin, MD;  [facility];  Service: Endoscopy;  Laterality: N/A;  9:30am  . [place]  . LAPAROSCOPIC CHOLECYSTECTOMY  2003  . PROSTATECTOMY  2002    There were no vitals filed for this visit.  Subjective Assessment - 01/03/20 0959    Subjective  COVID-19 screen performed prior to patient entering clinic.  Patient arrives feeling good. RT knee is weaker than the RT    Pertinent History  Right gluteal strain, GERD, prostatectomy.    How long can you walk comfortably?  Short community distances.    Diagnostic tests  X-rays.    Patient Stated Goals  See above.    Currently in Pain?  No/denies         [facility] PT Assessment - 01/03/20 0001      Assessment   Medical Diagnosis  Bilateral knee pain.    Referring Provider (PT)  Gaynelle Arabian MD    Next MD Visit  PRN      Precautions   Precaution  Comments  PAIN-FREE THER EX.      Restrictions   Weight Bearing Restrictions  No                   [facility] Adult PT Treatment/Exercise - 01/03/20 0001      Knee/Hip Exercises: Aerobic   Nustep  L5 x 15 minutes      Knee/Hip Exercises: Standing   Hip Abduction  AROM;Both;20 reps;Knee straight    Forward Step Up  Both;2 sets;10 reps;Step Height: 6";Hand Hold: 2      Knee/Hip Exercises: Seated   Long Arc Quad  Strengthening;Both;3 sets;10 reps;Weights    Long Arc Quad Weight  4 lbs.    Clamshell with  TheraBand  Red   x20 reps   Hamstring Curl  Strengthening;Both;20 reps;Limitations    Hamstring Limitations  red theraband    Sit to Sand  20 reps;without UE support                  PT Long Term Goals - 12/24/19 1019      PT LONG TERM GOAL #1   Title  Independent with a HEP.    Baseline  Met 12/20/19    Time  4    Period  Weeks    Status  Achieved      PT LONG TERM GOAL #2   Title  Sit to stand x 5 with minimal use of armrests and no use of momentum.    Time  4    Period  Weeks    Status  On-going      PT LONG TERM GOAL #3   Title  Right hip strength= 5/5.    Time  4    Period  Weeks    Status  On-going      PT LONG TERM GOAL #4   Title  Perform a reciprocating stair gait with one railing.    Time  4    Period  Weeks            Plan - 01/03/20 1039    Clinical Impression Statement  Patient presented in clinic with no reports of B knee pain. Patient also reports restarting his gym membership for when he stops PT. Patient guided through resisted knee and hip strengthening in clinic without complaint of pain. Patient independent and able to stabilize independently with sit/stand without UE support.    Personal Factors and Comorbidities  Comorbidity 1;Comorbidity 2    Comorbidities  Right gluteal strain, GERD, prostatectomy.    Examination-Activity Limitations  Transfers;Stairs    Stability/Clinical Decision Making  Evolving/Moderate complexity    Rehab Potential  Good    PT Frequency  2x / week    PT Duration  4 weeks    PT Treatment/Interventions  Cryotherapy;Electrical Stimulation;Moist Heat;Gait training;Stair training;Functional mobility training;Therapeutic activities;Therapeutic exercise;Manual techniques;Patient/family education;Passive range of motion;Joint Manipulations    PT Next Visit Plan  cont with Patellar mobs, pain-free bilateral LE ther ex. Consider bike next visit and adding Cybex machines    PT Home Exercise Plan  standing 4 way hip, LAQ,  sit to stand without UE suport    Consulted and Agree with Plan of Care  Patient       Patient will benefit from skilled therapeutic intervention in order to improve the following deficits and impairments:  Decreased activity tolerance, Decreased range of motion, Pain, Decreased strength  Visit Diagnosis: Chronic pain of left knee  Chronic pain of right knee  Muscle weakness (generalized)  Problem List Patient Active Problem List   Diagnosis Date Noted  . Gastric ulcer 08/11/2019  . History of gastric ulcer 08/11/2019  . GI bleed 07/06/2019  . History of GI bleed   . Melena   . Hyperglycemia 09/25/2016  . Venous stasis 05/17/2016  . Hyperlipidemia 07/11/2015  . Thrombocytopenia ([place]) 01/24/2014    Standley Brooking, PTA 01/03/2020, 10:43 AM  [facility] [address] [place], [place], [address] Phone: [phone]   Fax:  [phone]  Name: Gregory Pearson MRN: [id number] Date of Birth: 04/23/1928

## 2020-01-07 ENCOUNTER — Encounter: Payer: Self-pay | Admitting: *Deleted

## 2020-01-07 ENCOUNTER — Other Ambulatory Visit: Payer: Self-pay

## 2020-01-07 ENCOUNTER — Ambulatory Visit: Payer: Medicare Other | Admitting: *Deleted

## 2020-01-07 DIAGNOSIS — M25561 Pain in right knee: Secondary | ICD-10-CM | POA: Diagnosis not present

## 2020-01-07 DIAGNOSIS — M6281 Muscle weakness (generalized): Secondary | ICD-10-CM | POA: Diagnosis not present

## 2020-01-07 DIAGNOSIS — M25562 Pain in left knee: Secondary | ICD-10-CM

## 2020-01-07 DIAGNOSIS — G8929 Other chronic pain: Secondary | ICD-10-CM | POA: Diagnosis not present

## 2020-01-07 NOTE — Therapy (Signed)
[facility] [place], [place], [address] Phone: [phone]   Fax:  [phone]  Physical Therapy Treatment  Patient Details  Name: Gregory Pearson MRN: [id number] Date of Birth: December 01, 1927 Referring Provider (PT): Gaynelle Arabian MD   Encounter Date: 01/07/2020  PT End of Session - 01/07/20 1004    Visit Number  8    Number of Visits  8    Date for PT Re-Evaluation  01/10/20    PT Start Time  0945    PT Stop Time  3382    PT Time Calculation (min)  47 min       Past Medical History:  Diagnosis Date  . Asbestos exposure   . Blood transfusion without reported diagnosis    after ruptured appendix 1963  . Diverticulosis   . Gastric ulcer 06/2019  . GERD (gastroesophageal reflux disease)   . History of stomach ulcers 1995   H.pylori s/p treatment  . History of stomach ulcers   . Hyperlipidemia   . Muscle strain of right gluteal region 01/24/2014  . Prostate cancer [facility]) 2002   prostatectomy  . Traumatic hematoma of buttock 01/24/2014    Past Surgical History:  Procedure Laterality Date  . APPENDECTOMY  1963   ruptured appendix/gangrene  . BIOPSY  07/06/2019   Procedure: BIOPSY;  Surgeon: Daneil Dolin, MD;  [facility];  Service: Endoscopy;;  . COLONOSCOPY  2013   Dr. Fuller Plan: multiple colon polyps (tubular adenomas), diverticulosis. no further surveillance colonoscopies due to age.  . ESOPHAGOGASTRODUODENOSCOPY  5053,9767   Dr. Fuller Plan: gastric ulcer (h.pylori + s/p tx), follow up EGD verified ulcer healing  . ESOPHAGOGASTRODUODENOSCOPY N/A 09/26/2016   Procedure: ESOPHAGOGASTRODUODENOSCOPY (EGD);  Surgeon: Daneil Dolin, MD;  [facility];  Service: Endoscopy;  Laterality: N/A;  . ESOPHAGOGASTRODUODENOSCOPY N/A 01/23/2017   Procedure: ESOPHAGOGASTRODUODENOSCOPY (EGD);  Surgeon: Daneil Dolin, MD;  [facility];  Service: Endoscopy;  Laterality: N/A;  12:15pm  .  ESOPHAGOGASTRODUODENOSCOPY N/A 07/06/2019   Procedure: ESOPHAGOGASTRODUODENOSCOPY (EGD);  Surgeon: Daneil Dolin, MD; normal esophagus, nonbleeding gastric ulcer with pigmented material, abnormal gastric mucosa of uncertain significance s/p biopsy, normal duodenum.  Pathology with mild reactive gastropathy, mild chronic gastritis, H. pylori negative.  . ESOPHAGOGASTRODUODENOSCOPY N/A 10/20/2019   Procedure: ESOPHAGOGASTRODUODENOSCOPY (EGD);  Surgeon: Daneil Dolin, MD;  [facility];  Service: Endoscopy;  Laterality: N/A;  9:30am  . [place]  . LAPAROSCOPIC CHOLECYSTECTOMY  2003  . PROSTATECTOMY  2002    There were no vitals filed for this visit.  Subjective Assessment - 01/07/20 1000    Subjective  COVID-19 screen performed prior to patient entering clinic.  Patient arrives feeling good and ready to DC    Pertinent History  Right gluteal strain, GERD, prostatectomy.    How long can you walk comfortably?  Short community distances.    Currently in Pain?  No/denies                        [facility] Adult PT Treatment/Exercise - 01/07/20 0001      Knee/Hip Exercises: Aerobic   Nustep  L5 x 15 minutes      Knee/Hip Exercises: Machines for Strengthening   Cybex Knee Extension  1 pl x10,2 plx10    Cybex Knee Flexion  3pl, 4pl 2x 10      Knee/Hip Exercises: Standing   Hip Abduction  --    Forward  Step Up  Both;2 sets;10 reps;Step Height: 6";Hand Hold: 2    Stairs  up/down using 1 rail with reciprocal gait pattern    Rocker Board  4 minutes      Knee/Hip Exercises: Seated   Sit to Sand  without UE support;2 sets;5 reps;10 reps                  PT Long Term Goals - 01/07/20 1001      PT LONG TERM GOAL #1   Title  Independent with a HEP.    Baseline  Met 12/20/19    Time  4    Period  Weeks    Status  Achieved      PT LONG TERM GOAL #2   Title  Sit to stand x 5 with minimal use of armrests and no use of momentum.    Time  4     Period  Weeks    Status  Achieved      PT LONG TERM GOAL #3   Title  Right hip strength= 5/5.    Time  4    Period  Weeks    Status  Achieved      PT LONG TERM GOAL #4   Title  Perform a reciprocating stair gait with one railing.    Time  4    Period  Weeks    Status  Achieved            Plan - 01/07/20 1022    Clinical Impression Statement  Pt arrived today doing great and was able to perform all Exs and meet all LTGs today. He is joining the [employer] and plans to continue HEP as well. Dc today    Personal Factors and Comorbidities  Comorbidity 1;Comorbidity 2    Comorbidities  Right gluteal strain, GERD, prostatectomy.    Examination-Activity Limitations  Transfers;Stairs    Stability/Clinical Decision Making  Evolving/Moderate complexity    Rehab Potential  Good    PT Frequency  2x / week    PT Duration  4 weeks    PT Treatment/Interventions  Cryotherapy;Electrical Stimulation;Moist Heat;Gait training;Stair training;Functional mobility training;Therapeutic activities;Therapeutic exercise;Manual techniques;Patient/family education;Passive range of motion;Joint Manipulations    PT Next Visit Plan  DC to HEP and Gym program       Patient will benefit from skilled therapeutic intervention in order to improve the following deficits and impairments:  Decreased activity tolerance, Decreased range of motion, Pain, Decreased strength  Visit Diagnosis: Chronic pain of right knee  Chronic pain of left knee  Muscle weakness (generalized)     Problem List Patient Active Problem List   Diagnosis Date Noted  . Gastric ulcer 08/11/2019  . History of gastric ulcer 08/11/2019  . GI bleed 07/06/2019  . History of GI bleed   . [place]   . Hyperglycemia 09/25/2016  . Venous stasis 05/17/2016  . Hyperlipidemia 07/11/2015  . Thrombocytopenia ([place]) 01/24/2014    RAMSEUR,CHRIS, PTA 01/07/2020, 10:35 AM  [facility] [place], [place], [address] Phone: [phone]   Fax:  [phone]  Name: Gregory Pearson MRN: [id number] Date of Birth: [birth year]  PHYSICAL THERAPY DISCHARGE SUMMARY  Visits from Start of Care: 8.  Current functional level related to goals / functional outcomes: See above.   Remaining deficits: All goals met.   Education / Equipment: HEP. Plan: Patient agrees to discharge.  Patient goals were met. Patient is being discharged due to meeting the stated rehab  goals.  ?????          Mali Applegate MPT

## 2020-02-20 ENCOUNTER — Other Ambulatory Visit: Payer: Self-pay | Admitting: Family Medicine

## 2020-02-20 DIAGNOSIS — M1812 Unilateral primary osteoarthritis of first carpometacarpal joint, left hand: Secondary | ICD-10-CM

## 2020-02-20 DIAGNOSIS — M1711 Unilateral primary osteoarthritis, right knee: Secondary | ICD-10-CM

## 2020-02-20 DIAGNOSIS — M1811 Unilateral primary osteoarthritis of first carpometacarpal joint, right hand: Secondary | ICD-10-CM

## 2020-02-22 NOTE — Telephone Encounter (Signed)
Diclofenac Gel and Meloxicam denied-not on current med list

## 2020-05-09 ENCOUNTER — Other Ambulatory Visit: Payer: Self-pay | Admitting: Family Medicine

## 2020-05-31 ENCOUNTER — Other Ambulatory Visit: Payer: Self-pay

## 2020-05-31 ENCOUNTER — Ambulatory Visit (INDEPENDENT_AMBULATORY_CARE_PROVIDER_SITE_OTHER): Payer: Medicare Other | Admitting: *Deleted

## 2020-05-31 DIAGNOSIS — Z23 Encounter for immunization: Secondary | ICD-10-CM | POA: Diagnosis not present

## 2020-07-05 ENCOUNTER — Other Ambulatory Visit: Payer: Self-pay

## 2020-07-05 ENCOUNTER — Ambulatory Visit (INDEPENDENT_AMBULATORY_CARE_PROVIDER_SITE_OTHER): Payer: Medicare Other

## 2020-07-05 DIAGNOSIS — Z23 Encounter for immunization: Secondary | ICD-10-CM | POA: Diagnosis not present

## 2020-07-05 NOTE — Progress Notes (Signed)
Patient given pfizer booster and tolerated well.

## 2020-09-21 ENCOUNTER — Ambulatory Visit (INDEPENDENT_AMBULATORY_CARE_PROVIDER_SITE_OTHER): Payer: Medicare Other | Admitting: Family Medicine

## 2020-09-21 ENCOUNTER — Other Ambulatory Visit: Payer: Self-pay

## 2020-09-21 ENCOUNTER — Encounter: Payer: Self-pay | Admitting: Family Medicine

## 2020-09-21 VITALS — BP 121/81 | HR 56 | Ht 72.0 in | Wt 241.0 lb

## 2020-09-21 DIAGNOSIS — Z0000 Encounter for general adult medical examination without abnormal findings: Secondary | ICD-10-CM

## 2020-09-21 DIAGNOSIS — E782 Mixed hyperlipidemia: Secondary | ICD-10-CM

## 2020-09-21 DIAGNOSIS — R739 Hyperglycemia, unspecified: Secondary | ICD-10-CM

## 2020-09-21 DIAGNOSIS — L858 Other specified epidermal thickening: Secondary | ICD-10-CM | POA: Diagnosis not present

## 2020-09-21 LAB — LIPID PANEL
Chol/HDL Ratio: 3.5 ratio (ref 0.0–5.0)
Cholesterol, Total: 180 mg/dL (ref 100–199)
HDL: 52 mg/dL (ref >39)
LDL Chol Calc (NIH): 107 mg/dL — ABNORMAL HIGH (ref 0–99)
Triglycerides: 116 mg/dL (ref 0–149)
VLDL Cholesterol Cal: 21 mg/dL (ref 5–40)

## 2020-09-21 LAB — CBC WITH DIFFERENTIAL/PLATELET
Basophils Absolute: 0 10*3/uL (ref 0.0–0.2)
Basos: 1 %
EOS (ABSOLUTE): 0.1 10*3/uL (ref 0.0–0.4)
Eos: 2 %
Hematocrit: 45.7 % (ref 37.5–51.0)
Hemoglobin: 15.7 g/dL (ref 13.0–17.7)
Immature Grans (Abs): 0 10*3/uL (ref 0.0–0.1)
Immature Granulocytes: 0 %
Lymphocytes Absolute: 1.3 10*3/uL (ref 0.7–3.1)
Lymphs: 25 %
MCH: 31.3 pg (ref 26.6–33.0)
MCHC: 34.4 g/dL (ref 31.5–35.7)
MCV: 91 fL (ref 79–97)
Monocytes Absolute: 0.5 10*3/uL (ref 0.1–0.9)
Monocytes: 9 %
Neutrophils Absolute: 3.3 10*3/uL (ref 1.4–7.0)
Neutrophils: 63 %
Platelets: 156 10*3/uL (ref 150–450)
RBC: 5.01 x10E6/uL (ref 4.14–5.80)
RDW: 12.4 % (ref 11.6–15.4)
WBC: 5.3 10*3/uL (ref 3.4–10.8)

## 2020-09-21 LAB — CMP14+EGFR
ALT: 17 IU/L (ref 0–44)
AST: 21 IU/L (ref 0–40)
Albumin/Globulin Ratio: 2 (ref 1.2–2.2)
Albumin: 4.5 g/dL (ref 3.5–4.6)
Alkaline Phosphatase: 87 IU/L (ref 44–121)
BUN/Creatinine Ratio: 16 (ref 10–24)
BUN: 15 mg/dL (ref 10–36)
Bilirubin Total: 0.4 mg/dL (ref 0.0–1.2)
CO2: 27 mmol/L (ref 20–29)
Calcium: 9.6 mg/dL (ref 8.6–10.2)
Chloride: 100 mmol/L (ref 96–106)
Creatinine, Ser: 0.96 mg/dL (ref 0.76–1.27)
GFR calc Af Amer: 80 mL/min/{1.73_m2} (ref >59)
GFR calc non Af Amer: 69 mL/min/{1.73_m2} (ref >59)
Globulin, Total: 2.2 g/dL (ref 1.5–4.5)
Glucose: 102 mg/dL — ABNORMAL HIGH (ref 65–99)
Potassium: 4.9 mmol/L (ref 3.5–5.2)
Sodium: 140 mmol/L (ref 134–144)
Total Protein: 6.7 g/dL (ref 6.0–8.5)

## 2020-09-21 MED ORDER — NABUMETONE 500 MG PO TABS: ORAL_TABLET | ORAL | 3 refills | Status: DC

## 2020-09-21 NOTE — Progress Notes (Signed)
BP 121/81   Pulse (!) 56   Ht 6' (1.829 m)   Wt 241 lb (109.3 kg)   SpO2 95%   BMI 32.69 kg/m    Subjective:   Patient ID: Gregory Pearson, male    DOB: [birth year], 87 y.o.   MRN: [id number]  HPI: Gregory Pearson is a 87 y.o. male presenting on 09/21/2020 for Medical Management of Chronic Issues (CPE)   HPI Physical exam and recheck of chronic medical issues.  Hyperlipidemia Patient is coming in for recheck of his hyperlipidemia. The patient is currently taking fish oils. They deny any issues with myalgias or history of liver damage from it. They deny any focal numbness or weakness or chest pain.   Hyperglycemia Patient has a history of hyperglycemia, will recheck his blood sugar today and see where it is at.  Patient has a skin lesion that he has noticed increasing over the past few months on the tip of his nose and has become very long and irritating and catches and he wants it removed.  Relevant past medical, surgical, family and social history reviewed and updated as indicated. Interim medical history since our last visit reviewed. Allergies and medications reviewed and updated.  Review of Systems  Constitutional: Negative for chills and fever.  Respiratory: Negative for shortness of breath and wheezing.   Cardiovascular: Negative for chest pain and leg swelling.  Musculoskeletal: Negative for back pain and gait problem.  Skin: Negative for color change, rash and wound.  All other systems reviewed and are negative.   Per HPI unless specifically indicated above   Allergies as of 09/21/2020   No Known Allergies     Medication List       Accurate as of September 21, 2020 10:26 AM. If you have any questions, ask your nurse or doctor.        STOP taking these medications   PROBIOTIC DAILY PO Stopped by: Fransisca Kaufmann Braxtyn Bojarski, MD     TAKE these medications   acetaminophen 325 MG tablet Commonly known as: TYLENOL Take 2 tablets (650 mg total) by mouth every 6 (six) hours  as needed for mild pain (or Fever >/= 101). What changed: reasons to take this   multivitamin tablet Take 1 tablet by mouth daily.   nabumetone 500 MG tablet Commonly known as: RELAFEN TAKE 2 TABLETS BY MOUTH  TWICE DAILY FOR MUSCLE AND  JOINT PAIN   Omega-3 1000 MG Caps Take 2,000 mg by mouth daily.   pantoprazole 40 MG tablet Commonly known as: Protonix Take 1 tablet (40 mg total) by mouth 2 (two) times daily. What changed: when to take this        Objective:   BP 121/81   Pulse (!) 56   Ht 6' (1.829 m)   Wt 241 lb (109.3 kg)   SpO2 95%   BMI 32.69 kg/m   Wt Readings from Last 3 Encounters:  09/21/20 241 lb (109.3 kg)  12/01/19 235 lb 3.2 oz (106.7 kg)  10/20/19 230 lb (104.3 kg)    Physical Exam Vitals and nursing note reviewed.  Constitutional:      General: He is not in acute distress.    Appearance: He is well-developed and well-nourished. He is not diaphoretic.  Eyes:     General: No scleral icterus.    Extraocular Movements: EOM normal.     Conjunctiva/sclera: Conjunctivae normal.  Neck:     Thyroid: No thyromegaly.  Cardiovascular:     Rate and Rhythm:  Normal rate and regular rhythm.     Pulses: Intact distal pulses.     Heart sounds: Normal heart sounds. No murmur heard.   Pulmonary:     Effort: Pulmonary effort is normal. No respiratory distress.     Breath sounds: Normal breath sounds. No wheezing.  Musculoskeletal:        General: No edema. Normal range of motion.     Cervical back: Neck supple.  Lymphadenopathy:     Cervical: No cervical adenopathy.  Skin:    General: Skin is warm and dry.     Findings: Lesion (Pigmented cutaneous horn on the tip of patient's nose, raised about half a centimeter, very thin, less than 0.1 cm in diameter.) present. No rash.  Neurological:     Mental Status: He is alert and oriented to person, place, and time.     Coordination: Coordination normal.  Psychiatric:        Mood and Affect: Mood and affect  normal.        Behavior: Behavior normal.       Assessment & Plan:   Problem List Items Addressed This Visit      Other   Hyperlipidemia   Relevant Orders   CMP14+EGFR   Lipid panel   Hyperglycemia   Relevant Orders   CMP14+EGFR    Other Visit Diagnoses    Physical exam    -  Primary   Relevant Orders   CBC with Differential/Platelet   CMP14+EGFR   Cutaneous horn       On patient's nose, refer to dermatology   Relevant Orders   Ambulatory referral to Dermatology      Refer to dermatology for the cutaneous horn, will check blood work today. Follow up plan: Return in about 1 year (around 09/21/2021), or if symptoms worsen or fail to improve, for Hyperlipidemia recheck and physical.  Counseling provided for all of the vaccine components Orders Placed This Encounter  Procedures  . CBC with Differential/Platelet  . CMP14+EGFR  . Lipid panel  . Ambulatory referral to Dermatology    Caryl Pina, MD [facility] 09/21/2020, 10:26 AM

## 2020-09-22 DIAGNOSIS — D0439 Carcinoma in situ of skin of other parts of face: Secondary | ICD-10-CM | POA: Diagnosis not present

## 2020-09-22 DIAGNOSIS — L570 Actinic keratosis: Secondary | ICD-10-CM | POA: Diagnosis not present

## 2020-09-22 DIAGNOSIS — D485 Neoplasm of uncertain behavior of skin: Secondary | ICD-10-CM | POA: Diagnosis not present

## 2020-09-22 DIAGNOSIS — L819 Disorder of pigmentation, unspecified: Secondary | ICD-10-CM | POA: Diagnosis not present

## 2020-11-15 DIAGNOSIS — L570 Actinic keratosis: Secondary | ICD-10-CM | POA: Diagnosis not present

## 2020-11-15 DIAGNOSIS — L905 Scar conditions and fibrosis of skin: Secondary | ICD-10-CM | POA: Diagnosis not present

## 2020-11-15 DIAGNOSIS — Z85828 Personal history of other malignant neoplasm of skin: Secondary | ICD-10-CM | POA: Diagnosis not present

## 2020-11-15 DIAGNOSIS — L819 Disorder of pigmentation, unspecified: Secondary | ICD-10-CM | POA: Diagnosis not present

## 2021-01-25 DIAGNOSIS — Z23 Encounter for immunization: Secondary | ICD-10-CM | POA: Diagnosis not present

## 2021-04-17 DIAGNOSIS — M9904 Segmental and somatic dysfunction of sacral region: Secondary | ICD-10-CM | POA: Diagnosis not present

## 2021-04-17 DIAGNOSIS — M9902 Segmental and somatic dysfunction of thoracic region: Secondary | ICD-10-CM | POA: Diagnosis not present

## 2021-04-17 DIAGNOSIS — M5431 Sciatica, right side: Secondary | ICD-10-CM | POA: Diagnosis not present

## 2021-04-17 DIAGNOSIS — M9903 Segmental and somatic dysfunction of lumbar region: Secondary | ICD-10-CM | POA: Diagnosis not present

## 2021-04-18 DIAGNOSIS — M9903 Segmental and somatic dysfunction of lumbar region: Secondary | ICD-10-CM | POA: Diagnosis not present

## 2021-04-18 DIAGNOSIS — M9904 Segmental and somatic dysfunction of sacral region: Secondary | ICD-10-CM | POA: Diagnosis not present

## 2021-04-18 DIAGNOSIS — M5431 Sciatica, right side: Secondary | ICD-10-CM | POA: Diagnosis not present

## 2021-04-18 DIAGNOSIS — M9902 Segmental and somatic dysfunction of thoracic region: Secondary | ICD-10-CM | POA: Diagnosis not present

## 2021-04-19 DIAGNOSIS — M9903 Segmental and somatic dysfunction of lumbar region: Secondary | ICD-10-CM | POA: Diagnosis not present

## 2021-04-19 DIAGNOSIS — M9902 Segmental and somatic dysfunction of thoracic region: Secondary | ICD-10-CM | POA: Diagnosis not present

## 2021-04-19 DIAGNOSIS — M5431 Sciatica, right side: Secondary | ICD-10-CM | POA: Diagnosis not present

## 2021-04-19 DIAGNOSIS — M9904 Segmental and somatic dysfunction of sacral region: Secondary | ICD-10-CM | POA: Diagnosis not present

## 2021-04-23 DIAGNOSIS — M9904 Segmental and somatic dysfunction of sacral region: Secondary | ICD-10-CM | POA: Diagnosis not present

## 2021-04-23 DIAGNOSIS — M9903 Segmental and somatic dysfunction of lumbar region: Secondary | ICD-10-CM | POA: Diagnosis not present

## 2021-04-23 DIAGNOSIS — M5431 Sciatica, right side: Secondary | ICD-10-CM | POA: Diagnosis not present

## 2021-04-23 DIAGNOSIS — M9902 Segmental and somatic dysfunction of thoracic region: Secondary | ICD-10-CM | POA: Diagnosis not present

## 2021-04-25 DIAGNOSIS — M9902 Segmental and somatic dysfunction of thoracic region: Secondary | ICD-10-CM | POA: Diagnosis not present

## 2021-04-25 DIAGNOSIS — M9903 Segmental and somatic dysfunction of lumbar region: Secondary | ICD-10-CM | POA: Diagnosis not present

## 2021-04-25 DIAGNOSIS — M5431 Sciatica, right side: Secondary | ICD-10-CM | POA: Diagnosis not present

## 2021-04-25 DIAGNOSIS — M9904 Segmental and somatic dysfunction of sacral region: Secondary | ICD-10-CM | POA: Diagnosis not present

## 2021-04-26 DIAGNOSIS — M9903 Segmental and somatic dysfunction of lumbar region: Secondary | ICD-10-CM | POA: Diagnosis not present

## 2021-04-26 DIAGNOSIS — M9902 Segmental and somatic dysfunction of thoracic region: Secondary | ICD-10-CM | POA: Diagnosis not present

## 2021-04-26 DIAGNOSIS — M9904 Segmental and somatic dysfunction of sacral region: Secondary | ICD-10-CM | POA: Diagnosis not present

## 2021-04-26 DIAGNOSIS — M5431 Sciatica, right side: Secondary | ICD-10-CM | POA: Diagnosis not present

## 2021-05-01 DIAGNOSIS — M9904 Segmental and somatic dysfunction of sacral region: Secondary | ICD-10-CM | POA: Diagnosis not present

## 2021-05-01 DIAGNOSIS — M5431 Sciatica, right side: Secondary | ICD-10-CM | POA: Diagnosis not present

## 2021-05-01 DIAGNOSIS — M9902 Segmental and somatic dysfunction of thoracic region: Secondary | ICD-10-CM | POA: Diagnosis not present

## 2021-05-01 DIAGNOSIS — M9903 Segmental and somatic dysfunction of lumbar region: Secondary | ICD-10-CM | POA: Diagnosis not present

## 2021-05-02 ENCOUNTER — Telehealth: Payer: Self-pay | Admitting: Family Medicine

## 2021-05-02 ENCOUNTER — Other Ambulatory Visit: Payer: Self-pay

## 2021-05-02 DIAGNOSIS — M9903 Segmental and somatic dysfunction of lumbar region: Secondary | ICD-10-CM | POA: Diagnosis not present

## 2021-05-02 DIAGNOSIS — M9904 Segmental and somatic dysfunction of sacral region: Secondary | ICD-10-CM | POA: Diagnosis not present

## 2021-05-02 DIAGNOSIS — R350 Frequency of micturition: Secondary | ICD-10-CM

## 2021-05-02 DIAGNOSIS — M5431 Sciatica, right side: Secondary | ICD-10-CM | POA: Diagnosis not present

## 2021-05-02 DIAGNOSIS — M9902 Segmental and somatic dysfunction of thoracic region: Secondary | ICD-10-CM | POA: Diagnosis not present

## 2021-05-02 DIAGNOSIS — E782 Mixed hyperlipidemia: Secondary | ICD-10-CM

## 2021-05-02 NOTE — Telephone Encounter (Signed)
Future labs ordered. Pt informed. He c/o urinary frequency. UA, culture, PSA

## 2021-05-02 NOTE — Telephone Encounter (Signed)
Pt wants to get labs before his appt. Please call and let him know when they are added.

## 2021-05-03 ENCOUNTER — Other Ambulatory Visit: Payer: Medicare Other

## 2021-05-03 ENCOUNTER — Other Ambulatory Visit: Payer: Self-pay

## 2021-05-03 DIAGNOSIS — R350 Frequency of micturition: Secondary | ICD-10-CM

## 2021-05-03 DIAGNOSIS — M9902 Segmental and somatic dysfunction of thoracic region: Secondary | ICD-10-CM | POA: Diagnosis not present

## 2021-05-03 DIAGNOSIS — E782 Mixed hyperlipidemia: Secondary | ICD-10-CM

## 2021-05-03 DIAGNOSIS — M5431 Sciatica, right side: Secondary | ICD-10-CM | POA: Diagnosis not present

## 2021-05-03 DIAGNOSIS — M9903 Segmental and somatic dysfunction of lumbar region: Secondary | ICD-10-CM | POA: Diagnosis not present

## 2021-05-03 DIAGNOSIS — M9904 Segmental and somatic dysfunction of sacral region: Secondary | ICD-10-CM | POA: Diagnosis not present

## 2021-05-04 ENCOUNTER — Other Ambulatory Visit: Payer: Medicare Other

## 2021-05-04 DIAGNOSIS — R350 Frequency of micturition: Secondary | ICD-10-CM | POA: Diagnosis not present

## 2021-05-04 DIAGNOSIS — E782 Mixed hyperlipidemia: Secondary | ICD-10-CM | POA: Diagnosis not present

## 2021-05-04 LAB — URINALYSIS
Bilirubin, UA: NEGATIVE
Glucose, UA: NEGATIVE
Ketones, UA: NEGATIVE
Nitrite, UA: POSITIVE — AB
Protein,UA: NEGATIVE
Specific Gravity, UA: 1.015 (ref 1.005–1.030)
Urobilinogen, Ur: 0.2 mg/dL (ref 0.2–1.0)
pH, UA: 7.5 (ref 5.0–7.5)

## 2021-05-05 LAB — CMP14+EGFR
ALT: 12 IU/L (ref 0–44)
AST: 13 IU/L (ref 0–40)
Albumin/Globulin Ratio: 2.2 (ref 1.2–2.2)
Albumin: 4.6 g/dL (ref 3.5–4.6)
Alkaline Phosphatase: 120 IU/L (ref 44–121)
BUN/Creatinine Ratio: 16 (ref 10–24)
BUN: 12 mg/dL (ref 10–36)
Bilirubin Total: 0.4 mg/dL (ref 0.0–1.2)
CO2: 25 mmol/L (ref 20–29)
Calcium: 9.1 mg/dL (ref 8.6–10.2)
Chloride: 101 mmol/L (ref 96–106)
Creatinine, Ser: 0.75 mg/dL — ABNORMAL LOW (ref 0.76–1.27)
Globulin, Total: 2.1 g/dL (ref 1.5–4.5)
Glucose: 100 mg/dL — ABNORMAL HIGH (ref 65–99)
Potassium: 4.6 mmol/L (ref 3.5–5.2)
Sodium: 143 mmol/L (ref 134–144)
Total Protein: 6.7 g/dL (ref 6.0–8.5)
eGFR: 85 mL/min/{1.73_m2} (ref >59)

## 2021-05-05 LAB — CBC WITH DIFFERENTIAL/PLATELET
Basophils Absolute: 0.1 10*3/uL (ref 0.0–0.2)
Basos: 1 %
EOS (ABSOLUTE): 0.1 10*3/uL (ref 0.0–0.4)
Eos: 1 %
Hematocrit: 48.2 % (ref 37.5–51.0)
Hemoglobin: 15.9 g/dL (ref 13.0–17.7)
Immature Grans (Abs): 0 10*3/uL (ref 0.0–0.1)
Immature Granulocytes: 0 %
Lymphocytes Absolute: 1.3 10*3/uL (ref 0.7–3.1)
Lymphs: 15 %
MCH: 31 pg (ref 26.6–33.0)
MCHC: 33 g/dL (ref 31.5–35.7)
MCV: 94 fL (ref 79–97)
Monocytes Absolute: 0.7 10*3/uL (ref 0.1–0.9)
Monocytes: 7 %
Neutrophils Absolute: 6.8 10*3/uL (ref 1.4–7.0)
Neutrophils: 76 %
Platelets: 151 10*3/uL (ref 150–450)
RBC: 5.13 x10E6/uL (ref 4.14–5.80)
RDW: 12.7 % (ref 11.6–15.4)
WBC: 9 10*3/uL (ref 3.4–10.8)

## 2021-05-05 LAB — PSA, TOTAL AND FREE
PSA, Free: <0.01 ng/mL
Prostate Specific Ag, Serum: <0.1 ng/mL (ref 0.0–4.0)

## 2021-05-05 LAB — LIPID PANEL
Chol/HDL Ratio: 3.1 ratio (ref 0.0–5.0)
Cholesterol, Total: 187 mg/dL (ref 100–199)
HDL: 61 mg/dL (ref >39)
LDL Chol Calc (NIH): 109 mg/dL — ABNORMAL HIGH (ref 0–99)
Triglycerides: 92 mg/dL (ref 0–149)
VLDL Cholesterol Cal: 17 mg/dL (ref 5–40)

## 2021-05-07 ENCOUNTER — Telehealth: Payer: Self-pay | Admitting: Family Medicine

## 2021-05-07 DIAGNOSIS — M9904 Segmental and somatic dysfunction of sacral region: Secondary | ICD-10-CM | POA: Diagnosis not present

## 2021-05-07 DIAGNOSIS — M5431 Sciatica, right side: Secondary | ICD-10-CM | POA: Diagnosis not present

## 2021-05-07 DIAGNOSIS — M9902 Segmental and somatic dysfunction of thoracic region: Secondary | ICD-10-CM | POA: Diagnosis not present

## 2021-05-07 DIAGNOSIS — M9903 Segmental and somatic dysfunction of lumbar region: Secondary | ICD-10-CM | POA: Diagnosis not present

## 2021-05-07 LAB — URINE CULTURE

## 2021-05-07 NOTE — Telephone Encounter (Signed)
Informed pt that Dettinger has been out of the office since last Friday but will be back tomorrow.   Pt states that he is supposed to start on a medication pending lab results. Would like a call back tomorrow when Dettinger returns.

## 2021-05-08 DIAGNOSIS — M9902 Segmental and somatic dysfunction of thoracic region: Secondary | ICD-10-CM | POA: Diagnosis not present

## 2021-05-08 DIAGNOSIS — M9903 Segmental and somatic dysfunction of lumbar region: Secondary | ICD-10-CM | POA: Diagnosis not present

## 2021-05-08 DIAGNOSIS — M5431 Sciatica, right side: Secondary | ICD-10-CM | POA: Diagnosis not present

## 2021-05-08 DIAGNOSIS — M9904 Segmental and somatic dysfunction of sacral region: Secondary | ICD-10-CM | POA: Diagnosis not present

## 2021-05-08 MED ORDER — CEPHALEXIN 500 MG PO CAPS: 500.0000 mg | ORAL_CAPSULE | Freq: Four times a day (QID) | ORAL | 0 refills | Status: DC

## 2021-05-08 MED ORDER — CEFDINIR 300 MG PO CAPS: 300.0000 mg | ORAL_CAPSULE | Freq: Two times a day (BID) | ORAL | 0 refills | Status: DC

## 2021-05-08 NOTE — Telephone Encounter (Signed)
I do not recall what medicine we were discussing but if he can recall that and let me know or if not we will discuss it at the visit.

## 2021-05-08 NOTE — Progress Notes (Signed)
Patient's urine grew a bacteria E. coli, sent Keflex for him by accident, have him ignore that and take [facility] which I sent for him as well.  There was resistance to the Keflex.

## 2021-05-09 DIAGNOSIS — M5431 Sciatica, right side: Secondary | ICD-10-CM | POA: Diagnosis not present

## 2021-05-09 DIAGNOSIS — M9902 Segmental and somatic dysfunction of thoracic region: Secondary | ICD-10-CM | POA: Diagnosis not present

## 2021-05-09 DIAGNOSIS — M9903 Segmental and somatic dysfunction of lumbar region: Secondary | ICD-10-CM | POA: Diagnosis not present

## 2021-05-09 DIAGNOSIS — M9904 Segmental and somatic dysfunction of sacral region: Secondary | ICD-10-CM | POA: Diagnosis not present

## 2021-05-09 NOTE — Telephone Encounter (Signed)
Pt called - he was talking about the urine issue - med was sent in last night - pt aware

## 2021-05-09 NOTE — Progress Notes (Signed)
Pt aware of correct med -WM called and aware

## 2021-05-16 DIAGNOSIS — M9904 Segmental and somatic dysfunction of sacral region: Secondary | ICD-10-CM | POA: Diagnosis not present

## 2021-05-16 DIAGNOSIS — M9903 Segmental and somatic dysfunction of lumbar region: Secondary | ICD-10-CM | POA: Diagnosis not present

## 2021-05-16 DIAGNOSIS — M5431 Sciatica, right side: Secondary | ICD-10-CM | POA: Diagnosis not present

## 2021-05-16 DIAGNOSIS — M9902 Segmental and somatic dysfunction of thoracic region: Secondary | ICD-10-CM | POA: Diagnosis not present

## 2021-05-17 ENCOUNTER — Other Ambulatory Visit: Payer: Self-pay

## 2021-05-17 ENCOUNTER — Encounter: Payer: Self-pay | Admitting: Family Medicine

## 2021-05-17 ENCOUNTER — Ambulatory Visit (INDEPENDENT_AMBULATORY_CARE_PROVIDER_SITE_OTHER): Payer: Medicare Other | Admitting: Family Medicine

## 2021-05-17 VITALS — BP 122/75 | HR 82 | Ht 72.0 in | Wt 230.0 lb

## 2021-05-17 DIAGNOSIS — R208 Other disturbances of skin sensation: Secondary | ICD-10-CM | POA: Diagnosis not present

## 2021-05-17 DIAGNOSIS — L570 Actinic keratosis: Secondary | ICD-10-CM | POA: Diagnosis not present

## 2021-05-17 DIAGNOSIS — E782 Mixed hyperlipidemia: Secondary | ICD-10-CM | POA: Diagnosis not present

## 2021-05-17 DIAGNOSIS — R351 Nocturia: Secondary | ICD-10-CM

## 2021-05-17 DIAGNOSIS — Z8719 Personal history of other diseases of the digestive system: Secondary | ICD-10-CM | POA: Diagnosis not present

## 2021-05-17 DIAGNOSIS — N3281 Overactive bladder: Secondary | ICD-10-CM

## 2021-05-17 DIAGNOSIS — Z23 Encounter for immunization: Secondary | ICD-10-CM | POA: Diagnosis not present

## 2021-05-17 DIAGNOSIS — L821 Other seborrheic keratosis: Secondary | ICD-10-CM | POA: Diagnosis not present

## 2021-05-17 DIAGNOSIS — L819 Disorder of pigmentation, unspecified: Secondary | ICD-10-CM | POA: Diagnosis not present

## 2021-05-17 DIAGNOSIS — D179 Benign lipomatous neoplasm, unspecified: Secondary | ICD-10-CM | POA: Diagnosis not present

## 2021-05-17 DIAGNOSIS — L814 Other melanin hyperpigmentation: Secondary | ICD-10-CM | POA: Diagnosis not present

## 2021-05-17 DIAGNOSIS — Z9079 Acquired absence of other genital organ(s): Secondary | ICD-10-CM | POA: Diagnosis not present

## 2021-05-17 DIAGNOSIS — Z85828 Personal history of other malignant neoplasm of skin: Secondary | ICD-10-CM | POA: Diagnosis not present

## 2021-05-17 DIAGNOSIS — D225 Melanocytic nevi of trunk: Secondary | ICD-10-CM | POA: Diagnosis not present

## 2021-05-17 DIAGNOSIS — Z08 Encounter for follow-up examination after completed treatment for malignant neoplasm: Secondary | ICD-10-CM | POA: Diagnosis not present

## 2021-05-17 DIAGNOSIS — D492 Neoplasm of unspecified behavior of bone, soft tissue, and skin: Secondary | ICD-10-CM | POA: Diagnosis not present

## 2021-05-17 NOTE — Progress Notes (Signed)
BP 122/75   Pulse 82   Ht 6' (1.829 m)   Wt 230 lb (104.3 kg)   SpO2 97%   BMI 31.19 kg/m    Subjective:   Patient ID: Gregory Pearson, male    DOB: 11-12-1927, 87 y.o.   MRN: [id number]  HPI: Gregory Pearson is a 87 y.o. male presenting on 05/17/2021 for Medical Management of Chronic Issues and Hyperlipidemia   HPI Nocturia Patient has had difficulty with bladder control and nocturia that is been increasing over the past year.  He says he got his prostate removed many many years ago and had a little bit of control issues but nothing like he has had recently.  He says now he cannot make it to the restroom sometimes and he frequently has to wake up 4 times at night to go.  Hyperlipidemia Patient is coming in for recheck of his hyperlipidemia. The patient is currently taking fish oil. They deny any issues with myalgias or history of liver damage from it. They deny any focal numbness or weakness or chest pain.   History of GI bleed Patient is currently on Protonix.  She denies any major symptoms or abdominal pain or belching or burping. She denies any blood in her stool or lightheadedness or dizziness.   Relevant past medical, surgical, family and social history reviewed and updated as indicated. Interim medical history since our last visit reviewed. Allergies and medications reviewed and updated.  Review of Systems  Constitutional:  Negative for chills and fever.  Respiratory:  Negative for shortness of breath and wheezing.   Cardiovascular:  Negative for chest pain and leg swelling.  Musculoskeletal:  Negative for back pain and gait problem.  Skin:  Negative for rash.  Neurological:  Negative for dizziness, weakness and light-headedness.  All other systems reviewed and are negative.  Per HPI unless specifically indicated above   Allergies as of 05/17/2021   No Known Allergies      Medication List        Accurate as of May 17, 2021  3:56 PM. If you have any questions,  ask your nurse or doctor.          acetaminophen 325 MG tablet Commonly known as: TYLENOL Take 2 tablets (650 mg total) by mouth every 6 (six) hours as needed for mild pain (or Fever >/= 101). What changed: reasons to take this   cefdinir 300 MG capsule Commonly known as: OMNICEF Take 1 capsule (300 mg total) by mouth 2 (two) times daily. 1 po BID   multivitamin tablet Take 1 tablet by mouth daily.   nabumetone 500 MG tablet Commonly known as: RELAFEN TAKE 2 TABLETS BY MOUTH  TWICE DAILY FOR MUSCLE AND  JOINT PAIN   Omega-3 1000 MG Caps Take 2,000 mg by mouth daily.   pantoprazole 40 MG tablet Commonly known as: Protonix Take 1 tablet (40 mg total) by mouth 2 (two) times daily. What changed: when to take this         Objective:   BP 122/75   Pulse 82   Ht 6' (1.829 m)   Wt 230 lb (104.3 kg)   SpO2 97%   BMI 31.19 kg/m   Wt Readings from Last 3 Encounters:  05/17/21 230 lb (104.3 kg)  09/21/20 241 lb (109.3 kg)  12/01/19 235 lb 3.2 oz (106.7 kg)    Physical Exam Vitals and nursing note reviewed.  Constitutional:      General: He is not in acute  distress.    Appearance: He is well-developed. He is not diaphoretic.  Eyes:     General: No scleral icterus.    Conjunctiva/sclera: Conjunctivae normal.  Neck:     Thyroid: No thyromegaly.  Cardiovascular:     Rate and Rhythm: Normal rate and regular rhythm.     Heart sounds: Normal heart sounds. No murmur heard. Pulmonary:     Effort: Pulmonary effort is normal. No respiratory distress.     Breath sounds: Normal breath sounds. No wheezing.  Musculoskeletal:        General: Normal range of motion.     Cervical back: Neck supple.  Lymphadenopathy:     Cervical: No cervical adenopathy.  Skin:    General: Skin is warm and dry.     Findings: No rash.  Neurological:     Mental Status: He is alert and oriented to person, place, and time.     Coordination: Coordination normal.  Psychiatric:        Behavior:  Behavior normal.    Results for orders placed or performed in visit on 05/03/21  Urine Culture   Specimen: Urine   UR  Result Value Ref Range   Urine Culture, Routine Final report (A)    Organism ID, Bacteria Escherichia coli (A)    Antimicrobial Susceptibility Comment   Urinalysis  Result Value Ref Range   Specific Gravity, UA 1.015 1.005 - 1.030   pH, UA 7.5 5.0 - 7.5   Color, UA Yellow Yellow   Appearance Ur Cloudy (A) Clear   Leukocytes,UA 3+ (A) Negative   Protein,UA Negative Negative/Trace   Glucose, UA Negative Negative   Ketones, UA Negative Negative   RBC, UA 1+ (A) Negative   Bilirubin, UA Negative Negative   Urobilinogen, Ur 0.2 0.2 - 1.0 mg/dL   Nitrite, UA Positive (A) Negative  PSA, total and free  Result Value Ref Range   Prostate Specific Ag, Serum <0.1 0.0 - 4.0 ng/mL   PSA, Free <0.01 N/A ng/mL   PSA, Free Pct CANCELED %  Lipid panel  Result Value Ref Range   Cholesterol, Total 187 100 - 199 mg/dL   Triglycerides 92 0 - 149 mg/dL   HDL 61 >39 mg/dL   VLDL Cholesterol Cal 17 5 - 40 mg/dL   LDL Chol Calc (NIH) 109 (H) 0 - 99 mg/dL   Chol/HDL Ratio 3.1 0.0 - 5.0 ratio  CMP14+EGFR  Result Value Ref Range   Glucose 100 (H) 65 - 99 mg/dL   BUN 12 10 - 36 mg/dL   Creatinine, Ser 0.75 (L) 0.76 - 1.27 mg/dL   eGFR 85 >59 mL/min/1.73   BUN/Creatinine Ratio 16 10 - 24   Sodium 143 134 - 144 mmol/L   Potassium 4.6 3.5 - 5.2 mmol/L   Chloride 101 96 - 106 mmol/L   CO2 25 20 - 29 mmol/L   Calcium 9.1 8.6 - 10.2 mg/dL   Total Protein 6.7 6.0 - 8.5 g/dL   Albumin 4.6 3.5 - 4.6 g/dL   Globulin, Total 2.1 1.5 - 4.5 g/dL   Albumin/Globulin Ratio 2.2 1.2 - 2.2   Bilirubin Total 0.4 0.0 - 1.2 mg/dL   Alkaline Phosphatase 120 44 - 121 IU/L   AST 13 0 - 40 IU/L   ALT 12 0 - 44 IU/L  CBC with Differential/Platelet  Result Value Ref Range   WBC 9.0 3.4 - 10.8 x10E3/uL   RBC 5.13 4.14 - 5.80 x10E6/uL   Hemoglobin 15.9 13.0 -  17.7 g/dL   Hematocrit 48.2 37.5 -  51.0 %   MCV 94 79 - 97 fL   MCH 31.0 26.6 - 33.0 pg   MCHC 33.0 31.5 - 35.7 g/dL   RDW 12.7 11.6 - 15.4 %   Platelets 151 150 - 450 x10E3/uL   Neutrophils 76 Not Estab. %   Lymphs 15 Not Estab. %   Monocytes 7 Not Estab. %   Eos 1 Not Estab. %   Basos 1 Not Estab. %   Neutrophils Absolute 6.8 1.4 - 7.0 x10E3/uL   Lymphocytes Absolute 1.3 0.7 - 3.1 x10E3/uL   Monocytes Absolute 0.7 0.1 - 0.9 x10E3/uL   EOS (ABSOLUTE) 0.1 0.0 - 0.4 x10E3/uL   Basophils Absolute 0.1 0.0 - 0.2 x10E3/uL   Immature Granulocytes 0 Not Estab. %   Immature Grans (Abs) 0.0 0.0 - 0.1 x10E3/uL    Assessment & Plan:   Problem List Items Addressed This Visit       Other   Hyperlipidemia - Primary   History of GI bleed   Other Visit Diagnoses     Need for immunization against influenza       Relevant Orders   Flu Vaccine QUAD High Dose(Fluad) (Completed)   Nocturia       OAB (overactive bladder)       History of prostatectomy         Gave 4-week sample of Myrbetriq to see if it helps him with his bladder control, with his history of prostatectomy it may or may not help.  If that does not help then we will consider urology referral.  No other change in medication, blood work looks pretty good.  He is finishing the antibiotic from UTI  Follow up plan: Return in about 6 months (around 11/14/2021), or if symptoms worsen or fail to improve, for Cholesterol recheck.  Counseling provided for all of the vaccine components Orders Placed This Encounter  Procedures   Flu Vaccine QUAD High Dose(Fluad)    Caryl Pina, MD [facility] 05/17/2021, 3:56 PM

## 2021-05-23 DIAGNOSIS — M9902 Segmental and somatic dysfunction of thoracic region: Secondary | ICD-10-CM | POA: Diagnosis not present

## 2021-05-23 DIAGNOSIS — M5431 Sciatica, right side: Secondary | ICD-10-CM | POA: Diagnosis not present

## 2021-05-23 DIAGNOSIS — M9903 Segmental and somatic dysfunction of lumbar region: Secondary | ICD-10-CM | POA: Diagnosis not present

## 2021-05-23 DIAGNOSIS — M9904 Segmental and somatic dysfunction of sacral region: Secondary | ICD-10-CM | POA: Diagnosis not present

## 2021-05-28 DIAGNOSIS — H6122 Impacted cerumen, left ear: Secondary | ICD-10-CM | POA: Diagnosis not present

## 2021-05-28 DIAGNOSIS — Z23 Encounter for immunization: Secondary | ICD-10-CM | POA: Diagnosis not present

## 2021-05-30 DIAGNOSIS — M5431 Sciatica, right side: Secondary | ICD-10-CM | POA: Diagnosis not present

## 2021-05-30 DIAGNOSIS — M9904 Segmental and somatic dysfunction of sacral region: Secondary | ICD-10-CM | POA: Diagnosis not present

## 2021-05-30 DIAGNOSIS — M9903 Segmental and somatic dysfunction of lumbar region: Secondary | ICD-10-CM | POA: Diagnosis not present

## 2021-05-30 DIAGNOSIS — M9902 Segmental and somatic dysfunction of thoracic region: Secondary | ICD-10-CM | POA: Diagnosis not present

## 2021-06-05 DIAGNOSIS — U071 COVID-19: Secondary | ICD-10-CM | POA: Diagnosis not present

## 2021-06-06 DIAGNOSIS — M9903 Segmental and somatic dysfunction of lumbar region: Secondary | ICD-10-CM | POA: Diagnosis not present

## 2021-06-06 DIAGNOSIS — M5431 Sciatica, right side: Secondary | ICD-10-CM | POA: Diagnosis not present

## 2021-06-06 DIAGNOSIS — M9904 Segmental and somatic dysfunction of sacral region: Secondary | ICD-10-CM | POA: Diagnosis not present

## 2021-06-06 DIAGNOSIS — M9902 Segmental and somatic dysfunction of thoracic region: Secondary | ICD-10-CM | POA: Diagnosis not present

## 2021-06-07 ENCOUNTER — Telehealth: Payer: Self-pay | Admitting: Family Medicine

## 2021-06-07 NOTE — Telephone Encounter (Signed)
Pt needs paperwork filled out for "silver sneakers" per his insurance to be able to go to the gym for free because they believe that he needs to get exercise. He is aware that he may need any appt but wanted a message sent back first to see if Dettinger would be willing to compete the paperwork without him coming in. Please call back and advise.

## 2021-06-13 DIAGNOSIS — M9902 Segmental and somatic dysfunction of thoracic region: Secondary | ICD-10-CM | POA: Diagnosis not present

## 2021-06-13 DIAGNOSIS — M9904 Segmental and somatic dysfunction of sacral region: Secondary | ICD-10-CM | POA: Diagnosis not present

## 2021-06-13 DIAGNOSIS — M9903 Segmental and somatic dysfunction of lumbar region: Secondary | ICD-10-CM | POA: Diagnosis not present

## 2021-06-13 DIAGNOSIS — M5431 Sciatica, right side: Secondary | ICD-10-CM | POA: Diagnosis not present

## 2021-06-14 ENCOUNTER — Encounter: Payer: Self-pay | Admitting: Family Medicine

## 2021-06-14 DIAGNOSIS — N529 Male erectile dysfunction, unspecified: Secondary | ICD-10-CM

## 2021-06-20 DIAGNOSIS — M5431 Sciatica, right side: Secondary | ICD-10-CM | POA: Diagnosis not present

## 2021-06-20 DIAGNOSIS — M9902 Segmental and somatic dysfunction of thoracic region: Secondary | ICD-10-CM | POA: Diagnosis not present

## 2021-06-20 DIAGNOSIS — M9904 Segmental and somatic dysfunction of sacral region: Secondary | ICD-10-CM | POA: Diagnosis not present

## 2021-06-20 DIAGNOSIS — M9903 Segmental and somatic dysfunction of lumbar region: Secondary | ICD-10-CM | POA: Diagnosis not present

## 2021-06-25 ENCOUNTER — Ambulatory Visit (INDEPENDENT_AMBULATORY_CARE_PROVIDER_SITE_OTHER): Payer: Medicare Other

## 2021-06-25 VITALS — Ht 72.0 in | Wt 215.0 lb

## 2021-06-25 DIAGNOSIS — Z0000 Encounter for general adult medical examination without abnormal findings: Secondary | ICD-10-CM

## 2021-06-25 NOTE — Patient Instructions (Signed)
Gregory Pearson , Thank you for taking time to come for your Medicare Wellness Visit. I appreciate your ongoing commitment to your health goals. Please review the following plan we discussed and let me know if I can assist you in the future.   Screening recommendations/referrals: Colonoscopy: Done 07/21/2012. No longer required.   Recommended yearly ophthalmology/optometry visit for glaucoma screening and checkup Recommended yearly dental visit for hygiene and checkup  Vaccinations: Influenza vaccine: Done 05/17/2021. Pneumococcal vaccine: Done 07/11/2015 and 05/19/2017. Tdap vaccine: Done 04/29/2019. Repeat in 10 years  Shingles vaccine: Done 12/17/2010, 09/13/2017, 06/11/2018.    Covid-19: Done 2/87/21, 10/28/19 and 07/05/2020  Advanced directives: Please bring a copy of your health care power of attorney and living will to the office to be added to your chart at your convenience.   Conditions/risks identified: Aim for 30 minutes of exercise or walking each day, drink 6-8 glasses of water and eat lots of fruits and vegetables. KEEP UP THE GOOD WORK!!!    Next appointment: Follow up in one year for your annual wellness visit. 2023.  Preventive Care 85 Years and Older, Male  Preventive care refers to lifestyle choices and visits with your health care provider that can promote health and wellness. What does preventive care include? A yearly physical exam. This is also called an annual well check. Dental exams once or twice a year. Routine eye exams. Ask your health care provider how often you should have your eyes checked. Personal lifestyle choices, including: Daily care of your teeth and gums. Regular physical activity. Eating a healthy diet. Avoiding tobacco and drug use. Limiting alcohol use. Practicing safe sex. Taking low doses of aspirin every day. Taking vitamin and mineral supplements as recommended by your health care provider. What happens during an annual well check? The  services and screenings done by your health care provider during your annual well check will depend on your age, overall health, lifestyle risk factors, and family history of disease. Counseling  Your health care provider may ask you questions about your: Alcohol use. Tobacco use. Drug use. Emotional well-being. Home and relationship well-being. Sexual activity. Eating habits. History of falls. Memory and ability to understand (cognition). Work and work Statistician. Screening  You may have the following tests or measurements: Height, weight, and BMI. Blood pressure. Lipid and cholesterol levels. These may be checked every 5 years, or more frequently if you are over 39 years old. Skin check. Lung cancer screening. You may have this screening every year starting at age 49 if you have a 30-pack-year history of smoking and currently smoke or have quit within the past 15 years. Fecal occult blood test (FOBT) of the stool. You may have this test every year starting at age 73. Flexible sigmoidoscopy or colonoscopy. You may have a sigmoidoscopy every 5 years or a colonoscopy every 10 years starting at age 59. Prostate cancer screening. Recommendations will vary depending on your family history and other risks. Hepatitis C blood test. Hepatitis B blood test. Sexually transmitted disease (STD) testing. Diabetes screening. This is done by checking your blood sugar (glucose) after you have not eaten for a while (fasting). You may have this done every 1-3 years. Abdominal aortic aneurysm (AAA) screening. You may need this if you are a current or former smoker. Osteoporosis. You may be screened starting at age 7 if you are at high risk. Talk with your health care provider about your test results, treatment options, and if necessary, the need for more tests. Vaccines  Your health care provider may recommend certain vaccines, such as: Influenza vaccine. This is recommended every year. Tetanus,  diphtheria, and acellular pertussis (Tdap, Td) vaccine. You may need a Td booster every 10 years. Zoster vaccine. You may need this after age 79. Pneumococcal 13-valent conjugate (PCV13) vaccine. One dose is recommended after age 12. Pneumococcal polysaccharide (PPSV23) vaccine. One dose is recommended after age 73. Talk to your health care provider about which screenings and vaccines you need and how often you need them. This information is not intended to replace advice given to you by your health care provider. Make sure you discuss any questions you have with your health care provider. Document Released: 09/08/2015 Document Revised: 05/01/2016 Document Reviewed: 06/13/2015 Elsevier Interactive Patient Education  2017 [place] Prevention in the Home Falls can cause injuries. They can happen to people of all ages. There are many things you can do to make your home safe and to help prevent falls. What can I do on the outside of my home? Regularly fix the edges of walkways and driveways and fix any cracks. Remove anything that might make you trip as you walk through a door, such as a raised step or threshold. Trim any bushes or trees on the path to your home. Use bright outdoor lighting. Clear any walking paths of anything that might make someone trip, such as rocks or tools. Regularly check to see if handrails are loose or broken. Make sure that both sides of any steps have handrails. Any raised decks and porches should have guardrails on the edges. Have any leaves, snow, or ice cleared regularly. Use sand or salt on walking paths during winter. Clean up any spills in your garage right away. This includes oil or grease spills. What can I do in the bathroom? Use night lights. Install grab bars by the toilet and in the tub and shower. Do not use towel bars as grab bars. Use non-skid mats or decals in the tub or shower. If you need to sit down in the shower, use a plastic,  non-slip stool. Keep the floor dry. Clean up any water that spills on the floor as soon as it happens. Remove soap buildup in the tub or shower regularly. Attach bath mats securely with double-sided non-slip rug tape. Do not have throw rugs and other things on the floor that can make you trip. What can I do in the bedroom? Use night lights. Make sure that you have a light by your bed that is easy to reach. Do not use any sheets or blankets that are too big for your bed. They should not hang down onto the floor. Have a firm chair that has side arms. You can use this for support while you get dressed. Do not have throw rugs and other things on the floor that can make you trip. What can I do in the kitchen? Clean up any spills right away. Avoid walking on wet floors. Keep items that you use a lot in easy-to-reach places. If you need to reach something above you, use a strong step stool that has a grab bar. Keep electrical cords out of the way. Do not use floor polish or wax that makes floors slippery. If you must use wax, use non-skid floor wax. Do not have throw rugs and other things on the floor that can make you trip. What can I do with my stairs? Do not leave any items on the stairs. Make sure that there are  handrails on both sides of the stairs and use them. Fix handrails that are broken or loose. Make sure that handrails are as long as the stairways. Check any carpeting to make sure that it is firmly attached to the stairs. Fix any carpet that is loose or worn. Avoid having throw rugs at the top or bottom of the stairs. If you do have throw rugs, attach them to the floor with carpet tape. Make sure that you have a light switch at the top of the stairs and the bottom of the stairs. If you do not have them, ask someone to add them for you. What else can I do to help prevent falls? Wear shoes that: Do not have high heels. Have rubber bottoms. Are comfortable and fit you well. Are closed  at the toe. Do not wear sandals. If you use a stepladder: Make sure that it is fully opened. Do not climb a closed stepladder. Make sure that both sides of the stepladder are locked into place. Ask someone to hold it for you, if possible. Clearly mark and make sure that you can see: Any grab bars or handrails. First and last steps. Where the edge of each step is. Use tools that help you move around (mobility aids) if they are needed. These include: Canes. Walkers. Scooters. Crutches. Turn on the lights when you go into a dark area. Replace any light bulbs as soon as they burn out. Set up your furniture so you have a clear path. Avoid moving your furniture around. If any of your floors are uneven, fix them. If there are any pets around you, be aware of where they are. Review your medicines with your doctor. Some medicines can make you feel dizzy. This can increase your chance of falling. Ask your doctor what other things that you can do to help prevent falls. This information is not intended to replace advice given to you by your health care provider. Make sure you discuss any questions you have with your health care provider. Document Released: 06/08/2009 Document Revised: 01/18/2016 Document Reviewed: 09/16/2014 Elsevier Interactive Patient Education  2017 [employer].

## 2021-06-25 NOTE — Progress Notes (Signed)
Subjective:   Gregory Pearson is a 87 y.o. male who presents for Medicare Annual/Subsequent preventive examination. Virtual Visit via Telephone Note  I connected with  Gregory Pearson on 06/25/21 at  2:00 PM EDT by telephone and verified that I am speaking with the correct person using two identifiers.  Location: Patient: Home Provider: [facility] Persons participating in the virtual visit: patient/Nurse Health Advisor   I discussed the limitations, risks, security and privacy concerns of performing an evaluation and management service by telephone and the availability of in person appointments. The patient expressed understanding and agreed to proceed.  Interactive audio and video telecommunications were attempted between this nurse and patient, however failed, due to patient having technical difficulties OR patient did not have access to video capability.  We continued and completed visit with audio only.  Some vital signs may be absent or patient reported.   [name], LPN  Review of Systems     Cardiac Risk Factors include: advanced age (>10men, >52 women);dyslipidemia;male gender;sedentary lifestyle     Objective:    Today's Vitals   06/25/21 1358  Weight: 215 lb (97.5 kg)  Height: 6' (1.829 m)   Body mass index is 29.16 kg/m.  Advanced Directives 06/25/2021 12/13/2019 10/20/2019 07/06/2019 07/06/2019 07/06/2019 05/21/2018  Does Patient Have a Medical Advance Directive? Yes No Yes Yes Yes Yes No  Type of Paramedic of [place];Living will - [place];Living will Living will Living will Living will -  Does patient want to make changes to medical advance directive? - - - No - Patient declined No - Patient declined - -  Copy of [place] in Chart? No - copy requested - - - - - -  Would patient like information on creating a medical advance directive? - - - - - - Yes (MAU/Ambulatory/Procedural Areas - Information  given);No - Patient declined  Pre-existing out of facility DNR order (yellow form or pink MOST form) - - - - - - -    Current Medications (verified) Outpatient Encounter Medications as of 06/25/2021  Medication Sig   acetaminophen (TYLENOL) 325 MG tablet Take 2 tablets (650 mg total) by mouth every 6 (six) hours as needed for mild pain (or Fever >/= 101). (Patient taking differently: Take 650 mg by mouth every 6 (six) hours as needed for mild pain or headache (or Fever >/= 101).)   cefdinir (OMNICEF) 300 MG capsule Take 1 capsule (300 mg total) by mouth 2 (two) times daily. 1 po BID   Multiple Vitamin (MULTIVITAMIN) tablet Take 1 tablet by mouth daily.   nabumetone (RELAFEN) 500 MG tablet TAKE 2 TABLETS BY MOUTH  TWICE DAILY FOR MUSCLE AND  JOINT PAIN   Omega-3 1000 MG CAPS Take 2,000 mg by mouth daily.   pantoprazole (PROTONIX) 40 MG tablet pantoprazole 40 mg tablet,delayed release  TAKE 1 TABLET BY MOUTH TWICE A DAY   [DISCONTINUED] pantoprazole (PROTONIX) 40 MG tablet Take 1 tablet (40 mg total) by mouth 2 (two) times daily. (Patient taking differently: Take 40 mg by mouth daily. )   No facility-administered encounter medications on file as of 06/25/2021.    Allergies (verified) Patient has no known allergies.   History: Past Medical History:  Diagnosis Date   Asbestos exposure    Blood transfusion without reported diagnosis    after ruptured appendix 1963   Diverticulosis    Gastric ulcer 06/2019   GERD (gastroesophageal reflux disease)    History  of stomach ulcers 1995   H.pylori s/p treatment   History of stomach ulcers    Hyperlipidemia    Muscle strain of right gluteal region 01/24/2014   Prostate cancer [facility]) 2002   prostatectomy   Traumatic hematoma of buttock 01/24/2014   Past Surgical History:  Procedure Laterality Date   APPENDECTOMY  1963   ruptured appendix/gangrene   BIOPSY  07/06/2019   Procedure: BIOPSY;  Surgeon: Daneil Dolin, MD;  [facility];  Service: Endoscopy;;   COLONOSCOPY  2013   Dr. Fuller Plan: multiple colon polyps (tubular adenomas), diverticulosis. no further surveillance colonoscopies due to age.   ESOPHAGOGASTRODUODENOSCOPY  3536,1443   Dr. Fuller Plan: gastric ulcer (h.pylori + s/p tx), follow up EGD verified ulcer healing   ESOPHAGOGASTRODUODENOSCOPY N/A 09/26/2016   Procedure: ESOPHAGOGASTRODUODENOSCOPY (EGD);  Surgeon: Daneil Dolin, MD;  [facility];  Service: Endoscopy;  Laterality: N/A;   ESOPHAGOGASTRODUODENOSCOPY N/A 01/23/2017   Procedure: ESOPHAGOGASTRODUODENOSCOPY (EGD);  Surgeon: Daneil Dolin, MD;  [facility];  Service: Endoscopy;  Laterality: N/A;  12:15pm   ESOPHAGOGASTRODUODENOSCOPY N/A 07/06/2019   Procedure: ESOPHAGOGASTRODUODENOSCOPY (EGD);  Surgeon: Daneil Dolin, MD; normal esophagus, nonbleeding gastric ulcer with pigmented material, abnormal gastric mucosa of uncertain significance s/p biopsy, normal duodenum.  Pathology with mild reactive gastropathy, mild chronic gastritis, H. pylori negative.   ESOPHAGOGASTRODUODENOSCOPY N/A 10/20/2019   Procedure: ESOPHAGOGASTRODUODENOSCOPY (EGD);  Surgeon: Daneil Dolin, MD;  [facility];  Service: Endoscopy;  Laterality: N/A;  9:30am   [place]   LAPAROSCOPIC CHOLECYSTECTOMY  2003   PROSTATECTOMY  2002   Family History  Problem Relation Age of Onset   Prostate cancer Brother    Prostate cancer Brother    Suicidality Brother    Prostate cancer Brother    Stomach cancer Neg Hx    Colon cancer Neg Hx    Social History   Socioeconomic History   Marital status: Widowed    Spouse name: Not on file   Number of children: 3   Years of education: Not on file   Highest education level: Not on file  Occupational History   Occupation: retired Conservation officer, historic buildings power  Tobacco Use   Smoking status: Never   Smokeless tobacco: Never  Vaping Use   Vaping Use: Never used  Substance and Sexual Activity   Alcohol use:  Not Currently    Comment: last drank about 15 years ago   Drug use: Never   Sexual activity: Not on file  Other Topics Concern   Not on file  Social History Narrative   3 children.   Widowed since 01/16/2021. Married x 61 years.    Social Determinants of Health   Financial Resource Strain: Low Risk    Difficulty of Paying Living Expenses: Not hard at all  Food Insecurity: No Food Insecurity   Worried About Charity fundraiser in the Last Year: Never true   [place] in the Last Year: Never true  Transportation Needs: No Transportation Needs   Lack of Transportation (Medical): No   Lack of Transportation (Non-Medical): No  Physical Activity: Sufficiently Active   Days of Exercise per Week: 5 days   Minutes of Exercise per Session: 30 min  Stress: No Stress Concern Present   Feeling of Stress : Not at all  Social Connections: Moderately Integrated   Frequency of Communication with Friends and Family: More than three times a week   Frequency of Social  Gatherings with Friends and Family: More than three times a week   Attends Religious Services: 1 to 4 times per year   Active Member of Clubs or Organizations: Yes   Attends Archivist Meetings: 1 to 4 times per year   Marital Status: Widowed    Tobacco Counseling Counseling given: Not Answered   Clinical Intake:  Pre-visit preparation completed: Yes  Pain : No/denies pain     BMI - recorded: 29.16 Nutritional Status: BMI 25 -29 Overweight Nutritional Risks: None Diabetes: No  How often do you need to have someone help you when you read instructions, pamphlets, or other written materials from your doctor or pharmacy?: 1 - Never  Diabetic?NO   Interpreter Needed?: No  Information entered by :: MJ Emmali Karow, LPN   Activities of Daily Living In your present state of health, do you have any difficulty performing the following activities: 06/25/2021  Hearing? N  Vision? N  Difficulty concentrating or  making decisions? N  Walking or climbing stairs? N  Dressing or bathing? N  Doing errands, shopping? N  Preparing Food and eating ? N  In the past six months, have you accidently leaked urine? N  Do you have problems with loss of bowel control? N  Managing your Medications? N  Managing your Finances? N  Housekeeping or managing your Housekeeping? N  Some recent data might be hidden    Patient Care Team: Dettinger, Fransisca Kaufmann, MD as PCP - General (Family Medicine)  Indicate any recent Medical Services you may have received from other than [facility] providers in the past year (date may be approximate).     Assessment:   This is a routine wellness examination for [place].  Hearing/Vision screen Hearing Screening - Comments:: Some hearing issues. Wears hearing aids.  Vision Screening - Comments:: Glasses. [facility]. 2020.  Dietary issues and exercise activities discussed: Current Exercise Habits: Home exercise routine;Structured exercise class, Type of exercise: walking;strength training/weights;Other - see comments (Pt goes to [place] 4 x per week.), Time (Minutes): 25, Frequency (Times/Week): 5, Weekly Exercise (Minutes/Week): 125, Intensity: Mild, Exercise limited by: cardiac condition(s);orthopedic condition(s)   Goals Addressed             This Visit's Progress    Exercise 3x per week (30 min per time)   On track      Depression Screen PHQ 2/9 Scores 06/25/2021 05/17/2021 09/21/2020 12/01/2019 08/09/2019 03/18/2019 07/20/2018  PHQ - 2 Score 0 0 0 0 0 0 0    Fall Risk Fall Risk  06/25/2021 05/17/2021 09/21/2020 12/01/2019 08/09/2019  Falls in the past year? 0 0 0 0 0  Comment - - - - -  Number falls in past yr: 0 - - 0 -  Injury with Fall? 0 - - 0 -  Risk for fall due to : Impaired balance/gait;Impaired vision - - Impaired balance/gait -  Follow up Falls prevention discussed - - Falls evaluation completed -    FALL RISK PREVENTION PERTAINING TO THE HOME:  Any stairs in or  around the home? Yes  If so, are there any without handrails? No  Home free of loose throw rugs in walkways, pet beds, electrical cords, etc? Yes  Adequate lighting in your home to reduce risk of falls? Yes   ASSISTIVE DEVICES UTILIZED TO PREVENT FALLS:  Life alert? No  Use of a cane, walker or w/c? Yes  Grab bars in the bathroom? Yes  Shower chair or bench in shower? Yes  Elevated  toilet seat or a handicapped toilet? Yes   TIMED UP AND GO:  Was the test performed? No . Phone visit.   Cognitive Function: MMSE - Mini Mental State Exam 05/21/2018 05/19/2017 05/17/2016  Orientation to time 5 5 5   Orientation to Place 5 5 5   Registration 3 3 3   Attention/ Calculation 3 2 2   Recall 3 3 3   Language- name 2 objects 2 2 2   Language- repeat 1 0 1  Language- follow 3 step command 3 3 3   Language- read & follow direction 1 1 1   Write a sentence 1 1 1   Copy design 1 1 1   Total score 28 26 27      6CIT Screen 06/25/2021  What Year? 0 points  What month? 0 points  What time? 0 points  Count back from 20 0 points  Months in reverse 4 points  Repeat phrase 0 points  Total Score 4    Immunizations Immunization History  Administered Date(s) Administered   Fluad Quad(high Dose 65+) 04/29/2019, 05/31/2020, 05/17/2021   Influenza, High Dose Seasonal PF 05/20/2016, 05/19/2017, 05/20/2018   Influenza,inj,Quad PF,6+ Mos 06/21/2013, 05/25/2014, 06/07/2015   PFIZER(Purple Top)SARS-COV-2 Vaccination 10/03/2019, 10/28/2019, 07/05/2020   Pneumococcal Conjugate-13 07/11/2015   Pneumococcal Polysaccharide-23 05/19/2017   Td 04/29/2019   Tdap 05/27/2007   Zoster Recombinat (Shingrix) 09/23/2017, 06/11/2018   Zoster, Live 12/17/2010    TDAP status: Up to date  Flu Vaccine status: Up to date  Pneumococcal vaccine status: Up to date  Covid-19 vaccine status: Information provided on how to obtain vaccines.   Qualifies for Shingles Vaccine? Yes   Zostavax completed Yes   Shingrix  Completed?: Yes  Screening Tests Health Maintenance  Topic Date Due   COVID-19 Vaccine (4 - Booster for Pfizer series) 08/13/2021 (Originally 08/30/2020)   TETANUS/TDAP  04/28/2029   Pneumonia Vaccine 3+ Years old  Completed   INFLUENZA VACCINE  Completed   Zoster Vaccines- Shingrix  Completed   HPV VACCINES  Aged Out    Health Maintenance  There are no preventive care reminders to display for this patient.  Colorectal cancer screening: Type of screening: Colonoscopy. Completed 07/21/2012. Repeat every NO LONGER REQUIRED DUE TO AGE. years  Lung Cancer Screening: (Low Dose CT Chest recommended if Age 55-80 years, 30 pack-year currently smoking OR have quit w/in 15years.) does qualify.     Additional Screening:  Hepatitis C Screening: does not qualify.  Vision Screening: Recommended annual ophthalmology exams for early detection of glaucoma and other disorders of the eye. Is the patient up to date with their annual eye exam?  No  Who is the provider or what is the name of the office in which the patient attends annual eye exams? [facility] If pt is not established with a provider, would they like to be referred to a provider to establish care? No .   Dental Screening: Recommended annual dental exams for proper oral hygiene  Community Resource Referral / Chronic Care Management: CRR required this visit?  No   CCM required this visit?  No      Plan:     I have personally reviewed and noted the following in the patient's chart:   Medical and social history Use of alcohol, tobacco or illicit drugs  Current medications and supplements including opioid prescriptions. Patient is not currently taking opioid prescriptions. Functional ability and status Nutritional status Physical activity Advanced directives List of other physicians Hospitalizations, surgeries, and ER visits in previous 12 months Vitals  Screenings to include cognitive, depression, and  falls Referrals and appointments  In addition, I have reviewed and discussed with patient certain preventive protocols, quality metrics, and best practice recommendations. A written personalized care plan for preventive services as well as general preventive health recommendations were provided to patient.     [name], LPN   03/16/8287   Nurse Notes: Pt states he is doing well. Recently lost wife due to dementia on 01/16/2021. Pt states "family sees him often and is helping him a lot." Colonoscopy no longer required due to age. Up to date on all vaccines. Pt aware he is overdue for eye exam.

## 2021-06-27 DIAGNOSIS — M9903 Segmental and somatic dysfunction of lumbar region: Secondary | ICD-10-CM | POA: Diagnosis not present

## 2021-06-27 DIAGNOSIS — M9902 Segmental and somatic dysfunction of thoracic region: Secondary | ICD-10-CM | POA: Diagnosis not present

## 2021-06-27 DIAGNOSIS — M5431 Sciatica, right side: Secondary | ICD-10-CM | POA: Diagnosis not present

## 2021-06-27 DIAGNOSIS — M9904 Segmental and somatic dysfunction of sacral region: Secondary | ICD-10-CM | POA: Diagnosis not present

## 2021-07-09 ENCOUNTER — Ambulatory Visit (INDEPENDENT_AMBULATORY_CARE_PROVIDER_SITE_OTHER): Payer: Medicare Other | Admitting: Nurse Practitioner

## 2021-07-09 ENCOUNTER — Encounter: Payer: Self-pay | Admitting: Nurse Practitioner

## 2021-07-09 ENCOUNTER — Other Ambulatory Visit: Payer: Self-pay

## 2021-07-09 VITALS — BP 123/80 | HR 88 | Temp 97.4°F | Resp 20 | Ht 72.0 in | Wt 220.0 lb

## 2021-07-09 DIAGNOSIS — M25511 Pain in right shoulder: Secondary | ICD-10-CM | POA: Insufficient documentation

## 2021-07-09 DIAGNOSIS — R319 Hematuria, unspecified: Secondary | ICD-10-CM | POA: Insufficient documentation

## 2021-07-09 LAB — URINALYSIS
Bilirubin, UA: NEGATIVE
Glucose, UA: NEGATIVE
Nitrite, UA: POSITIVE — AB
Specific Gravity, UA: 1.02 (ref 1.005–1.030)
Urobilinogen, Ur: 1 mg/dL (ref 0.2–1.0)
pH, UA: 6.5 (ref 5.0–7.5)

## 2021-07-09 MED ORDER — DICLOFENAC SODIUM 1 % EX GEL: 2.0000 g | Freq: Four times a day (QID) | CUTANEOUS | 1 refills | Status: DC

## 2021-07-09 MED ORDER — CEPHALEXIN 500 MG PO CAPS: 500.0000 mg | ORAL_CAPSULE | Freq: Two times a day (BID) | ORAL | 0 refills | Status: DC

## 2021-07-09 NOTE — Patient Instructions (Signed)
Shoulder Pain Many things can cause shoulder pain, including: An injury. Moving the shoulder in the same way again and again (overuse). Joint pain (arthritis). Pain can come from: Swelling and irritation (inflammation) of any part of the shoulder. An injury to the shoulder joint. An injury to: Tissues that connect muscle to bone (tendons). Tissues that connect bones to each other (ligaments). Bones. Follow these instructions at home: Watch for changes in your symptoms. Let your doctor know about them. Follow these instructions to help with your pain. If you have a sling: Wear the sling as told by your doctor. Remove it only as told by your doctor. Loosen the sling if your fingers: Tingle. Become numb. Turn cold and blue. Keep the sling clean. If the sling is not waterproof: Do not let it get wet. Take the sling off when you shower or bathe. Managing pain, stiffness, and swelling  If told, put ice on the painful area: Put ice in a plastic bag. Place a towel between your skin and the bag. Leave the ice on for 20 minutes, 2-3 times a day. Stop putting ice on if it does not help with the pain. Squeeze a soft ball or a foam pad as much as possible. This prevents swelling in the shoulder. It also helps to strengthen the arm. General instructions Take over-the-counter and prescription medicines only as told by your doctor. Keep all follow-up visits as told by your doctor. This is important. Contact a doctor if: Your pain gets worse. Medicine does not help your pain. You have new pain in your arm, hand, or fingers. Get help right away if: Your arm, hand, or fingers: Tingle. Are numb. Are swollen. Are painful. Turn white or blue. Summary Shoulder pain can be caused by many things. These include injury, moving the shoulder in the same away again and again, and joint pain. Watch for changes in your symptoms. Let your doctor know about them. This condition may be treated with a  sling, ice, and pain medicine. Contact your doctor if the pain gets worse or you have new pain. Get help right away if your arm, hand, or fingers tingle or get numb, swollen, or painful. Keep all follow-up visits as told by your doctor. This is important. This information is not intended to replace advice given to you by your health care provider. Make sure you discuss any questions you have with your health care provider. Document Revised: 04/27/2021 Document Reviewed: 04/27/2021 Elsevier Patient Education  2022 [place]. Hematuria, Adult Hematuria is blood in the urine. Blood may be visible in the urine, or it may be identified with a test. This condition can be caused by infections of the bladder, urethra, kidney, or prostate. Other possible causes include: Kidney stones. Cancer of the urinary tract. Too much calcium in the urine. Conditions that are passed from parent to child (inherited conditions). Exercise that requires a lot of energy. Infections can usually be treated with medicine, and a kidney stone usually will pass through your urine. If neither of these is the cause of your hematuria, more tests may be needed to identify the cause of your symptoms. It is very important to tell your health care provider about any blood in your urine, even if it is painless or the blood stops without treatment. Blood in the urine, when it happens and then stops and then happens again, can be a symptom of a very serious condition, including cancer. There is no pain in the initial stages of  many urinary cancers. Follow these instructions at home: Medicines Take over-the-counter and prescription medicines only as told by your health care provider. If you were prescribed an antibiotic medicine, take it as told by your health care provider. Do not stop taking the antibiotic even if you start to feel better. Eating and drinking Drink enough fluid to keep your urine pale yellow. It is recommended that  you drink 3-4 quarts (2.8-3.8 L) a day. If you have been diagnosed with an infection, drinking cranberry juice in addition to large amounts of water is recommended. Avoid caffeine, tea, and carbonated beverages. These tend to irritate the bladder. Avoid alcohol because it may irritate the prostate (in males). General instructions If you have been diagnosed with a kidney stone, follow your health care provider's instructions about straining your urine to catch the stone. Empty your bladder often. Avoid holding urine for long periods of time. If you are male: After a bowel movement, wipe from front to back and use each piece of toilet paper only once. Empty your bladder before and after sex. Pay attention to any changes in your symptoms. Tell your health care provider about any changes or any new symptoms. It is up to you to get the results of any tests. Ask your health care provider, or the department that is doing the test, when your results will be ready. Keep all follow-up visits. This is important. Contact a health care provider if: You develop back pain. You have a fever or chills. You have nausea or vomiting. Your symptoms do not improve after 3 days. Your symptoms get worse. Get help right away if: You develop severe vomiting and are unable to take medicine without vomiting. You develop severe pain in your back or abdomen even though you are taking medicine. You pass a large amount of blood in your urine. You pass blood clots in your urine. You feel very weak or like you might faint. You faint. Summary Hematuria is blood in the urine. It has many possible causes. It is very important that you tell your health care provider about any blood in your urine, even if it is painless or the blood stops without treatment. Take over-the-counter and prescription medicines only as told by your health care provider. Drink enough fluid to keep your urine pale yellow. This information is not  intended to replace advice given to you by your health care provider. Make sure you discuss any questions you have with your health care provider. Document Revised: 04/12/2020 Document Reviewed: 04/12/2020 Elsevier Patient Education  2022 [employer].

## 2021-07-09 NOTE — Progress Notes (Signed)
Acute Office Visit  Subjective:    Patient ID: Gregory Pearson, male    DOB: 1927/12/05, 87 y.o.   MRN: [id number]  Chief Complaint  Patient presents with   Hematuria    Hematuria This is a recurrent problem. The current episode started more than 1 month ago. The problem has been gradually worsening since onset. He reports no clotting in his urine stream. His pain is at a severity of 5/10. He is experiencing no pain. Urine color: bright red. Irritative symptoms include frequency and urgency. Pertinent negatives include no abdominal pain, chills or fever. He is not sexually active.  Shoulder Pain  The pain is present in the right shoulder. This is a recurrent problem. The current episode started more than 1 month ago. There has been no history of extremity trauma. The problem occurs constantly. The quality of the pain is described as aching. The pain is at a severity of 5/10. Pertinent negatives include no fever. The symptoms are aggravated by activity. He has tried acetaminophen for the symptoms. The treatment provided mild relief.    Past Medical History:  Diagnosis Date   Asbestos exposure    Blood transfusion without reported diagnosis    after ruptured appendix 1963   Diverticulosis    Gastric ulcer 06/2019   GERD (gastroesophageal reflux disease)    History of stomach ulcers 1995   H.pylori s/p treatment   History of stomach ulcers    Hyperlipidemia    Muscle strain of right gluteal region 01/24/2014   Prostate cancer ([place]) 2002   prostatectomy   Traumatic hematoma of buttock 01/24/2014    Past Surgical History:  Procedure Laterality Date   APPENDECTOMY  1963   ruptured appendix/gangrene   BIOPSY  07/06/2019   Procedure: BIOPSY;  Surgeon: Daneil Dolin, MD;  [facility]: [facility];  Service: Endoscopy;;   COLONOSCOPY  2013   Dr. Fuller Plan: multiple colon polyps (tubular adenomas), diverticulosis. no further surveillance colonoscopies due to age.   ESOPHAGOGASTRODUODENOSCOPY   [id number]   Dr. Fuller Plan: gastric ulcer (h.pylori + s/p tx), follow up EGD verified ulcer healing   ESOPHAGOGASTRODUODENOSCOPY N/A 09/26/2016   Procedure: ESOPHAGOGASTRODUODENOSCOPY (EGD);  Surgeon: Daneil Dolin, MD;  [facility]: [facility];  Service: Endoscopy;  Laterality: N/A;   ESOPHAGOGASTRODUODENOSCOPY N/A 01/23/2017   Procedure: ESOPHAGOGASTRODUODENOSCOPY (EGD);  Surgeon: Daneil Dolin, MD;  [facility]: [facility];  Service: Endoscopy;  Laterality: N/A;  12:15pm   ESOPHAGOGASTRODUODENOSCOPY N/A 07/06/2019   Procedure: ESOPHAGOGASTRODUODENOSCOPY (EGD);  Surgeon: Daneil Dolin, MD; normal esophagus, nonbleeding gastric ulcer with pigmented material, abnormal gastric mucosa of uncertain significance s/p biopsy, normal duodenum.  Pathology with mild reactive gastropathy, mild chronic gastritis, H. pylori negative.   ESOPHAGOGASTRODUODENOSCOPY N/A 10/20/2019   Procedure: ESOPHAGOGASTRODUODENOSCOPY (EGD);  Surgeon: Daneil Dolin, MD;  Location: [facility];  Service: Endoscopy;  Laterality: N/A;  9:30am   [place]   LAPAROSCOPIC CHOLECYSTECTOMY  2003   PROSTATECTOMY  2002    Family History  Problem Relation Age of Onset   Prostate cancer Brother    Prostate cancer Brother    Suicidality Brother    Prostate cancer Brother    Stomach cancer Neg Hx    Colon cancer Neg Hx     Social History   Socioeconomic History   Marital status: Widowed    Spouse name: Not on file   Number of children: 3   Years of education: Not on file   Highest education level: Not  on file  Occupational History   Occupation: retired Conservation officer, historic buildings power  Tobacco Use   Smoking status: Never   Smokeless tobacco: Never  Vaping Use   Vaping Use: Never used  Substance and Sexual Activity   Alcohol use: Not Currently    Comment: last drank about 15 years ago   Drug use: Never   Sexual activity: Not on file  Other Topics Concern   Not on file  Social History Narrative   3 children.   Widowed  since 01/16/2021. Married x 61 years.    Social Determinants of Health   Financial Resource Strain: Low Risk    Difficulty of Paying Living Expenses: Not hard at all  Food Insecurity: No Food Insecurity   Worried About Charity fundraiser in the Last Year: Never true   [place] in the Last Year: Never true  Transportation Needs: No Transportation Needs   Lack of Transportation (Medical): No   Lack of Transportation (Non-Medical): No  Physical Activity: Sufficiently Active   Days of Exercise per Week: 5 days   Minutes of Exercise per Session: 30 min  Stress: No Stress Concern Present   Feeling of Stress : Not at all  Social Connections: Moderately Integrated   Frequency of Communication with Friends and Family: More than three times a week   Frequency of Social Gatherings with Friends and Family: More than three times a week   Attends Religious Services: 1 to 4 times per year   Active Member of [employer] or Organizations: Yes   Attends Archivist Meetings: 1 to 4 times per year   Marital Status: Widowed  Human resources officer Violence: Not At Risk   Fear of Current or Ex-Partner: No   Emotionally Abused: No   Physically Abused: No   Sexually Abused: No    Outpatient Medications Prior to Visit  Medication Sig Dispense Refill   acetaminophen (TYLENOL) 325 MG tablet Take 2 tablets (650 mg total) by mouth every 6 (six) hours as needed for mild pain (or Fever >/= 101). (Patient taking differently: Take 650 mg by mouth every 6 (six) hours as needed for mild pain or headache (or Fever >/= 101).)     nabumetone (RELAFEN) 500 MG tablet TAKE 2 TABLETS BY MOUTH  TWICE DAILY FOR MUSCLE AND  JOINT PAIN (Patient taking differently: TAKE 2 TABLETS BY MOUTH  TWICE DAILY FOR MUSCLE AND  JOINT PAIN) 360 tablet 3   Multiple Vitamin (MULTIVITAMIN) tablet Take 1 tablet by mouth daily. (Patient not taking: Reported on 07/09/2021)     Omega-3 1000 MG CAPS Take 2,000 mg by mouth daily. (Patient not  taking: Reported on 07/09/2021)     pantoprazole (PROTONIX) 40 MG tablet pantoprazole 40 mg tablet,delayed release  TAKE 1 TABLET BY MOUTH TWICE A DAY (Patient not taking: Reported on 07/09/2021)     cefdinir (OMNICEF) 300 MG capsule Take 1 capsule (300 mg total) by mouth 2 (two) times daily. 1 po BID 20 capsule 0   No facility-administered medications prior to visit.     Review of Systems  Constitutional:  Negative for chills and fever.  Eyes: Negative.   Gastrointestinal:  Negative for abdominal pain.  Genitourinary:  Positive for frequency, hematuria and urgency.  Skin: Negative.  Negative for rash.  Neurological: Negative.   All other systems reviewed and are negative.     Objective:    Physical Exam Vitals and nursing note reviewed.  Constitutional:      Appearance:  Normal appearance.  HENT:     Head: Normocephalic.     Nose: Nose normal.     Mouth/Throat:     Mouth: Mucous membranes are moist.     Pharynx: Oropharynx is clear.  Eyes:     Conjunctiva/sclera: Conjunctivae normal.  Cardiovascular:     Rate and Rhythm: Normal rate and regular rhythm.  Pulmonary:     Effort: Pulmonary effort is normal.     Breath sounds: Normal breath sounds.  Abdominal:     General: Bowel sounds are normal.     Tenderness: There is no abdominal tenderness. There is no right CVA tenderness or left CVA tenderness.  Musculoskeletal:     Right shoulder: Tenderness present. No crepitus. Normal range of motion.  Skin:    General: Skin is warm.     Findings: No rash.  Neurological:     Mental Status: He is alert.  Psychiatric:        Behavior: Behavior normal.    BP 123/80   Pulse 88   Temp (!) 97.4 F (36.3 C)   Resp 20   Ht 6' (1.829 m)   Wt 220 lb (99.8 kg)   SpO2 97%   BMI 29.84 kg/m  Wt Readings from Last 3 Encounters:  07/09/21 220 lb (99.8 kg)  06/25/21 215 lb (97.5 kg)  05/17/21 230 lb (104.3 kg)      Lab Results  Component Value Date   TSH 1.330 07/07/2014    Lab Results  Component Value Date   WBC 9.0 05/04/2021   HGB 15.9 05/04/2021   HCT 48.2 05/04/2021   MCV 94 05/04/2021   PLT 151 05/04/2021   Lab Results  Component Value Date   NA 143 05/04/2021   K 4.6 05/04/2021   CO2 25 05/04/2021   GLUCOSE 100 (H) 05/04/2021   BUN 12 05/04/2021   CREATININE 0.75 (L) 05/04/2021   BILITOT 0.4 05/04/2021   ALKPHOS 120 05/04/2021   AST 13 05/04/2021   ALT 12 05/04/2021   PROT 6.7 05/04/2021   ALBUMIN 4.6 05/04/2021   CALCIUM 9.1 05/04/2021   ANIONGAP 6 07/07/2019   EGFR 85 05/04/2021   Lab Results  Component Value Date   CHOL 187 05/04/2021   Lab Results  Component Value Date   HDL 61 05/04/2021   Lab Results  Component Value Date   LDLCALC 109 (H) 05/04/2021   Lab Results  Component Value Date   TRIG 92 05/04/2021   Lab Results  Component Value Date   CHOLHDL 3.1 05/04/2021   No results found for: HGBA1C     Assessment & Plan:   Problem List Items Addressed This Visit       Other   Hematuria - Primary    Hematuria in the past 24 hours.  Symptoms not well controlled.  Urinalysis completed positive for blood, nitrites and leukocytes.  Started patient on Keflex 500 mg tablet by mouth twice daily increase hydration,  completed cultures results pending.  Rx sent to pharmacy.  Follow        Relevant Medications   cephALEXin (KEFLEX) 500 MG capsule   Other Relevant Orders   Urinalysis   Urine Culture   Acute pain of right shoulder    Uncontrolled right shoulder pain symptoms started in the past few days after exercising.  Voltaren gel recommended.  Ice pack, rest joint, follow-up with worsening unresolved symptoms.  Rx sent to pharmacy      Relevant Medications   diclofenac  Sodium (VOLTAREN) 1 % GEL     Meds ordered this encounter  Medications   cephALEXin (KEFLEX) 500 MG capsule    Sig: Take 1 capsule (500 mg total) by mouth 2 (two) times daily.    Dispense:  14 capsule    Refill:  0    Order Specific  Question:   Supervising Provider    Answer:   Claretta Fraise [[id number]]   diclofenac Sodium (VOLTAREN) 1 % GEL    Sig: Apply 2 g topically 4 (four) times daily.    Dispense:  50 g    Refill:  1    Order Specific Question:   Supervising Provider    Answer:   Claretta Fraise [[id number]]     Ivy Lynn, NP

## 2021-07-09 NOTE — Assessment & Plan Note (Signed)
Hematuria in the past 24 hours.  Symptoms not well controlled.  Urinalysis completed positive for blood, nitrites and leukocytes.  Started patient on Keflex 500 mg tablet by mouth twice daily increase hydration,  completed cultures results pending.  Rx sent to pharmacy.  Follow

## 2021-07-09 NOTE — Assessment & Plan Note (Signed)
Uncontrolled right shoulder pain symptoms started in the past few days after exercising.  Voltaren gel recommended.  Ice pack, rest joint, follow-up with worsening unresolved symptoms.  Rx sent to pharmacy

## 2021-07-11 DIAGNOSIS — M9904 Segmental and somatic dysfunction of sacral region: Secondary | ICD-10-CM | POA: Diagnosis not present

## 2021-07-11 DIAGNOSIS — M5431 Sciatica, right side: Secondary | ICD-10-CM | POA: Diagnosis not present

## 2021-07-11 DIAGNOSIS — M9903 Segmental and somatic dysfunction of lumbar region: Secondary | ICD-10-CM | POA: Diagnosis not present

## 2021-07-11 DIAGNOSIS — M9902 Segmental and somatic dysfunction of thoracic region: Secondary | ICD-10-CM | POA: Diagnosis not present

## 2021-07-14 LAB — URINE CULTURE

## 2021-07-15 ENCOUNTER — Other Ambulatory Visit: Payer: Self-pay | Admitting: Nurse Practitioner

## 2021-07-15 MED ORDER — AMOXICILLIN-POT CLAVULANATE 875-125 MG PO TABS: 1.0000 | ORAL_TABLET | Freq: Two times a day (BID) | ORAL | 0 refills | Status: DC

## 2021-07-16 NOTE — Progress Notes (Signed)
Returning call.

## 2021-07-23 ENCOUNTER — Other Ambulatory Visit: Payer: Self-pay | Admitting: Family Medicine

## 2021-07-25 DIAGNOSIS — M9903 Segmental and somatic dysfunction of lumbar region: Secondary | ICD-10-CM | POA: Diagnosis not present

## 2021-07-25 DIAGNOSIS — M9904 Segmental and somatic dysfunction of sacral region: Secondary | ICD-10-CM | POA: Diagnosis not present

## 2021-07-25 DIAGNOSIS — M5431 Sciatica, right side: Secondary | ICD-10-CM | POA: Diagnosis not present

## 2021-07-25 DIAGNOSIS — M9902 Segmental and somatic dysfunction of thoracic region: Secondary | ICD-10-CM | POA: Diagnosis not present

## 2021-08-15 DIAGNOSIS — M9903 Segmental and somatic dysfunction of lumbar region: Secondary | ICD-10-CM | POA: Diagnosis not present

## 2021-08-15 DIAGNOSIS — M9904 Segmental and somatic dysfunction of sacral region: Secondary | ICD-10-CM | POA: Diagnosis not present

## 2021-08-15 DIAGNOSIS — M9902 Segmental and somatic dysfunction of thoracic region: Secondary | ICD-10-CM | POA: Diagnosis not present

## 2021-08-15 DIAGNOSIS — M5431 Sciatica, right side: Secondary | ICD-10-CM | POA: Diagnosis not present

## 2021-08-20 DIAGNOSIS — H52223 Regular astigmatism, bilateral: Secondary | ICD-10-CM | POA: Diagnosis not present

## 2021-08-20 DIAGNOSIS — H524 Presbyopia: Secondary | ICD-10-CM | POA: Diagnosis not present

## 2021-08-20 DIAGNOSIS — H2513 Age-related nuclear cataract, bilateral: Secondary | ICD-10-CM | POA: Diagnosis not present

## 2021-08-20 DIAGNOSIS — H43813 Vitreous degeneration, bilateral: Secondary | ICD-10-CM | POA: Diagnosis not present

## 2021-08-20 DIAGNOSIS — H5203 Hypermetropia, bilateral: Secondary | ICD-10-CM | POA: Diagnosis not present

## 2021-09-03 ENCOUNTER — Encounter: Payer: Self-pay | Admitting: Family Medicine

## 2021-09-03 ENCOUNTER — Ambulatory Visit: Payer: Medicare Other | Admitting: Nurse Practitioner

## 2021-09-04 ENCOUNTER — Ambulatory Visit (INDEPENDENT_AMBULATORY_CARE_PROVIDER_SITE_OTHER): Payer: Medicare Other

## 2021-09-04 ENCOUNTER — Ambulatory Visit (INDEPENDENT_AMBULATORY_CARE_PROVIDER_SITE_OTHER): Payer: Medicare Other | Admitting: Family Medicine

## 2021-09-04 ENCOUNTER — Encounter: Payer: Self-pay | Admitting: Family Medicine

## 2021-09-04 ENCOUNTER — Other Ambulatory Visit: Payer: Self-pay

## 2021-09-04 VITALS — BP 139/78 | HR 70 | Temp 96.8°F | Ht 72.0 in | Wt 215.8 lb

## 2021-09-04 DIAGNOSIS — M25511 Pain in right shoulder: Secondary | ICD-10-CM

## 2021-09-04 DIAGNOSIS — M1711 Unilateral primary osteoarthritis, right knee: Secondary | ICD-10-CM | POA: Diagnosis not present

## 2021-09-04 DIAGNOSIS — R351 Nocturia: Secondary | ICD-10-CM | POA: Diagnosis not present

## 2021-09-04 DIAGNOSIS — M19011 Primary osteoarthritis, right shoulder: Secondary | ICD-10-CM | POA: Diagnosis not present

## 2021-09-04 LAB — URINALYSIS
Bilirubin, UA: NEGATIVE
Glucose, UA: NEGATIVE
Nitrite, UA: POSITIVE — AB
Specific Gravity, UA: 1.02 (ref 1.005–1.030)
Urobilinogen, Ur: 0.2 mg/dL (ref 0.2–1.0)
pH, UA: 6.5 (ref 5.0–7.5)

## 2021-09-04 MED ORDER — AMOXICILLIN-POT CLAVULANATE 875-125 MG PO TABS: 1.0000 | ORAL_TABLET | Freq: Two times a day (BID) | ORAL | 0 refills | Status: DC

## 2021-09-04 MED ORDER — PREDNISONE 10 MG PO TABS: ORAL_TABLET | ORAL | 0 refills | Status: DC

## 2021-09-04 NOTE — Patient Instructions (Signed)
Please do not take the nabumetone until you finish the prednisone.

## 2021-09-04 NOTE — Progress Notes (Signed)
Subjective:  Patient ID: Gregory Pearson, male    DOB: [birth year]  Age: 88 y.o. MRN: [id number]  CC: Knee Problem (Pain and popping)   HPI Gregory Pearson presents for up to bathroom 4X a night. Onset 1 week ago. Prior to that about twice a night. Had prostatectomy 25 years ago. Denies dysuria  Right knee popping. Denies pain and instability. Doing an exercise regimen for strength. This helped him to get off of a walker & cane. Dances regularly. Wants to make sure this won't slow him down.   Right shoulder pain for 2 months. No relief with nabumetone.Hurts for abduction overhead when lifting 2-3 lb weight at exercise class.  Depression screen [facility] 2/9 09/04/2021 07/09/2021 06/25/2021  Decreased Interest 0 0 0  Down, Depressed, Hopeless 0 0 0  PHQ - 2 Score 0 0 0  Altered sleeping - - -  Tired, decreased energy - - -  Change in appetite - - -  Feeling bad or failure about yourself  - - -  Trouble concentrating - - -  Moving slowly or fidgety/restless - - -  Suicidal thoughts - - -    History Gregory Pearson has a past medical history of Asbestos exposure, Blood transfusion without reported diagnosis, Diverticulosis, Gastric ulcer (06/2019), GERD (gastroesophageal reflux disease), History of stomach ulcers (1995), History of stomach ulcers, Hyperlipidemia, Muscle strain of right gluteal region (01/24/2014), Prostate cancer ([place]) (2002), and Traumatic hematoma of buttock (01/24/2014).   Gregory Pearson has a past surgical history that includes Appendectomy (1963); Hemorrhoid surgery (1970); Prostatectomy (2002); Laparoscopic cholecystectomy (2003); Colonoscopy (2013); Esophagogastroduodenoscopy (4580,9983); Esophagogastroduodenoscopy (N/A, 09/26/2016); Esophagogastroduodenoscopy (N/A, 01/23/2017); Esophagogastroduodenoscopy (N/A, 07/06/2019); biopsy (07/06/2019); and Esophagogastroduodenoscopy (N/A, 10/20/2019).   His family history includes Prostate cancer in his brother, brother, and brother; Suicidality in his brother.Gregory Pearson  reports that Gregory Pearson has never smoked. Gregory Pearson has never used smokeless tobacco. Gregory Pearson reports that Gregory Pearson does not currently use alcohol. Gregory Pearson reports that Gregory Pearson does not use drugs.    ROS Review of Systems  Constitutional:  Negative for activity change, appetite change and fever.  Respiratory:  Negative for shortness of breath.   Cardiovascular:  Negative for chest pain.  Musculoskeletal:  Negative for arthralgias.  Skin:  Negative for rash.   Objective:  BP 139/78    Pulse 70    Temp (!) 96.8 F (36 C)    Ht 6' (1.829 m)    Wt 215 lb 12.8 oz (97.9 kg)    SpO2 97%    BMI 29.27 kg/m   BP Readings from Last 3 Encounters:  09/04/21 139/78  07/09/21 123/80  05/17/21 122/75    Wt Readings from Last 3 Encounters:  09/04/21 215 lb 12.8 oz (97.9 kg)  07/09/21 220 lb (99.8 kg)  06/25/21 215 lb (97.5 kg)     Physical Exam Vitals reviewed.  Constitutional:      Appearance: Gregory Pearson is well-developed.  HENT:     Head: Normocephalic and atraumatic.     Right Ear: External ear normal.     Left Ear: External ear normal.     Mouth/Throat:     Pharynx: No oropharyngeal exudate or posterior oropharyngeal erythema.  Eyes:     Pupils: Pupils are equal, round, and reactive to light.  Cardiovascular:     Rate and Rhythm: Normal rate and regular rhythm.     Heart sounds: No murmur heard. Pulmonary:     Effort: No respiratory distress.     Breath sounds: Normal breath sounds.  Musculoskeletal:  General: Tenderness (with crepitance, right knee for ROM.) present.     Cervical back: Normal range of motion and neck supple.     Right knee: Crepitus present. No swelling, effusion or erythema. Abnormal meniscus (Positive McMurray at lateral aspect). Normal alignment.     Instability Tests: Anterior Lachman test negative. Lateral McMurray test positive.     Left knee: Normal. No crepitus.  Neurological:     Mental Status: Gregory Pearson is alert and oriented to person, place, and time.      Assessment & Plan:   Gregory Pearson  was seen today for knee problem.  Diagnoses and all orders for this visit:  Acute pain of right shoulder -     DG Shoulder Right; Future  Arthritis of right knee -     DG Knee 1-2 Views Right; Future  Nocturia -     Urinalysis -     Urine Culture  Other orders -     amoxicillin-clavulanate (AUGMENTIN) 875-125 MG tablet; Take 1 tablet by mouth 2 (two) times daily. Take all of this medication -     predniSONE (DELTASONE) 10 MG tablet; Take 5 daily for 3 days followed by 4,3,2 and 1 for 3 days each.       I have discontinued Gregory Pearson's amoxicillin-clavulanate. I am also having him start on amoxicillin-clavulanate and predniSONE. Additionally, I am having him maintain his multivitamin, Omega-3, acetaminophen, pantoprazole, diclofenac Sodium, and nabumetone.  Allergies as of 09/04/2021   No Known Allergies      Medication List        Accurate as of September 04, 2021  8:28 AM. If you have any questions, ask your nurse or doctor.          acetaminophen 325 MG tablet Commonly known as: TYLENOL Take 2 tablets (650 mg total) by mouth every 6 (six) hours as needed for mild pain (or Fever >/= 101). What changed: reasons to take this   amoxicillin-clavulanate 875-125 MG tablet Commonly known as: AUGMENTIN Take 1 tablet by mouth 2 (two) times daily. Take all of this medication What changed: additional instructions Changed by: Claretta Fraise, MD   diclofenac Sodium 1 % Gel Commonly known as: VOLTAREN Apply 2 g topically 4 (four) times daily.   multivitamin tablet Take 1 tablet by mouth daily.   nabumetone 500 MG tablet Commonly known as: RELAFEN TAKE 2 TABLETS BY MOUTH  TWICE DAILY FOR MUSCLE AND  JOINT PAIN   Omega-3 1000 MG Caps Take 2,000 mg by mouth daily.   pantoprazole 40 MG tablet Commonly known as: PROTONIX   predniSONE 10 MG tablet Commonly known as: DELTASONE Take 5 daily for 3 days followed by 4,3,2 and 1 for 3 days each. Started by: Claretta Fraise,  MD         Follow-up: Return if symptoms worsen or fail to improve.  Claretta Fraise, M.D.

## 2021-09-05 DIAGNOSIS — M9902 Segmental and somatic dysfunction of thoracic region: Secondary | ICD-10-CM | POA: Diagnosis not present

## 2021-09-05 DIAGNOSIS — M9903 Segmental and somatic dysfunction of lumbar region: Secondary | ICD-10-CM | POA: Diagnosis not present

## 2021-09-05 DIAGNOSIS — M9904 Segmental and somatic dysfunction of sacral region: Secondary | ICD-10-CM | POA: Diagnosis not present

## 2021-09-05 DIAGNOSIS — M5431 Sciatica, right side: Secondary | ICD-10-CM | POA: Diagnosis not present

## 2021-09-06 LAB — URINE CULTURE

## 2021-09-26 DIAGNOSIS — M5431 Sciatica, right side: Secondary | ICD-10-CM | POA: Diagnosis not present

## 2021-09-26 DIAGNOSIS — M9904 Segmental and somatic dysfunction of sacral region: Secondary | ICD-10-CM | POA: Diagnosis not present

## 2021-09-26 DIAGNOSIS — M9903 Segmental and somatic dysfunction of lumbar region: Secondary | ICD-10-CM | POA: Diagnosis not present

## 2021-09-26 DIAGNOSIS — M9902 Segmental and somatic dysfunction of thoracic region: Secondary | ICD-10-CM | POA: Diagnosis not present

## 2021-10-07 ENCOUNTER — Other Ambulatory Visit: Payer: Self-pay | Admitting: Family Medicine

## 2021-10-17 DIAGNOSIS — M9903 Segmental and somatic dysfunction of lumbar region: Secondary | ICD-10-CM | POA: Diagnosis not present

## 2021-10-17 DIAGNOSIS — M5431 Sciatica, right side: Secondary | ICD-10-CM | POA: Diagnosis not present

## 2021-10-17 DIAGNOSIS — M9902 Segmental and somatic dysfunction of thoracic region: Secondary | ICD-10-CM | POA: Diagnosis not present

## 2021-10-17 DIAGNOSIS — M9904 Segmental and somatic dysfunction of sacral region: Secondary | ICD-10-CM | POA: Diagnosis not present

## 2021-11-01 DIAGNOSIS — M25562 Pain in left knee: Secondary | ICD-10-CM | POA: Diagnosis not present

## 2021-11-01 DIAGNOSIS — M25561 Pain in right knee: Secondary | ICD-10-CM | POA: Diagnosis not present

## 2021-11-07 DIAGNOSIS — M9904 Segmental and somatic dysfunction of sacral region: Secondary | ICD-10-CM | POA: Diagnosis not present

## 2021-11-07 DIAGNOSIS — M9903 Segmental and somatic dysfunction of lumbar region: Secondary | ICD-10-CM | POA: Diagnosis not present

## 2021-11-07 DIAGNOSIS — M9902 Segmental and somatic dysfunction of thoracic region: Secondary | ICD-10-CM | POA: Diagnosis not present

## 2021-11-07 DIAGNOSIS — M5431 Sciatica, right side: Secondary | ICD-10-CM | POA: Diagnosis not present

## 2021-11-14 ENCOUNTER — Encounter: Payer: Self-pay | Admitting: Nurse Practitioner

## 2021-11-14 ENCOUNTER — Ambulatory Visit (INDEPENDENT_AMBULATORY_CARE_PROVIDER_SITE_OTHER): Payer: Medicare Other | Admitting: Nurse Practitioner

## 2021-11-14 VITALS — BP 134/87 | HR 78 | Temp 98.3°F | Ht 72.0 in | Wt 222.0 lb

## 2021-11-14 DIAGNOSIS — M5489 Other dorsalgia: Secondary | ICD-10-CM

## 2021-11-14 DIAGNOSIS — M25561 Pain in right knee: Secondary | ICD-10-CM | POA: Diagnosis not present

## 2021-11-14 DIAGNOSIS — M25511 Pain in right shoulder: Secondary | ICD-10-CM | POA: Diagnosis not present

## 2021-11-14 DIAGNOSIS — M25562 Pain in left knee: Secondary | ICD-10-CM | POA: Diagnosis not present

## 2021-11-14 MED ORDER — DICLOFENAC SODIUM 1 % EX GEL: 2.0000 g | Freq: Four times a day (QID) | CUTANEOUS | 1 refills | Status: DC

## 2021-11-14 MED ORDER — ACETAMINOPHEN 500 MG PO TABS: 500.0000 mg | ORAL_TABLET | Freq: Four times a day (QID) | ORAL | 0 refills | Status: AC | PRN

## 2021-11-14 NOTE — Progress Notes (Signed)
? ?Acute Office Visit ? ?Subjective:  ? ? Patient ID: Gregory Pearson, male    DOB: [birth year], 88 y.o.   MRN: [id number] ? ?Chief Complaint  ?Patient presents with  ? right low back pain  ? ? ?Back Pain ?This is a new problem. The current episode started yesterday. The problem occurs constantly. The problem is unchanged. The pain is present in the lumbar spine. The pain is at a severity of 5/10. The symptoms are aggravated by bending and position. Stiffness is present All day. Pertinent negatives include no chest pain, numbness, paresis or paresthesias. He has tried ice for the symptoms.  ? ? ? ?Past Medical History:  ?Diagnosis Date  ? Asbestos exposure   ? Blood transfusion without reported diagnosis   ? after ruptured appendix 1963  ? Diverticulosis   ? Gastric ulcer 06/2019  ? GERD (gastroesophageal reflux disease)   ? History of stomach ulcers 1995  ? H.pylori s/p treatment  ? History of stomach ulcers   ? Hyperlipidemia   ? Muscle strain of right gluteal region 01/24/2014  ? Prostate cancer [facility]) 2002  ? prostatectomy  ? Traumatic hematoma of buttock 01/24/2014  ? ? ?Past Surgical History:  ?Procedure Laterality Date  ? APPENDECTOMY  1963  ? ruptured appendix/gangrene  ? BIOPSY  07/06/2019  ? Procedure: BIOPSY;  Surgeon: Daneil Dolin, MD;  [facility]: [facility];  Service: Endoscopy;;  ? COLONOSCOPY  2013  ? Dr. Fuller Plan: multiple colon polyps (tubular adenomas), diverticulosis. no further surveillance colonoscopies due to age.  ? ESOPHAGOGASTRODUODENOSCOPY  [id number]  ? Dr. Fuller Plan: gastric ulcer (h.pylori + s/p tx), follow up EGD verified ulcer healing  ? ESOPHAGOGASTRODUODENOSCOPY N/A 09/26/2016  ? Procedure: ESOPHAGOGASTRODUODENOSCOPY (EGD);  Surgeon: Daneil Dolin, MD;  [facility]: [facility];  Service: Endoscopy;  Laterality: N/A;  ? ESOPHAGOGASTRODUODENOSCOPY N/A 01/23/2017  ? Procedure: ESOPHAGOGASTRODUODENOSCOPY (EGD);  Surgeon: Daneil Dolin, MD;  [facility]: [facility];  Service: Endoscopy;  Laterality:  N/A;  12:15pm  ? ESOPHAGOGASTRODUODENOSCOPY N/A 07/06/2019  ? Procedure: ESOPHAGOGASTRODUODENOSCOPY (EGD);  Surgeon: Daneil Dolin, MD; normal esophagus, nonbleeding gastric ulcer with pigmented material, abnormal gastric mucosa of uncertain significance s/p biopsy, normal duodenum.  Pathology with mild reactive gastropathy, mild chronic gastritis, H. pylori negative.  ? ESOPHAGOGASTRODUODENOSCOPY N/A 10/20/2019  ? Procedure: ESOPHAGOGASTRODUODENOSCOPY (EGD);  Surgeon: Daneil Dolin, MD;  Location: [facility];  Service: Endoscopy;  Laterality: N/A;  9:30am  ? [place]  ? LAPAROSCOPIC CHOLECYSTECTOMY  2003  ? PROSTATECTOMY  2002  ? ? ?Family History  ?Problem Relation Age of Onset  ? Prostate cancer Brother   ? Prostate cancer Brother   ? Suicidality Brother   ? Prostate cancer Brother   ? Stomach cancer Neg Hx   ? Colon cancer Neg Hx   ? ? ?Social History  ? ?Socioeconomic History  ? Marital status: Widowed  ?  Spouse name: Not on file  ? Number of children: 3  ? Years of education: Not on file  ? Highest education level: Not on file  ?Occupational History  ? Occupation: retired Conservation officer, historic buildings power  ?Tobacco Use  ? Smoking status: Never  ? Smokeless tobacco: Never  ?Vaping Use  ? Vaping Use: Never used  ?Substance and Sexual Activity  ? Alcohol use: Not Currently  ?  Comment: last drank about 15 years ago  ? Drug use: Never  ? Sexual activity: Not on file  ?Other Topics Concern  ? Not on file  ?  Social History Narrative  ? 3 children.  ? Widowed since 01/16/2021. Married x 61 years.   ? ?Social Determinants of Health  ? ?Financial Resource Strain: Low Risk   ? Difficulty of Paying Living Expenses: Not hard at all  ?Food Insecurity: No Food Insecurity  ? Worried About Charity fundraiser in the Last Year: Never true  ? Ran Out of Food in the Last Year: Never true  ?Transportation Needs: No Transportation Needs  ? Lack of Transportation (Medical): No  ? Lack of Transportation (Non-Medical): No  ?Physical  Activity: Sufficiently Active  ? Days of Exercise per Week: 5 days  ? Minutes of Exercise per Session: 30 min  ?Stress: No Stress Concern Present  ? Feeling of Stress : Not at all  ?Social Connections: Moderately Integrated  ? Frequency of Communication with Friends and Family: More than three times a week  ? Frequency of Social Gatherings with Friends and Family: More than three times a week  ? Attends Religious Services: 1 to 4 times per year  ? Active Member of Clubs or Organizations: Yes  ? Attends Archivist Meetings: 1 to 4 times per year  ? Marital Status: Widowed  ?Intimate Partner Violence: Not At Risk  ? Fear of Current or Ex-Partner: No  ? Emotionally Abused: No  ? Physically Abused: No  ? Sexually Abused: No  ? ? ?Outpatient Medications Prior to Visit  ?Medication Sig Dispense Refill  ? amoxicillin-clavulanate (AUGMENTIN) 875-125 MG tablet Take 1 tablet by mouth 2 (two) times daily. Take all of this medication 20 tablet 0  ? Multiple Vitamin (MULTIVITAMIN) tablet Take 1 tablet by mouth daily.    ? nabumetone (RELAFEN) 500 MG tablet TAKE 2 TABLETS BY MOUTH TWICE  DAILY FOR MUSCLE AND JOINT PAIN 360 tablet 0  ? Omega-3 1000 MG CAPS Take 2,000 mg by mouth daily.    ? pantoprazole (PROTONIX) 40 MG tablet     ? predniSONE (DELTASONE) 10 MG tablet Take 5 daily for 3 days followed by 4,3,2 and 1 for 3 days each. 45 tablet 0  ? acetaminophen (TYLENOL) 325 MG tablet Take 2 tablets (650 mg total) by mouth every 6 (six) hours as needed for mild pain (or Fever >/= 101). (Patient taking differently: Take 650 mg by mouth every 6 (six) hours as needed for mild pain or headache (or Fever >/= 101).)    ? diclofenac Sodium (VOLTAREN) 1 % GEL Apply 2 g topically 4 (four) times daily. 50 g 1  ? ?No facility-administered medications prior to visit.  ? ? ?No Known Allergies ? ?Review of Systems  ?Constitutional: Negative.   ?HENT: Negative.    ?Respiratory: Negative.    ?Cardiovascular:  Negative for chest pain and  palpitations.  ?Gastrointestinal:  Negative for nausea.  ?Genitourinary: Negative.   ?Musculoskeletal:  Positive for back pain.  ?Neurological:  Negative for numbness and paresthesias.  ?All other systems reviewed and are negative. ? ?   ?Objective:  ?  ?Physical Exam ?Vitals and nursing note reviewed.  ?Constitutional:   ?   Appearance: He is normal weight.  ?HENT:  ?   Head: Normocephalic.  ?   Right Ear: External ear normal.  ?   Left Ear: External ear normal.  ?   Nose: Nose normal.  ?   Mouth/Throat:  ?   Mouth: Mucous membranes are moist.  ?   Pharynx: Oropharynx is clear.  ?Eyes:  ?   Conjunctiva/sclera: Conjunctivae normal.  ?Cardiovascular:  ?  Rate and Rhythm: Normal rate and regular rhythm.  ?   Pulses: Normal pulses.  ?   Heart sounds: Normal heart sounds.  ?Pulmonary:  ?   Effort: Pulmonary effort is normal.  ?   Breath sounds: Normal breath sounds.  ?Abdominal:  ?   General: Bowel sounds are normal.  ?Musculoskeletal:  ?   Lumbar back: Tenderness present. Decreased range of motion.  ?     Back: ? ?   Comments: Tenderness and pain lower back   ?Skin: ?   General: Skin is warm.  ? ?    ?Neurological:  ?   Mental Status: He is oriented to person, place, and time.  ?Psychiatric:     ?   Behavior: Behavior normal.  ? ? ?BP 134/87   Pulse 78   Temp 98.3 ?F (36.8 ?C)   Ht 6' (1.829 m)   Wt 222 lb (100.7 kg)   SpO2 98%   BMI 30.11 kg/m?  ?Wt Readings from Last 3 Encounters:  ?11/14/21 222 lb (100.7 kg)  ?09/04/21 215 lb 12.8 oz (97.9 kg)  ?07/09/21 220 lb (99.8 kg)  ? ? ?Health Maintenance Due  ?Topic Date Due  ? COVID-19 Vaccine (4 - Booster for Pfizer series) 08/30/2020  ? ? ?There are no preventive care reminders to display for this patient. ? ? ?Lab Results  ?Component Value Date  ? TSH 1.330 07/07/2014  ? ?Lab Results  ?Component Value Date  ? WBC 9.0 05/04/2021  ? HGB 15.9 05/04/2021  ? HCT 48.2 05/04/2021  ? MCV 94 05/04/2021  ? PLT 151 05/04/2021  ? ?Lab Results  ?Component Value Date  ? NA 143  05/04/2021  ? K 4.6 05/04/2021  ? CO2 25 05/04/2021  ? GLUCOSE 100 (H) 05/04/2021  ? BUN 12 05/04/2021  ? CREATININE 0.75 (L) 05/04/2021  ? BILITOT 0.4 05/04/2021  ? ALKPHOS 120 05/04/2021  ? AST 13 05/04/2021

## 2021-11-14 NOTE — Patient Instructions (Signed)

## 2021-11-19 ENCOUNTER — Encounter: Payer: Self-pay | Admitting: Family Medicine

## 2021-11-19 ENCOUNTER — Ambulatory Visit (INDEPENDENT_AMBULATORY_CARE_PROVIDER_SITE_OTHER): Payer: Medicare Other | Admitting: Family Medicine

## 2021-11-19 VITALS — BP 141/87 | HR 85 | Ht 72.0 in | Wt 223.0 lb

## 2021-11-19 DIAGNOSIS — I878 Other specified disorders of veins: Secondary | ICD-10-CM | POA: Diagnosis not present

## 2021-11-19 MED ORDER — FUROSEMIDE 20 MG PO TABS: 20.0000 mg | ORAL_TABLET | Freq: Every day | ORAL | 0 refills | Status: DC

## 2021-11-19 NOTE — Progress Notes (Signed)
? ?BP (!) 141/87   Pulse 85   Ht 6' (1.829 m)   Wt 223 lb (101.2 kg)   SpO2 95%   BMI 30.24 kg/m?   ? ?Subjective:  ? ?Patient ID: Gregory Pearson, male    DOB: 12-01-1927, 88 y.o.   MRN: [id number] ? ?HPI: ?Gregory Pearson is a 88 y.o. male presenting on 11/19/2021 for Edema (BLE) and Numbness (RLE) ? ? ?HPI ?Bilateral lower extremity edema and swelling ?Patient has been complaining of bilateral lower extremity and swelling this been going on more over the past couple weeks.  He says it has been getting this problem off and on but it is worse over the past couple weeks.  He does not wear compression stockings that usually sufficient to help but it has not been helping as much recently as he usually does.  He says he has a little bit of numbness in both feet because of the swelling.  He denies any weakness or other changes in his legs. ? ?Relevant past medical, surgical, family and social history reviewed and updated as indicated. Interim medical history since our last visit reviewed. ?Allergies and medications reviewed and updated. ? ?Review of Systems  ?Constitutional:  Negative for chills and fever.  ?Eyes:  Negative for visual disturbance.  ?Respiratory:  Negative for shortness of breath and wheezing.   ?Cardiovascular:  Negative for chest pain and leg swelling.  ?Musculoskeletal:  Negative for back pain and gait problem.  ?Skin:  Negative for rash.  ?Neurological:  Negative for dizziness.  ?All other systems reviewed and are negative. ? ?Per HPI unless specifically indicated above ? ? ?Allergies as of 11/19/2021   ?No Known Allergies ?  ? ?  ?Medication List  ?  ? ?  ? Accurate as of November 19, 2021  2:57 PM. If you have any questions, ask your nurse or doctor.  ?  ?  ? ?  ? ?STOP taking these medications   ? ?amoxicillin-clavulanate 875-125 MG tablet ?Commonly known as: AUGMENTIN ?Stopped by: Worthy Rancher, MD ?  ?predniSONE 10 MG tablet ?Commonly known as: DELTASONE ?Stopped by: Worthy Rancher, MD ?  ? ?   ? ?TAKE these medications   ? ?acetaminophen 500 MG tablet ?Commonly known as: TYLENOL ?Take 1 tablet (500 mg total) by mouth every 6 (six) hours as needed. ?  ?diclofenac Sodium 1 % Gel ?Commonly known as: VOLTAREN ?Apply 2 g topically 4 (four) times daily. ?  ?furosemide 20 MG tablet ?Commonly known as: LASIX ?Take 1 tablet (20 mg total) by mouth daily. ?Started by: Worthy Rancher, MD ?  ?multivitamin tablet ?Take 1 tablet by mouth daily. ?  ?nabumetone 500 MG tablet ?Commonly known as: RELAFEN ?TAKE 2 TABLETS BY MOUTH TWICE  DAILY FOR MUSCLE AND JOINT PAIN ?  ?Omega-3 1000 MG Caps ?Take 2,000 mg by mouth daily. ?  ?pantoprazole 40 MG tablet ?Commonly known as: PROTONIX ?  ? ?  ? ? ? ?Objective:  ? ?BP (!) 141/87   Pulse 85   Ht 6' (1.829 m)   Wt 223 lb (101.2 kg)   SpO2 95%   BMI 30.24 kg/m?   ?Wt Readings from Last 3 Encounters:  ?11/19/21 223 lb (101.2 kg)  ?11/14/21 222 lb (100.7 kg)  ?09/04/21 215 lb 12.8 oz (97.9 kg)  ?  ?Physical Exam ?Vitals and nursing note reviewed.  ?Constitutional:   ?   General: He is not in acute distress. ?   Appearance: He is  well-developed. He is not diaphoretic.  ?Eyes:  ?   General: No scleral icterus. ?   Conjunctiva/sclera: Conjunctivae normal.  ?Neck:  ?   Thyroid: No thyromegaly.  ?Cardiovascular:  ?   Rate and Rhythm: Normal rate and regular rhythm.  ?   Heart sounds: Normal heart sounds. No murmur heard. ?Pulmonary:  ?   Effort: Pulmonary effort is normal. No respiratory distress.  ?   Breath sounds: Normal breath sounds. No wheezing.  ?Musculoskeletal:     ?   General: Swelling (2+ edema bilateral lower extremity) present. Normal range of motion.  ?   Cervical back: Neck supple.  ?Lymphadenopathy:  ?   Cervical: No cervical adenopathy.  ?Skin: ?   General: Skin is warm and dry.  ?   Findings: No rash.  ?Neurological:  ?   Mental Status: He is alert and oriented to person, place, and time.  ?   Coordination: Coordination normal.  ?Psychiatric:     ?   Behavior:  Behavior normal.  ? ? ? ? ?Assessment & Plan:  ? ?Problem List Items Addressed This Visit   ? ?  ? Other  ? Venous stasis - Primary  ? Relevant Medications  ? furosemide (LASIX) 20 MG tablet  ?  ?Recommended to do a few days furosemide to help on top of this compression stockings and if worsens or does not improve from there then let us know but not continue furosemide long-term, just use as needed after that. ?Follow up plan: ?Return if symptoms worsen or fail to improve. ? ?Counseling provided for all of the vaccine components ?No orders of the defined types were placed in this encounter. ? ? ?Caryl Pina, MD ?[place] ?11/19/2021, 2:57 PM ? ? ? ? ?

## 2021-11-28 DIAGNOSIS — M25562 Pain in left knee: Secondary | ICD-10-CM | POA: Diagnosis not present

## 2021-11-28 DIAGNOSIS — M170 Bilateral primary osteoarthritis of knee: Secondary | ICD-10-CM | POA: Diagnosis not present

## 2021-11-29 DIAGNOSIS — M9902 Segmental and somatic dysfunction of thoracic region: Secondary | ICD-10-CM | POA: Diagnosis not present

## 2021-11-29 DIAGNOSIS — M5431 Sciatica, right side: Secondary | ICD-10-CM | POA: Diagnosis not present

## 2021-11-29 DIAGNOSIS — M9903 Segmental and somatic dysfunction of lumbar region: Secondary | ICD-10-CM | POA: Diagnosis not present

## 2021-11-29 DIAGNOSIS — M9904 Segmental and somatic dysfunction of sacral region: Secondary | ICD-10-CM | POA: Diagnosis not present

## 2021-12-05 DIAGNOSIS — M170 Bilateral primary osteoarthritis of knee: Secondary | ICD-10-CM | POA: Diagnosis not present

## 2021-12-12 DIAGNOSIS — M170 Bilateral primary osteoarthritis of knee: Secondary | ICD-10-CM | POA: Diagnosis not present

## 2021-12-19 DIAGNOSIS — M9904 Segmental and somatic dysfunction of sacral region: Secondary | ICD-10-CM | POA: Diagnosis not present

## 2021-12-19 DIAGNOSIS — M9902 Segmental and somatic dysfunction of thoracic region: Secondary | ICD-10-CM | POA: Diagnosis not present

## 2021-12-19 DIAGNOSIS — M5431 Sciatica, right side: Secondary | ICD-10-CM | POA: Diagnosis not present

## 2021-12-19 DIAGNOSIS — M9903 Segmental and somatic dysfunction of lumbar region: Secondary | ICD-10-CM | POA: Diagnosis not present

## 2021-12-26 DIAGNOSIS — M9904 Segmental and somatic dysfunction of sacral region: Secondary | ICD-10-CM | POA: Diagnosis not present

## 2021-12-26 DIAGNOSIS — M9902 Segmental and somatic dysfunction of thoracic region: Secondary | ICD-10-CM | POA: Diagnosis not present

## 2021-12-26 DIAGNOSIS — M9903 Segmental and somatic dysfunction of lumbar region: Secondary | ICD-10-CM | POA: Diagnosis not present

## 2021-12-26 DIAGNOSIS — M5431 Sciatica, right side: Secondary | ICD-10-CM | POA: Diagnosis not present

## 2022-01-09 DIAGNOSIS — M9904 Segmental and somatic dysfunction of sacral region: Secondary | ICD-10-CM | POA: Diagnosis not present

## 2022-01-09 DIAGNOSIS — M9903 Segmental and somatic dysfunction of lumbar region: Secondary | ICD-10-CM | POA: Diagnosis not present

## 2022-01-09 DIAGNOSIS — M9902 Segmental and somatic dysfunction of thoracic region: Secondary | ICD-10-CM | POA: Diagnosis not present

## 2022-01-09 DIAGNOSIS — M5431 Sciatica, right side: Secondary | ICD-10-CM | POA: Diagnosis not present

## 2022-01-10 ENCOUNTER — Ambulatory Visit (INDEPENDENT_AMBULATORY_CARE_PROVIDER_SITE_OTHER): Payer: Medicare Other | Admitting: Family Medicine

## 2022-01-10 ENCOUNTER — Encounter: Payer: Self-pay | Admitting: Family Medicine

## 2022-01-10 VITALS — BP 136/89 | HR 90 | Temp 98.0°F | Ht 72.0 in | Wt 218.0 lb

## 2022-01-10 DIAGNOSIS — G8929 Other chronic pain: Secondary | ICD-10-CM

## 2022-01-10 DIAGNOSIS — M545 Low back pain, unspecified: Secondary | ICD-10-CM | POA: Diagnosis not present

## 2022-01-10 MED ORDER — METHYLPREDNISOLONE ACETATE 80 MG/ML IJ SUSP
80.0000 mg | Freq: Once | INTRAMUSCULAR | Status: AC
  Administered 2022-01-10: 80 mg via INTRAMUSCULAR

## 2022-01-10 NOTE — Progress Notes (Signed)
BP 136/89   Pulse 90   Temp 98 F (36.7 C)   Ht 6' (1.829 m)   Wt 218 lb (98.9 kg)   SpO2 96%   BMI 29.57 kg/m    Subjective:   Patient ID: Gregory Pearson, male    DOB: 12/10/1927, 88 y.o.   MRN: [id number]  HPI: Gregory Pearson is a 88 y.o. male presenting on 01/10/2022 for Back Pain (Right lower back pain)   HPI Right lower back pain Patient is coming in with right lower back pain, he has had arthritis and has seen chiropractors trying to help with his arthritis in his back which is not helping and is worse over the past months.  He is using Voltaren gel and relaxant and Tylenol and they do help but is just a little bit worse and it is making him more tired because of the pain and more fatigued.  He would like to try an injection if he can.  Relevant past medical, surgical, family and social history reviewed and updated as indicated. Interim medical history since our last visit reviewed. Allergies and medications reviewed and updated.  Review of Systems  Constitutional:  Negative for chills and fever.  Eyes:  Negative for visual disturbance.  Respiratory:  Negative for shortness of breath and wheezing.   Cardiovascular:  Positive for leg swelling. Negative for chest pain.  Musculoskeletal:  Positive for arthralgias and back pain. Negative for gait problem.  Skin:  Negative for rash.  All other systems reviewed and are negative.  Per HPI unless specifically indicated above   Allergies as of 01/10/2022   No Known Allergies      Medication List        Accurate as of Jan 10, 2022 11:44 AM. If you have any questions, ask your nurse or doctor.          acetaminophen 500 MG tablet Commonly known as: TYLENOL Take 1 tablet (500 mg total) by mouth every 6 (six) hours as needed.   diclofenac Sodium 1 % Gel Commonly known as: VOLTAREN Apply 2 g topically 4 (four) times daily.   furosemide 20 MG tablet Commonly known as: LASIX Take 1 tablet (20 mg total) by mouth daily.    multivitamin tablet Take 1 tablet by mouth daily.   nabumetone 500 MG tablet Commonly known as: RELAFEN TAKE 2 TABLETS BY MOUTH TWICE  DAILY FOR MUSCLE AND JOINT PAIN   Omega-3 1000 MG Caps Take 2,000 mg by mouth daily.   pantoprazole 40 MG tablet Commonly known as: PROTONIX         Objective:   BP 136/89   Pulse 90   Temp 98 F (36.7 C)   Ht 6' (1.829 m)   Wt 218 lb (98.9 kg)   SpO2 96%   BMI 29.57 kg/m   Wt Readings from Last 3 Encounters:  01/10/22 218 lb (98.9 kg)  11/19/21 223 lb (101.2 kg)  11/14/21 222 lb (100.7 kg)    Physical Exam Vitals and nursing note reviewed.  Constitutional:      General: He is not in acute distress.    Appearance: He is well-developed. He is not diaphoretic.  Eyes:     General: No scleral icterus.    Conjunctiva/sclera: Conjunctivae normal.  Musculoskeletal:        General: Swelling (1+) present.  Skin:    General: Skin is warm and dry.     Findings: No rash.  Neurological:     Mental Status:  He is alert and oriented to person, place, and time.     Coordination: Coordination normal.  Psychiatric:        Behavior: Behavior normal.      Assessment & Plan:   Problem List Items Addressed This Visit   None Visit Diagnoses     Chronic right-sided low back pain without sciatica    -  Primary   Relevant Medications   methylPREDNISolone acetate (DEPO-MEDROL) injection 80 mg (Start on 01/10/2022 11:45 AM)     We will do Depo 80 to see if it helps with his back, down, a lot of this is from arthritis and continue with his other medicines that he uses for arthritis as well.  Follow up plan: Return if symptoms worsen or fail to improve.  Counseling provided for all of the vaccine components get she has been well as this way today Plan I will No orders of the defined types were placed in this encounter.   Caryl Pina, MD [facility] 01/10/2022, 11:44 AM

## 2022-01-16 DIAGNOSIS — M5431 Sciatica, right side: Secondary | ICD-10-CM | POA: Diagnosis not present

## 2022-01-16 DIAGNOSIS — M9903 Segmental and somatic dysfunction of lumbar region: Secondary | ICD-10-CM | POA: Diagnosis not present

## 2022-01-16 DIAGNOSIS — M9904 Segmental and somatic dysfunction of sacral region: Secondary | ICD-10-CM | POA: Diagnosis not present

## 2022-01-16 DIAGNOSIS — M9902 Segmental and somatic dysfunction of thoracic region: Secondary | ICD-10-CM | POA: Diagnosis not present

## 2022-03-18 DIAGNOSIS — H6123 Impacted cerumen, bilateral: Secondary | ICD-10-CM | POA: Diagnosis not present

## 2022-04-20 ENCOUNTER — Other Ambulatory Visit: Payer: Self-pay | Admitting: Family Medicine

## 2022-04-25 IMAGING — DX DG SHOULDER 2+V*R*
3 series · 3 of 3 positions shown · non-contrast
Comparison: None.

CLINICAL DATA: Pain.

EXAM:
RIGHT SHOULDER - 2+ VIEW

[shoulder ap]
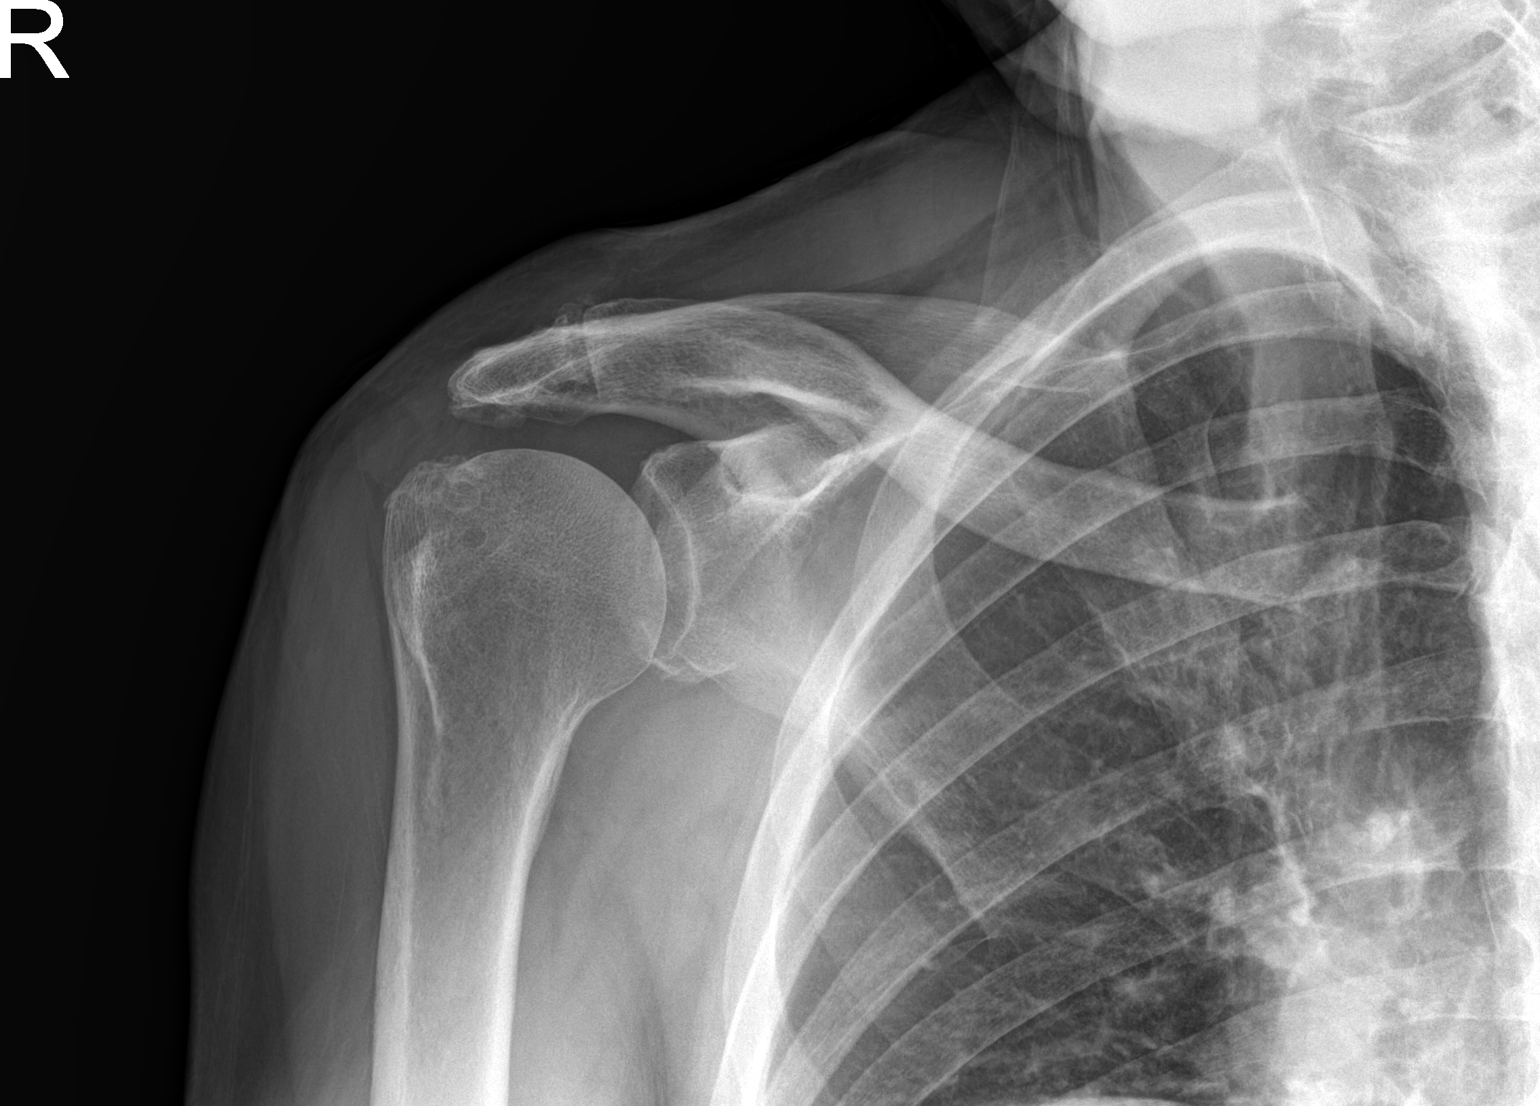

[shoulder obl]
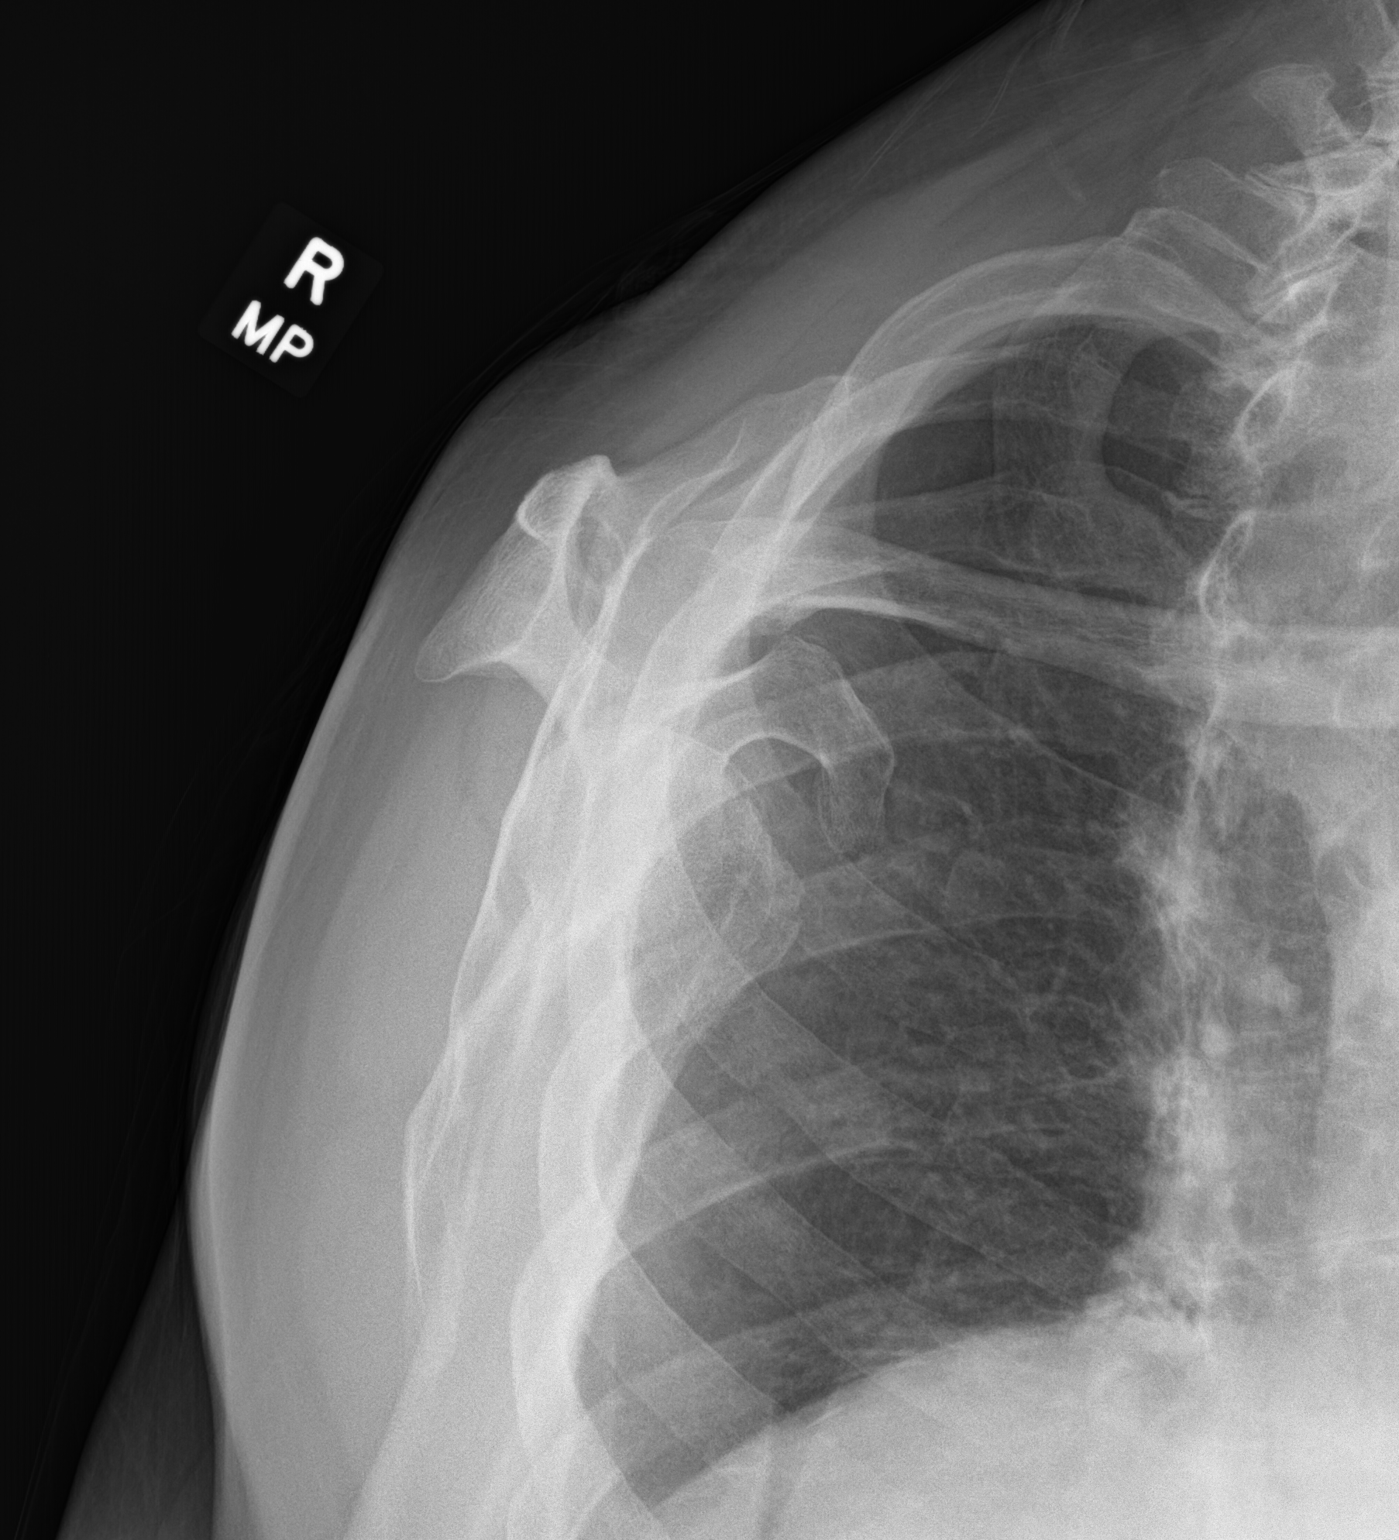

[shoulder axial]
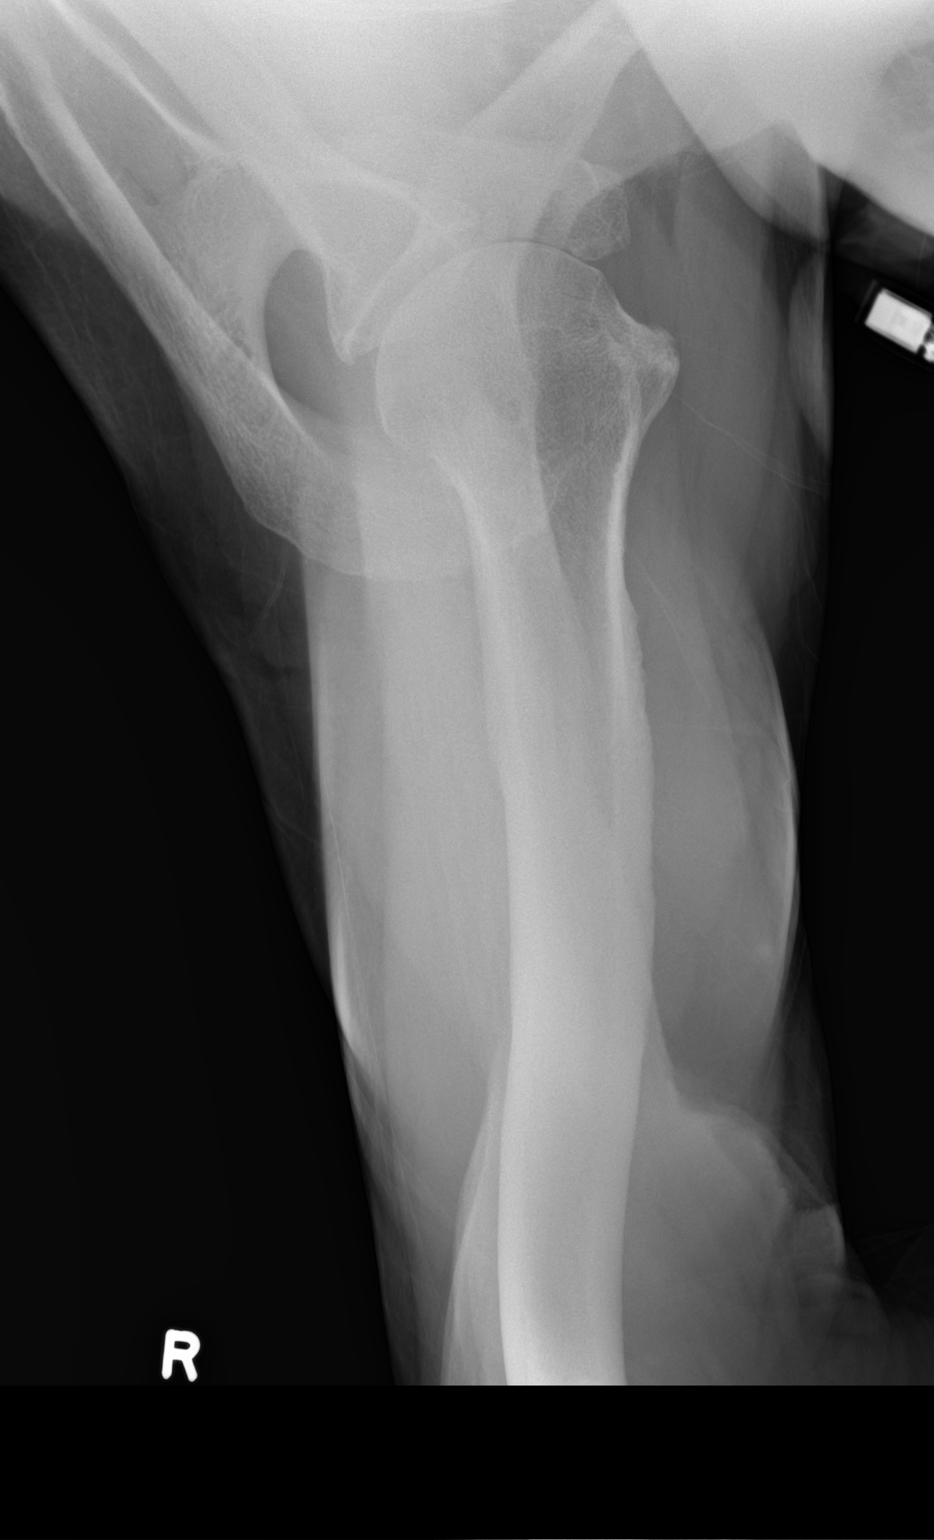

[3 of 3 positions shown; findings below may reference images not displayed]

FINDINGS: No acute fracture or dislocation. Visualized portion of the right
hemithorax is normal. Degenerative changes involve the rotator cuff
insertion and acromioclavicular joint.
IMPRESSION: Degenerative change, without acute osseous finding.

## 2022-04-26 ENCOUNTER — Other Ambulatory Visit: Payer: Self-pay

## 2022-04-26 ENCOUNTER — Inpatient Hospital Stay (HOSPITAL_COMMUNITY)
Admission: EM | Admit: 2022-04-26 | Discharge: 2022-04-29 | DRG: 872 | Disposition: A | Discharge status: Home Health | Payer: Medicare Other | Attending: Family Medicine | Admitting: Family Medicine

## 2022-04-26 ENCOUNTER — Emergency Department (HOSPITAL_COMMUNITY): Payer: Medicare Other

## 2022-04-26 ENCOUNTER — Encounter (HOSPITAL_COMMUNITY): Payer: Self-pay | Admitting: Emergency Medicine

## 2022-04-26 DIAGNOSIS — E782 Mixed hyperlipidemia: Secondary | ICD-10-CM | POA: Diagnosis not present

## 2022-04-26 DIAGNOSIS — I491 Atrial premature depolarization: Secondary | ICD-10-CM | POA: Diagnosis not present

## 2022-04-26 DIAGNOSIS — Z66 Do not resuscitate: Secondary | ICD-10-CM | POA: Diagnosis present

## 2022-04-26 DIAGNOSIS — A4151 Sepsis due to Escherichia coli [E. coli]: Principal | ICD-10-CM | POA: Diagnosis present

## 2022-04-26 DIAGNOSIS — Z8711 Personal history of peptic ulcer disease: Secondary | ICD-10-CM | POA: Diagnosis not present

## 2022-04-26 DIAGNOSIS — Z87440 Personal history of urinary (tract) infections: Secondary | ICD-10-CM | POA: Diagnosis not present

## 2022-04-26 DIAGNOSIS — N390 Urinary tract infection, site not specified: Secondary | ICD-10-CM | POA: Diagnosis not present

## 2022-04-26 DIAGNOSIS — Z20822 Contact with and (suspected) exposure to covid-19: Secondary | ICD-10-CM | POA: Diagnosis present

## 2022-04-26 DIAGNOSIS — I700 Atherosclerosis of aorta: Secondary | ICD-10-CM | POA: Diagnosis not present

## 2022-04-26 DIAGNOSIS — I959 Hypotension, unspecified: Secondary | ICD-10-CM | POA: Diagnosis not present

## 2022-04-26 DIAGNOSIS — R000 Tachycardia, unspecified: Secondary | ICD-10-CM | POA: Diagnosis not present

## 2022-04-26 DIAGNOSIS — R0902 Hypoxemia: Secondary | ICD-10-CM | POA: Diagnosis not present

## 2022-04-26 DIAGNOSIS — M1990 Unspecified osteoarthritis, unspecified site: Secondary | ICD-10-CM | POA: Diagnosis present

## 2022-04-26 DIAGNOSIS — N3000 Acute cystitis without hematuria: Principal | ICD-10-CM

## 2022-04-26 DIAGNOSIS — R509 Fever, unspecified: Secondary | ICD-10-CM

## 2022-04-26 DIAGNOSIS — Z8546 Personal history of malignant neoplasm of prostate: Secondary | ICD-10-CM

## 2022-04-26 DIAGNOSIS — R7881 Bacteremia: Secondary | ICD-10-CM | POA: Diagnosis not present

## 2022-04-26 DIAGNOSIS — Z8042 Family history of malignant neoplasm of prostate: Secondary | ICD-10-CM | POA: Diagnosis not present

## 2022-04-26 DIAGNOSIS — R6510 Systemic inflammatory response syndrome (SIRS) of non-infectious origin without acute organ dysfunction: Secondary | ICD-10-CM | POA: Diagnosis present

## 2022-04-26 DIAGNOSIS — B9620 Unspecified Escherichia coli [E. coli] as the cause of diseases classified elsewhere: Secondary | ICD-10-CM | POA: Diagnosis not present

## 2022-04-26 DIAGNOSIS — A419 Sepsis, unspecified organism: Secondary | ICD-10-CM | POA: Diagnosis present

## 2022-04-26 DIAGNOSIS — K219 Gastro-esophageal reflux disease without esophagitis: Secondary | ICD-10-CM | POA: Diagnosis present

## 2022-04-26 DIAGNOSIS — A4150 Gram-negative sepsis, unspecified: Secondary | ICD-10-CM | POA: Diagnosis not present

## 2022-04-26 DIAGNOSIS — E785 Hyperlipidemia, unspecified: Secondary | ICD-10-CM | POA: Diagnosis present

## 2022-04-26 DIAGNOSIS — Z79899 Other long term (current) drug therapy: Secondary | ICD-10-CM | POA: Diagnosis not present

## 2022-04-26 LAB — COMPREHENSIVE METABOLIC PANEL
ALT: 17 U/L (ref 0–44)
AST: 22 U/L (ref 15–41)
Albumin: 3.5 g/dL (ref 3.5–5.0)
Alkaline Phosphatase: 75 U/L (ref 38–126)
Anion gap: 8 (ref 5–15)
BUN: 16 mg/dL (ref 8–23)
CO2: 26 mmol/L (ref 22–32)
Calcium: 8.7 mg/dL — ABNORMAL LOW (ref 8.9–10.3)
Chloride: 104 mmol/L (ref 98–111)
Creatinine, Ser: 0.72 mg/dL (ref 0.61–1.24)
GFR, Estimated: >60 mL/min (ref >60)
Glucose, Bld: 115 mg/dL — ABNORMAL HIGH (ref 70–99)
Potassium: 3.8 mmol/L (ref 3.5–5.1)
Sodium: 138 mmol/L (ref 135–145)
Total Bilirubin: 1.2 mg/dL (ref 0.3–1.2)
Total Protein: 6.2 g/dL — ABNORMAL LOW (ref 6.5–8.1)

## 2022-04-26 LAB — BLOOD CULTURE ID PANEL (REFLEXED) - BCID2

## 2022-04-26 LAB — CBC WITH DIFFERENTIAL/PLATELET
Abs Immature Granulocytes: 0.02 10*3/uL (ref 0.00–0.07)
Basophils Absolute: 0 10*3/uL (ref 0.0–0.1)
Basophils Relative: 0 %
Eosinophils Absolute: 0 10*3/uL (ref 0.0–0.5)
Eosinophils Relative: 0 %
HCT: 41.8 % (ref 39.0–52.0)
Hemoglobin: 13.7 g/dL (ref 13.0–17.0)
Immature Granulocytes: 0 %
Lymphocytes Relative: 4 %
Lymphs Abs: 0.3 10*3/uL — ABNORMAL LOW (ref 0.7–4.0)
MCH: 33 pg (ref 26.0–34.0)
MCHC: 32.8 g/dL (ref 30.0–36.0)
MCV: 100.7 fL — ABNORMAL HIGH (ref 80.0–100.0)
Monocytes Absolute: 0.4 10*3/uL (ref 0.1–1.0)
Monocytes Relative: 5 %
Neutro Abs: 7.6 10*3/uL (ref 1.7–7.7)
Neutrophils Relative %: 91 %
Platelets: 144 10*3/uL — ABNORMAL LOW (ref 150–400)
RBC: 4.15 MIL/uL — ABNORMAL LOW (ref 4.22–5.81)
RDW: 12.4 % (ref 11.5–15.5)
WBC: 8.4 10*3/uL (ref 4.0–10.5)
nRBC: 0 % (ref 0.0–0.2)

## 2022-04-26 LAB — URINALYSIS, ROUTINE W REFLEX MICROSCOPIC
Bilirubin Urine: NEGATIVE
Glucose, UA: NEGATIVE mg/dL
Ketones, ur: 20 mg/dL — AB
Nitrite: POSITIVE — AB
Protein, ur: NEGATIVE mg/dL
Specific Gravity, Urine: 1.012 (ref 1.005–1.030)
WBC, UA: >50 WBC/hpf — ABNORMAL HIGH (ref 0–5)
pH: 8 (ref 5.0–8.0)

## 2022-04-26 LAB — RESP PANEL BY RT-PCR (FLU A&B, COVID) ARPGX2
Influenza A by PCR: NEGATIVE
Influenza B by PCR: NEGATIVE
SARS Coronavirus 2 by RT PCR: NEGATIVE

## 2022-04-26 LAB — APTT: aPTT: 30 seconds (ref 24–36)

## 2022-04-26 LAB — PROTIME-INR
INR: 1.1 (ref 0.8–1.2)
Prothrombin Time: 14 seconds (ref 11.4–15.2)

## 2022-04-26 LAB — LACTIC ACID, PLASMA: Lactic Acid, Venous: 1.1 mmol/L (ref 0.5–1.9)

## 2022-04-26 MED ORDER — KETOROLAC TROMETHAMINE 15 MG/ML IJ SOLN
15.0000 mg | Freq: Once | INTRAMUSCULAR | Status: AC
  Administered 2022-04-26: 15 mg via INTRAVENOUS
  Filled 2022-04-26: qty 1

## 2022-04-26 MED ORDER — SODIUM CHLORIDE 0.9 % IV SOLN
1.0000 g | Freq: Once | INTRAVENOUS | Status: AC
  Administered 2022-04-26: 1 g via INTRAVENOUS
  Filled 2022-04-26: qty 10

## 2022-04-26 MED ORDER — PANTOPRAZOLE SODIUM 40 MG PO TBEC
40.0000 mg | DELAYED_RELEASE_TABLET | Freq: Every day | ORAL | Status: DC
  Administered 2022-04-26 – 2022-04-29 (×4): 40 mg via ORAL
  Filled 2022-04-26 (×3): qty 1

## 2022-04-26 MED ORDER — OXYCODONE HCL 5 MG PO TABS: 5.0000 mg | ORAL_TABLET | ORAL | Status: DC | PRN

## 2022-04-26 MED ORDER — ONDANSETRON HCL 4 MG PO TABS: 4.0000 mg | ORAL_TABLET | Freq: Four times a day (QID) | ORAL | Status: DC | PRN

## 2022-04-26 MED ORDER — SODIUM CHLORIDE 0.9 % IV SOLN
2.0000 g | INTRAVENOUS | Status: DC
  Administered 2022-04-27 – 2022-04-29 (×3): 2 g via INTRAVENOUS
  Filled 2022-04-26 (×3): qty 20

## 2022-04-26 MED ORDER — ONDANSETRON HCL 4 MG/2ML IJ SOLN: 4.0000 mg | Freq: Four times a day (QID) | INTRAMUSCULAR | Status: DC | PRN

## 2022-04-26 MED ORDER — SODIUM CHLORIDE 0.9 % IV BOLUS
250.0000 mL | Freq: Once | INTRAVENOUS | Status: AC
  Administered 2022-04-26: 250 mL via INTRAVENOUS

## 2022-04-26 MED ORDER — SODIUM CHLORIDE 0.9 % IV SOLN: 1.0000 g | INTRAVENOUS | Status: DC

## 2022-04-26 MED ORDER — ACETAMINOPHEN 325 MG PO TABS: 650.0000 mg | ORAL_TABLET | Freq: Four times a day (QID) | ORAL | Status: DC | PRN

## 2022-04-26 MED ORDER — SODIUM CHLORIDE 0.9 % IV SOLN: INTRAVENOUS | Status: DC

## 2022-04-26 MED ORDER — LACTATED RINGERS IV BOLUS
750.0000 mL | Freq: Once | INTRAVENOUS | Status: AC
  Administered 2022-04-26: 750 mL via INTRAVENOUS

## 2022-04-26 MED ORDER — ORAL CARE MOUTH RINSE: 15.0000 mL | OROMUCOSAL | Status: DC | PRN

## 2022-04-26 MED ORDER — ACETAMINOPHEN 650 MG RE SUPP: 650.0000 mg | Freq: Four times a day (QID) | RECTAL | Status: DC | PRN

## 2022-04-26 MED ORDER — HEPARIN SODIUM (PORCINE) 5000 UNIT/ML IJ SOLN
5000.0000 [IU] | Freq: Three times a day (TID) | INTRAMUSCULAR | Status: DC
  Administered 2022-04-26 – 2022-04-29 (×10): 5000 [IU] via SUBCUTANEOUS
  Filled 2022-04-26 (×10): qty 1

## 2022-04-26 MED ORDER — IBUPROFEN 400 MG PO TABS
600.0000 mg | ORAL_TABLET | Freq: Once | ORAL | Status: DC
  Filled 2022-04-26: qty 2

## 2022-04-26 MED ORDER — SODIUM CHLORIDE 0.9 % IV BOLUS
500.0000 mL | Freq: Once | INTRAVENOUS | Status: AC
  Administered 2022-04-26: 500 mL via INTRAVENOUS

## 2022-04-26 NOTE — ED Notes (Addendum)
Pt updated, will be transported upstairs shortly.

## 2022-04-26 NOTE — Evaluation (Signed)
Physical Therapy Evaluation Patient Details Name: Gregory Pearson MRN: [id number] DOB: 1927-08-27 Today's Date: 04/26/2022  History of Present Illness  Gregory Pearson is a 88 y.o. male with medical history significant of GERD, PUD, hyperlipidemia, prostate cancer status post prostatectomy, and recurrent UTIs presents the ED with a chief complaint of rigors.  Patient reports that he went to bed in his normal state of health when he woke up cold and "nervous."  When asked to describe nervous, he reports that he was shaking.  He did not have diaphoresis.  He denies any chest pain, shortness of breath.  Patient reports that he did notice throughout the night he had been getting up to void every 30 minutes, when baseline for him is to get up every 2 hours to void.  He had no hematuria.  No malodorous urine.  Patient did get a gram of Tylenol without improvement in his fever.  He does report the Tylenol helps with his osteoarthritic pain.  Patient has no further complaints.   Clinical Impression  Patient functioning near baseline for functional mobility and gait demonstrating good return for bed mobility, transfers and ambulation in hallway without loss of balance.  Patient limited for ambulation mostly due to fatigue and put back to bed after therapy.  Patient's orthostatics as follows: lying 90/51, sitting 100/60, standing 102/74, not symptomatic during functional activity - RN notified.  Patient will benefit from continued skilled physical therapy in hospital and recommended venue below to increase strength, balance, endurance for safe ADLs and gait.      Recommendations for follow up therapy are one component of a multi-disciplinary discharge planning process, led by the attending physician.  Recommendations may be updated based on patient status, additional functional criteria and insurance authorization.  Follow Up Recommendations Home health PT      Assistance Recommended at Discharge Set up  Supervision/Assistance  Patient can return home with the following  A little help with walking and/or transfers;A little help with bathing/dressing/bathroom;Help with stairs or ramp for entrance;Assistance with cooking/housework    Equipment Recommendations None recommended by PT  Recommendations for Other Services       Functional Status Assessment Patient has had a recent decline in their functional status and demonstrates the ability to make significant improvements in function in a reasonable and predictable amount of time.     Precautions / Restrictions Precautions Precautions: Fall Restrictions Weight Bearing Restrictions: No      Mobility  Bed Mobility Overal bed mobility: Modified Independent             General bed mobility comments: slightly increased time    Transfers Overall transfer level: Needs assistance Equipment used: Rolling walker (2 wheels) Transfers: Sit to/from Stand, Bed to chair/wheelchair/BSC Sit to Stand: Supervision   Step pivot transfers: Supervision       General transfer comment: slightly labored movement without loss of balance    Ambulation/Gait Ambulation/Gait assistance: Supervision, Min guard Gait Distance (Feet): 75 Feet Assistive device: Rolling walker (2 wheels) Gait Pattern/deviations: Decreased step length - right, Decreased step length - left, Decreased stride length, Trunk flexed Gait velocity: decreased     General Gait Details: slightly labored cadence without loss of balance, limited mostly due to c/o fatigue  Stairs            Wheelchair Mobility    Modified Rankin (Stroke Patients Only)       Balance Overall balance assessment: Needs assistance Sitting-balance support: Feet supported, No upper extremity supported Sitting  balance-Leahy Scale: Good Sitting balance - Comments: seated at EOB   Standing balance support: During functional activity, Bilateral upper extremity supported Standing  balance-Leahy Scale: Fair Standing balance comment: fair/good using RW                             Pertinent Vitals/Pain Pain Assessment Pain Assessment: No/denies pain    Home Living Family/patient expects to be discharged to:: Private residence Living Arrangements: Alone Available Help at Discharge: Family;Available 24 hours/day Type of Home: House Home Access: Level entry       Home Layout: One level Home Equipment: Conservation officer, nature (2 wheels);Cane - single point;BSC/3in1;Grab bars - tub/shower;Shower seat Additional Comments: lives on same lot with daughter    Prior Function Prior Level of Function : Independent/Modified Independent             Mobility Comments: Hydrographic surveyor using RW, drives, shops ADLs Comments: Independent     Hand Dominance        Extremity/Trunk Assessment   Upper Extremity Assessment Upper Extremity Assessment: Overall WFL for tasks assessed    Lower Extremity Assessment Lower Extremity Assessment: Generalized weakness    Cervical / Trunk Assessment Cervical / Trunk Assessment: Kyphotic  Communication   Communication: No difficulties  Cognition Arousal/Alertness: Awake/alert Behavior During Therapy: WFL for tasks assessed/performed Overall Cognitive Status: Within Functional Limits for tasks assessed                                          General Comments      Exercises     Assessment/Plan    PT Assessment Patient needs continued PT services  PT Problem List Decreased strength;Decreased activity tolerance;Decreased balance;Decreased mobility       PT Treatment Interventions DME instruction;Gait training;Stair training;Functional mobility training;Therapeutic activities;Therapeutic exercise;Patient/family education;Balance training    PT Goals (Current goals can be found in the Care Plan section)  Acute Rehab PT Goals Patient Stated Goal: return home with family to assist PT Goal  Formulation: With patient Time For Goal Achievement: 04/29/22 Potential to Achieve Goals: Good    Frequency Min 3X/week     Co-evaluation               AM-PAC PT "6 Clicks" Mobility  Outcome Measure Help needed turning from your back to your side while in a flat bed without using bedrails?: None Help needed moving from lying on your back to sitting on the side of a flat bed without using bedrails?: None Help needed moving to and from a bed to a chair (including a wheelchair)?: A Little Help needed standing up from a chair using your arms (e.g., wheelchair or bedside chair)?: A Little Help needed to walk in hospital room?: A Little Help needed climbing 3-5 steps with a railing? : A Little 6 Click Score: 20    End of Session   Activity Tolerance: Patient tolerated treatment well;Patient limited by fatigue Patient left: in bed;with call bell/phone within reach Nurse Communication: Mobility status PT Visit Diagnosis: Unsteadiness on feet (R26.81);Other abnormalities of gait and mobility (R26.89);Muscle weakness (generalized) (M62.81)    Time: 5170-0174 PT Time Calculation (min) (ACUTE ONLY): 29 min   Charges:   PT Evaluation $PT Eval Moderate Complexity: 1 Mod PT Treatments $Therapeutic Activity: 23-37 mins        12:21 PM, 04/26/22 Lonell Grandchild,  MPT Physical Therapist with [facility] [phone] office [phone] mobile phone

## 2022-04-26 NOTE — Progress Notes (Signed)
  Transition of Care (TOC) Screening Note   Patient Details  Name: Doyl Bitting Date of Birth: 1927-12-02   Transition of Care [facility]) CM/SW Contact:    Ihor Gully, LCSW Phone Number: 04/26/2022, 3:17 PM    Transition of Care Department [facility]) has reviewed patient and no TOC needs have been identified at this time. We will continue to monitor patient advancement through interdisciplinary progression rounds. If new patient transition needs arise, please place a TOC consult.

## 2022-04-26 NOTE — H&P (Signed)
History and Physical    Patient: Gregory Pearson WVP:[id number] DOB: 05-24-1928 DOA: 04/26/2022 DOS: the patient was seen and examined on 04/26/2022 PCP: Dettinger, Fransisca Kaufmann, MD  Patient coming from: Home  Chief Complaint:  Chief Complaint  Patient presents with   Fever   HPI: Gregory Pearson is a 88 y.o. male with medical history significant of GERD, PUD, hyperlipidemia, prostate cancer status post prostatectomy, and recurrent UTIs presents the ED with a chief complaint of rigors.  Patient reports that he went to bed in his normal state of health when he woke up cold and "nervous."  When asked to describe nervous, he reports that he was shaking.  He did not have diaphoresis.  He denies any chest pain, shortness of breath.  Patient reports that he did notice throughout the night he had been getting up to void every 30 minutes, when baseline for him is to get up every 2 hours to void.  He had no hematuria.  No malodorous urine.  Patient did get a gram of Tylenol without improvement in his fever.  He does report the Tylenol helps with his osteoarthritic pain.  Patient has no further complaints.  Daughter at bedside is very concerned about why patient is having recurrent UTIs.  We discussed possible prophylactic antibiotic that could be set up with PCP.  Patient does not smoke, does not drink, does not use illicit drugs.  He is vaccinated for COVID.  Patient is DNR. Review of Systems: As mentioned in the history of present illness. All other systems reviewed and are negative. Past Medical History:  Diagnosis Date   Asbestos exposure    Blood transfusion without reported diagnosis    after ruptured appendix 1963   Diverticulosis    Gastric ulcer 06/2019   GERD (gastroesophageal reflux disease)    History of stomach ulcers 1995   H.pylori s/p treatment   History of stomach ulcers    Hyperlipidemia    Muscle strain of right gluteal region 01/24/2014   Prostate cancer ([place]) 2002   prostatectomy    Traumatic hematoma of buttock 01/24/2014   Past Surgical History:  Procedure Laterality Date   APPENDECTOMY  1963   ruptured appendix/gangrene   BIOPSY  07/06/2019   Procedure: BIOPSY;  Surgeon: Daneil Dolin, MD;  [facility];  Service: Endoscopy;;   COLONOSCOPY  2013   Dr. Fuller Plan: multiple colon polyps (tubular adenomas), diverticulosis. no further surveillance colonoscopies due to age.   ESOPHAGOGASTRODUODENOSCOPY  5462,[id number]   Dr. Fuller Plan: gastric ulcer (h.pylori + s/p tx), follow up EGD verified ulcer healing   ESOPHAGOGASTRODUODENOSCOPY N/A 09/26/2016   Procedure: ESOPHAGOGASTRODUODENOSCOPY (EGD);  Surgeon: Daneil Dolin, MD;  [facility];  Service: Endoscopy;  Laterality: N/A;   ESOPHAGOGASTRODUODENOSCOPY N/A 01/23/2017   Procedure: ESOPHAGOGASTRODUODENOSCOPY (EGD);  Surgeon: Daneil Dolin, MD;  [facility];  Service: Endoscopy;  Laterality: N/A;  12:15pm   ESOPHAGOGASTRODUODENOSCOPY N/A 07/06/2019   Procedure: ESOPHAGOGASTRODUODENOSCOPY (EGD);  Surgeon: Daneil Dolin, MD; normal esophagus, nonbleeding gastric ulcer with pigmented material, abnormal gastric mucosa of uncertain significance s/p biopsy, normal duodenum.  Pathology with mild reactive gastropathy, mild chronic gastritis, H. pylori negative.   ESOPHAGOGASTRODUODENOSCOPY N/A 10/20/2019   Procedure: ESOPHAGOGASTRODUODENOSCOPY (EGD);  Surgeon: Daneil Dolin, MD;  [facility];  Service: Endoscopy;  Laterality: N/A;  9:30am   [place]  2003   PROSTATECTOMY  2002   Social History:  reports that he has  never smoked. He has never used smokeless tobacco. He reports that he does not currently use alcohol. He reports that he does not use drugs.  No Known Allergies  Family History  Problem Relation Age of Onset   Prostate cancer Brother    Prostate cancer Brother    Suicidality Brother    Prostate cancer Brother    Stomach cancer Neg  Hx    Colon cancer Neg Hx     Prior to Admission medications   Medication Sig Start Date End Date Taking? Authorizing Provider  acetaminophen (TYLENOL) 500 MG tablet Take 1 tablet (500 mg total) by mouth every 6 (six) hours as needed. 11/14/21   Ivy Lynn, NP  diclofenac Sodium (VOLTAREN) 1 % GEL Apply 2 g topically 4 (four) times daily. 11/14/21   Ivy Lynn, NP  furosemide (LASIX) 20 MG tablet Take 1 tablet (20 mg total) by mouth daily. 11/19/21   Dettinger, Fransisca Kaufmann, MD  Multiple Vitamin (MULTIVITAMIN) tablet Take 1 tablet by mouth daily.    [provider]  nabumetone (RELAFEN) 500 MG tablet TAKE 2 TABLETS BY MOUTH TWICE  DAILY FOR MUSCLE AND JOINT PAIN 04/24/22   Dettinger, Fransisca Kaufmann, MD  Omega-3 1000 MG CAPS Take 2,000 mg by mouth daily.    [provider]  pantoprazole (PROTONIX) 40 MG tablet     [provider]    Physical Exam: Vitals:   04/26/22 0500 04/26/22 0515 04/26/22 0530 04/26/22 0600  BP: (!) 151/104  106/79 105/68  Pulse: (!) 111  (!) 111 (!) 115  Resp: (!) 29  (!) 28 (!) 26  Temp:  (!) 100.6 F (38.1 C)    TempSrc:  Oral    SpO2: 95%  92% 91%  Weight:      Height:       1.  General: Patient lying supine in bed,  no acute distress   2. Psychiatric: Alert and oriented x 3, mood and behavior normal for situation, pleasant and cooperative with exam   3. Neurologic: Speech and language are normal, face is symmetric, moves all 4 extremities voluntarily, at baseline without acute deficits on limited exam   4. HEENMT:  Head is atraumatic, normocephalic, pupils reactive to light, neck is supple, trachea is midline, mucous membranes are moist   5. Respiratory : Lungs are clear to auscultation bilaterally without wheezing, rhonchi, rales, no cyanosis, no increase in work of breathing or accessory muscle use   6. Cardiovascular : Heart rate normal, rhythm is regular, loud systolic murmur, rubs or gallops, trace peripheral edema,  peripheral pulses palpated   7. Gastrointestinal:  Abdomen is soft, nondistended, nontender to palpation bowel sounds active, no masses or organomegaly palpated   8. Skin:  Skin is warm, dry and intact without rashes, acute lesions, or ulcers on limited exam   9.Musculoskeletal:  No acute deformities or trauma, no asymmetry in tone, trace peripheral edema, peripheral pulses palpated, no tenderness to palpation in the extremities  Data Reviewed: In the ED Temp 100.6, heart rate 105-111, respiratory rate 26-29, blood pressure 106/79-151/104, satting at 93% No leukocytosis with a white blood cell count of 8.4, hemoglobin 13.7 Chemistry is unremarkable X-ray is without signs of infection COVID is negative UA is indicative of UTI Urine culture, blood culture pending Rocephin started Admission requested for UTI with persistent fever Assessment and Plan: * SIRS (systemic inflammatory response syndrome) (HCC) - Febrile at 100.6, tachycardic at 111, tachypneic to 29, no sign of endorgan damage -  UTI is the most likely source with the UA showing nitrites and greater than 50 white blood cells -Urine culture and blood culture pending -Previous urine culture shows E. coli with susceptibility to Rocephin -Rocephin started in the ED -Continue Rocephin -Tylenol for fever -COVID-negative -X-ray does not show signs of infection -Continue to monitor  UTI (urinary tract infection) - See plan for SIRS -Continue Rocephin  History of gastric ulcer - Secondary to NSAIDs -Avoid NSAIDs p.o. -Continue Protonix   Hyperlipidemia - Currently diet controlled, continue heart healthy diet      Advance Care Planning:   Code Status: Prior DNR  Consults: None  Family Communication: Daughter at bedside  Severity of Illness: The appropriate patient status for this patient is OBSERVATION. Observation status is judged to be reasonable and necessary in order to provide the required intensity of  service to ensure the patient's safety. The patient's presenting symptoms, physical exam findings, and initial radiographic and laboratory data in the context of their medical condition is felt to place them at decreased risk for further clinical deterioration. Furthermore, it is anticipated that the patient will be medically stable for discharge from the hospital within 2 midnights of admission.   Author: Rolla Plate, DO 04/26/2022 6:29 AM  For on call review [email or web address].

## 2022-04-26 NOTE — Plan of Care (Signed)
  Problem: Acute Rehab PT Goals(only PT should resolve) Goal: Pt Will Go Supine/Side To Sit Outcome: Progressing Flowsheets (Taken 04/26/2022 1223) Pt will go Supine/Side to Sit:  Independently  with modified independence Goal: Patient Will Transfer Sit To/From Stand Outcome: Progressing Flowsheets (Taken 04/26/2022 1223) Patient will transfer sit to/from stand:  with modified independence  with supervision Goal: Pt Will Transfer Bed To Chair/Chair To Bed Outcome: Progressing Flowsheets (Taken 04/26/2022 1223) Pt will Transfer Bed to Chair/Chair to Bed:  with modified independence  with supervision Goal: Pt Will Ambulate Outcome: Progressing Flowsheets (Taken 04/26/2022 1223) Pt will Ambulate:  100 feet  with modified independence  with supervision  with rolling walker   12:24 PM, 04/26/22 Lonell Grandchild, MPT Physical Therapist with [facility] [phone] office [phone] mobile phone

## 2022-04-26 NOTE — Progress Notes (Signed)
ASSUMPTION OF CARE NOTE   04/26/2022 11:32 AM  Gregory Pearson was seen and examined.  The H&P by the admitting provider, orders, imaging was reviewed.  Please see new orders.  Will continue to follow.   Vitals:   04/26/22 0900 04/26/22 1000  BP:  99/72  Pulse: 90 100  Resp: (!) 29 20  Temp:  98.2 F (36.8 C)  SpO2: 93% 94%    Results for orders placed or performed during the hospital encounter of 04/26/22  Blood Culture (routine x 2)   Specimen: BLOOD LEFT WRIST  Result Value Ref Range   Specimen Description      BLOOD LEFT WRIST BOTTLES DRAWN AEROBIC AND ANAEROBIC   Special Requests Blood Culture adequate volume    Culture      NO GROWTH < 12 HOURS Performed at [facility], [address] [place], [place] [address]    Report Status PENDING   Blood Culture (routine x 2)   Specimen: BLOOD RIGHT ARM  Result Value Ref Range   Specimen Description      BLOOD RIGHT ARM BOTTLES DRAWN AEROBIC AND ANAEROBIC   Special Requests Blood Culture adequate volume    Culture      NO GROWTH < 12 HOURS Performed at [facility], [address], [place], [place] [address]    Report Status PENDING   Resp Panel by RT-PCR (Flu A&B, Covid) Anterior Nasal Swab   Specimen: Anterior Nasal Swab  Result Value Ref Range   SARS Coronavirus 2 by RT PCR NEGATIVE NEGATIVE   Influenza A by PCR NEGATIVE NEGATIVE   Influenza B by PCR NEGATIVE NEGATIVE  Lactic acid, plasma  Result Value Ref Range   Lactic Acid, Venous 1.1 0.5 - 1.9 mmol/L  Comprehensive metabolic panel  Result Value Ref Range   Sodium 138 135 - 145 mmol/L   Potassium 3.8 3.5 - 5.1 mmol/L   Chloride 104 98 - 111 mmol/L   CO2 26 22 - 32 mmol/L   Glucose, Bld 115 (H) 70 - 99 mg/dL   BUN 16 8 - 23 mg/dL   Creatinine, Ser 0.72 0.61 - 1.24 mg/dL   Calcium 8.7 (L) 8.9 - 10.3 mg/dL   Total Protein 6.2 (L) 6.5 - 8.1 g/dL   Albumin 3.5 3.5 - 5.0 g/dL   AST 22 15 - 41 U/L   ALT 17 0 - 44 U/L   Alkaline Phosphatase 75 38 - 126 U/L   Total  Bilirubin 1.2 0.3 - 1.2 mg/dL   GFR, Estimated >60 >60 mL/min   Anion gap 8 5 - 15  CBC with Differential  Result Value Ref Range   WBC 8.4 4.0 - 10.5 K/uL   RBC 4.15 (L) 4.22 - 5.81 MIL/uL   Hemoglobin 13.7 13.0 - 17.0 g/dL   HCT 41.8 39.0 - 52.0 %   MCV 100.7 (H) 80.0 - 100.0 fL   MCH 33.0 26.0 - 34.0 pg   MCHC 32.8 30.0 - 36.0 g/dL   RDW 12.4 11.5 - 15.5 %   Platelets 144 (L) 150 - 400 K/uL   nRBC 0.0 0.0 - 0.2 %   Neutrophils Relative % 91 %   Neutro Abs 7.6 1.7 - 7.7 K/uL   Lymphocytes Relative 4 %   Lymphs Abs 0.3 (L) 0.7 - 4.0 K/uL   Monocytes Relative 5 %   Monocytes Absolute 0.4 0.1 - 1.0 K/uL   Eosinophils Relative 0 %   Eosinophils Absolute 0.0 0.0 - 0.5 K/uL  Basophils Relative 0 %   Basophils Absolute 0.0 0.0 - 0.1 K/uL   Immature Granulocytes 0 %   Abs Immature Granulocytes 0.02 0.00 - 0.07 K/uL  Protime-INR  Result Value Ref Range   Prothrombin Time 14.0 11.4 - 15.2 seconds   INR 1.1 0.8 - 1.2  APTT  Result Value Ref Range   aPTT 30 24 - 36 seconds  Urinalysis, Routine w reflex microscopic  Result Value Ref Range   Color, Urine YELLOW YELLOW   APPearance HAZY (A) CLEAR   Specific Gravity, Urine 1.012 1.005 - 1.030   pH 8.0 5.0 - 8.0   Glucose, UA NEGATIVE NEGATIVE mg/dL   Hgb urine dipstick MODERATE (A) NEGATIVE   Bilirubin Urine NEGATIVE NEGATIVE   Ketones, ur 20 (A) NEGATIVE mg/dL   Protein, ur NEGATIVE NEGATIVE mg/dL   Nitrite POSITIVE (A) NEGATIVE   Leukocytes,Ua LARGE (A) NEGATIVE   RBC / HPF 21-50 0 - 5 RBC/hpf   WBC, UA >50 (H) 0 - 5 WBC/hpf   Bacteria, UA RARE (A) NONE SEEN   Mucus PRESENT      Murvin Natal, MD [facility]   04/26/2022  4:05 AM How to contact the [facility] Attending or Consulting provider 7A - 7P or covering provider during after hours 7P -7A, for this patient?  Check the care team in [facility] and look for a) attending/consulting [facility] provider listed and b) the [facility] team listed Log into [email or web address] and use [place]'s  universal password to access. If you do not have the password, please contact the hospital operator. Locate the [facility] provider you are looking for under [facility] and page to a number that you can be directly reached. If you still have difficulty reaching the provider, please page the [facility] (Director on Call) for the Hospitalists listed on amion for assistance.

## 2022-04-26 NOTE — ED Notes (Signed)
Introduced self to patient and family. Repositioned pt and explained delay with admit.

## 2022-04-26 NOTE — ED Notes (Signed)
Patient trembling at this time. Respirations ranging from 22 to 27. Patient states that he is not having any shortness of breath. Patient states that he is very cold. Took patient's temp orally 100.6

## 2022-04-26 NOTE — ED Provider Notes (Signed)
[facility] EMERGENCY DEPARTMENT  Provider Note  CSN: [id number] Arrival date & time: 04/26/22 0354  History Chief Complaint  Patient presents with   Fever    Gregory Pearson is a 88 y.o. male brought to the ED via EMS for fever. He went to bed feeling well but woke up around 1am with shaking chills, took '1000mg'$  APAP at that time. He has had some diarrhea recently, but no cough, SOB, CP, N/V or dysuria. He was apparently borderline hypoxic with EMS and started on [place] oxygen but SpO2 93-94% on RA at the time of my evaluation.    Home Medications Prior to Admission medications   Medication Sig Start Date End Date Taking? Authorizing Provider  acetaminophen (TYLENOL) 500 MG tablet Take 1 tablet (500 mg total) by mouth every 6 (six) hours as needed. 11/14/21   Ivy Lynn, NP  diclofenac Sodium (VOLTAREN) 1 % GEL Apply 2 g topically 4 (four) times daily. 11/14/21   Ivy Lynn, NP  furosemide (LASIX) 20 MG tablet Take 1 tablet (20 mg total) by mouth daily. 11/19/21   Dettinger, Fransisca Kaufmann, MD  Multiple Vitamin (MULTIVITAMIN) tablet Take 1 tablet by mouth daily.    [provider]  nabumetone (RELAFEN) 500 MG tablet TAKE 2 TABLETS BY MOUTH TWICE  DAILY FOR MUSCLE AND JOINT PAIN 04/24/22   Dettinger, Fransisca Kaufmann, MD  Omega-3 1000 MG CAPS Take 2,000 mg by mouth daily.    [provider]  pantoprazole (PROTONIX) 40 MG tablet     [provider]     Allergies    Patient has no known allergies.   Review of Systems   Review of Systems Please see HPI for pertinent positives and negatives  Physical Exam BP 105/68   Pulse (!) 115   Temp (!) 100.6 F (38.1 C) (Oral)   Resp (!) 26   Ht 6' (1.829 m)   Wt 95.3 kg   SpO2 91%   BMI 28.48 kg/m   Physical Exam Vitals and nursing note reviewed.  Constitutional:      Appearance: Normal appearance.  HENT:     Head: Normocephalic and atraumatic.     Nose: Nose normal.     Mouth/Throat:     Mouth: Mucous membranes  are moist.  Eyes:     Extraocular Movements: Extraocular movements intact.     Conjunctiva/sclera: Conjunctivae normal.  Cardiovascular:     Rate and Rhythm: Tachycardia present.  Pulmonary:     Effort: Pulmonary effort is normal.     Breath sounds: Rhonchi present.  Abdominal:     General: Abdomen is flat.     Palpations: Abdomen is soft.     Tenderness: There is no abdominal tenderness.  Musculoskeletal:        General: No swelling. Normal range of motion.     Cervical back: Neck supple.  Skin:    General: Skin is warm and dry.  Neurological:     General: No focal deficit present.     Mental Status: He is alert.  Psychiatric:        Mood and Affect: Mood normal.     ED Results / Procedures / Treatments   EKG EKG Interpretation  Date/Time:  Friday April 26 2022 04:04:50 EDT Ventricular Rate:  107 PR Interval:  168 QRS Duration: 94 QT Interval:  335 QTC Calculation: 447 R Axis:   8 Text Interpretation: Sinus tachycardia Atrial premature complex Abnormal R-wave progression, early transition Baseline wander in  lead(s) V6 No significant change since last tracing Confirmed by Calvert Cantor [phone]) on 04/26/2022 4:27:51 AM  Procedures Procedures  Medications Ordered in the ED Medications  cefTRIAXone (ROCEPHIN) 1 g in sodium chloride 0.9 % 100 mL IVPB (1 g Intravenous New Bag/Given 04/26/22 0538)  cefTRIAXone (ROCEPHIN) 1 g in sodium chloride 0.9 % 100 mL IVPB (has no administration in time range)    Initial Impression and Plan  Patient here with fever, mild tachycardia. Will check labs, CXR and UA to evaluate source of fever. Currently BP is normal. Will hold off on large volume IVF bolus pending signs of end organ damage. He has had Covid vaccinations.   ED Course   Clinical Course as of 04/26/22 0604  Fri Apr 26, 2022  0502 Lactic acid is normal.  [CS]  0505 CBC and CMP unremarkable.  [CS]  0510 I personally viewed the images from radiology studies and agree  with radiologist interpretation: CXR is neg for acute process.   [CS]  9449 Covid/Flu are negative.  [CS]  6759 UA appears the be source of his fever. Will begin Rocephin; prior cultures have shown e-coli sensitive to rocephin.  [CS]  [phone] Spoke with Dr. Ander Slade, Hospitalist, who will evaluate for admission.  [CS]    Clinical Course User Index [CS] Truddie Hidden, MD     MDM Rules/Calculators/A&P Medical Decision Making Problems Addressed: Acute cystitis without hematuria: acute illness or injury Febrile illness: acute illness or injury  Amount and/or Complexity of Data Reviewed Labs: ordered. Decision-making details documented in ED Course. Radiology: ordered and independent interpretation performed. Decision-making details documented in ED Course. ECG/medicine tests: ordered and independent interpretation performed. Decision-making details documented in ED Course.  Risk Prescription drug management. Decision regarding hospitalization.    Final Clinical Impression(s) / ED Diagnoses Final diagnoses:  Acute cystitis without hematuria  Febrile illness    Rx / DC Orders ED Discharge Orders     None        Truddie Hidden, MD 04/26/22 [phone]

## 2022-04-26 NOTE — ED Triage Notes (Signed)
  Patient BIB EMS for possible fever.  Patient woke up around 0100 not feeling good and took 1000 mg of tylenol.  Patient states he had chills and called EMS.  EMS stated patient was 90% on RA and was placed on 2L O2.  Rectal temp 100.8 on arrival.  No SOB.  No complaints of pain.

## 2022-04-26 NOTE — ED Notes (Signed)
Pt ambulated with physical therapist in hallway with walker.

## 2022-04-26 NOTE — Assessment & Plan Note (Signed)
-  treated with ceftriaxone high dose IV and DC home on oral cefdinir

## 2022-04-26 NOTE — Assessment & Plan Note (Signed)
-   Febrile at 100.6, tachycardic at 111, tachypneic to 29, no sign of endorgan damage -UTI is the most likely source with the UA showing nitrites and greater than 50 white blood cells -Urine culture and blood culture pending -Previous urine culture shows E. coli with susceptibility to Rocephin -Rocephin started in the ED -Continue Rocephin -Tylenol for fever -COVID-negative -X-ray does not show signs of infection -Continue to monitor

## 2022-04-26 NOTE — ED Notes (Signed)
Pt was given breakfast tray 

## 2022-04-26 NOTE — Assessment & Plan Note (Addendum)
-   Currently diet controlled, continue heart healthy diet

## 2022-04-26 NOTE — ED Notes (Signed)
Physical therapist at bedside

## 2022-04-26 NOTE — Progress Notes (Signed)
Date and time results received: 04/26/22 2138   Test: BCID Critical Value: positive for e-coli  Name of Provider Notified: Zierle-Ghosh  Orders Received? Or Actions Taken?: Awaiting new orders

## 2022-04-26 NOTE — Progress Notes (Signed)
Lab called blood culture all 4 bottles were growing gram negative rods. Notified Dr. Wynetta Emery, no new orders.

## 2022-04-26 NOTE — Assessment & Plan Note (Addendum)
-  Continue Protonix for GI protection

## 2022-04-27 DIAGNOSIS — Z20822 Contact with and (suspected) exposure to covid-19: Secondary | ICD-10-CM | POA: Diagnosis present

## 2022-04-27 DIAGNOSIS — M1990 Unspecified osteoarthritis, unspecified site: Secondary | ICD-10-CM | POA: Diagnosis present

## 2022-04-27 DIAGNOSIS — Z8546 Personal history of malignant neoplasm of prostate: Secondary | ICD-10-CM | POA: Diagnosis not present

## 2022-04-27 DIAGNOSIS — R7881 Bacteremia: Secondary | ICD-10-CM | POA: Diagnosis not present

## 2022-04-27 DIAGNOSIS — A4150 Gram-negative sepsis, unspecified: Secondary | ICD-10-CM | POA: Diagnosis present

## 2022-04-27 DIAGNOSIS — N3000 Acute cystitis without hematuria: Secondary | ICD-10-CM | POA: Diagnosis not present

## 2022-04-27 DIAGNOSIS — E782 Mixed hyperlipidemia: Secondary | ICD-10-CM | POA: Diagnosis not present

## 2022-04-27 DIAGNOSIS — Z8042 Family history of malignant neoplasm of prostate: Secondary | ICD-10-CM | POA: Diagnosis not present

## 2022-04-27 DIAGNOSIS — B9620 Unspecified Escherichia coli [E. coli] as the cause of diseases classified elsewhere: Secondary | ICD-10-CM

## 2022-04-27 DIAGNOSIS — Z66 Do not resuscitate: Secondary | ICD-10-CM | POA: Diagnosis present

## 2022-04-27 DIAGNOSIS — N390 Urinary tract infection, site not specified: Secondary | ICD-10-CM | POA: Diagnosis present

## 2022-04-27 DIAGNOSIS — Z79899 Other long term (current) drug therapy: Secondary | ICD-10-CM | POA: Diagnosis not present

## 2022-04-27 DIAGNOSIS — Z87440 Personal history of urinary (tract) infections: Secondary | ICD-10-CM | POA: Diagnosis not present

## 2022-04-27 DIAGNOSIS — E785 Hyperlipidemia, unspecified: Secondary | ICD-10-CM | POA: Diagnosis present

## 2022-04-27 DIAGNOSIS — A4151 Sepsis due to Escherichia coli [E. coli]: Secondary | ICD-10-CM | POA: Diagnosis present

## 2022-04-27 DIAGNOSIS — Z8711 Personal history of peptic ulcer disease: Secondary | ICD-10-CM | POA: Diagnosis not present

## 2022-04-27 DIAGNOSIS — K219 Gastro-esophageal reflux disease without esophagitis: Secondary | ICD-10-CM | POA: Diagnosis present

## 2022-04-27 DIAGNOSIS — R6510 Systemic inflammatory response syndrome (SIRS) of non-infectious origin without acute organ dysfunction: Secondary | ICD-10-CM | POA: Diagnosis present

## 2022-04-27 LAB — MRSA NEXT GEN BY PCR, NASAL: MRSA by PCR Next Gen: NOT DETECTED

## 2022-04-27 LAB — CBC WITH DIFFERENTIAL/PLATELET
Abs Immature Granulocytes: 0.04 10*3/uL (ref 0.00–0.07)
Basophils Absolute: 0 10*3/uL (ref 0.0–0.1)
Basophils Relative: 0 %
Eosinophils Absolute: 0.1 10*3/uL (ref 0.0–0.5)
Eosinophils Relative: 1 %
HCT: 36.8 % — ABNORMAL LOW (ref 39.0–52.0)
Hemoglobin: 11.8 g/dL — ABNORMAL LOW (ref 13.0–17.0)
Immature Granulocytes: 0 %
Lymphocytes Relative: 8 %
Lymphs Abs: 1 10*3/uL (ref 0.7–4.0)
MCH: 33 pg (ref 26.0–34.0)
MCHC: 32.1 g/dL (ref 30.0–36.0)
MCV: 102.8 fL — ABNORMAL HIGH (ref 80.0–100.0)
Monocytes Absolute: 1.2 10*3/uL — ABNORMAL HIGH (ref 0.1–1.0)
Monocytes Relative: 10 %
Neutro Abs: 9.9 10*3/uL — ABNORMAL HIGH (ref 1.7–7.7)
Neutrophils Relative %: 81 %
Platelets: 135 10*3/uL — ABNORMAL LOW (ref 150–400)
RBC: 3.58 MIL/uL — ABNORMAL LOW (ref 4.22–5.81)
RDW: 12.8 % (ref 11.5–15.5)
WBC: 12.2 10*3/uL — ABNORMAL HIGH (ref 4.0–10.5)
nRBC: 0 % (ref 0.0–0.2)

## 2022-04-27 LAB — COMPREHENSIVE METABOLIC PANEL
ALT: 15 U/L (ref 0–44)
AST: 16 U/L (ref 15–41)
Albumin: 2.6 g/dL — ABNORMAL LOW (ref 3.5–5.0)
Alkaline Phosphatase: 60 U/L (ref 38–126)
Anion gap: 4 — ABNORMAL LOW (ref 5–15)
BUN: 19 mg/dL (ref 8–23)
CO2: 26 mmol/L (ref 22–32)
Calcium: 7.8 mg/dL — ABNORMAL LOW (ref 8.9–10.3)
Chloride: 110 mmol/L (ref 98–111)
Creatinine, Ser: 0.76 mg/dL (ref 0.61–1.24)
GFR, Estimated: >60 mL/min (ref >60)
Glucose, Bld: 103 mg/dL — ABNORMAL HIGH (ref 70–99)
Potassium: 3.9 mmol/L (ref 3.5–5.1)
Sodium: 140 mmol/L (ref 135–145)
Total Bilirubin: 0.8 mg/dL (ref 0.3–1.2)
Total Protein: 5 g/dL — ABNORMAL LOW (ref 6.5–8.1)

## 2022-04-27 LAB — MAGNESIUM: Magnesium: 1.9 mg/dL (ref 1.7–2.4)

## 2022-04-27 MED ORDER — CHLORHEXIDINE GLUCONATE CLOTH 2 % EX PADS
6.0000 | MEDICATED_PAD | Freq: Every day | CUTANEOUS | Status: DC
  Administered 2022-04-27 – 2022-04-29 (×2): 6 via TOPICAL

## 2022-04-27 MED ORDER — SODIUM CHLORIDE 0.9 % IV BOLUS
500.0000 mL | Freq: Once | INTRAVENOUS | Status: AC
  Administered 2022-04-27: 500 mL via INTRAVENOUS

## 2022-04-27 NOTE — Assessment & Plan Note (Signed)
--   treated with supportive measures -- BPs have rebounded now back to normal readings -- treated infection aggressively  -- sepsis physiology resolved now

## 2022-04-27 NOTE — Progress Notes (Signed)
PROGRESS NOTE   Gregory Pearson  AJO:[id number] DOB: 11/23/1927 DOA: 04/26/2022 PCP: Dettinger, Fransisca Kaufmann, MD   Chief Complaint  Patient presents with   Fever   Level of care: Stepdown  Brief Admission History:  88 y.o. male with medical history significant of GERD, PUD, hyperlipidemia, prostate cancer status post prostatectomy, and recurrent UTIs presents the ED with a chief complaint of rigors.  Patient reports that he went to bed in his normal state of health when he woke up cold and "nervous."  When asked to describe nervous, he reports that he was shaking.  He did not have diaphoresis.  He denies any chest pain, shortness of breath.  Patient reports that he did notice throughout the night he had been getting up to void every 30 minutes, when baseline for him is to get up every 2 hours to void.  He had no hematuria.  No malodorous urine.  Patient did get a gram of Tylenol without improvement in his fever.  He does report the Tylenol helps with his osteoarthritic pain.  Patient has no further complaints.   Daughter at bedside is very concerned about why patient is having recurrent UTIs.  We discussed possible prophylactic antibiotic that could be set up with PCP.   Patient does not smoke, does not drink, does not use illicit drugs.  He is vaccinated for COVID.  Patient is DNR.   Assessment and Plan: E coli bacteremia -- follow up BCID and C&S results -- continue high dose IV ceftriaxone for now  Sepsis due to E Coli -- continue supportive measures -- BPs remain soft but tolerating MAP down to 60 if asymptomatic -- treating infection aggressively   UTI (urinary tract infection) -Continue Rocephin  History of gastric ulcer - Secondary to NSAIDs -Avoid NSAIDs p.o. -Continue Protonix   Hyperlipidemia - Currently diet controlled, continue heart healthy diet  DVT prophylaxis: [place] heparin Code Status: DNR Family Communication:  Disposition: Status is: Inpatient Remains inpatient  appropriate because: intensity, IV antibiotic required   Consultants:   Procedures:   Antimicrobials:  Ceftriaxone 2 gm IV 9/1>>  Subjective: Pt reports he has malaise symptoms today.  Objective: Vitals:   04/27/22 1100 04/27/22 1200 04/27/22 1300 04/27/22 1400  BP: 106/71 120/75 128/68 (!) 113/56  Pulse: 75 97 95 92  Resp: (!) '21 12 15 '$ (!) 25  Temp:      TempSrc:      SpO2: 97% 99% 94% 95%  Weight:      Height:        Intake/Output Summary (Last 24 hours) at 04/27/2022 1543 Last data filed at 04/27/2022 1139 Gross per 24 hour  Intake 2256.54 ml  Output 4000 ml  Net -1743.46 ml   Filed Weights   04/26/22 0417 04/27/22 0431  Weight: 95.3 kg 91.2 kg   Examination:  General exam: Appears calm and comfortable  Respiratory system: Clear to auscultation. Respiratory effort normal. Cardiovascular system: normal S1 & S2 heard. No JVD, murmurs, rubs, gallops or clicks. No pedal edema. Gastrointestinal system: Abdomen is nondistended, soft and nontender. No organomegaly or masses felt. Normal bowel sounds heard. Central nervous system: Alert and oriented. No focal neurological deficits. Extremities: Symmetric 5 x 5 power. Skin: No rashes, lesions or ulcers. Psychiatry: Judgement and insight appear normal. Mood & affect appropriate.   Data Reviewed: I have personally reviewed following labs and imaging studies  CBC: Recent Labs  Lab 04/26/22 0422 04/27/22 0342  WBC 8.4 12.2*  NEUTROABS 7.6 9.9*  HGB  13.7 11.8*  HCT 41.8 36.8*  MCV 100.7* 102.8*  PLT 144* 135*    Basic Metabolic Panel: Recent Labs  Lab 04/26/22 0422 04/27/22 0342  NA 138 140  K 3.8 3.9  CL 104 110  CO2 26 26  GLUCOSE 115* 103*  BUN 16 19  CREATININE 0.72 0.76  CALCIUM 8.7* 7.8*  MG  --  1.9    CBG: No results for input(s): "GLUCAP" in the last 168 hours.  Recent Results (from the past 240 hour(s))  Blood Culture (routine x 2)     Status: None (Preliminary result)   Collection Time:  04/26/22  4:22 AM   Specimen: BLOOD LEFT WRIST  Result Value Ref Range Status   Specimen Description   Final    BLOOD LEFT WRIST BOTTLES DRAWN AEROBIC AND ANAEROBIC Performed at [facility], [address], [place], [place] [address]    Special Requests   Final    Blood Culture adequate volume Performed at [facility], [address], [place], [place] [address]    Culture  Setup Time   Final    GRAM NEGATIVE RODS IN BOTH AEROBIC AND ANAEROBIC BOTTLES Gram Stain Report Called to,Read Back By and Verified With: LAWRENCE HILTON '@1700'$  04/26/22 BY GMCGEEHON. WORK DONE AT [facility]. Performed at [facility], [address] [place], [place] [address]    Culture [place]  Final   Report Status PENDING  Incomplete  Urine Culture     Status: Abnormal (Preliminary result)   Collection Time: 04/26/22  4:22 AM   Specimen: In/Out Cath Urine  Result Value Ref Range Status   Specimen Description   Final    IN/OUT CATH URINE Performed at [facility], [address], [place], [place] [address]    Special Requests   Final    NONE Performed at [facility], [address], [place], [place] [address]    Culture >=100,000 COLONIES/mL ESCHERICHIA COLI (A)  Final   Report Status PENDING  Incomplete  Resp Panel by RT-PCR (Flu A&B, Covid) Anterior Nasal Swab     Status: None   Collection Time: 04/26/22  4:23 AM   Specimen: Anterior Nasal Swab  Result Value Ref Range Status   SARS Coronavirus 2 by RT PCR NEGATIVE NEGATIVE Final    Comment: (NOTE) SARS-CoV-2 target nucleic acids are NOT DETECTED.  The SARS-CoV-2 RNA is generally detectable in upper respiratory specimens during the acute phase of infection. The lowest concentration of SARS-CoV-2 viral copies this assay can detect is 138 copies/mL. A negative result does not preclude SARS-Cov-2 infection and should not be used as the sole basis for treatment or other patient management decisions. A negative result may occur with  improper specimen  collection/handling, submission of specimen other than nasopharyngeal swab, presence of viral mutation(s) within the areas targeted by this assay, and inadequate number of viral copies(<138 copies/mL). A negative result must be combined with clinical observations, patient history, and epidemiological information. The expected result is Negative.  Fact Sheet for Patients:  EntrepreneurPulse.com.au  Fact Sheet for Healthcare Providers:  IncredibleEmployment.be  This test is no t yet approved or cleared by the [place] FDA and  has been authorized for detection and/or diagnosis of SARS-CoV-2 by FDA under an Emergency Use Authorization (EUA). This EUA will remain  in effect (meaning this test can be used) for the duration of the COVID-19 declaration under Section 564(b)(1) of the Act, 21 U.S.C.section 360bbb-3(b)(1), unless the authorization is terminated  or revoked sooner.  Influenza A by PCR NEGATIVE NEGATIVE Final   Influenza B by PCR NEGATIVE NEGATIVE Final    Comment: (NOTE) The Xpert Xpress SARS-CoV-2/FLU/RSV plus assay is intended as an aid in the diagnosis of influenza from Nasopharyngeal swab specimens and should not be used as a sole basis for treatment. Nasal washings and aspirates are unacceptable for Xpert Xpress SARS-CoV-2/FLU/RSV testing.  Fact Sheet for Patients: EntrepreneurPulse.com.au  Fact Sheet for Healthcare Providers: IncredibleEmployment.be  This test is not yet approved or cleared by the [place] FDA and has been authorized for detection and/or diagnosis of SARS-CoV-2 by FDA under an Emergency Use Authorization (EUA). This EUA will remain in effect (meaning this test can be used) for the duration of the COVID-19 declaration under Section 564(b)(1) of the Act, 21 U.S.C. section 360bbb-3(b)(1), unless the authorization is terminated or revoked.  Performed at [facility], [address] [place], [place] [address]   Blood Culture (routine x 2)     Status: Abnormal (Preliminary result)   Collection Time: 04/26/22  4:27 AM   Specimen: BLOOD RIGHT ARM  Result Value Ref Range Status   Specimen Description   Final    BLOOD RIGHT ARM BOTTLES DRAWN AEROBIC AND ANAEROBIC Performed at [facility], [address], [place], [place] [address]    Special Requests   Final    Blood Culture adequate volume Performed at [facility], [address], [place], [place] [address]    Culture  Setup Time   Final    GRAM NEGATIVE RODS IN BOTH AEROBIC AND ANAEROBIC BOTTLES Gram Stain Report Called to,Read Back By and Verified With: LAWRENCE HILTON '@1700'$  04/26/22 BY GMCGEEHON. WORK DONE AT [facility]. CRITICAL RESULT CALLED TO, READ BACK BY AND VERIFIED WITH: RN CODY KINDLEY ON 04/26/22 @ 2134 BY DRT    Culture (A)  Final    ESCHERICHIA COLI SUSCEPTIBILITIES TO FOLLOW Performed at [facility], [address], [place], [place] [address]    Report Status PENDING  Incomplete  Blood Culture ID Panel (Reflexed)     Status: Abnormal   Collection Time: 04/26/22  4:27 AM  Result Value Ref Range Status   Enterococcus faecalis NOT DETECTED NOT DETECTED Final   Enterococcus Faecium NOT DETECTED NOT DETECTED Final   Listeria monocytogenes NOT DETECTED NOT DETECTED Final   Staphylococcus species NOT DETECTED NOT DETECTED Final   Staphylococcus aureus (BCID) NOT DETECTED NOT DETECTED Final   Staphylococcus epidermidis NOT DETECTED NOT DETECTED Final   Staphylococcus lugdunensis NOT DETECTED NOT DETECTED Final   Streptococcus species NOT DETECTED NOT DETECTED Final   Streptococcus agalactiae NOT DETECTED NOT DETECTED Final   Streptococcus pneumoniae NOT DETECTED NOT DETECTED Final   Streptococcus pyogenes NOT DETECTED NOT DETECTED Final   A.calcoaceticus-baumannii NOT DETECTED NOT DETECTED Final   Bacteroides fragilis NOT DETECTED NOT DETECTED Final   Enterobacterales DETECTED (A)  NOT DETECTED Final    Comment: Enterobacterales represent a large order of gram negative bacteria, not a single organism. CRITICAL RESULT CALLED TO, READ BACK BY AND VERIFIED WITH: RN CODY KINDLEY ON 04/26/22 @ 2134 BY DRT    Enterobacter cloacae complex NOT DETECTED NOT DETECTED Final   Escherichia coli DETECTED (A) NOT DETECTED Final    Comment: CRITICAL RESULT CALLED TO, READ BACK BY AND VERIFIED WITH: RN CODY KINDLEY ON 04/26/22 @ 2134 BY DRT    Klebsiella aerogenes NOT DETECTED NOT DETECTED Final   Klebsiella oxytoca NOT DETECTED NOT DETECTED Final   Klebsiella pneumoniae NOT DETECTED  NOT DETECTED Final   Proteus species NOT DETECTED NOT DETECTED Final   Salmonella species NOT DETECTED NOT DETECTED Final   Serratia marcescens NOT DETECTED NOT DETECTED Final   Haemophilus influenzae NOT DETECTED NOT DETECTED Final   Neisseria meningitidis NOT DETECTED NOT DETECTED Final   Pseudomonas aeruginosa NOT DETECTED NOT DETECTED Final   Stenotrophomonas maltophilia NOT DETECTED NOT DETECTED Final   Candida albicans NOT DETECTED NOT DETECTED Final   Candida auris NOT DETECTED NOT DETECTED Final   Candida glabrata NOT DETECTED NOT DETECTED Final   Candida krusei NOT DETECTED NOT DETECTED Final   Candida parapsilosis NOT DETECTED NOT DETECTED Final   Candida tropicalis NOT DETECTED NOT DETECTED Final   Cryptococcus neoformans/gattii NOT DETECTED NOT DETECTED Final   CTX-M ESBL NOT DETECTED NOT DETECTED Final   Carbapenem resistance IMP NOT DETECTED NOT DETECTED Final   Carbapenem resistance KPC NOT DETECTED NOT DETECTED Final   Carbapenem resistance NDM NOT DETECTED NOT DETECTED Final   Carbapenem resist OXA 48 LIKE NOT DETECTED NOT DETECTED Final   Carbapenem resistance VIM NOT DETECTED NOT DETECTED Final    Comment: Performed at [facility], [address] [place], [place] [address]  MRSA Next Gen by PCR, Nasal     Status: None   Collection Time: 04/26/22  9:41 AM   Specimen: Nasal  Mucosa; Nasal Swab  Result Value Ref Range Status   MRSA by PCR Next Gen NOT DETECTED NOT DETECTED Final    Comment: (NOTE) The GeneXpert MRSA Assay (FDA approved for NASAL specimens only), is one component of a comprehensive MRSA colonization surveillance program. It is not intended to diagnose MRSA infection nor to guide or monitor treatment for MRSA infections. Test performance is not FDA approved in patients less than 59 years old. Performed at [facility], [address] [place], [place] [address]      Radiology Studies: [facility] Chest [place] 1 View  Result Date: 04/26/2022 CLINICAL DATA:  88 year old male with possible sepsis. EXAM: PORTABLE CHEST 1 VIEW COMPARISON:  Portable chest 07/06/2019 and earlier. FINDINGS: Portable AP semi upright view at 0430 hours. Similar low lung volumes and mild elevation of the right hemidiaphragm. Stable cardiac size and mediastinal contours. Calcified aortic atherosclerosis. Cardiac size within normal limits. Visualized tracheal air column is within normal limits. Lung ventilation appears stable since 2020. No pneumothorax or acute pulmonary opacity. Negative visible bowel gas. No acute osseous abnormality identified. IMPRESSION: Chronically low lung volumes.  No acute cardiopulmonary abnormality. Electronically Signed   By: Genevie Ann M.D.   On: 04/26/2022 05:04    Scheduled Meds:  Chlorhexidine Gluconate Cloth  6 each Topical Daily   heparin  5,000 Units Subcutaneous Q8H   pantoprazole  40 mg Oral Daily   Continuous Infusions:  sodium chloride 125 mL/hr at 04/27/22 0918   cefTRIAXone (ROCEPHIN)  IV 2 g (04/27/22 0554)     LOS: 0 days   Critical Care Procedure Note Authorized and Performed by: Murvin Natal MD  Total Critical Care time:  40 mins Due to a high probability of clinically significant, life threatening deterioration, the patient required my highest level of preparedness to intervene emergently and I personally spent this critical care time  directly and personally managing the patient.  This critical care time included obtaining a history; examining the patient, pulse oximetry; ordering and review of studies; arranging urgent treatment with development of a management plan; evaluation of patient's response of treatment; frequent reassessment; and discussions with other  providers.  This critical care time was performed to assess and manage the high probability of imminent and life threatening deterioration that could result in multi-organ failure.  It was exclusive of separately billable procedures and treating other patients and teaching time.    Irwin Brakeman, MD How to contact the [facility] Attending or Consulting provider [place] or covering provider during after hours [place], for this patient?  Check the care team in [facility] and look for a) attending/consulting [facility] provider listed and b) the [facility] team listed Log into [email or web address] and use [place]'s universal password to access. If you do not have the password, please contact the hospital operator. Locate the [facility] provider you are looking for under [facility] and page to a number that you can be directly reached. If you still have difficulty reaching the provider, please page the [facility] (Director on Call) for the Hospitalists listed on amion for assistance.  04/27/2022, 3:43 PM

## 2022-04-27 NOTE — Assessment & Plan Note (Signed)
--   follow up BCID and C&S results:  E coli sensitive to ceftriaxone -- treated with high dose IV ceftriaxone and he is clinically improved -- DC home today to complete course of oral cefdinir x 7 d

## 2022-04-28 DIAGNOSIS — E782 Mixed hyperlipidemia: Secondary | ICD-10-CM | POA: Diagnosis not present

## 2022-04-28 DIAGNOSIS — A4150 Gram-negative sepsis, unspecified: Secondary | ICD-10-CM | POA: Diagnosis not present

## 2022-04-28 DIAGNOSIS — N3000 Acute cystitis without hematuria: Secondary | ICD-10-CM | POA: Diagnosis not present

## 2022-04-28 DIAGNOSIS — R7881 Bacteremia: Secondary | ICD-10-CM | POA: Diagnosis not present

## 2022-04-28 LAB — CBC WITH DIFFERENTIAL/PLATELET
Abs Immature Granulocytes: 0.03 10*3/uL (ref 0.00–0.07)
Basophils Absolute: 0 10*3/uL (ref 0.0–0.1)
Basophils Relative: 0 %
Eosinophils Absolute: 0.2 10*3/uL (ref 0.0–0.5)
Eosinophils Relative: 2 %
HCT: 38.7 % — ABNORMAL LOW (ref 39.0–52.0)
Hemoglobin: 12.2 g/dL — ABNORMAL LOW (ref 13.0–17.0)
Immature Granulocytes: 0 %
Lymphocytes Relative: 11 %
Lymphs Abs: 1 10*3/uL (ref 0.7–4.0)
MCH: 32.5 pg (ref 26.0–34.0)
MCHC: 31.5 g/dL (ref 30.0–36.0)
MCV: 103.2 fL — ABNORMAL HIGH (ref 80.0–100.0)
Monocytes Absolute: 0.8 10*3/uL (ref 0.1–1.0)
Monocytes Relative: 9 %
Neutro Abs: 7.1 10*3/uL (ref 1.7–7.7)
Neutrophils Relative %: 78 %
Platelets: 144 10*3/uL — ABNORMAL LOW (ref 150–400)
RBC: 3.75 MIL/uL — ABNORMAL LOW (ref 4.22–5.81)
RDW: 12.8 % (ref 11.5–15.5)
WBC: 9.2 10*3/uL (ref 4.0–10.5)
nRBC: 0 % (ref 0.0–0.2)

## 2022-04-28 LAB — CULTURE, BLOOD (ROUTINE X 2)
Special Requests: ADEQUATE
Special Requests: ADEQUATE

## 2022-04-28 LAB — URINE CULTURE: Culture: >=100000 — AB

## 2022-04-28 NOTE — Progress Notes (Signed)
PROGRESS NOTE   Gregory Pearson  NIO:[id number] DOB: 12-11-1927 DOA: 04/26/2022 PCP: Dettinger, Fransisca Kaufmann, MD   Chief Complaint  Patient presents with   Fever   Level of care: Med-Surg  Brief Admission History:  88 y.o. male with medical history significant of GERD, PUD, hyperlipidemia, prostate cancer status post prostatectomy, and recurrent UTIs presents the ED with a chief complaint of rigors.  Patient reports that he went to bed in his normal state of health when he woke up cold and "nervous."  When asked to describe nervous, he reports that he was shaking.  He did not have diaphoresis.  He denies any chest pain, shortness of breath.  Patient reports that he did notice throughout the night he had been getting up to void every 30 minutes, when baseline for him is to get up every 2 hours to void.  He had no hematuria.  No malodorous urine.  Patient did get a gram of Tylenol without improvement in his fever.  He does report the Tylenol helps with his osteoarthritic pain.  Patient has no further complaints.   Daughter at bedside is very concerned about why patient is having recurrent UTIs.  We discussed possible prophylactic antibiotic that could be set up with PCP.   Patient does not smoke, does not drink, does not use illicit drugs.  He is vaccinated for COVID.  Patient is DNR.   Assessment and Plan: * Sepsis due to E Coli -- continue supportive measures -- BPs remain soft but tolerating MAP down to 60 if asymptomatic -- treating infection aggressively  -- sepsis physiology resolved now  E coli bacteremia -- follow up BCID and C&S results:  E coli sensitive to ceftriaxone -- continue high dose IV ceftriaxone as he is clinically improving  E Coli UTI (urinary tract infection) -Continue ceftriaxone high dose IV  History of gastric ulcer - Secondary to NSAIDs -Avoid NSAIDs p.o. -Continue Protonix for GI protection   Hyperlipidemia - Currently diet controlled, continue heart healthy  diet  DVT prophylaxis: [place] heparin Code Status: DNR Family Communication: daughter at bedside  Disposition: Status is: Inpatient Remains inpatient appropriate because: intensity, IV antibiotic required   Consultants:   Procedures:   Antimicrobials:  Ceftriaxone 2 gm IV 9/1>>  Subjective: Pt eating and drinking better.  He has no vomiting.  No CP or SOB.    Objective: Vitals:   04/28/22 0300 04/28/22 0400 04/28/22 0500 04/28/22 0600  BP: (!) 147/79 124/79 124/77 125/70  Pulse: 85 76 73 85  Resp: (!) 30 (!) 22 20 (!) 22  Temp: 98.5 F (36.9 C)     TempSrc: Oral     SpO2:  95% 95% 95%  Weight:      Height:        Intake/Output Summary (Last 24 hours) at 04/28/2022 0955 Last data filed at 04/28/2022 8182 Gross per 24 hour  Intake 3671.94 ml  Output 6200 ml  Net -2528.06 ml   Filed Weights   04/26/22 0417 04/27/22 0431  Weight: 95.3 kg 91.2 kg   Examination:  General exam: Appears calm and comfortable  Respiratory system: Clear to auscultation. Respiratory effort normal. Cardiovascular system: normal S1 & S2 heard. No JVD, murmurs, rubs, gallops or clicks. No pedal edema. Gastrointestinal system: Abdomen is nondistended, soft and nontender. No organomegaly or masses felt. Normal bowel sounds heard. Central nervous system: Alert and oriented. No focal neurological deficits. Extremities: Symmetric 5 x 5 power. Skin: No rashes, lesions or ulcers. Psychiatry: Judgement and  insight appear normal. Mood & affect appropriate.   Data Reviewed: I have personally reviewed following labs and imaging studies  CBC: Recent Labs  Lab 04/26/22 0422 04/27/22 0342 04/28/22 0321  WBC 8.4 12.2* 9.2  NEUTROABS 7.6 9.9* 7.1  HGB 13.7 11.8* 12.2*  HCT 41.8 36.8* 38.7*  MCV 100.7* 102.8* 103.2*  PLT 144* 135* 144*    Basic Metabolic Panel: Recent Labs  Lab 04/26/22 0422 04/27/22 0342  NA 138 140  K 3.8 3.9  CL 104 110  CO2 26 26  GLUCOSE 115* 103*  BUN 16 19  CREATININE  0.72 0.76  CALCIUM 8.7* 7.8*  MG  --  1.9    CBG: No results for input(s): "GLUCAP" in the last 168 hours.  Recent Results (from the past 240 hour(s))  Blood Culture (routine x 2)     Status: None (Preliminary result)   Collection Time: 04/26/22  4:22 AM   Specimen: BLOOD LEFT WRIST  Result Value Ref Range Status   Specimen Description   Final    BLOOD LEFT WRIST BOTTLES DRAWN AEROBIC AND ANAEROBIC Performed at [facility], [address], [place], [place] [address]    Special Requests   Final    Blood Culture adequate volume Performed at [facility], [address], [place], [place] [address]    Culture  Setup Time   Final    GRAM NEGATIVE RODS IN BOTH AEROBIC AND ANAEROBIC BOTTLES Gram Stain Report Called to,Read Back By and Verified With: LAWRENCE HILTON '@1700'$  04/26/22 BY G[facility]GEEHON. WORK DONE AT [facility]. Performed at [facility], [address], [place], [place] [address]    Culture GRAM NEGATIVE RODS  Final   Report Status PENDING  Incomplete  Urine Culture     Status: Abnormal   Collection Time: 04/26/22  4:22 AM   Specimen: In/Out Cath Urine  Result Value Ref Range Status   Specimen Description   Final    IN/OUT CATH URINE Performed at [facility], [address] [place], [place] [address]    Special Requests   Final    NONE Performed at [facility], [address], [place], [place] [address]    Culture >=100,000 COLONIES/mL ESCHERICHIA COLI (A)  Final   Report Status 04/28/2022 FINAL  Final   Organism ID, Bacteria ESCHERICHIA COLI (A)  Final      Susceptibility   Escherichia coli - MIC*    AMPICILLIN >=32 RESISTANT Resistant     CEFAZOLIN 16 SENSITIVE Sensitive     CEFEPIME <=0.12 SENSITIVE Sensitive     CEFTRIAXONE <=0.25 SENSITIVE Sensitive     CIPROFLOXACIN >=4 RESISTANT Resistant     GENTAMICIN <=1 SENSITIVE Sensitive     IMIPENEM <=0.25 SENSITIVE Sensitive     NITROFURANTOIN <=16 SENSITIVE Sensitive     TRIMETH/SULFA >=320 RESISTANT Resistant      AMPICILLIN/SULBACTAM 16 INTERMEDIATE Intermediate     PIP/TAZO <=4 SENSITIVE Sensitive     * >=100,000 COLONIES/mL ESCHERICHIA COLI  Resp Panel by RT-PCR (Flu A&B, Covid) Anterior Nasal Swab     Status: None   Collection Time: 04/26/22  4:23 AM   Specimen: Anterior Nasal Swab  Result Value Ref Range Status   SARS Coronavirus 2 by RT PCR NEGATIVE NEGATIVE Final    Comment: (NOTE) SARS-CoV-2 target nucleic acids are NOT DETECTED.  The SARS-CoV-2 RNA is generally detectable in upper respiratory specimens during the acute phase of infection. The lowest concentration of SARS-CoV-2 viral copies this assay can detect is 138 copies/mL.  A negative result does not preclude SARS-Cov-2 infection and should not be used as the sole basis for treatment or other patient management decisions. A negative result may occur with  improper specimen collection/handling, submission of specimen other than nasopharyngeal swab, presence of viral mutation(s) within the areas targeted by this assay, and inadequate number of viral copies(<138 copies/mL). A negative result must be combined with clinical observations, patient history, and epidemiological information. The expected result is Negative.  Fact Sheet for Patients:  EntrepreneurPulse.com.au  Fact Sheet for Healthcare Providers:  IncredibleEmployment.be  This test is no t yet approved or cleared by the [place] FDA and  has been authorized for detection and/or diagnosis of SARS-CoV-2 by FDA under an Emergency Use Authorization (EUA). This EUA will remain  in effect (meaning this test can be used) for the duration of the COVID-19 declaration under Section 564(b)(1) of the Act, 21 U.S.C.section 360bbb-3(b)(1), unless the authorization is terminated  or revoked sooner.       Influenza A by PCR NEGATIVE NEGATIVE Final   Influenza B by PCR NEGATIVE NEGATIVE Final    Comment: (NOTE) The Xpert Xpress  SARS-CoV-2/FLU/RSV plus assay is intended as an aid in the diagnosis of influenza from Nasopharyngeal swab specimens and should not be used as a sole basis for treatment. Nasal washings and aspirates are unacceptable for Xpert Xpress SARS-CoV-2/FLU/RSV testing.  Fact Sheet for Patients: EntrepreneurPulse.com.au  Fact Sheet for Healthcare Providers: IncredibleEmployment.be  This test is not yet approved or cleared by the [place] FDA and has been authorized for detection and/or diagnosis of SARS-CoV-2 by FDA under an Emergency Use Authorization (EUA). This EUA will remain in effect (meaning this test can be used) for the duration of the COVID-19 declaration under Section 564(b)(1) of the Act, 21 U.S.C. section 360bbb-3(b)(1), unless the authorization is terminated or revoked.  Performed at [facility], [address] [place], [place] [address]   Blood Culture (routine x 2)     Status: Abnormal   Collection Time: 04/26/22  4:27 AM   Specimen: BLOOD RIGHT ARM  Result Value Ref Range Status   Specimen Description   Final    BLOOD RIGHT ARM BOTTLES DRAWN AEROBIC AND ANAEROBIC Performed at [facility], [address], [place], [place] [address]    Special Requests   Final    Blood Culture adequate volume Performed at [facility], [address], [place],  [address]    Culture  Setup Time   Final    GRAM NEGATIVE RODS IN BOTH AEROBIC AND ANAEROBIC BOTTLES Gram Stain Report Called to,Read Back By and Verified With: LAWRENCE HILTON '@1700'$  04/26/22 BY GMCGEEHON. WORK DONE AT [facility]. CRITICAL RESULT CALLED TO, READ BACK BY AND VERIFIED WITH: RN CODY KINDLEY ON 04/26/22 @ 2134 BY DRT Performed at [facility], [place] [address], [place], [place] [address]    Culture ESCHERICHIA COLI (A)  Final   Report Status 04/28/2022 FINAL  Final   Organism ID, Bacteria ESCHERICHIA COLI  Final      Susceptibility   Escherichia coli - MIC*    AMPICILLIN  >=32 RESISTANT Resistant     CEFAZOLIN 16 SENSITIVE Sensitive     CEFEPIME <=0.12 SENSITIVE Sensitive     CEFTAZIDIME <=1 SENSITIVE Sensitive     CEFTRIAXONE <=0.25 SENSITIVE Sensitive     CIPROFLOXACIN >=4 RESISTANT Resistant     GENTAMICIN <=1 SENSITIVE Sensitive     IMIPENEM <=0.25 SENSITIVE Sensitive     TRIMETH/SULFA >=320 RESISTANT Resistant  AMPICILLIN/SULBACTAM 16 INTERMEDIATE Intermediate     PIP/TAZO <=4 SENSITIVE Sensitive     * ESCHERICHIA COLI  Blood Culture ID Panel (Reflexed)     Status: Abnormal   Collection Time: 04/26/22  4:27 AM  Result Value Ref Range Status   Enterococcus faecalis NOT DETECTED NOT DETECTED Final   Enterococcus Faecium NOT DETECTED NOT DETECTED Final   Listeria monocytogenes NOT DETECTED NOT DETECTED Final   Staphylococcus species NOT DETECTED NOT DETECTED Final   Staphylococcus aureus (BCID) NOT DETECTED NOT DETECTED Final   Staphylococcus epidermidis NOT DETECTED NOT DETECTED Final   Staphylococcus lugdunensis NOT DETECTED NOT DETECTED Final   Streptococcus species NOT DETECTED NOT DETECTED Final   Streptococcus agalactiae NOT DETECTED NOT DETECTED Final   Streptococcus pneumoniae NOT DETECTED NOT DETECTED Final   Streptococcus pyogenes NOT DETECTED NOT DETECTED Final   A.calcoaceticus-baumannii NOT DETECTED NOT DETECTED Final   Bacteroides fragilis NOT DETECTED NOT DETECTED Final   Enterobacterales DETECTED (A) NOT DETECTED Final    Comment: Enterobacterales represent a large order of gram negative bacteria, not a single organism. CRITICAL RESULT CALLED TO, READ BACK BY AND VERIFIED WITH: RN CODY KINDLEY ON 04/26/22 @ 2134 BY DRT    Enterobacter cloacae complex NOT DETECTED NOT DETECTED Final   Escherichia coli DETECTED (A) NOT DETECTED Final    Comment: CRITICAL RESULT CALLED TO, READ BACK BY AND VERIFIED WITH: RN CODY KINDLEY ON 04/26/22 @ 2134 BY DRT    Klebsiella aerogenes NOT DETECTED NOT DETECTED Final   Klebsiella oxytoca NOT  DETECTED NOT DETECTED Final   Klebsiella pneumoniae NOT DETECTED NOT DETECTED Final   Proteus species NOT DETECTED NOT DETECTED Final   Salmonella species NOT DETECTED NOT DETECTED Final   Serratia marcescens NOT DETECTED NOT DETECTED Final   Haemophilus influenzae NOT DETECTED NOT DETECTED Final   Neisseria meningitidis NOT DETECTED NOT DETECTED Final   Pseudomonas aeruginosa NOT DETECTED NOT DETECTED Final   Stenotrophomonas maltophilia NOT DETECTED NOT DETECTED Final   Candida albicans NOT DETECTED NOT DETECTED Final   Candida auris NOT DETECTED NOT DETECTED Final   Candida glabrata NOT DETECTED NOT DETECTED Final   Candida krusei NOT DETECTED NOT DETECTED Final   Candida parapsilosis NOT DETECTED NOT DETECTED Final   Candida tropicalis NOT DETECTED NOT DETECTED Final   Cryptococcus neoformans/gattii NOT DETECTED NOT DETECTED Final   CTX-M ESBL NOT DETECTED NOT DETECTED Final   Carbapenem resistance IMP NOT DETECTED NOT DETECTED Final   Carbapenem resistance KPC NOT DETECTED NOT DETECTED Final   Carbapenem resistance NDM NOT DETECTED NOT DETECTED Final   Carbapenem resist OXA 48 LIKE NOT DETECTED NOT DETECTED Final   Carbapenem resistance VIM NOT DETECTED NOT DETECTED Final    Comment: Performed at [facility], [address], [place], [place] [address]  MRSA Next Gen by PCR, Nasal     Status: None   Collection Time: 04/26/22  9:41 AM   Specimen: Nasal Mucosa; Nasal Swab  Result Value Ref Range Status   MRSA by PCR Next Gen NOT DETECTED NOT DETECTED Final    Comment: (NOTE) The GeneXpert MRSA Assay (FDA approved for NASAL specimens only), is one component of a comprehensive MRSA colonization surveillance program. It is not intended to diagnose MRSA infection nor to guide or monitor treatment for MRSA infections. Test performance is not FDA approved in patients less than 12 years old. Performed at [facility], [address] [place], [place] [address]      Radiology  Studies: No results found.  Scheduled Meds:  Chlorhexidine Gluconate Cloth  6 each Topical Daily   heparin  5,000 Units Subcutaneous Q8H   pantoprazole  40 mg Oral Daily   Continuous Infusions:  sodium chloride 125 mL/hr at 04/27/22 0918   cefTRIAXone (ROCEPHIN)  IV 2 g (04/28/22 0611)     LOS: 1 day   Time spent: 35 mins  Alaisa Moffitt Wynetta Emery, MD How to contact the [facility] Attending or Consulting provider [place] or covering provider during after hours [place], for this patient?  Check the care team in [facility] and look for a) attending/consulting [facility] provider listed and b) the [facility] team listed Log into [email or web address] and use [place]'s universal password to access. If you do not have the password, please contact the hospital operator. Locate the [facility] provider you are looking for under [facility] and page to a number that you can be directly reached. If you still have difficulty reaching the provider, please page the [facility] (Director on Call) for the Hospitalists listed on amion for assistance.  04/28/2022, 9:55 AM

## 2022-04-29 DIAGNOSIS — B9620 Unspecified Escherichia coli [E. coli] as the cause of diseases classified elsewhere: Secondary | ICD-10-CM | POA: Diagnosis not present

## 2022-04-29 DIAGNOSIS — R7881 Bacteremia: Secondary | ICD-10-CM | POA: Diagnosis not present

## 2022-04-29 DIAGNOSIS — N390 Urinary tract infection, site not specified: Secondary | ICD-10-CM | POA: Diagnosis not present

## 2022-04-29 DIAGNOSIS — A4150 Gram-negative sepsis, unspecified: Secondary | ICD-10-CM | POA: Diagnosis not present

## 2022-04-29 MED ORDER — SACCHAROMYCES BOULARDII 250 MG PO CAPS: 250.0000 mg | ORAL_CAPSULE | Freq: Two times a day (BID) | ORAL | 0 refills | Status: AC

## 2022-04-29 MED ORDER — NABUMETONE 500 MG PO TABS: 500.0000 mg | ORAL_TABLET | Freq: Two times a day (BID) | ORAL | 0 refills | Status: DC | PRN

## 2022-04-29 MED ORDER — PANTOPRAZOLE SODIUM 40 MG PO TBEC: 40.0000 mg | DELAYED_RELEASE_TABLET | Freq: Every day | ORAL | 1 refills | Status: DC

## 2022-04-29 MED ORDER — CEFDINIR 300 MG PO CAPS
300.0000 mg | ORAL_CAPSULE | Freq: Two times a day (BID) | ORAL | 0 refills | Status: AC
Start: 2022-04-29 — End: 2022-05-06

## 2022-04-29 NOTE — Care Management Important Message (Signed)
Important Message  Patient Details  Name: Gregory Pearson MRN: [id number] Date of Birth: 02-Sep-1927   Medicare Important Message Given:  Yes     [name] 04/29/2022, 11:01 AM

## 2022-04-29 NOTE — Discharge Summary (Signed)
Physician Discharge Summary  Geno Sydnor OMV:[id number] DOB: 10-14-1927 DOA: 04/26/2022  PCP: Dettinger, Fransisca Kaufmann, MD  Admit date: 04/26/2022 Discharge date: 04/29/2022  Admitted From:  Home  Disposition:  Home  Recommendations for Outpatient Follow-up:  Follow up with PCP in 1 weeks  Home Health: PT  Discharge Condition: STABLE   CODE STATUS: DNR DIET: resume prior home diet    Brief Hospitalization Summary: Please see all hospital notes, images, labs for full details of the hospitalization. ADMISSION HPI: 88 y.o. male with medical history significant of GERD, PUD, hyperlipidemia, prostate cancer status post prostatectomy, and recurrent UTIs presents the ED with a chief complaint of rigors.  Patient reports that he went to bed in his normal state of health when he woke up cold and "nervous."  When asked to describe nervous, he reports that he was shaking.  He did not have diaphoresis.  He denies any chest pain, shortness of breath.  Patient reports that he did notice throughout the night he had been getting up to void every 30 minutes, when baseline for him is to get up every 2 hours to void.  He had no hematuria.  No malodorous urine.  Patient did get a gram of Tylenol without improvement in his fever.  He does report the Tylenol helps with his osteoarthritic pain.  Patient has no further complaints.   Daughter at bedside is very concerned about why patient is having recurrent UTIs.  We discussed possible prophylactic antibiotic that could be set up with PCP.   Patient does not smoke, does not drink, does not use illicit drugs.  He is vaccinated for COVID.  Patient is DNR.  Hospital Course by Problem list   Assessment and Plan: * Sepsis due to E Coli -- treated with supportive measures -- BPs have rebounded now back to normal readings -- treated infection aggressively  -- sepsis physiology resolved now  E coli bacteremia -- follow up BCID and C&S results:  E coli sensitive to  ceftriaxone -- treated with high dose IV ceftriaxone and he is clinically improved -- DC home today to complete course of oral cefdinir x 7 d  E Coli UTI (urinary tract infection) -treated with ceftriaxone high dose IV and DC home on oral cefdinir  History of gastric ulcer -Continue Protonix for GI protection   Hyperlipidemia - Currently diet controlled, continue heart healthy diet  Discharge Diagnoses:  Principal Problem:   Sepsis due to E Coli Active Problems:   E coli bacteremia   Hyperlipidemia   History of gastric ulcer   E Coli UTI (urinary tract infection)   Sepsis secondary to UTI [facility])   Discharge Instructions:  Allergies as of 04/29/2022   No Known Allergies      Medication List     TAKE these medications    acetaminophen 500 MG tablet Commonly known as: TYLENOL Take 1 tablet (500 mg total) by mouth every 6 (six) hours as needed. What changed: reasons to take this   cefdinir 300 MG capsule Commonly known as: OMNICEF Take 1 capsule (300 mg total) by mouth 2 (two) times daily for 7 days.   multivitamin tablet Take 1 tablet by mouth daily.   nabumetone 500 MG tablet Commonly known as: RELAFEN Take 1 tablet (500 mg total) by mouth 2 (two) times daily as needed for mild pain. What changed: See the new instructions.   pantoprazole 40 MG tablet Commonly known as: PROTONIX Take 1 tablet (40 mg total) by mouth daily. Start  taking on: April 30, 2022 What changed: See the new instructions.   saccharomyces boulardii 250 MG capsule Commonly known as: Florastor Take 1 capsule (250 mg total) by mouth 2 (two) times daily for 14 days.        Follow-up Information     [facility] Follow up.   Why: Wil contact you to schedule home health visits.        Dettinger, Fransisca Kaufmann, MD. Schedule an appointment as soon as possible for a visit in 1 week(s).   Specialties: Family Medicine, Cardiology Why: Hospital Follow Up Contact information: [place] [address] [phone]                No Known Allergies Allergies as of 04/29/2022   No Known Allergies      Medication List     TAKE these medications    acetaminophen 500 MG tablet Commonly known as: TYLENOL Take 1 tablet (500 mg total) by mouth every 6 (six) hours as needed. What changed: reasons to take this   cefdinir 300 MG capsule Commonly known as: OMNICEF Take 1 capsule (300 mg total) by mouth 2 (two) times daily for 7 days.   multivitamin tablet Take 1 tablet by mouth daily.   nabumetone 500 MG tablet Commonly known as: RELAFEN Take 1 tablet (500 mg total) by mouth 2 (two) times daily as needed for mild pain. What changed: See the new instructions.   pantoprazole 40 MG tablet Commonly known as: PROTONIX Take 1 tablet (40 mg total) by mouth daily. Start taking on: April 30, 2022 What changed: See the new instructions.   saccharomyces boulardii 250 MG capsule Commonly known as: Florastor Take 1 capsule (250 mg total) by mouth 2 (two) times daily for 14 days.        Procedures/Studies: DG Chest Port 1 View  Result Date: 04/26/2022 CLINICAL DATA:  88 year old male with possible sepsis. EXAM: PORTABLE CHEST 1 VIEW COMPARISON:  Portable chest 07/06/2019 and earlier. FINDINGS: Portable AP semi upright view at 0430 hours. Similar low lung volumes and mild elevation of the right hemidiaphragm. Stable cardiac size and mediastinal contours. Calcified aortic atherosclerosis. Cardiac size within normal limits. Visualized tracheal air column is within normal limits. Lung ventilation appears stable since 2020. No pneumothorax or acute pulmonary opacity. Negative visible bowel gas. No acute osseous abnormality identified. IMPRESSION: Chronically low lung volumes.  No acute cardiopulmonary abnormality. Electronically Signed   By: Genevie Ann M.D.   On: 04/26/2022 05:04     Subjective: Pt reports that he is feeling much better today.  He is eating  and drinking and ambulating on his own.  No fever or chills.    Discharge Exam: Vitals:   04/29/22 0535 04/29/22 0812  BP: 136/82 130/86  Pulse: 93 88  Resp: 19   Temp: 98.4 F (36.9 C)   SpO2: 97% 93%   Vitals:   04/28/22 1930 04/28/22 2335 04/29/22 0535 04/29/22 0812  BP: 118/70 101/64 136/82 130/86  Pulse: 84 87 93 88  Resp: 19  19   Temp: 98.4 F (36.9 C) 98.8 F (37.1 C) 98.4 F (36.9 C)   TempSrc: Oral     SpO2: 95% 94% 97% 93%  Weight:      Height:       General: Pt is alert, awake, not in acute distress Cardiovascular: normal S1/S2 +, no rubs, no gallops Respiratory: CTA bilaterally, no wheezing, no rhonchi Abdominal: Soft, NT, ND, bowel sounds +  Extremities: no edema, no cyanosis   The results of significant diagnostics from this hospitalization (including imaging, microbiology, ancillary and laboratory) are listed below for reference.     Microbiology: Recent Results (from the past 240 hour(s))  Blood Culture (routine x 2)     Status: Abnormal   Collection Time: 04/26/22  4:22 AM   Specimen: BLOOD LEFT WRIST  Result Value Ref Range Status   Specimen Description   Final    BLOOD LEFT WRIST BOTTLES DRAWN AEROBIC AND ANAEROBIC Performed at [facility], [address], [place], [place] [address]    Special Requests   Final    Blood Culture adequate volume Performed at [facility], [address], [place], [place] [address]    Culture  Setup Time   Final    GRAM NEGATIVE RODS IN BOTH AEROBIC AND ANAEROBIC BOTTLES Gram Stain Report Called to,Read Back By and Verified With: LAWRENCE HILTON '@1700'$  04/26/22 BY GMCGEEHON. WORK DONE AT [facility]. Performed at [facility], [address] [place], [place] [address]    Culture (A)  Final    ESCHERICHIA COLI SUSCEPTIBILITIES PERFORMED ON PREVIOUS CULTURE WITHIN THE LAST 5 DAYS. Performed at [facility], [place] [address], [place], [place] [address]    Report Status 04/28/2022 FINAL  Final  Urine Culture     Status:  Abnormal   Collection Time: 04/26/22  4:22 AM   Specimen: In/Out Cath Urine  Result Value Ref Range Status   Specimen Description   Final    IN/OUT CATH URINE Performed at [facility], [address], [place], [place] [address]    Special Requests   Final    NONE Performed at [facility], [address] [place], [place] [address]    Culture >=100,000 COLONIES/mL ESCHERICHIA COLI (A)  Final   Report Status 04/28/2022 FINAL  Final   Organism ID, Bacteria ESCHERICHIA COLI (A)  Final      Susceptibility   Escherichia coli - MIC*    AMPICILLIN >=32 RESISTANT Resistant     CEFAZOLIN 16 SENSITIVE Sensitive     CEFEPIME <=0.12 SENSITIVE Sensitive     CEFTRIAXONE <=0.25 SENSITIVE Sensitive     CIPROFLOXACIN >=4 RESISTANT Resistant     GENTAMICIN <=1 SENSITIVE Sensitive     IMIPENEM <=0.25 SENSITIVE Sensitive     NITROFURANTOIN <=16 SENSITIVE Sensitive     TRIMETH/SULFA >=320 RESISTANT Resistant     AMPICILLIN/SULBACTAM 16 INTERMEDIATE Intermediate     PIP/TAZO <=4 SENSITIVE Sensitive     * >=100,000 COLONIES/mL ESCHERICHIA COLI  Resp Panel by RT-PCR (Flu A&B, Covid) Anterior Nasal Swab     Status: None   Collection Time: 04/26/22  4:23 AM   Specimen: Anterior Nasal Swab  Result Value Ref Range Status   SARS Coronavirus 2 by RT PCR NEGATIVE NEGATIVE Final    Comment: (NOTE) SARS-CoV-2 target nucleic acids are NOT DETECTED.  The SARS-CoV-2 RNA is generally detectable in upper respiratory specimens during the acute phase of infection. The lowest concentration of SARS-CoV-2 viral copies this assay can detect is 138 copies/mL. A negative result does not preclude SARS-Cov-2 infection and should not be used as the sole basis for treatment or other patient management decisions. A negative result may occur with  improper specimen collection/handling, submission of specimen other than nasopharyngeal swab, presence of viral mutation(s) within the areas targeted by this assay, and inadequate  number of viral copies(<138 copies/mL). A negative result must be combined with clinical observations, patient history, and epidemiological  information. The expected result is Negative.  Fact Sheet for Patients:  EntrepreneurPulse.com.au  Fact Sheet for Healthcare Providers:  IncredibleEmployment.be  This test is no t yet approved or cleared by the [place] FDA and  has been authorized for detection and/or diagnosis of SARS-CoV-2 by FDA under an Emergency Use Authorization (EUA). This EUA will remain  in effect (meaning this test can be used) for the duration of the COVID-19 declaration under Section 564(b)(1) of the Act, 21 U.S.C.section 360bbb-3(b)(1), unless the authorization is terminated  or revoked sooner.       Influenza A by PCR NEGATIVE NEGATIVE Final   Influenza B by PCR NEGATIVE NEGATIVE Final    Comment: (NOTE) The Xpert Xpress SARS-CoV-2/FLU/RSV plus assay is intended as an aid in the diagnosis of influenza from Nasopharyngeal swab specimens and should not be used as a sole basis for treatment. Nasal washings and aspirates are unacceptable for Xpert Xpress SARS-CoV-2/FLU/RSV testing.  Fact Sheet for Patients: EntrepreneurPulse.com.au  Fact Sheet for Healthcare Providers: IncredibleEmployment.be  This test is not yet approved or cleared by the [place] FDA and has been authorized for detection and/or diagnosis of SARS-CoV-2 by FDA under an Emergency Use Authorization (EUA). This EUA will remain in effect (meaning this test can be used) for the duration of the COVID-19 declaration under Section 564(b)(1) of the Act, 21 U.S.C. section 360bbb-3(b)(1), unless the authorization is terminated or revoked.  Performed at [facility], [address], [place], [place] [address]   Blood Culture (routine x 2)     Status: Abnormal   Collection Time: 04/26/22  4:27 AM   Specimen: BLOOD RIGHT  ARM  Result Value Ref Range Status   Specimen Description   Final    BLOOD RIGHT ARM BOTTLES DRAWN AEROBIC AND ANAEROBIC Performed at [facility], [address], [place], [place] [address]    Special Requests   Final    Blood Culture adequate volume Performed at [facility], [address], [place], [place] [address]    Culture  Setup Time   Final    GRAM NEGATIVE RODS IN BOTH AEROBIC AND ANAEROBIC BOTTLES Gram Stain Report Called to,Read Back By and Verified With: LAWRENCE HILTON '@1700'$  04/26/22 BY GMCGEEHON. WORK DONE AT [facility]. CRITICAL RESULT CALLED TO, READ BACK BY AND VERIFIED WITH: RN CODY KINDLEY ON 04/26/22 @ 2134 BY DRT Performed at [facility], [place] [address], [place], [place] [address]    Culture ESCHERICHIA COLI (A)  Final   Report Status 04/28/2022 FINAL  Final   Organism ID, Bacteria ESCHERICHIA COLI  Final      Susceptibility   Escherichia coli - MIC*    AMPICILLIN >=32 RESISTANT Resistant     CEFAZOLIN 16 SENSITIVE Sensitive     CEFEPIME <=0.12 SENSITIVE Sensitive     CEFTAZIDIME <=1 SENSITIVE Sensitive     CEFTRIAXONE <=0.25 SENSITIVE Sensitive     CIPROFLOXACIN >=4 RESISTANT Resistant     GENTAMICIN <=1 SENSITIVE Sensitive     IMIPENEM <=0.25 SENSITIVE Sensitive     TRIMETH/SULFA >=320 RESISTANT Resistant     AMPICILLIN/SULBACTAM 16 INTERMEDIATE Intermediate     PIP/TAZO <=4 SENSITIVE Sensitive     * ESCHERICHIA COLI  Blood Culture ID Panel (Reflexed)     Status: Abnormal   Collection Time: 04/26/22  4:27 AM  Result Value Ref Range Status   Enterococcus faecalis NOT DETECTED NOT DETECTED Final   Enterococcus Faecium NOT DETECTED NOT DETECTED Final   Listeria monocytogenes NOT DETECTED  NOT DETECTED Final   Staphylococcus species NOT DETECTED NOT DETECTED Final   Staphylococcus aureus (BCID) NOT DETECTED NOT DETECTED Final   Staphylococcus epidermidis NOT DETECTED NOT DETECTED Final   Staphylococcus lugdunensis NOT DETECTED NOT DETECTED Final    Streptococcus species NOT DETECTED NOT DETECTED Final   Streptococcus agalactiae NOT DETECTED NOT DETECTED Final   Streptococcus pneumoniae NOT DETECTED NOT DETECTED Final   Streptococcus pyogenes NOT DETECTED NOT DETECTED Final   A.calcoaceticus-baumannii NOT DETECTED NOT DETECTED Final   Bacteroides fragilis NOT DETECTED NOT DETECTED Final   Enterobacterales DETECTED (A) NOT DETECTED Final    Comment: Enterobacterales represent a large order of gram negative bacteria, not a single organism. CRITICAL RESULT CALLED TO, READ BACK BY AND VERIFIED WITH: RN CODY KINDLEY ON 04/26/22 @ 2134 BY DRT    Enterobacter cloacae complex NOT DETECTED NOT DETECTED Final   Escherichia coli DETECTED (A) NOT DETECTED Final    Comment: CRITICAL RESULT CALLED TO, READ BACK BY AND VERIFIED WITH: RN CODY KINDLEY ON 04/26/22 @ 2134 BY DRT    Klebsiella aerogenes NOT DETECTED NOT DETECTED Final   Klebsiella oxytoca NOT DETECTED NOT DETECTED Final   Klebsiella pneumoniae NOT DETECTED NOT DETECTED Final   Proteus species NOT DETECTED NOT DETECTED Final   Salmonella species NOT DETECTED NOT DETECTED Final   Serratia marcescens NOT DETECTED NOT DETECTED Final   Haemophilus influenzae NOT DETECTED NOT DETECTED Final   Neisseria meningitidis NOT DETECTED NOT DETECTED Final   Pseudomonas aeruginosa NOT DETECTED NOT DETECTED Final   Stenotrophomonas maltophilia NOT DETECTED NOT DETECTED Final   Candida albicans NOT DETECTED NOT DETECTED Final   Candida auris NOT DETECTED NOT DETECTED Final   Candida glabrata NOT DETECTED NOT DETECTED Final   Candida krusei NOT DETECTED NOT DETECTED Final   Candida parapsilosis NOT DETECTED NOT DETECTED Final   Candida tropicalis NOT DETECTED NOT DETECTED Final   Cryptococcus neoformans/gattii NOT DETECTED NOT DETECTED Final   CTX-M ESBL NOT DETECTED NOT DETECTED Final   Carbapenem resistance IMP NOT DETECTED NOT DETECTED Final   Carbapenem resistance KPC NOT DETECTED NOT DETECTED  Final   Carbapenem resistance NDM NOT DETECTED NOT DETECTED Final   Carbapenem resist OXA 48 LIKE NOT DETECTED NOT DETECTED Final   Carbapenem resistance VIM NOT DETECTED NOT DETECTED Final    Comment: Performed at [facility], [address], [place], [place] [address]  MRSA Next Gen by PCR, Nasal     Status: None   Collection Time: 04/26/22  9:41 AM   Specimen: Nasal Mucosa; Nasal Swab  Result Value Ref Range Status   MRSA by PCR Next Gen NOT DETECTED NOT DETECTED Final    Comment: (NOTE) The GeneXpert MRSA Assay (FDA approved for NASAL specimens only), is one component of a comprehensive MRSA colonization surveillance program. It is not intended to diagnose MRSA infection nor to guide or monitor treatment for MRSA infections. Test performance is not FDA approved in patients less than 28 years old. Performed at [facility], [address], [place], [place] [address]      Labs: BNP (last 3 results) No results for input(s): "BNP" in the last 8760 hours. Basic Metabolic Panel: Recent Labs  Lab 04/26/22 0422 04/27/22 0342  NA 138 140  K 3.8 3.9  CL 104 110  CO2 26 26  GLUCOSE 115* 103*  BUN 16 19  CREATININE 0.72 0.76  CALCIUM 8.7* 7.8*  MG  --  1.9   Liver Function Tests:  Recent Labs  Lab 04/26/22 0422 04/27/22 0342  AST 22 16  ALT 17 15  ALKPHOS 75 60  BILITOT 1.2 0.8  PROT 6.2* 5.0*  ALBUMIN 3.5 2.6*   No results for input(s): "LIPASE", "AMYLASE" in the last 168 hours. No results for input(s): "AMMONIA" in the last 168 hours. CBC: Recent Labs  Lab 04/26/22 0422 04/27/22 0342 04/28/22 0321  WBC 8.4 12.2* 9.2  NEUTROABS 7.6 9.9* 7.1  HGB 13.7 11.8* 12.2*  HCT 41.8 36.8* 38.7*  MCV 100.7* 102.8* 103.2*  PLT 144* 135* 144*   Cardiac Enzymes: No results for input(s): "CKTOTAL", "CKMB", "CKMBINDEX", "TROPONINI" in the last 168 hours. BNP: Invalid input(s): "POCBNP" CBG: No results for input(s): "GLUCAP" in the last 168 hours. D-Dimer No  results for input(s): "DDIMER" in the last 72 hours. Hgb A1c No results for input(s): "HGBA1C" in the last 72 hours. Lipid Profile No results for input(s): "CHOL", "HDL", "LDLCALC", "TRIG", "CHOLHDL", "LDLDIRECT" in the last 72 hours. Thyroid function studies No results for input(s): "TSH", "T4TOTAL", "T3FREE", "THYROIDAB" in the last 72 hours.  Invalid input(s): "FREET3" Anemia work up No results for input(s): "VITAMINB12", "FOLATE", "FERRITIN", "TIBC", "IRON", "RETICCTPCT" in the last 72 hours. Urinalysis    Component Value Date/Time   COLORURINE YELLOW 04/26/2022 0422   APPEARANCEUR HAZY (A) 04/26/2022 0422   APPEARANCEUR Cloudy (A) 09/04/2021 0833   LABSPEC 1.012 04/26/2022 0422   PHURINE 8.0 04/26/2022 0422   GLUCOSEU NEGATIVE 04/26/2022 0422   HGBUR MODERATE (A) 04/26/2022 0422   BILIRUBINUR NEGATIVE 04/26/2022 0422   BILIRUBINUR Negative 09/04/2021 0833   KETONESUR 20 (A) 04/26/2022 0422   PROTEINUR NEGATIVE 04/26/2022 0422   NITRITE POSITIVE (A) 04/26/2022 0422   LEUKOCYTESUR LARGE (A) 04/26/2022 0422   Sepsis Labs Recent Labs  Lab 04/26/22 0422 04/27/22 0342 04/28/22 0321  WBC 8.4 12.2* 9.2   Microbiology Recent Results (from the past 240 hour(s))  Blood Culture (routine x 2)     Status: Abnormal   Collection Time: 04/26/22  4:22 AM   Specimen: BLOOD LEFT WRIST  Result Value Ref Range Status   Specimen Description   Final    BLOOD LEFT WRIST BOTTLES DRAWN AEROBIC AND ANAEROBIC Performed at [facility], [address] [place], [place] [address]    Special Requests   Final    Blood Culture adequate volume Performed at [facility], [address] [place], [place] [address]    Culture  Setup Time   Final    GRAM NEGATIVE RODS IN BOTH AEROBIC AND ANAEROBIC BOTTLES Gram Stain Report Called to,Read Back By and Verified With: LAWRENCE HILTON '@1700'$  04/26/22 BY GMCGEEHON. WORK DONE AT [facility]. Performed at [facility], [address], [place], [place] [address]     Culture (A)  Final    ESCHERICHIA COLI SUSCEPTIBILITIES PERFORMED ON PREVIOUS CULTURE WITHIN THE LAST 5 DAYS. Performed at [facility], [place] [address], [place], [place] [address]    Report Status 04/28/2022 FINAL  Final  Urine Culture     Status: Abnormal   Collection Time: 04/26/22  4:22 AM   Specimen: In/Out Cath Urine  Result Value Ref Range Status   Specimen Description   Final    IN/OUT CATH URINE Performed at [facility], [address], [place], [place] [address]    Special Requests   Final    NONE Performed at [facility], [address] [place], [place] [address]    Culture >=100,000 COLONIES/mL ESCHERICHIA COLI (A)  Final  Report Status 04/28/2022 FINAL  Final   Organism ID, Bacteria ESCHERICHIA COLI (A)  Final      Susceptibility   Escherichia coli - MIC*    AMPICILLIN >=32 RESISTANT Resistant     CEFAZOLIN 16 SENSITIVE Sensitive     CEFEPIME <=0.12 SENSITIVE Sensitive     CEFTRIAXONE <=0.25 SENSITIVE Sensitive     CIPROFLOXACIN >=4 RESISTANT Resistant     GENTAMICIN <=1 SENSITIVE Sensitive     IMIPENEM <=0.25 SENSITIVE Sensitive     NITROFURANTOIN <=16 SENSITIVE Sensitive     TRIMETH/SULFA >=320 RESISTANT Resistant     AMPICILLIN/SULBACTAM 16 INTERMEDIATE Intermediate     PIP/TAZO <=4 SENSITIVE Sensitive     * >=100,000 COLONIES/mL ESCHERICHIA COLI  Resp Panel by RT-PCR (Flu A&B, Covid) Anterior Nasal Swab     Status: None   Collection Time: 04/26/22  4:23 AM   Specimen: Anterior Nasal Swab  Result Value Ref Range Status   SARS Coronavirus 2 by RT PCR NEGATIVE NEGATIVE Final    Comment: (NOTE) SARS-CoV-2 target nucleic acids are NOT DETECTED.  The SARS-CoV-2 RNA is generally detectable in upper respiratory specimens during the acute phase of infection. The lowest concentration of SARS-CoV-2 viral copies this assay can detect is 138 copies/mL. A negative result does not preclude SARS-Cov-2 infection and should not be used as the sole basis for  treatment or other patient management decisions. A negative result may occur with  improper specimen collection/handling, submission of specimen other than nasopharyngeal swab, presence of viral mutation(s) within the areas targeted by this assay, and inadequate number of viral copies(<138 copies/mL). A negative result must be combined with clinical observations, patient history, and epidemiological information. The expected result is Negative.  Fact Sheet for Patients:  EntrepreneurPulse.com.au  Fact Sheet for Healthcare Providers:  IncredibleEmployment.be  This test is no t yet approved or cleared by the [place] FDA and  has been authorized for detection and/or diagnosis of SARS-CoV-2 by FDA under an Emergency Use Authorization (EUA). This EUA will remain  in effect (meaning this test can be used) for the duration of the COVID-19 declaration under Section 564(b)(1) of the Act, 21 U.S.C.section 360bbb-3(b)(1), unless the authorization is terminated  or revoked sooner.       Influenza A by PCR NEGATIVE NEGATIVE Final   Influenza B by PCR NEGATIVE NEGATIVE Final    Comment: (NOTE) The Xpert Xpress SARS-CoV-2/FLU/RSV plus assay is intended as an aid in the diagnosis of influenza from Nasopharyngeal swab specimens and should not be used as a sole basis for treatment. Nasal washings and aspirates are unacceptable for Xpert Xpress SARS-CoV-2/FLU/RSV testing.  Fact Sheet for Patients: EntrepreneurPulse.com.au  Fact Sheet for Healthcare Providers: IncredibleEmployment.be  This test is not yet approved or cleared by the [place] FDA and has been authorized for detection and/or diagnosis of SARS-CoV-2 by FDA under an Emergency Use Authorization (EUA). This EUA will remain in effect (meaning this test can be used) for the duration of the COVID-19 declaration under Section 564(b)(1) of the Act, 21  U.S.C. section 360bbb-3(b)(1), unless the authorization is terminated or revoked.  Performed at [facility], [address], [place], [place] [address]   Blood Culture (routine x 2)     Status: Abnormal   Collection Time: 04/26/22  4:27 AM   Specimen: BLOOD RIGHT ARM  Result Value Ref Range Status   Specimen Description   Final    BLOOD RIGHT ARM BOTTLES DRAWN AEROBIC AND ANAEROBIC Performed at [facility],  [address], [place], [place] [address]    Special Requests   Final    Blood Culture adequate volume Performed at [facility], [address] [place],  [address]    Culture  Setup Time   Final    GRAM NEGATIVE RODS IN BOTH AEROBIC AND ANAEROBIC BOTTLES Gram Stain Report Called to,Read Back By and Verified With: LAWRENCE HILTON '@1700'$  04/26/22 BY GMCGEEHON. WORK DONE AT [facility]. CRITICAL RESULT CALLED TO, READ BACK BY AND VERIFIED WITH: RN CODY KINDLEY ON 04/26/22 @ 2134 BY DRT Performed at [facility], [place] [address] [place], [place] [address]    Culture ESCHERICHIA COLI (A)  Final   Report Status 04/28/2022 FINAL  Final   Organism ID, Bacteria ESCHERICHIA COLI  Final      Susceptibility   Escherichia coli - MIC*    AMPICILLIN >=32 RESISTANT Resistant     CEFAZOLIN 16 SENSITIVE Sensitive     CEFEPIME <=0.12 SENSITIVE Sensitive     CEFTAZIDIME <=1 SENSITIVE Sensitive     CEFTRIAXONE <=0.25 SENSITIVE Sensitive     CIPROFLOXACIN >=4 RESISTANT Resistant     GENTAMICIN <=1 SENSITIVE Sensitive     IMIPENEM <=0.25 SENSITIVE Sensitive     TRIMETH/SULFA >=320 RESISTANT Resistant     AMPICILLIN/SULBACTAM 16 INTERMEDIATE Intermediate     PIP/TAZO <=4 SENSITIVE Sensitive     * ESCHERICHIA COLI  Blood Culture ID Panel (Reflexed)     Status: Abnormal   Collection Time: 04/26/22  4:27 AM  Result Value Ref Range Status   Enterococcus faecalis NOT DETECTED NOT DETECTED Final   Enterococcus Faecium NOT DETECTED NOT DETECTED Final   Listeria monocytogenes NOT DETECTED NOT  DETECTED Final   Staphylococcus species NOT DETECTED NOT DETECTED Final   Staphylococcus aureus (BCID) NOT DETECTED NOT DETECTED Final   Staphylococcus epidermidis NOT DETECTED NOT DETECTED Final   Staphylococcus lugdunensis NOT DETECTED NOT DETECTED Final   Streptococcus species NOT DETECTED NOT DETECTED Final   Streptococcus agalactiae NOT DETECTED NOT DETECTED Final   Streptococcus pneumoniae NOT DETECTED NOT DETECTED Final   Streptococcus pyogenes NOT DETECTED NOT DETECTED Final   A.calcoaceticus-baumannii NOT DETECTED NOT DETECTED Final   Bacteroides fragilis NOT DETECTED NOT DETECTED Final   Enterobacterales DETECTED (A) NOT DETECTED Final    Comment: Enterobacterales represent a large order of gram negative bacteria, not a single organism. CRITICAL RESULT CALLED TO, READ BACK BY AND VERIFIED WITH: RN CODY KINDLEY ON 04/26/22 @ 2134 BY DRT    Enterobacter cloacae complex NOT DETECTED NOT DETECTED Final   Escherichia coli DETECTED (A) NOT DETECTED Final    Comment: CRITICAL RESULT CALLED TO, READ BACK BY AND VERIFIED WITH: RN CODY KINDLEY ON 04/26/22 @ 2134 BY DRT    Klebsiella aerogenes NOT DETECTED NOT DETECTED Final   Klebsiella oxytoca NOT DETECTED NOT DETECTED Final   Klebsiella pneumoniae NOT DETECTED NOT DETECTED Final   Proteus species NOT DETECTED NOT DETECTED Final   Salmonella species NOT DETECTED NOT DETECTED Final   Serratia marcescens NOT DETECTED NOT DETECTED Final   Haemophilus influenzae NOT DETECTED NOT DETECTED Final   Neisseria meningitidis NOT DETECTED NOT DETECTED Final   Pseudomonas aeruginosa NOT DETECTED NOT DETECTED Final   Stenotrophomonas maltophilia NOT DETECTED NOT DETECTED Final   Candida albicans NOT DETECTED NOT DETECTED Final   Candida auris NOT DETECTED NOT DETECTED Final   Candida glabrata NOT DETECTED NOT DETECTED Final   Candida krusei NOT DETECTED NOT DETECTED Final   Candida parapsilosis  NOT DETECTED NOT DETECTED Final   Candida tropicalis  NOT DETECTED NOT DETECTED Final   Cryptococcus neoformans/gattii NOT DETECTED NOT DETECTED Final   CTX-M ESBL NOT DETECTED NOT DETECTED Final   Carbapenem resistance IMP NOT DETECTED NOT DETECTED Final   Carbapenem resistance KPC NOT DETECTED NOT DETECTED Final   Carbapenem resistance NDM NOT DETECTED NOT DETECTED Final   Carbapenem resist OXA 48 LIKE NOT DETECTED NOT DETECTED Final   Carbapenem resistance VIM NOT DETECTED NOT DETECTED Final    Comment: Performed at [facility], [place] [address], [place], [place] [address]  MRSA Next Gen by PCR, Nasal     Status: None   Collection Time: 04/26/22  9:41 AM   Specimen: Nasal Mucosa; Nasal Swab  Result Value Ref Range Status   MRSA by PCR Next Gen NOT DETECTED NOT DETECTED Final    Comment: (NOTE) The GeneXpert MRSA Assay (FDA approved for NASAL specimens only), is one component of a comprehensive MRSA colonization surveillance program. It is not intended to diagnose MRSA infection nor to guide or monitor treatment for MRSA infections. Test performance is not FDA approved in patients less than 79 years old. Performed at [facility], [address] [place], [place] [address]     Time coordinating discharge: 34 mins   SIGNED:  Irwin Brakeman, MD  [facility] 04/29/2022, 10:51 AM How to contact the [facility] Attending or Consulting provider [place] or covering provider during after hours [place], for this patient?  Check the care team in [facility] and look for a) attending/consulting [facility] provider listed and b) the [facility] team listed Log into [email or web address] and use [place]'s universal password to access. If you do not have the password, please contact the hospital operator. Locate the [facility] provider you are looking for under [facility] and page to a number that you can be directly reached. If you still have difficulty reaching the provider, please page the [facility] (Director on Call) for the Hospitalists listed on amion for  assistance.

## 2022-04-29 NOTE — Discharge Instructions (Signed)
IMPORTANT INFORMATION: PAY CLOSE ATTENTION   PHYSICIAN DISCHARGE INSTRUCTIONS  Follow with Primary care provider  Dettinger, Joshua A, MD  and other consultants as instructed by your Hospitalist Physician  SEEK MEDICAL CARE OR RETURN TO EMERGENCY ROOM IF SYMPTOMS COME BACK, WORSEN OR NEW PROBLEM DEVELOPS   Please note: You were cared for by a hospitalist during your hospital stay. Every effort will be made to forward records to your primary care provider.  You can request that your primary care provider send for your hospital records if they have not received them.  Once you are discharged, your primary care physician will handle any further medical issues. Please note that NO REFILLS for any discharge medications will be authorized once you are discharged, as it is imperative that you return to your primary care physician (or establish a relationship with a primary care physician if you do not have one) for your post hospital discharge needs so that they can reassess your need for medications and monitor your lab values.  Please get a complete blood count and chemistry panel checked by your Primary MD at your next visit, and again as instructed by your Primary MD.  Get Medicines reviewed and adjusted: Please take all your medications with you for your next visit with your Primary MD  Laboratory/radiological data: Please request your Primary MD to go over all hospital tests and procedure/radiological results at the follow up, please ask your primary care provider to get all Hospital records sent to his/her office.  In some cases, they will be blood work, cultures and biopsy results pending at the time of your discharge. Please request that your primary care provider follow up on these results.  If you are diabetic, please bring your blood sugar readings with you to your follow up appointment with primary care.    Please call and make your follow up appointments as soon as possible.    Also  Note the following: If you experience worsening of your admission symptoms, develop shortness of breath, life threatening emergency, suicidal or homicidal thoughts you must seek medical attention immediately by calling 911 or calling your MD immediately  if symptoms less severe.  You must read complete instructions/literature along with all the possible adverse reactions/side effects for all the Medicines you take and that have been prescribed to you. Take any new Medicines after you have completely understood and accpet all the possible adverse reactions/side effects.   Do not drive when taking Pain medications or sleeping medications (Benzodiazepines)  Do not take more than prescribed Pain, Sleep and Anxiety Medications. It is not advisable to combine anxiety,sleep and pain medications without talking with your primary care practitioner  Special Instructions: If you have smoked or chewed Tobacco  in the last 2 yrs please stop smoking, stop any regular Alcohol  and or any Recreational drug use.  Wear Seat belts while driving.  Do not drive if taking any narcotic, mind altering or controlled substances or recreational drugs or alcohol.        

## 2022-04-29 NOTE — TOC Progression Note (Signed)
Transition of Care [facility]) - Progression Note    Patient Details  Name: Gregory Pearson MRN: [id number] Date of Birth: [birth year]  Transition of Care [facility]) CM/SW Contact  Salome Arnt,  Phone Number: 04/29/2022, 8:07 AM  Clinical Narrative:  PT recommending home health. Discussed with pt who is agreeable with no preference on agency. Referred and accepted by [facility] with [facility]. TOC will continue to follow.           Expected Discharge Plan and Services                                     HH Arranged: PT [place] Agency: [place] ([facility]) Date [place]: 04/29/22 Time [place]: [phone] Representative spoke with at [place]: [place] Determinants of Health ([place]) Interventions    Readmission Risk Interventions     No data to display

## 2022-04-29 NOTE — Progress Notes (Signed)
Physical Therapy Treatment Patient Details Name: Gregory Pearson MRN: [id number] DOB: [birth year] Today's Date: 04/29/2022   History of Present Illness Gregory Pearson is a 88 y.o. male with medical history significant of GERD, PUD, hyperlipidemia, prostate cancer status post prostatectomy, and recurrent UTIs presents the ED with a chief complaint of rigors.  Patient reports that he went to bed in his normal state of health when he woke up cold and "nervous."  When asked to describe nervous, he reports that he was shaking.  He did not have diaphoresis.  He denies any chest pain, shortness of breath.  Patient reports that he did notice throughout the night he had been getting up to void every 30 minutes, when baseline for him is to get up every 2 hours to void.  He had no hematuria.  No malodorous urine.  Patient did get a gram of Tylenol without improvement in his fever.  He does report the Tylenol helps with his osteoarthritic pain.  Patient has no further complaints.    PT Comments    Pt sitting in chair upon entrance with company present and willing to participate with therapy today.  Presents with supervision for transfer and min guard during gait training.  Able to increased distance with gait training and use of RW for balance/safety, pt with slow labored movements with no LOB episodes today.  EOS pt left in chair with company present.  No reports of pain through session, was limited by fatigue.   Recommendations for follow up therapy are one component of a multi-disciplinary discharge planning process, led by the attending physician.  Recommendations may be updated based on patient status, additional functional criteria and insurance authorization.  Follow Up Recommendations  Home health PT     Assistance Recommended at Discharge Set up Supervision/Assistance  Patient can return home with the following A little help with walking and/or transfers;A little help with bathing/dressing/bathroom;Help with  stairs or ramp for entrance;Assistance with cooking/housework   Equipment Recommendations  None recommended by PT    Recommendations for Other Services       Precautions / Restrictions Precautions Precautions: Fall Restrictions Weight Bearing Restrictions: No     Mobility  Bed Mobility Overal bed mobility: Independent             General bed mobility comments: Pt sitting in chair upon therapist's entrance    Transfers Overall transfer level: Needs assistance Equipment used: Rolling walker (2 wheels) Transfers: Sit to/from Stand Sit to Stand: Supervision           General transfer comment: slightly labored movement without loss of balance    Ambulation/Gait Ambulation/Gait assistance: Supervision, Min guard Gait Distance (Feet): 150 Feet Assistive device: Rolling walker (2 wheels) Gait Pattern/deviations: Decreased step length - right, Decreased step length - left, Decreased stride length, Trunk flexed Gait velocity: decreased     General Gait Details: slightly labored cadence without loss of balance, limited mostly due to c/o fatigue   Stairs             Wheelchair Mobility    Modified Rankin (Stroke Patients Only)       Balance                                            Cognition Arousal/Alertness: Awake/alert Behavior During Therapy: WFL for tasks assessed/performed Overall Cognitive Status: Within Functional Limits for tasks  assessed                                          Exercises      General Comments        Pertinent Vitals/Pain Pain Assessment Pain Assessment: No/denies pain    Home Living                          Prior Function            PT Goals (current goals can now be found in the care plan section)      Frequency    Min 3X/week      PT Plan Current plan remains appropriate    Co-evaluation              AM-PAC PT "6 Clicks" Mobility    Outcome Measure  Help needed turning from your back to your side while in a flat bed without using bedrails?: None Help needed moving from lying on your back to sitting on the side of a flat bed without using bedrails?: None Help needed moving to and from a bed to a chair (including a wheelchair)?: A Little Help needed standing up from a chair using your arms (e.g., wheelchair or bedside chair)?: A Little Help needed to walk in hospital room?: A Little Help needed climbing 3-5 steps with a railing? : A Little 6 Click Score: 20    End of Session Equipment Utilized During Treatment: Gait belt Activity Tolerance: Patient tolerated treatment well;Patient limited by fatigue Patient left: in chair;with family/visitor present;with call bell/phone within reach Nurse Communication: Mobility status PT Visit Diagnosis: Unsteadiness on feet (R26.81);Other abnormalities of gait and mobility (R26.89);Muscle weakness (generalized) (M62.81)     Time: 7494-4967 PT Time Calculation (min) (ACUTE ONLY): 13 min  Charges:  $Therapeutic Exercise: 8-22 mins                     Ihor Austin, LPTA/CLT; CBIS [phone]  Aldona Lento 04/29/2022, 11:27 AM

## 2022-04-29 NOTE — TOC Transition Note (Signed)
Transition of Care [facility]) - CM/SW Discharge Note   Patient Details  Name: Gregory Pearson MRN: [id number] Date of Birth: 24-Nov-1927  Transition of Care [facility]) CM/SW Contact:  Salome Arnt, LCSW Phone Number: 04/29/2022, 10:42 AM   Clinical Narrative:  Pt d/c today. Linda with [facility] notified of d/c. Home health orders in.       Final next level of care: [place] Barriers to Discharge: Barriers Resolved   Patient Goals and CMS Choice Patient states their goals for this hospitalization and ongoing recovery are:: return home   Choice offered to / list presented to : Patient  Discharge Placement                    Patient and family notified of of transfer: 04/29/22  Discharge Plan and Services                          HH Arranged: PT [facility] Agency: [place] ([place]) Date [place]: 04/29/22 Time [place]: 0807 Representative spoke with at [place]: [place] Determinants of Health ([place]) Interventions     Readmission Risk Interventions     No data to display

## 2022-04-30 ENCOUNTER — Telehealth: Payer: Self-pay | Admitting: Family Medicine

## 2022-04-30 NOTE — Telephone Encounter (Signed)
Patient was admitted to [facility] Friday morning 9/1-9/4. Needs hospital follow up. Please call patient back to schedule.

## 2022-04-30 NOTE — Telephone Encounter (Signed)
Called to schedule hospital follow up. Patient declined

## 2022-05-02 NOTE — Telephone Encounter (Signed)
Hospital follow up Dr. Warrick Parisian Sept 15th 8:55 am

## 2022-05-02 NOTE — Telephone Encounter (Signed)
Pt rc for hospital follow up.

## 2022-05-10 ENCOUNTER — Ambulatory Visit (INDEPENDENT_AMBULATORY_CARE_PROVIDER_SITE_OTHER): Payer: Medicare Other | Admitting: Family Medicine

## 2022-05-10 ENCOUNTER — Encounter: Payer: Self-pay | Admitting: Family Medicine

## 2022-05-10 VITALS — BP 132/78 | HR 93 | Temp 98.2°F | Ht 72.0 in | Wt 213.0 lb

## 2022-05-10 DIAGNOSIS — Z9079 Acquired absence of other genital organ(s): Secondary | ICD-10-CM | POA: Diagnosis not present

## 2022-05-10 DIAGNOSIS — A419 Sepsis, unspecified organism: Secondary | ICD-10-CM

## 2022-05-10 DIAGNOSIS — A4150 Gram-negative sepsis, unspecified: Secondary | ICD-10-CM

## 2022-05-10 DIAGNOSIS — N3001 Acute cystitis with hematuria: Secondary | ICD-10-CM

## 2022-05-10 DIAGNOSIS — N390 Urinary tract infection, site not specified: Secondary | ICD-10-CM | POA: Diagnosis not present

## 2022-05-10 DIAGNOSIS — F5104 Psychophysiologic insomnia: Secondary | ICD-10-CM | POA: Diagnosis not present

## 2022-05-10 LAB — CMP14+EGFR
ALT: 19 IU/L (ref 0–44)
AST: 18 IU/L (ref 0–40)
Albumin/Globulin Ratio: 1.7 (ref 1.2–2.2)
Albumin: 4.3 g/dL (ref 3.6–4.6)
Alkaline Phosphatase: 104 IU/L (ref 44–121)
BUN/Creatinine Ratio: 15 (ref 10–24)
BUN: 14 mg/dL (ref 10–36)
Bilirubin Total: 0.5 mg/dL (ref 0.0–1.2)
CO2: 23 mmol/L (ref 20–29)
Calcium: 9.2 mg/dL (ref 8.6–10.2)
Chloride: 101 mmol/L (ref 96–106)
Creatinine, Ser: 0.91 mg/dL (ref 0.76–1.27)
Globulin, Total: 2.5 g/dL (ref 1.5–4.5)
Glucose: 104 mg/dL — ABNORMAL HIGH (ref 70–99)
Potassium: 4.7 mmol/L (ref 3.5–5.2)
Sodium: 139 mmol/L (ref 134–144)
Total Protein: 6.8 g/dL (ref 6.0–8.5)
eGFR: 79 mL/min/{1.73_m2} (ref >59)

## 2022-05-10 LAB — CBC WITH DIFFERENTIAL/PLATELET
Basophils Absolute: 0.1 10*3/uL (ref 0.0–0.2)
Basos: 1 %
EOS (ABSOLUTE): 0 10*3/uL (ref 0.0–0.4)
Eos: 1 %
Hematocrit: 44.9 % (ref 37.5–51.0)
Hemoglobin: 15 g/dL (ref 13.0–17.7)
Immature Grans (Abs): 0 10*3/uL (ref 0.0–0.1)
Immature Granulocytes: 0 %
Lymphocytes Absolute: 1.1 10*3/uL (ref 0.7–3.1)
Lymphs: 17 %
MCH: 31.8 pg (ref 26.6–33.0)
MCHC: 33.4 g/dL (ref 31.5–35.7)
MCV: 95 fL (ref 79–97)
Monocytes Absolute: 0.6 10*3/uL (ref 0.1–0.9)
Monocytes: 9 %
Neutrophils Absolute: 4.7 10*3/uL (ref 1.4–7.0)
Neutrophils: 72 %
Platelets: 283 10*3/uL (ref 150–450)
RBC: 4.71 x10E6/uL (ref 4.14–5.80)
RDW: 11.5 % — ABNORMAL LOW (ref 11.6–15.4)
WBC: 6.6 10*3/uL (ref 3.4–10.8)

## 2022-05-10 LAB — URINALYSIS
Bilirubin, UA: NEGATIVE
Glucose, UA: NEGATIVE
Leukocytes,UA: NEGATIVE
Nitrite, UA: NEGATIVE
Protein,UA: NEGATIVE
Specific Gravity, UA: 1.015 (ref 1.005–1.030)
Urobilinogen, Ur: 0.2 mg/dL (ref 0.2–1.0)
pH, UA: 7 (ref 5.0–7.5)

## 2022-05-10 MED ORDER — TRAZODONE HCL 50 MG PO TABS: 25.0000 mg | ORAL_TABLET | Freq: Every evening | ORAL | 5 refills | Status: DC | PRN

## 2022-05-10 NOTE — Progress Notes (Signed)
BP 132/78   Pulse 93   Temp 98.2 F (36.8 C)   Ht 6' (1.829 m)   Wt 213 lb (96.6 kg)   SpO2 98%   BMI 28.89 kg/m    Subjective:   Patient ID: Gregory Pearson, male    DOB: May 08, 1928, 88 y.o.   MRN: [id number]  HPI: Gregory Pearson is a 88 y.o. male presenting on 05/10/2022 for No chief complaint on file.   HPI Patient is coming in for hospital follow-up for UTI and sepsis and E. coli.  He was admitted on 04/26/2022 and discharged on 04/29/2022.  Since leaving the hospital his daughter has more moved in and become more available to help with his care.  He says he still having a lot of urinary frequency and has a history of prostatectomy due to prostate cancer.  He was in the hospital this time with E. coli in the urine and then sepsis secondary to it.  He says he is feeling a lot better and has a lot of energy.  Patient also coming in today with his daughter admitting to having a lot of stress and anxiety preventing him from sleep especially at night.  He gets worrying and then he either wakes up or cannot fall asleep because of the worries and stress especially since leaving the hospital.  He says during the day he does okay but is mainly at night.  Relevant past medical, surgical, family and social history reviewed and updated as indicated. Interim medical history since our last visit reviewed. Allergies and medications reviewed and updated.  Review of Systems  Constitutional:  Negative for chills and fever.  Eyes:  Negative for visual disturbance.  Respiratory:  Negative for shortness of breath and wheezing.   Cardiovascular:  Negative for chest pain and leg swelling.  Genitourinary:  Positive for frequency and urgency. Negative for dysuria.  Musculoskeletal:  Negative for back pain and gait problem.  Skin:  Negative for rash.  Neurological:  Negative for dizziness, weakness and light-headedness.  Psychiatric/Behavioral:  Positive for sleep disturbance. Negative for self-injury and  suicidal ideas. The patient is nervous/anxious.   All other systems reviewed and are negative.   Per HPI unless specifically indicated above   Allergies as of 05/10/2022   No Known Allergies      Medication List        Accurate as of May 10, 2022  9:41 AM. If you have any questions, ask your nurse or doctor.          acetaminophen 500 MG tablet Commonly known as: TYLENOL Take 1 tablet (500 mg total) by mouth every 6 (six) hours as needed. What changed: reasons to take this   multivitamin tablet Take 1 tablet by mouth daily.   nabumetone 500 MG tablet Commonly known as: RELAFEN Take 1 tablet (500 mg total) by mouth 2 (two) times daily as needed for mild pain.   pantoprazole 40 MG tablet Commonly known as: PROTONIX Take 1 tablet (40 mg total) by mouth daily.   saccharomyces boulardii 250 MG capsule Commonly known as: Florastor Take 1 capsule (250 mg total) by mouth 2 (two) times daily for 14 days.   traZODone 50 MG tablet Commonly known as: DESYREL Take 0.5-1 tablets (25-50 mg total) by mouth at bedtime as needed for sleep. Started by: Fransisca Kaufmann Wyatt Thorstenson, MD         Objective:   BP 132/78   Pulse 93   Temp 98.2 F (36.8 C)  Ht 6' (1.829 m)   Wt 213 lb (96.6 kg)   SpO2 98%   BMI 28.89 kg/m   Wt Readings from Last 3 Encounters:  05/10/22 213 lb (96.6 kg)  04/27/22 201 lb 1 oz (91.2 kg)  01/10/22 218 lb (98.9 kg)    Physical Exam Vitals and nursing note reviewed.  Constitutional:      General: He is not in acute distress.    Appearance: He is well-developed. He is not diaphoretic.  Eyes:     General: No scleral icterus.    Conjunctiva/sclera: Conjunctivae normal.  Neck:     Thyroid: No thyromegaly.  Cardiovascular:     Rate and Rhythm: Normal rate and regular rhythm.     Heart sounds: Normal heart sounds. No murmur heard. Pulmonary:     Effort: Pulmonary effort is normal. No respiratory distress.     Breath sounds: Normal breath  sounds. No wheezing.  Musculoskeletal:        General: No swelling. Normal range of motion.     Cervical back: Neck supple.  Lymphadenopathy:     Cervical: No cervical adenopathy.  Skin:    General: Skin is warm and dry.     Findings: No rash.  Neurological:     Mental Status: He is alert and oriented to person, place, and time.     Coordination: Coordination normal.  Psychiatric:        Behavior: Behavior normal.    Assessment & Plan:   Problem List Items Addressed This Visit       Genitourinary   E Coli UTI (urinary tract infection) - Primary   Relevant Orders   Urine Culture   Urinalysis   CBC with Differential/Platelet   CMP14+EGFR   Ambulatory referral to Urology     Other   History of prostatectomy   Sepsis secondary to UTI [facility])   Relevant Orders   Ambulatory referral to Urology   Sepsis due to E Coli   Relevant Orders   Ambulatory referral to Urology   Other Visit Diagnoses     Psychophysiological insomnia       Relevant Medications   traZODone (DESYREL) 50 MG tablet       We will check urine, will start trazodone to help with sleep and anxiety.  We will also do referral to urology. Follow up plan: Return if symptoms worsen or fail to improve, for 2 to 61-monthrecheck and blood work.  Counseling provided for all of the vaccine components Orders Placed This Encounter  Procedures   Urine Culture   Urinalysis   CBC with Differential/Platelet   CMP14+EGFR   Ambulatory referral to Urology    JCaryl Pina MD [facility] 05/10/2022, 9:41 AM

## 2022-05-13 LAB — URINE CULTURE

## 2022-05-14 ENCOUNTER — Encounter: Payer: Self-pay | Admitting: Family Medicine

## 2022-05-14 DIAGNOSIS — G8929 Other chronic pain: Secondary | ICD-10-CM

## 2022-05-27 DIAGNOSIS — Z23 Encounter for immunization: Secondary | ICD-10-CM | POA: Diagnosis not present

## 2022-05-29 DIAGNOSIS — R233 Spontaneous ecchymoses: Secondary | ICD-10-CM | POA: Diagnosis not present

## 2022-05-29 DIAGNOSIS — D492 Neoplasm of unspecified behavior of bone, soft tissue, and skin: Secondary | ICD-10-CM | POA: Diagnosis not present

## 2022-05-29 DIAGNOSIS — L821 Other seborrheic keratosis: Secondary | ICD-10-CM | POA: Diagnosis not present

## 2022-06-03 ENCOUNTER — Ambulatory Visit (INDEPENDENT_AMBULATORY_CARE_PROVIDER_SITE_OTHER): Payer: Medicare Other | Admitting: Podiatry

## 2022-06-03 ENCOUNTER — Encounter: Payer: Self-pay | Admitting: Family Medicine

## 2022-06-03 DIAGNOSIS — M2142 Flat foot [pes planus] (acquired), left foot: Secondary | ICD-10-CM | POA: Diagnosis not present

## 2022-06-03 DIAGNOSIS — M2141 Flat foot [pes planus] (acquired), right foot: Secondary | ICD-10-CM | POA: Diagnosis not present

## 2022-06-03 NOTE — Progress Notes (Signed)
  Subjective:  Patient ID: Gregory Pearson, male    DOB: [birth year],  MRN: [id number]  Chief Complaint  Patient presents with   Foot Orthotics    NP - interested in orthotics for lower back pain    88 y.o. male presents with the above complaint. History confirmed with patient.  He has worn custom and orthoses for the last 15 years that he got from our practice, these have done very well but they are losing support and beginning to fall apart.  He is helped his back quite a lot  Objective:  Physical Exam: warm, good capillary refill, no trophic changes or ulcerative lesions, normal DP and PT pulses, normal sensory exam, and pes planus deformity noted.  Assessment:   1. Pes planus of both feet      Plan:  Patient was evaluated and treated and all questions answered.  We discussed further treatment of his pes planus and alignment issues.  Orthotic support has been beneficial for him.  He is interested in proceeding with new custom molded orthoses today.  He was casted for these.  I discussed with him they will not be complete identical to the current.  He has about we will make them as close as possible.  No follow-ups on file.

## 2022-06-05 DIAGNOSIS — Z23 Encounter for immunization: Secondary | ICD-10-CM | POA: Diagnosis not present

## 2022-06-24 DIAGNOSIS — M1711 Unilateral primary osteoarthritis, right knee: Secondary | ICD-10-CM | POA: Diagnosis not present

## 2022-06-24 DIAGNOSIS — M170 Bilateral primary osteoarthritis of knee: Secondary | ICD-10-CM | POA: Diagnosis not present

## 2022-06-24 DIAGNOSIS — M1712 Unilateral primary osteoarthritis, left knee: Secondary | ICD-10-CM | POA: Diagnosis not present

## 2022-06-25 ENCOUNTER — Telehealth: Payer: Self-pay | Admitting: Podiatry

## 2022-06-25 ENCOUNTER — Ambulatory Visit
Admission: EM | Admit: 2022-06-25 | Discharge: 2022-06-25 | Disposition: A | Discharge status: Home / Self Care | Payer: Medicare Other | Attending: Nurse Practitioner | Admitting: Nurse Practitioner

## 2022-06-25 DIAGNOSIS — S51811A Laceration without foreign body of right forearm, initial encounter: Secondary | ICD-10-CM

## 2022-06-25 MED ORDER — MUPIROCIN 2 % EX OINT: 1.0000 | TOPICAL_OINTMENT | Freq: Two times a day (BID) | CUTANEOUS | 0 refills | Status: DC

## 2022-06-25 NOTE — Telephone Encounter (Signed)
lvm for patient to contact office to sched appointment  for opu

## 2022-06-25 NOTE — ED Provider Notes (Signed)
[facility]    CSN: [id number] Arrival date & time: 06/25/22  1356      History   Chief Complaint No chief complaint on file.   HPI Gregory Pearson is a 88 y.o. male.   The history is provided by the patient and a relative.   Patient presents for complaints of a skin tear to the right forearm.  Patient states that approximately a week ago, he was lifting an object that was not very heavy, but dropped it, object rub across the right forearm causing a skin tear.  He states that at that time he put an over-the-counter cream on it with a Band-Aid, but states that he did not change the Band-Aid.  His daughter states that she was concerned because the area has become more red.  She states that she did remove the bandage last evening and cleaned it with an antiseptic spray.  She states that since that time, she has also had him leave it open to air.  She reports that the area looks much improved at this time.  Patient denies fever, chills, chest pain, nausea, vomiting, foul-smelling drainage, or oozing from the site.  Past Medical History:  Diagnosis Date   Asbestos exposure    Blood transfusion without reported diagnosis    after ruptured appendix 1963   Diverticulosis    Gastric ulcer 06/2019   GERD (gastroesophageal reflux disease)    History of stomach ulcers 1995   H.pylori s/p treatment   History of stomach ulcers    Hyperlipidemia    Muscle strain of right gluteal region 01/24/2014   Prostate cancer ([place]) 2002   prostatectomy   Traumatic hematoma of buttock 01/24/2014    Patient Active Problem List   Diagnosis Date Noted   History of prostatectomy 05/10/2022   Sepsis due to E Coli 04/27/2022   E coli bacteremia 04/27/2022   E Coli UTI (urinary tract infection) 04/26/2022   Sepsis secondary to UTI ([place]) 04/26/2022   Hematuria 07/09/2021   Acute pain of right shoulder 07/09/2021   Bilateral knee pain 12/08/2019   Gastric ulcer 08/11/2019   History of gastric  ulcer 08/11/2019   History of GI bleed    Melena    Hyperglycemia 09/25/2016   Venous stasis 05/17/2016   Hyperlipidemia 07/11/2015   Thrombocytopenia ([place]) 01/24/2014    Past Surgical History:  Procedure Laterality Date   APPENDECTOMY  1963   ruptured appendix/gangrene   BIOPSY  07/06/2019   Procedure: BIOPSY;  Surgeon: Daneil Dolin, MD;  [facility];  Service: Endoscopy;;   COLONOSCOPY  2013   Dr. Fuller Plan: multiple colon polyps (tubular adenomas), diverticulosis. no further surveillance colonoscopies due to age.   ESOPHAGOGASTRODUODENOSCOPY  2831,5176   Dr. Fuller Plan: gastric ulcer (h.pylori + s/p tx), follow up EGD verified ulcer healing   ESOPHAGOGASTRODUODENOSCOPY N/A 09/26/2016   Procedure: ESOPHAGOGASTRODUODENOSCOPY (EGD);  Surgeon: Daneil Dolin, MD;  [facility];  Service: Endoscopy;  Laterality: N/A;   ESOPHAGOGASTRODUODENOSCOPY N/A 01/23/2017   Procedure: ESOPHAGOGASTRODUODENOSCOPY (EGD);  Surgeon: Daneil Dolin, MD;  [facility];  Service: Endoscopy;  Laterality: N/A;  12:15pm   ESOPHAGOGASTRODUODENOSCOPY N/A 07/06/2019   Procedure: ESOPHAGOGASTRODUODENOSCOPY (EGD);  Surgeon: Daneil Dolin, MD; normal esophagus, nonbleeding gastric ulcer with pigmented material, abnormal gastric mucosa of uncertain significance s/p biopsy, normal duodenum.  Pathology with mild reactive gastropathy, mild chronic gastritis, H. pylori negative.   ESOPHAGOGASTRODUODENOSCOPY N/A 10/20/2019   Procedure: ESOPHAGOGASTRODUODENOSCOPY (EGD);  Surgeon: Gala Romney,  Cristopher Estimable, MD;  [facility];  Service: Endoscopy;  Laterality: N/A;  9:30am   [place]  2003   PROSTATECTOMY  2002       Home Medications    Prior to Admission medications   Medication Sig Start Date End Date Taking? Authorizing Provider  mupirocin ointment (BACTROBAN) 2 % Apply 1 Application topically 2 (two) times daily. 06/25/22  Yes  Anyra Kaufman-Warren, Alda Lea, NP  acetaminophen (TYLENOL) 500 MG tablet Take 1 tablet (500 mg total) by mouth every 6 (six) hours as needed. Patient taking differently: Take 500 mg by mouth every 6 (six) hours as needed for fever or mild pain. 11/14/21   Ivy Lynn, NP  Multiple Vitamin (MULTIVITAMIN) tablet Take 1 tablet by mouth daily.    [provider]  nabumetone (RELAFEN) 500 MG tablet Take 1 tablet (500 mg total) by mouth 2 (two) times daily as needed for mild pain. 04/29/22   Johnson, Clanford L, MD  pantoprazole (PROTONIX) 40 MG tablet Take 1 tablet (40 mg total) by mouth daily. 04/30/22   Johnson, Clanford L, MD  traZODone (DESYREL) 50 MG tablet Take 0.5-1 tablets (25-50 mg total) by mouth at bedtime as needed for sleep. 05/10/22   Dettinger, Fransisca Kaufmann, MD    Family History Family History  Problem Relation Age of Onset   Prostate cancer Brother    Prostate cancer Brother    Suicidality Brother    Prostate cancer Brother    Stomach cancer Neg Hx    Colon cancer Neg Hx     Social History Social History   Tobacco Use   Smoking status: Never   Smokeless tobacco: Never  Vaping Use   Vaping Use: Never used  Substance Use Topics   Alcohol use: Not Currently    Comment: last drank about 15 years ago   Drug use: Never     Allergies   Patient has no known allergies.   Review of Systems Review of Systems Per HPI  Physical Exam Triage Vital Signs ED Triage Vitals  Enc Vitals Group     BP 06/25/22 1409 127/83     Pulse Rate 06/25/22 1409 71     Resp 06/25/22 1409 20     Temp 06/25/22 1409 97.9 F (36.6 C)     Temp Source 06/25/22 1409 Oral     SpO2 06/25/22 1409 95 %     Weight --      Height --      Head Circumference --      Peak Flow --      Pain Score 06/25/22 1413 0     Pain Loc --      Pain Edu? --      Excl. in [place]? --    No data found.  Updated Vital Signs BP 127/83 (BP Location: Right Arm)   Pulse 71   Temp 97.9 F (36.6 C) (Oral)   Resp  20   SpO2 95%   Visual Acuity Right Eye Distance:   Left Eye Distance:   Bilateral Distance:    Right Eye Near:   Left Eye Near:    Bilateral Near:     Physical Exam Vitals and nursing note reviewed.  Constitutional:      General: He is not in acute distress.    Appearance: Normal appearance.  Eyes:     Extraocular Movements: Extraocular movements intact.     Conjunctiva/sclera: Conjunctivae normal.  Pupils: Pupils are equal, round, and reactive to light.  Pulmonary:     Effort: Pulmonary effort is normal.  Skin:    General: Skin is warm and dry.     Comments: Skin tear present to the ventral surface of the right forearm area measures approximately 3 cm x 4 cm.  Area is in multiple stages of healing, with an erythematous base.  There is no oozing, drainage, or swelling present.  Neurological:     General: No focal deficit present.     Mental Status: He is alert and oriented to person, place, and time.  Psychiatric:        Mood and Affect: Mood normal.        Behavior: Behavior normal.      UC Treatments / Results  Labs (all labs ordered are listed, but only abnormal results are displayed) Labs Reviewed - No data to display  EKG   Radiology No results found.  Procedures Procedures (including critical care time)  Medications Ordered in UC Medications - No data to display  Initial Impression / Assessment and Plan / UC Course  I have reviewed the triage vital signs and the nursing notes.  Pertinent labs & imaging results that were available during my care of the patient were reviewed by me and considered in my medical decision making (see chart for details).  Healing skin tear noted to the ventral aspect of the right forearm.  Patient is well-appearing, he is in no acute distress.  No obvious signs of systemic infection or localized infection. Will treat patient with mupirocin 2% ointment for the skin tear.  Discussion with patient and his daughter regarding  wound care instructions at home.  Dressing was applied to the area prior to discharge today.  Discussed with patient's daughter strict indications of when to go to the emergency department.  Patient and daughter verbalized understanding.  All questions were answered.  Patient is stable for discharge. Final Clinical Impressions(s) / UC Diagnoses   Final diagnoses:  Skin tear of right forearm without complication, initial encounter     Discharge Instructions      Keep this bandage in place until tomorrow morning.  As discussed, when you are at home, apply antibiotic ointment, leave the area open to air to promote healing.  When you are going out, wrapped the area with a clean gauze, and bandage to prevent infection. Clean the area with warm water daily. Monitor the area for possible infection.  If there is increased redness,foul-smelling, swelling, or if he develops fever, chills, chest pain or abdominal pain, please follow-up in the emergency department immediately. Follow-up with PCP as needed. Follow-up as needed.      ED Prescriptions     Medication Sig Dispense Auth. Provider   mupirocin ointment (BACTROBAN) 2 % Apply 1 Application topically 2 (two) times daily. 22 g Kyilee Gregg-Warren, Alda Lea, NP      PDMP not reviewed this encounter.   Tish Men, NP 06/25/22 1523

## 2022-06-25 NOTE — ED Triage Notes (Signed)
Pt reports a week ago lifting something light but dropped it and tried to catch it but it scraped his skin.  He has a skin tare above his wrist. He put cream on it and a Band-Aid but when he took it off it had "bacteria" on it did not change it consistently.

## 2022-06-25 NOTE — ED Notes (Signed)
Site cleaned with normal saline. Dried site. Bacitracin ointment applied. Non-adherent pad placed on top. Secured with coban. Pt tolerated well.  Site management and infection education provided. Pt and pt family verbalized understanding.

## 2022-06-25 NOTE — Discharge Instructions (Signed)
Keep this bandage in place until tomorrow morning.  As discussed, when you are at home, apply antibiotic ointment, leave the area open to air to promote healing.  When you are going out, wrapped the area with a clean gauze, and bandage to prevent infection. Clean the area with warm water daily. Monitor the area for possible infection.  If there is increased redness,foul-smelling, swelling, or if he develops fever, chills, chest pain or abdominal pain, please follow-up in the emergency department immediately. Follow-up with PCP as needed. Follow-up as needed.

## 2022-06-26 ENCOUNTER — Ambulatory Visit (INDEPENDENT_AMBULATORY_CARE_PROVIDER_SITE_OTHER): Payer: Medicare Other

## 2022-06-26 ENCOUNTER — Ambulatory Visit: Payer: Medicare Other | Admitting: Family Medicine

## 2022-06-26 DIAGNOSIS — M2141 Flat foot [pes planus] (acquired), right foot: Secondary | ICD-10-CM

## 2022-06-26 DIAGNOSIS — M2142 Flat foot [pes planus] (acquired), left foot: Secondary | ICD-10-CM

## 2022-06-26 NOTE — Progress Notes (Signed)
Patient presents today to pick up custom molded foot orthotics, diagnosed with pes planus by Dr. Sherryle Lis.   Orthotics were dispensed and fit was satisfactory. Reviewed instructions for break-in and wear. Written instructions given to patient.  Patient will follow up as needed.   [facility] - order # [id number]

## 2022-07-01 DIAGNOSIS — M170 Bilateral primary osteoarthritis of knee: Secondary | ICD-10-CM | POA: Diagnosis not present

## 2022-07-08 DIAGNOSIS — M170 Bilateral primary osteoarthritis of knee: Secondary | ICD-10-CM | POA: Diagnosis not present

## 2022-07-26 ENCOUNTER — Ambulatory Visit (INDEPENDENT_AMBULATORY_CARE_PROVIDER_SITE_OTHER): Payer: Medicare Other

## 2022-07-26 VITALS — Ht 72.0 in | Wt 220.0 lb

## 2022-07-26 DIAGNOSIS — Z0000 Encounter for general adult medical examination without abnormal findings: Secondary | ICD-10-CM

## 2022-07-26 NOTE — Progress Notes (Signed)
Subjective:   Gregory Pearson is a 88 y.o. male who presents for Medicare Annual/Subsequent preventive examination. I connected with  [name] on 07/26/22 by a audio enabled telemedicine application and verified that I am speaking with the correct person using two identifiers.  Patient Location: Home  Provider Location: Home Office  I discussed the limitations of evaluation and management by telemedicine. The patient expressed understanding and agreed to proceed.  Review of Systems     Cardiac Risk Factors include: advanced age (>28mn, >>66women);male gender     Objective:    Today's Vitals   07/26/22 1313  Weight: 220 lb (99.8 kg)  Height: 6' (1.829 m)   Body mass index is 29.84 kg/m.     07/26/2022    1:17 PM 04/26/2022    4:21 AM 06/25/2021    2:14 PM 12/13/2019   10:23 AM 10/20/2019    8:36 AM 07/06/2019    4:13 PM 07/06/2019   11:00 AM  Advanced Directives  Does Patient Have a Medical Advance Directive? Yes Yes Yes No Yes Yes Yes  Type of [place]aramed[place]of [place] will [place] will [place] will  [place] will Living will Living will  Does patient want to make changes to medical advance directive?  No - Patient declined    No - Patient declined No - Patient declined  Copy of [place] Chart? No - copy requested No - copy requested No - copy requested        Current Medications (verified) Outpatient Encounter Medications as of 07/26/2022  Medication Sig   acetaminophen (TYLENOL) 500 MG tablet Take 1 tablet (500 mg total) by mouth every 6 (six) hours as needed. (Patient taking differently: Take 500 mg by mouth every 6 (six) hours as needed for fever or mild pain.)   Multiple Vitamin (MULTIVITAMIN) tablet Take 1 tablet by mouth daily.   mupirocin ointment (BACTROBAN) 2 % Apply 1 Application topically 2 (two) times daily.   nabumetone  (RELAFEN) 500 MG tablet Take 1 tablet (500 mg total) by mouth 2 (two) times daily as needed for mild pain.   pantoprazole (PROTONIX) 40 MG tablet Take 1 tablet (40 mg total) by mouth daily.   traZODone (DESYREL) 50 MG tablet Take 0.5-1 tablets (25-50 mg total) by mouth at bedtime as needed for sleep.   No facility-administered encounter medications on file as of 07/26/2022.    Allergies (verified) Patient has no known allergies.   History: Past Medical History:  Diagnosis Date   Asbestos exposure    Blood transfusion without reported diagnosis    after ruptured appendix 1963   Diverticulosis    Gastric ulcer 06/2019   GERD (gastroesophageal reflux disease)    History of stomach ulcers 1995   H.pylori s/p treatment   History of stomach ulcers    Hyperlipidemia    Muscle strain of right gluteal region 01/24/2014   [place]   prostatectomy   Traumatic hematoma of buttock 01/24/2014   Past Surgical History:  Procedure Laterality Date   APPENDECTOMY  1963   ruptured appendix/gangrene   BIOPSY  07/06/2019   Procedure: BIOPSY;  Surgeon: RDaneil Dolin MD;  [facility];  Service: Endoscopy;;   COLONOSCOPY  2013   Dr. SFuller Plan multiple colon polyps (tubular adenomas), diverticulosis. no further surveillance colonoscopies due to age.   ESOPHAGOGASTRODUODENOSCOPY  [id number]  Dr. SFuller Plan gastric ulcer (h.pylori +  s/p tx), follow up EGD verified ulcer healing   ESOPHAGOGASTRODUODENOSCOPY N/A 09/26/2016   Procedure: ESOPHAGOGASTRODUODENOSCOPY (EGD);  Surgeon: Daneil Dolin, MD;  [facility];  Service: Endoscopy;  Laterality: N/A;   ESOPHAGOGASTRODUODENOSCOPY N/A 01/23/2017   Procedure: ESOPHAGOGASTRODUODENOSCOPY (EGD);  Surgeon: Daneil Dolin, MD;  [facility];  Service: Endoscopy;  Laterality: N/A;  12:15pm   ESOPHAGOGASTRODUODENOSCOPY N/A 07/06/2019   Procedure: ESOPHAGOGASTRODUODENOSCOPY (EGD);  Surgeon: Daneil Dolin, MD; normal  esophagus, nonbleeding gastric ulcer with pigmented material, abnormal gastric mucosa of uncertain significance s/p biopsy, normal duodenum.  Pathology with mild reactive gastropathy, mild chronic gastritis, H. pylori negative.   ESOPHAGOGASTRODUODENOSCOPY N/A 10/20/2019   Procedure: ESOPHAGOGASTRODUODENOSCOPY (EGD);  Surgeon: Daneil Dolin, MD;  [facility];  Service: Endoscopy;  Laterality: N/A;  9:30am   [place]   LAPAROSCOPIC CHOLECYSTECTOMY  2003   PROSTATECTOMY  2002   Family History  Problem Relation Age of Onset   Prostate cancer Brother    Prostate cancer Brother    Suicidality Brother    Prostate cancer Brother    Stomach cancer Neg Hx    Colon cancer Neg Hx    Social History   Socioeconomic History   Marital status: Widowed    Spouse name: Not on file   Number of children: 3   Years of education: Not on file   Highest education level: Not on file  Occupational History   Occupation: retired Conservation officer, historic buildings power  Tobacco Use   Smoking status: Never   Smokeless tobacco: Never  Vaping Use   Vaping Use: Never used  Substance and Sexual Activity   Alcohol use: Not Currently    Comment: last drank about 15 years ago   Drug use: Never   Sexual activity: Not on file  Other Topics Concern   Not on file  Social History Narrative   3 children.   Widowed since 01/16/2021. Married x 61 years.    Social Determinants of Health   Financial Resource Strain: Low Risk  (07/26/2022)   Overall Financial Resource Strain (CARDIA)    Difficulty of Paying Living Expenses: Not hard at all  Food Insecurity: No Food Insecurity (07/26/2022)   Hunger Vital Sign    Worried About Running Out of Food in the Last Year: Never true    Ran Out of Food in the Last Year: Never true  Transportation Needs: No Transportation Needs (07/26/2022)   PRAPARE - Hydrologist (Medical): No    Lack of Transportation (Non-Medical): No  Physical Activity:  Insufficiently Active (07/26/2022)   Exercise Vital Sign    Days of Exercise per Week: 3 days    Minutes of Exercise per Session: 30 min  Stress: No Stress Concern Present (07/26/2022)   [place]    Feeling of Stress : Not at all  Social Connections: Moderately Integrated (07/26/2022)   Social Connection and Isolation Panel [NHANES]    Frequency of Communication with Friends and Family: More than three times a week    Frequency of Social Gatherings with Friends and Family: More than three times a week    Attends Religious Services: More than 4 times per year    Active Member of [employer] or Organizations: Yes    Attends Archivist Meetings: More than 4 times per year    Marital Status: Widowed    Tobacco Counseling Counseling given: Not Answered  Clinical Intake:  Pre-visit preparation completed: Yes  Pain : No/denies pain     Nutritional Risks: None Diabetes: No  How often do you need to have someone help you when you read instructions, pamphlets, or other written materials from your doctor or pharmacy?: 1 - Never  Diabetic?no   Interpreter Needed?: No  Information entered by :: Jadene Pierini, LPN   Activities of Daily Living    07/26/2022    1:17 PM 04/27/2022    7:36 PM  In your present state of health, do you have any difficulty performing the following activities:  Hearing? 0 1  Vision? 0 0  Difficulty concentrating or making decisions? 0 0  Walking or climbing stairs? 0 0  Dressing or bathing? 0 0  Doing errands, shopping? 0 1  Preparing Food and eating ? N   Using the Toilet? N   In the past six months, have you accidently leaked urine? N   Do you have problems with loss of bowel control? N   Managing your Medications? N   Managing your Finances? N   Housekeeping or managing your Housekeeping? N     Patient Care Team: Dettinger, Fransisca Kaufmann, MD as PCP - General (Family  Medicine)  Indicate any recent Medical Services you may have received from other than [facility] providers in the past year (date may be approximate).     Assessment:   This is a routine wellness examination for [place].  Hearing/Vision screen Vision Screening - Comments:: Wears rx glasses - up to date with routine eye exams with  Dr.Johnson   Dietary issues and exercise activities discussed: Current Exercise Habits: Home exercise routine, Type of exercise: walking, Time (Minutes): 30, Frequency (Times/Week): 3, Weekly Exercise (Minutes/Week): 90, Intensity: Mild, Exercise limited by: None identified   Goals Addressed             This Visit's Progress    Exercise 3x per week (30 min per time)   On track      Depression Screen    07/26/2022    1:16 PM 05/10/2022    9:53 AM 01/10/2022   11:21 AM 11/19/2021    2:36 PM 11/14/2021   12:35 PM 09/04/2021    7:56 AM 07/09/2021   12:39 PM  PHQ 2/9 Scores  PHQ - 2 Score 0 1 0 0 0 0 0  PHQ- 9 Score  4 2  0      Fall Risk    07/26/2022    1:14 PM 05/10/2022    9:53 AM 01/10/2022   11:21 AM 11/19/2021    2:36 PM 11/14/2021   12:35 PM  Fall Risk   Falls in the past year? 0 1 0 0 0  Number falls in past yr: 0 0     Injury with Fall? 0 1     Risk for fall due to : No Fall Risks Impaired balance/gait     Follow up Falls prevention discussed Falls evaluation completed       FALL RISK PREVENTION PERTAINING TO THE HOME:  Any stairs in or around the home? No  If so, are there any without handrails? No  Home free of loose throw rugs in walkways, pet beds, electrical cords, etc? Yes  Adequate lighting in your home to reduce risk of falls? Yes   ASSISTIVE DEVICES UTILIZED TO PREVENT FALLS:  Life alert? No  Use of a cane, walker or w/c? No  Grab bars in the bathroom? Yes  Shower chair  or bench in shower? Yes  Elevated toilet seat or a handicapped toilet? Yes       05/21/2018   11:49 AM 05/19/2017   11:02 AM 05/17/2016   10:48 AM  MMSE -  Mini Mental State Exam  Orientation to time '5 5 5  '$ Orientation to Place '5 5 5  '$ Registration '3 3 3  '$ Attention/ Calculation '3 2 2  '$ Recall '3 3 3  '$ Language- name 2 objects '2 2 2  '$ Language- repeat 1 0 1  Language- follow 3 step command '3 3 3  '$ Language- read & follow direction '1 1 1  '$ Write a sentence '1 1 1  '$ Copy design '1 1 1  '$ Total score '28 26 27        '$ 07/26/2022    1:17 PM 06/25/2021    2:20 PM  6CIT Screen  What Year? 0 points 0 points  What month? 0 points 0 points  What time? 0 points 0 points  Count back from 20 0 points 0 points  Months in reverse 0 points 4 points  Repeat phrase 0 points 0 points  Total Score 0 points 4 points    Immunizations Immunization History  Administered Date(s) Administered   Fluad Quad(high Dose 65+) 04/29/2019, 05/31/2020, 05/17/2021   Influenza, High Dose Seasonal PF 05/20/2016, 05/19/2017, 05/20/2018   Influenza,inj,Quad PF,6+ Mos 06/21/2013, 05/25/2014, 06/07/2015   PFIZER(Purple Top)SARS-COV-2 Vaccination 10/03/2019, 10/28/2019, 07/05/2020   Pneumococcal Conjugate-13 07/11/2015   Pneumococcal Polysaccharide-23 05/19/2017   Td 04/29/2019   Tdap 05/27/2007   Zoster Recombinat (Shingrix) 09/23/2017, 06/11/2018   Zoster, Live 12/17/2010    TDAP status: Up to date  Flu Vaccine status: Up to date  Pneumococcal vaccine status: Up to date  Covid-19 vaccine status: Completed vaccines  Qualifies for Shingles Vaccine? Yes   Zostavax completed Yes   Shingrix Completed?: Yes  Screening Tests Health Maintenance  Topic Date Due   COVID-19 Vaccine (4 - 2023-24 season) 04/26/2022   INFLUENZA VACCINE  11/24/2022 (Originally 03/26/2022)   Medicare Annual Wellness (AWV)  07/27/2023   DTaP/Tdap/Td (3 - Td or Tdap) 04/28/2029   Pneumonia Vaccine 47+ Years old  Completed   Zoster Vaccines- Shingrix  Completed   HPV VACCINES  Aged Out    Health Maintenance  Health Maintenance Due  Topic Date Due   COVID-19 Vaccine (4 - 2023-24 season)  04/26/2022    Colorectal cancer screening: Type of screening: Colonoscopy. Completed 09/11/2021. Repeat every 10 years  Lung Cancer Screening: (Low Dose CT Chest recommended if Age 26-80 years, 30 pack-year currently smoking OR have quit w/in 15years.) does not qualify.   Lung Cancer Screening Referral: n/a  Additional Screening:  Hepatitis C Screening: does not qualify;   Vision Screening: Recommended annual ophthalmology exams for early detection of glaucoma and other disorders of the eye. Is the patient up to date with their annual eye exam?  Yes  Who is the provider or what is the name of the office in which the patient attends annual eye exams? Dr.johnson  If pt is not established with a provider, would they like to be referred to a provider to establish care? No .   Dental Screening: Recommended annual dental exams for proper oral hygiene  Community Resource Referral / Chronic Care Management: CRR required this visit?  No   CCM required this visit?  No      Plan:     I have personally reviewed and noted the following in the patient's chart:   Medical  and social history Use of alcohol, tobacco or illicit drugs  Current medications and supplements including opioid prescriptions. Patient is not currently taking opioid prescriptions. Functional ability and status Nutritional status Physical activity Advanced directives List of other physicians Hospitalizations, surgeries, and ER visits in previous 12 months Vitals Screenings to include cognitive, depression, and falls Referrals and appointments  In addition, I have reviewed and discussed with patient certain preventive protocols, quality metrics, and best practice recommendations. A written personalized care plan for preventive services as well as general preventive health recommendations were provided to patient.     Daphane Shepherd, LPN   06/03/3234   Nurse Notes: none

## 2022-07-26 NOTE — Patient Instructions (Signed)
Gregory Pearson , Thank you for taking time to come for your Medicare Wellness Visit. I appreciate your ongoing commitment to your health goals. Please review the following plan we discussed and let me know if I can assist you in the future.   These are the goals we discussed:  Goals      Exercise 3x per week (30 min per time)        This is a list of the screening recommended for you and due dates:  Health Maintenance  Topic Date Due   COVID-19 Vaccine (4 - 2023-24 season) 04/26/2022   Flu Shot  11/24/2022*   Medicare Annual Wellness Visit  07/27/2023   DTaP/Tdap/Td vaccine (3 - Td or Tdap) 04/28/2029   Pneumonia Vaccine  Completed   Zoster (Shingles) Vaccine  Completed   HPV Vaccine  Aged Out  *Topic was postponed. The date shown is not the original due date.    Advanced directives: Please bring a copy of your health care power of attorney and living will to the office to be added to your chart at your convenience.   Conditions/risks identified: Aim for 30 minutes of exercise or brisk walking, 6-8 glasses of water, and 5 servings of fruits and vegetables each day.   Next appointment: Follow up in one year for your annual wellness visit.   Preventive Care 69 Years and Older, Male  Preventive care refers to lifestyle choices and visits with your health care provider that can promote health and wellness. What does preventive care include? A yearly physical exam. This is also called an annual well check. Dental exams once or twice a year. Routine eye exams. Ask your health care provider how often you should have your eyes checked. Personal lifestyle choices, including: Daily care of your teeth and gums. Regular physical activity. Eating a healthy diet. Avoiding tobacco and drug use. Limiting alcohol use. Practicing safe sex. Taking low doses of aspirin every day. Taking vitamin and mineral supplements as recommended by your health care provider. What happens during an annual  well check? The services and screenings done by your health care provider during your annual well check will depend on your age, overall health, lifestyle risk factors, and family history of disease. Counseling  Your health care provider may ask you questions about your: Alcohol use. Tobacco use. Drug use. Emotional well-being. Home and relationship well-being. Sexual activity. Eating habits. History of falls. Memory and ability to understand (cognition). Work and work Statistician. Screening  You may have the following tests or measurements: Height, weight, and BMI. Blood pressure. Lipid and cholesterol levels. These may be checked every 5 years, or more frequently if you are over 22 years old. Skin check. Lung cancer screening. You may have this screening every year starting at age 82 if you have a 30-pack-year history of smoking and currently smoke or have quit within the past 15 years. Fecal occult blood test (FOBT) of the stool. You may have this test every year starting at age 54. Flexible sigmoidoscopy or colonoscopy. You may have a sigmoidoscopy every 5 years or a colonoscopy every 10 years starting at age 28. Prostate cancer screening. Recommendations will vary depending on your family history and other risks. Hepatitis C blood test. Hepatitis B blood test. Sexually transmitted disease (STD) testing. Diabetes screening. This is done by checking your blood sugar (glucose) after you have not eaten for a while (fasting). You may have this done every 1-3 years. Abdominal aortic aneurysm (AAA) screening. You  may need this if you are a current or former smoker. Osteoporosis. You may be screened starting at age 57 if you are at high risk. Talk with your health care provider about your test results, treatment options, and if necessary, the need for more tests. Vaccines  Your health care provider may recommend certain vaccines, such as: Influenza vaccine. This is recommended every  year. Tetanus, diphtheria, and acellular pertussis (Tdap, Td) vaccine. You may need a Td booster every 10 years. Zoster vaccine. You may need this after age 30. Pneumococcal 13-valent conjugate (PCV13) vaccine. One dose is recommended after age 82. Pneumococcal polysaccharide (PPSV23) vaccine. One dose is recommended after age 42. Talk to your health care provider about which screenings and vaccines you need and how often you need them. This information is not intended to replace advice given to you by your health care provider. Make sure you discuss any questions you have with your health care provider. Document Released: 09/08/2015 Document Revised: 05/01/2016 Document Reviewed: 06/13/2015 Elsevier Interactive Patient Education  2017 [place] Prevention in the Home Falls can cause injuries. They can happen to people of all ages. There are many things you can do to make your home safe and to help prevent falls. What can I do on the outside of my home? Regularly fix the edges of walkways and driveways and fix any cracks. Remove anything that might make you trip as you walk through a door, such as a raised step or threshold. Trim any bushes or trees on the path to your home. Use bright outdoor lighting. Clear any walking paths of anything that might make someone trip, such as rocks or tools. Regularly check to see if handrails are loose or broken. Make sure that both sides of any steps have handrails. Any raised decks and porches should have guardrails on the edges. Have any leaves, snow, or ice cleared regularly. Use sand or salt on walking paths during winter. Clean up any spills in your garage right away. This includes oil or grease spills. What can I do in the bathroom? Use night lights. Install grab bars by the toilet and in the tub and shower. Do not use towel bars as grab bars. Use non-skid mats or decals in the tub or shower. If you need to sit down in the shower, use a  plastic, non-slip stool. Keep the floor dry. Clean up any water that spills on the floor as soon as it happens. Remove soap buildup in the tub or shower regularly. Attach bath mats securely with double-sided non-slip rug tape. Do not have throw rugs and other things on the floor that can make you trip. What can I do in the bedroom? Use night lights. Make sure that you have a light by your bed that is easy to reach. Do not use any sheets or blankets that are too big for your bed. They should not hang down onto the floor. Have a firm chair that has side arms. You can use this for support while you get dressed. Do not have throw rugs and other things on the floor that can make you trip. What can I do in the kitchen? Clean up any spills right away. Avoid walking on wet floors. Keep items that you use a lot in easy-to-reach places. If you need to reach something above you, use a strong step stool that has a grab bar. Keep electrical cords out of the way. Do not use floor polish or wax that  makes floors slippery. If you must use wax, use non-skid floor wax. Do not have throw rugs and other things on the floor that can make you trip. What can I do with my stairs? Do not leave any items on the stairs. Make sure that there are handrails on both sides of the stairs and use them. Fix handrails that are broken or loose. Make sure that handrails are as long as the stairways. Check any carpeting to make sure that it is firmly attached to the stairs. Fix any carpet that is loose or worn. Avoid having throw rugs at the top or bottom of the stairs. If you do have throw rugs, attach them to the floor with carpet tape. Make sure that you have a light switch at the top of the stairs and the bottom of the stairs. If you do not have them, ask someone to add them for you. What else can I do to help prevent falls? Wear shoes that: Do not have high heels. Have rubber bottoms. Are comfortable and fit you  well. Are closed at the toe. Do not wear sandals. If you use a stepladder: Make sure that it is fully opened. Do not climb a closed stepladder. Make sure that both sides of the stepladder are locked into place. Ask someone to hold it for you, if possible. Clearly mark and make sure that you can see: Any grab bars or handrails. First and last steps. Where the edge of each step is. Use tools that help you move around (mobility aids) if they are needed. These include: Canes. Walkers. Scooters. Crutches. Turn on the lights when you go into a dark area. Replace any light bulbs as soon as they burn out. Set up your furniture so you have a clear path. Avoid moving your furniture around. If any of your floors are uneven, fix them. If there are any pets around you, be aware of where they are. Review your medicines with your doctor. Some medicines can make you feel dizzy. This can increase your chance of falling. Ask your doctor what other things that you can do to help prevent falls. This information is not intended to replace advice given to you by your health care provider. Make sure you discuss any questions you have with your health care provider. Document Released: 06/08/2009 Document Revised: 01/18/2016 Document Reviewed: 09/16/2014 Elsevier Interactive Patient Education  2017 [employer].

## 2022-07-29 DIAGNOSIS — L821 Other seborrheic keratosis: Secondary | ICD-10-CM | POA: Diagnosis not present

## 2022-07-29 DIAGNOSIS — R233 Spontaneous ecchymoses: Secondary | ICD-10-CM | POA: Diagnosis not present

## 2022-08-01 ENCOUNTER — Encounter: Payer: Self-pay | Admitting: Family Medicine

## 2022-08-01 DIAGNOSIS — F5104 Psychophysiologic insomnia: Secondary | ICD-10-CM

## 2022-08-02 MED ORDER — TRAZODONE HCL 50 MG PO TABS: 25.0000 mg | ORAL_TABLET | Freq: Every evening | ORAL | 3 refills | Status: DC | PRN

## 2022-09-10 ENCOUNTER — Other Ambulatory Visit: Payer: Self-pay | Admitting: Family Medicine

## 2022-10-17 ENCOUNTER — Telehealth: Payer: Medicare Other

## 2022-10-17 ENCOUNTER — Encounter: Payer: Self-pay | Admitting: Family Medicine

## 2022-10-17 ENCOUNTER — Telehealth (INDEPENDENT_AMBULATORY_CARE_PROVIDER_SITE_OTHER): Payer: Medicare Other | Admitting: Family Medicine

## 2022-10-17 DIAGNOSIS — U071 COVID-19: Secondary | ICD-10-CM | POA: Diagnosis not present

## 2022-10-17 MED ORDER — MOLNUPIRAVIR EUA 200MG CAPSULE: 4.0000 | ORAL_CAPSULE | Freq: Two times a day (BID) | ORAL | 0 refills | Status: DC

## 2022-10-17 MED ORDER — MOLNUPIRAVIR EUA 200MG CAPSULE: 4.0000 | ORAL_CAPSULE | Freq: Two times a day (BID) | ORAL | 0 refills | Status: AC

## 2022-10-17 NOTE — Progress Notes (Signed)
Virtual Visit via telephone Note  I connected with Gregory Pearson on 10/17/22 at 1519 by telephone and verified that I am speaking with the correct person using two identifiers. Gregory Pearson is currently located at home and  Significant other  are currently with her during visit. The provider, Fransisca Kaufmann Vaishali Baise, MD is located in their office at time of visit.  Call ended at 1525  I discussed the limitations, risks, security and privacy concerns of performing an evaluation and management service by telephone and the availability of in person appointments. I also discussed with the patient that there may be a patient responsible charge related to this service. The patient expressed understanding and agreed to proceed.   History and Present Illness: Patient is having runny and cough and congestion and tested positive for covid.  He took some cough syrup and felt a little better.  He is up to date on covid shots. Symptoms started 2 days ago. He denies SOB or wheezing. He denies fevers or chills. They had a friend that was sick last week.   1. COVID-19 virus infection     Outpatient Encounter Medications as of 10/17/2022  Medication Sig   molnupiravir EUA (LAGEVRIO) 200 mg CAPS capsule Take 4 capsules (800 mg total) by mouth 2 (two) times daily for 5 days.   acetaminophen (TYLENOL) 500 MG tablet Take 1 tablet (500 mg total) by mouth every 6 (six) hours as needed. (Patient taking differently: Take 500 mg by mouth every 6 (six) hours as needed for fever or mild pain.)   Multiple Vitamin (MULTIVITAMIN) tablet Take 1 tablet by mouth daily.   mupirocin ointment (BACTROBAN) 2 % Apply 1 Application topically 2 (two) times daily.   nabumetone (RELAFEN) 500 MG tablet TAKE 2 TABLETS BY MOUTH TWICE  DAILY FOR MUSCLE AND JOINT PAIN   pantoprazole (PROTONIX) 40 MG tablet Take 1 tablet (40 mg total) by mouth daily.   traZODone (DESYREL) 50 MG tablet Take 0.5-1 tablets (25-50 mg total) by mouth at bedtime as  needed for sleep.   No facility-administered encounter medications on file as of 10/17/2022.    Review of Systems  Constitutional:  Negative for chills and fever.  HENT:  Positive for congestion, postnasal drip, rhinorrhea and sore throat. Negative for ear discharge, ear pain, sinus pressure, sneezing and voice change.   Eyes:  Negative for pain, discharge, redness and visual disturbance.  Respiratory:  Positive for cough. Negative for shortness of breath and wheezing.   Cardiovascular:  Negative for chest pain and leg swelling.  Musculoskeletal:  Negative for gait problem.  Skin:  Negative for rash.  All other systems reviewed and are negative.   Observations/Objective: Patient sounds comfortable and in no acute distress  Assessment and Plan: Problem List Items Addressed This Visit   None Visit Diagnoses     COVID-19 virus infection    -  Primary   Relevant Medications   molnupiravir EUA (LAGEVRIO) 200 mg CAPS capsule       Recommend mucinex and molnupiravir Follow up plan: Return if symptoms worsen or fail to improve.     I discussed the assessment and treatment plan with the patient. The patient was provided an opportunity to ask questions and all were answered. The patient agreed with the plan and demonstrated an understanding of the instructions.   The patient was advised to call back or seek an in-person evaluation if the symptoms worsen or if the condition fails to improve as anticipated.  The  above assessment and management plan was discussed with the patient. The patient verbalized understanding of and has agreed to the management plan. Patient is aware to call the clinic if symptoms persist or worsen. Patient is aware when to return to the clinic for a follow-up visit. Patient educated on when it is appropriate to go to the emergency department.    I provided 6 minutes of non-face-to-face time during this encounter.    Worthy Rancher, MD

## 2022-10-25 ENCOUNTER — Encounter: Payer: Self-pay | Admitting: Family Medicine

## 2022-11-08 ENCOUNTER — Ambulatory Visit (INDEPENDENT_AMBULATORY_CARE_PROVIDER_SITE_OTHER): Payer: Medicare Other | Admitting: Family Medicine

## 2022-11-08 ENCOUNTER — Encounter: Payer: Self-pay | Admitting: Family Medicine

## 2022-11-08 VITALS — BP 126/78 | HR 78 | Ht 72.0 in | Wt 202.0 lb

## 2022-11-08 DIAGNOSIS — F03A0 Unspecified dementia, mild, without behavioral disturbance, psychotic disturbance, mood disturbance, and anxiety: Secondary | ICD-10-CM

## 2022-11-08 DIAGNOSIS — N3281 Overactive bladder: Secondary | ICD-10-CM

## 2022-11-08 DIAGNOSIS — R609 Edema, unspecified: Secondary | ICD-10-CM

## 2022-11-08 NOTE — Progress Notes (Signed)
BP 126/78   Pulse 78   Ht 6' (1.829 m)   Wt 202 lb (91.6 kg)   SpO2 98%   BMI 27.40 kg/m    Subjective:   Patient ID: Gregory Pearson, male    DOB: 08-Sep-1927, 89 y.o.   MRN: [id number]  HPI: Gregory Pearson is a 89 y.o. male presenting on 11/08/2022 for Edema (BLE)   HPI Patient is brought in by daughter today and says that he has been having increased memory issues and wants to do a Mini-Mental exam.  She says he has been forgetting more things and more frequently.  She is concerned about whether he is having normal memory for age or if he started to get a little bit of Alzheimer's.  Mini-Mental status today is 25    05/21/2018   11:49 AM 05/19/2017   11:02 AM 05/17/2016   10:48 AM  MMSE - Mini Mental State Exam  Orientation to time 5 5 5   Orientation to Place 5 5 5   Registration 3 3 3   Attention/ Calculation 3 2 2   Recall 3 3 3   Language- name 2 objects 2 2 2   Language- repeat 1 0 1  Language- follow 3 step command 3 3 3   Language- read & follow direction 1 1 1   Write a sentence 1 1 1   Copy design 1 1 1   Total score 28 26 27      She also saw some swelling in legs although it has been stable and he wears his compression stockings.  Patient sees urologist and started on Myrbetriq 50 mg and is doing well on it.  Relevant past medical, surgical, family and social history reviewed and updated as indicated. Interim medical history since our last visit reviewed. Allergies and medications reviewed and updated.  Review of Systems  Constitutional:  Negative for chills and fever.  Respiratory:  Negative for shortness of breath and wheezing.   Cardiovascular:  Positive for leg swelling. Negative for chest pain.  Musculoskeletal:  Negative for back pain and gait problem.  Skin:  Negative for rash.  Psychiatric/Behavioral:  Positive for confusion and decreased concentration. Negative for self-injury, sleep disturbance and suicidal ideas.   All other systems reviewed and are  negative.   Per HPI unless specifically indicated above   Allergies as of 11/08/2022   No Known Allergies      Medication List        Accurate as of November 08, 2022  3:26 PM. If you have any questions, ask your nurse or doctor.          acetaminophen 500 MG tablet Commonly known as: TYLENOL Take 1 tablet (500 mg total) by mouth every 6 (six) hours as needed. What changed: reasons to take this   multivitamin tablet Take 1 tablet by mouth daily.   mupirocin ointment 2 % Commonly known as: BACTROBAN Apply 1 Application topically 2 (two) times daily.   Myrbetriq 50 MG Tb24 tablet Generic drug: mirabegron ER Take 50 mg by mouth daily.   nabumetone 500 MG tablet Commonly known as: RELAFEN TAKE 2 TABLETS BY MOUTH TWICE  DAILY FOR MUSCLE AND JOINT PAIN   pantoprazole 40 MG tablet Commonly known as: PROTONIX Take 1 tablet (40 mg total) by mouth daily.   traZODone 50 MG tablet Commonly known as: DESYREL Take 0.5-1 tablets (25-50 mg total) by mouth at bedtime as needed for sleep.         Objective:   BP 126/78   Pulse  78   Ht 6' (1.829 m)   Wt 202 lb (91.6 kg)   SpO2 98%   BMI 27.40 kg/m   Wt Readings from Last 3 Encounters:  11/08/22 202 lb (91.6 kg)  07/26/22 220 lb (99.8 kg)  05/10/22 213 lb (96.6 kg)    Physical Exam Vitals and nursing note reviewed.  Constitutional:      General: He is not in acute distress.    Appearance: He is well-developed. He is not diaphoretic.  Eyes:     General: No scleral icterus.    Conjunctiva/sclera: Conjunctivae normal.  Neck:     Thyroid: No thyromegaly.  Cardiovascular:     Rate and Rhythm: Normal rate and regular rhythm.     Heart sounds: Normal heart sounds. No murmur heard. Pulmonary:     Effort: Pulmonary effort is normal. No respiratory distress.     Breath sounds: Normal breath sounds. No wheezing.  Musculoskeletal:        General: Swelling present. Normal range of motion.     Cervical back: Neck  supple.  Lymphadenopathy:     Cervical: No cervical adenopathy.  Skin:    General: Skin is warm and dry.     Findings: No rash.  Neurological:     Mental Status: He is alert and oriented to person, place, and time.     Coordination: Coordination normal.  Psychiatric:        Behavior: Behavior normal.       Assessment & Plan:   Problem List Items Addressed This Visit   None Visit Diagnoses     Peripheral edema    -  Primary   OAB (overactive bladder)       Mild dementia without behavioral disturbance, psychotic disturbance, mood disturbance, or anxiety, unspecified dementia type (HCC)           Continue compression stockings for edema.  Continue to see urology to take Myrbetriq for bladder.  Recommended that he consider Prevagen for memory and see if that helps. Follow up plan: Return if symptoms worsen or fail to improve.  Counseling provided for all of the vaccine components No orders of the defined types were placed in this encounter.   Caryl Pina, MD [facility] 11/08/2022, 3:26 PM

## 2022-11-18 ENCOUNTER — Encounter: Payer: Self-pay | Admitting: Family Medicine

## 2022-11-19 ENCOUNTER — Ambulatory Visit (INDEPENDENT_AMBULATORY_CARE_PROVIDER_SITE_OTHER): Payer: Medicare Other | Admitting: Family Medicine

## 2022-11-19 ENCOUNTER — Encounter: Payer: Self-pay | Admitting: Family Medicine

## 2022-11-19 VITALS — BP 120/73 | HR 70 | Temp 97.5°F | Ht 73.0 in | Wt 202.0 lb

## 2022-11-19 DIAGNOSIS — M170 Bilateral primary osteoarthritis of knee: Secondary | ICD-10-CM | POA: Diagnosis not present

## 2022-11-19 DIAGNOSIS — G8929 Other chronic pain: Secondary | ICD-10-CM

## 2022-11-19 DIAGNOSIS — R296 Repeated falls: Secondary | ICD-10-CM | POA: Diagnosis not present

## 2022-11-19 DIAGNOSIS — M545 Low back pain, unspecified: Secondary | ICD-10-CM | POA: Diagnosis not present

## 2022-11-19 NOTE — Progress Notes (Signed)
Subjective:  Patient ID: Gregory Pearson, male    DOB: 1928-03-12, 89 y.o.   MRN: [id number]  Patient Care Team: Dettinger, Fransisca Kaufmann, MD as PCP - General (Family Medicine)   Chief Complaint:  rx (Wheelchair lift for car )   HPI: Gregory Pearson is a 89 y.o. male presenting on 11/19/2022 for rx (Wheelchair lift for car )    1. Primary osteoarthritis of both knees 2. Frequent falls 3. Chronic bilateral low back pain without sciatica Pt presents today to get an order for a wheelchair lift for his vehicle. He has chronic low back, instability of gait, frequent falls, and bilateral knee OA. He uses a walker in the home and has to have a wheelchair when out in public as he can not walk without assistance.      Relevant past medical, surgical, family, and social history reviewed and updated as indicated.  Allergies and medications reviewed and updated. Data reviewed: Chart in Epic.   Past Medical History:  Diagnosis Date   Asbestos exposure    Blood transfusion without reported diagnosis    after ruptured appendix 1963   Diverticulosis    Gastric ulcer 06/2019   GERD (gastroesophageal reflux disease)    History of stomach ulcers 1995   H.pylori s/p treatment   History of stomach ulcers    Hyperlipidemia    Muscle strain of right gluteal region 01/24/2014   Prostate cancer ([place]) 2002   prostatectomy   Traumatic hematoma of buttock 01/24/2014    Past Surgical History:  Procedure Laterality Date   APPENDECTOMY  1963   ruptured appendix/gangrene   BIOPSY  07/06/2019   Procedure: BIOPSY;  Surgeon: Daneil Dolin, MD;  [facility];  Service: Endoscopy;;   COLONOSCOPY  2013   Dr. Fuller Plan: multiple colon polyps (tubular adenomas), diverticulosis. no further surveillance colonoscopies due to age.   ESOPHAGOGASTRODUODENOSCOPY  [id number]   Dr. Fuller Plan: gastric ulcer (h.pylori + s/p tx), follow up EGD verified ulcer healing   ESOPHAGOGASTRODUODENOSCOPY N/A 09/26/2016   Procedure:  ESOPHAGOGASTRODUODENOSCOPY (EGD);  Surgeon: Daneil Dolin, MD;  [facility];  Service: Endoscopy;  Laterality: N/A;   ESOPHAGOGASTRODUODENOSCOPY N/A 01/23/2017   Procedure: ESOPHAGOGASTRODUODENOSCOPY (EGD);  Surgeon: Daneil Dolin, MD;  [facility];  Service: Endoscopy;  Laterality: N/A;  12:15pm   ESOPHAGOGASTRODUODENOSCOPY N/A 07/06/2019   Procedure: ESOPHAGOGASTRODUODENOSCOPY (EGD);  Surgeon: Daneil Dolin, MD; normal esophagus, nonbleeding gastric ulcer with pigmented material, abnormal gastric mucosa of uncertain significance s/p biopsy, normal duodenum.  Pathology with mild reactive gastropathy, mild chronic gastritis, H. pylori negative.   ESOPHAGOGASTRODUODENOSCOPY N/A 10/20/2019   Procedure: ESOPHAGOGASTRODUODENOSCOPY (EGD);  Surgeon: Daneil Dolin, MD;  [facility];  Service: Endoscopy;  Laterality: N/A;  9:30am   [place]  2003   PROSTATECTOMY  2002    Social History   Socioeconomic History   Marital status: Widowed    Spouse name: Not on file   Number of children: 3   Years of education: Not on file   Highest education level: Not on file  Occupational History   Occupation: retired Conservation officer, historic buildings power  Tobacco Use   Smoking status: Never   Smokeless tobacco: Never  Vaping Use   Vaping Use: Never used  Substance and Sexual Activity   Alcohol use: Not Currently    Comment: last drank about 15 years ago   Drug use: Never   Sexual activity: Not on  file  Other Topics Concern   Not on file  Social History Narrative   3 children.   Widowed since 01/16/2021. Married x 61 years.    Social Determinants of Health   Financial Resource Strain: Low Risk  (07/26/2022)   Overall Financial Resource Strain (CARDIA)    Difficulty of Paying Living Expenses: Not hard at all  Food Insecurity: No Food Insecurity (07/26/2022)   Hunger Vital Sign    Worried About Running Out of Food in the Last Year: Never true     Ran Out of Food in the Last Year: Never true  Transportation Needs: No Transportation Needs (07/26/2022)   PRAPARE - Hydrologist (Medical): No    Lack of Transportation (Non-Medical): No  Physical Activity: Insufficiently Active (07/26/2022)   Exercise Vital Sign    Days of Exercise per Week: 3 days    Minutes of Exercise per Session: 30 min  Stress: No Stress Concern Present (07/26/2022)   [place]    Feeling of Stress : Not at all  Social Connections: Moderately Integrated (07/26/2022)   Social Connection and Isolation Panel [NHANES]    Frequency of Communication with Friends and Family: More than three times a week    Frequency of Social Gatherings with Friends and Family: More than three times a week    Attends Religious Services: More than 4 times per year    Active Member of [employer] or Organizations: Yes    Attends Archivist Meetings: More than 4 times per year    Marital Status: Widowed  Intimate Partner Violence: Not At Risk (07/26/2022)   Humiliation, Afraid, Rape, and Kick questionnaire    Fear of Current or Ex-Partner: No    Emotionally Abused: No    Physically Abused: No    Sexually Abused: No    Outpatient Encounter Medications as of 11/19/2022  Medication Sig   acetaminophen (TYLENOL) 500 MG tablet Take 1 tablet (500 mg total) by mouth every 6 (six) hours as needed. (Patient taking differently: Take 500 mg by mouth every 6 (six) hours as needed for fever or mild pain.)   mirabegron ER (MYRBETRIQ) 50 MG TB24 tablet Take 50 mg by mouth daily.   Multiple Vitamin (MULTIVITAMIN) tablet Take 1 tablet by mouth daily.   mupirocin ointment (BACTROBAN) 2 % Apply 1 Application topically 2 (two) times daily.   nabumetone (RELAFEN) 500 MG tablet TAKE 2 TABLETS BY MOUTH TWICE  DAILY FOR MUSCLE AND JOINT PAIN   pantoprazole (PROTONIX) 40 MG tablet Take 1 tablet (40 mg total) by  mouth daily.   traZODone (DESYREL) 50 MG tablet Take 0.5-1 tablets (25-50 mg total) by mouth at bedtime as needed for sleep.   No facility-administered encounter medications on file as of 11/19/2022.    No Known Allergies  Review of Systems      Objective:  BP 120/73   Pulse 70   Temp (!) 97.5 F (36.4 C) (Temporal)   Ht 6\' 1"  (1.854 m)   Wt 202 lb (91.6 kg)   SpO2 94%   BMI 26.65 kg/m    Wt Readings from Last 3 Encounters:  11/19/22 202 lb (91.6 kg)  11/08/22 202 lb (91.6 kg)  07/26/22 220 lb (99.8 kg)    Physical Exam Vitals and nursing note reviewed.  Constitutional:      General: He is not in acute distress.    Appearance: He is not ill-appearing, toxic-appearing  or diaphoretic.     Comments: Frail elderly  HENT:     Head: Normocephalic and atraumatic.     Mouth/Throat:     Mouth: Mucous membranes are moist.  Eyes:     Conjunctiva/sclera: Conjunctivae normal.     Pupils: Pupils are equal, round, and reactive to light.  Cardiovascular:     Rate and Rhythm: Normal rate and regular rhythm.     Heart sounds: Normal heart sounds.  Pulmonary:     Effort: Pulmonary effort is normal.     Breath sounds: Normal breath sounds.  Skin:    General: Skin is warm and dry.     Capillary Refill: Capillary refill takes less than 2 seconds.  Neurological:     General: No focal deficit present.     Mental Status: He is alert and oriented to person, place, and time.     Gait: Abnormal gait: using rolling walker.  Psychiatric:        Mood and Affect: Mood normal.        Behavior: Behavior normal.        Thought Content: Thought content normal.        Judgment: Judgment normal.     Results for orders placed or performed in visit on 05/10/22  Urine Culture   Specimen: Urine   UR  Result Value Ref Range   Urine Culture, Routine Final report    Organism ID, Bacteria Comment   Urinalysis  Result Value Ref Range   Specific Gravity, UA 1.015 1.005 - 1.030   pH, UA 7.0  5.0 - 7.5   Color, UA Yellow Yellow   Appearance Ur Clear Clear   Leukocytes,UA Negative Negative   Protein,UA Negative Negative/Trace   Glucose, UA Negative Negative   Ketones, UA Trace (A) Negative   RBC, UA Trace (A) Negative   Bilirubin, UA Negative Negative   Urobilinogen, Ur 0.2 0.2 - 1.0 mg/dL   Nitrite, UA Negative Negative  CBC with Differential/Platelet  Result Value Ref Range   WBC 6.6 3.4 - 10.8 x10E3/uL   RBC 4.71 4.14 - 5.80 x10E6/uL   Hemoglobin 15.0 13.0 - 17.7 g/dL   Hematocrit 44.9 37.5 - 51.0 %   MCV 95 79 - 97 fL   MCH 31.8 26.6 - 33.0 pg   MCHC 33.4 31.5 - 35.7 g/dL   RDW 11.5 (L) 11.6 - 15.4 %   Platelets 283 150 - 450 x10E3/uL   Neutrophils 72 Not Estab. %   Lymphs 17 Not Estab. %   Monocytes 9 Not Estab. %   Eos 1 Not Estab. %   Basos 1 Not Estab. %   Neutrophils Absolute 4.7 1.4 - 7.0 x10E3/uL   Lymphocytes Absolute 1.1 0.7 - 3.1 x10E3/uL   Monocytes Absolute 0.6 0.1 - 0.9 x10E3/uL   EOS (ABSOLUTE) 0.0 0.0 - 0.4 x10E3/uL   Basophils Absolute 0.1 0.0 - 0.2 x10E3/uL   Immature Granulocytes 0 Not Estab. %   Immature Grans (Abs) 0.0 0.0 - 0.1 x10E3/uL  CMP14+EGFR  Result Value Ref Range   Glucose 104 (H) 70 - 99 mg/dL   BUN 14 10 - 36 mg/dL   Creatinine, Ser 0.91 0.76 - 1.27 mg/dL   eGFR 79 >59 mL/min/1.73   BUN/Creatinine Ratio 15 10 - 24   Sodium 139 134 - 144 mmol/L   Potassium 4.7 3.5 - 5.2 mmol/L   Chloride 101 96 - 106 mmol/L   CO2 23 20 - 29 mmol/L   Calcium 9.2  8.6 - 10.2 mg/dL   Total Protein 6.8 6.0 - 8.5 g/dL   Albumin 4.3 3.6 - 4.6 g/dL   Globulin, Total 2.5 1.5 - 4.5 g/dL   Albumin/Globulin Ratio 1.7 1.2 - 2.2   Bilirubin Total 0.5 0.0 - 1.2 mg/dL   Alkaline Phosphatase 104 44 - 121 IU/L   AST 18 0 - 40 IU/L   ALT 19 0 - 44 IU/L       Pertinent labs & imaging results that were available during my care of the patient were reviewed by me and considered in my medical decision making.  Assessment & Plan:  Levontae was seen today  for rx.  Diagnoses and all orders for this visit:  Primary osteoarthritis of both knees Frequent falls Chronic bilateral low back pain without sciatica Has to use walker and wheelchair at all times. Needs a wheelchair lift for his car. Wheelchair lift for vehicle DME order provided.  -     For home use only DME Other see comment     Continue all other maintenance medications.  Follow up plan: Return if symptoms worsen or fail to improve.   Continue healthy lifestyle choices, including diet (rich in fruits, vegetables, and lean proteins, and low in salt and simple carbohydrates) and exercise (at least 30 minutes of moderate physical activity daily).    The above assessment and management plan was discussed with the patient. The patient verbalized understanding of and has agreed to the management plan. Patient is aware to call the clinic if they develop any new symptoms or if symptoms persist or worsen. Patient is aware when to return to the clinic for a follow-up visit. Patient educated on when it is appropriate to go to the emergency department.   Monia Pouch, FNP-C [facility] [phone]

## 2022-11-26 ENCOUNTER — Other Ambulatory Visit: Payer: Self-pay | Admitting: Family Medicine

## 2022-12-11 ENCOUNTER — Encounter: Payer: Self-pay | Admitting: Family Medicine

## 2022-12-11 ENCOUNTER — Ambulatory Visit (INDEPENDENT_AMBULATORY_CARE_PROVIDER_SITE_OTHER): Payer: Medicare Other | Admitting: Family Medicine

## 2022-12-11 VITALS — BP 139/83 | HR 94 | Temp 97.9°F | Ht 73.0 in | Wt 208.4 lb

## 2022-12-11 DIAGNOSIS — R051 Acute cough: Secondary | ICD-10-CM | POA: Diagnosis not present

## 2022-12-11 DIAGNOSIS — J069 Acute upper respiratory infection, unspecified: Secondary | ICD-10-CM | POA: Diagnosis not present

## 2022-12-11 MED ORDER — AZITHROMYCIN 250 MG PO TABS: ORAL_TABLET | ORAL | 0 refills | Status: DC

## 2022-12-11 MED ORDER — PREDNISONE 20 MG PO TABS: 40.0000 mg | ORAL_TABLET | Freq: Every day | ORAL | 0 refills | Status: AC

## 2022-12-11 NOTE — Progress Notes (Signed)
Subjective:  Patient ID: Gregory Pearson, male    DOB: Jun 24, 1928, 89 y.o.   MRN: [id number]  Patient Care Team: Dettinger, Elige Radon, MD as PCP - General (Family Medicine)   Chief Complaint:  Cough, Nasal Congestion, and Fatigue (X 3 days. Otc night quail )   HPI: Gregory Pearson is a 89 y.o. male presenting on 12/11/2022 for Cough, Nasal Congestion, and Fatigue (X 3 days. Otc night quail )   Pt presents today with complaints of cough, congestion, fatigue, and rhinorrhea for the last 3-4 days. He has been taking DayQuil and NyQuil at home with some relief of symptoms. No measured fever. No chest pain, leg swelling, weakness, or confusion.   Cough This is a new problem. Episode onset: 3-4 days ago. The problem has been gradually worsening. Associated symptoms include postnasal drip, rhinorrhea and shortness of breath. Pertinent negatives include no chest pain, chills, ear congestion, ear pain, fever, headaches, heartburn, hemoptysis, myalgias, nasal congestion, rash, sore throat, sweats, weight loss or wheezing. He has tried OTC cough suppressant for the symptoms. The treatment provided mild relief.    Relevant past medical, surgical, family, and social history reviewed and updated as indicated.  Allergies and medications reviewed and updated. Data reviewed: Chart in Epic.   Past Medical History:  Diagnosis Date   Asbestos exposure    Blood transfusion without reported diagnosis    after ruptured appendix 1963   Diverticulosis    Gastric ulcer 06/2019   GERD (gastroesophageal reflux disease)    History of stomach ulcers 1995   H.pylori s/p treatment   History of stomach ulcers    Hyperlipidemia    Muscle strain of right gluteal region 01/24/2014   Prostate cancer 2002   prostatectomy   Traumatic hematoma of buttock 01/24/2014    Past Surgical History:  Procedure Laterality Date   APPENDECTOMY  1963   ruptured appendix/gangrene   BIOPSY  07/06/2019   Procedure: BIOPSY;  Surgeon:  Corbin Ade, MD;  [facility];  Service: Endoscopy;;   COLONOSCOPY  2013   Dr. Russella Dar: multiple colon polyps (tubular adenomas), diverticulosis. no further surveillance colonoscopies due to age.   ESOPHAGOGASTRODUODENOSCOPY  5520,8022   Dr. Russella Dar: gastric ulcer (h.pylori + s/p tx), follow up EGD verified ulcer healing   ESOPHAGOGASTRODUODENOSCOPY N/A 09/26/2016   Procedure: ESOPHAGOGASTRODUODENOSCOPY (EGD);  Surgeon: Corbin Ade, MD;  [facility];  Service: Endoscopy;  Laterality: N/A;   ESOPHAGOGASTRODUODENOSCOPY N/A 01/23/2017   Procedure: ESOPHAGOGASTRODUODENOSCOPY (EGD);  Surgeon: Corbin Ade, MD;  [facility];  Service: Endoscopy;  Laterality: N/A;  12:15pm   ESOPHAGOGASTRODUODENOSCOPY N/A 07/06/2019   Procedure: ESOPHAGOGASTRODUODENOSCOPY (EGD);  Surgeon: Corbin Ade, MD; normal esophagus, nonbleeding gastric ulcer with pigmented material, abnormal gastric mucosa of uncertain significance s/p biopsy, normal duodenum.  Pathology with mild reactive gastropathy, mild chronic gastritis, H. pylori negative.   ESOPHAGOGASTRODUODENOSCOPY N/A 10/20/2019   Procedure: ESOPHAGOGASTRODUODENOSCOPY (EGD);  Surgeon: Corbin Ade, MD;  [facility];  Service: Endoscopy;  Laterality: N/A;  9:30am   HEMORRHOID SURGERY  1970   LAPAROSCOPIC CHOLECYSTECTOMY  2003   PROSTATECTOMY  2002    Social History   Socioeconomic History   Marital status: Widowed    Spouse name: Not on file   Number of children: 3   Years of education: Not on file   Highest education level: Not on file  Occupational History   Occupation: retired Psychiatric nurse power  Tobacco Use  Smoking status: Never   Smokeless tobacco: Never  Vaping Use   Vaping Use: Never used  Substance and Sexual Activity   Alcohol use: Not Currently    Comment: last drank about 15 years ago   Drug use: Never   Sexual activity: Not on file  Other Topics Concern   Not on file  Social History  Narrative   3 children.   Widowed since 01/16/2021. Married x 61 years.    Social Determinants of Health   Financial Resource Strain: Low Risk  (07/26/2022)   Overall Financial Resource Strain (CARDIA)    Difficulty of Paying Living Expenses: Not hard at all  Food Insecurity: No Food Insecurity (07/26/2022)   Hunger Vital Sign    Worried About Running Out of Food in the Last Year: Never true    Ran Out of Food in the Last Year: Never true  Transportation Needs: No Transportation Needs (07/26/2022)   PRAPARE - Administrator, Civil Service (Medical): No    Lack of Transportation (Non-Medical): No  Physical Activity: Insufficiently Active (07/26/2022)   Exercise Vital Sign    Days of Exercise per Week: 3 days    Minutes of Exercise per Session: 30 min  Stress: No Stress Concern Present (07/26/2022)   [employer] of Occupational Health - Occupational Stress Questionnaire    Feeling of Stress : Not at all  Social Connections: Moderately Integrated (07/26/2022)   Social Connection and Isolation Panel [NHANES]    Frequency of Communication with Friends and Family: More than three times a week    Frequency of Social Gatherings with Friends and Family: More than three times a week    Attends Religious Services: More than 4 times per year    Active Member of [employer] or Organizations: Yes    Attends Banker Meetings: More than 4 times per year    Marital Status: Widowed  Intimate Partner Violence: Not At Risk (07/26/2022)   Humiliation, Afraid, Rape, and Kick questionnaire    Fear of Current or Ex-Partner: No    Emotionally Abused: No    Physically Abused: No    Sexually Abused: No    Outpatient Encounter Medications as of 12/11/2022  Medication Sig   acetaminophen (TYLENOL) 500 MG tablet Take 1 tablet (500 mg total) by mouth every 6 (six) hours as needed. (Patient taking differently: Take 500 mg by mouth every 6 (six) hours as needed for fever or mild pain.)    azithromycin (ZITHROMAX Z-PAK) 250 MG tablet As directed   mirabegron ER (MYRBETRIQ) 50 MG TB24 tablet Take 50 mg by mouth daily.   Multiple Vitamin (MULTIVITAMIN) tablet Take 1 tablet by mouth daily.   mupirocin ointment (BACTROBAN) 2 % Apply 1 Application topically 2 (two) times daily.   nabumetone (RELAFEN) 500 MG tablet TAKE 2 TABLETS BY MOUTH TWICE  DAILY FOR MUSCLE AND JOINT PAIN   pantoprazole (PROTONIX) 40 MG tablet Take 1 tablet (40 mg total) by mouth daily.   predniSONE (DELTASONE) 20 MG tablet Take 2 tablets (40 mg total) by mouth daily with breakfast for 5 days.   traZODone (DESYREL) 50 MG tablet Take 0.5-1 tablets (25-50 mg total) by mouth at bedtime as needed for sleep.   No facility-administered encounter medications on file as of 12/11/2022.    No Known Allergies  Review of Systems  Constitutional:  Positive for activity change, appetite change and fatigue. Negative for chills, diaphoresis, fever, unexpected weight change and weight loss.  HENT:  Positive for congestion, postnasal drip and rhinorrhea. Negative for dental problem, drooling, ear discharge, ear pain, facial swelling, hearing loss, mouth sores, nosebleeds, sinus pressure, sinus pain, sneezing, sore throat, tinnitus, trouble swallowing and voice change.   Eyes:  Negative for photophobia and visual disturbance.  Respiratory:  Positive for cough and shortness of breath. Negative for apnea, hemoptysis, choking, chest tightness, wheezing and stridor.   Cardiovascular:  Negative for chest pain, palpitations and leg swelling.  Gastrointestinal:  Negative for heartburn.  Genitourinary:  Negative for decreased urine volume and difficulty urinating.  Musculoskeletal:  Negative for arthralgias and myalgias.  Skin:  Negative for rash.  Neurological:  Negative for weakness and headaches.  Psychiatric/Behavioral:  Negative for confusion.         Objective:  BP 139/83   Pulse 94   Temp 97.9 F (36.6 C) (Temporal)   Ht   (1.854 m)   Wt 208 lb 6.4 oz (94.5 kg)   SpO2 95%   BMI 27.50 kg/m    Wt Readings from Last 3 Encounters:  12/11/22 208 lb 6.4 oz (94.5 kg)  11/19/22 202 lb (91.6 kg)  11/08/22 202 lb (91.6 kg)    Physical Exam Vitals and nursing note reviewed.  Constitutional:      General: He is not in acute distress.    Appearance: He is not ill-appearing, toxic-appearing or diaphoretic.     Comments: Frail Elderly  HENT:     Head: Normocephalic and atraumatic.     Right Ear: Tympanic membrane, ear canal and external ear normal.     Left Ear: Tympanic membrane, ear canal and external ear normal.     Nose: Congestion present. No rhinorrhea.     Mouth/Throat:     Mouth: Mucous membranes are moist.     Pharynx: Oropharynx is clear. No oropharyngeal exudate or posterior oropharyngeal erythema.  Eyes:     Conjunctiva/sclera: Conjunctivae normal.     Pupils: Pupils are equal, round, and reactive to light.  Cardiovascular:     Rate and Rhythm: Normal rate and regular rhythm.     Heart sounds: Normal heart sounds.  Pulmonary:     Effort: Pulmonary effort is normal.     Breath sounds: Examination of the right-lower field reveals rhonchi. Examination of the left-lower field reveals rhonchi. Wheezing and rhonchi (clears with cough) present.  Musculoskeletal:     Cervical back: Neck supple.  Lymphadenopathy:     Cervical: No cervical adenopathy.  Skin:    General: Skin is warm and dry.     Capillary Refill: Capillary refill takes less than 2 seconds.  Neurological:     General: No focal deficit present.     Mental Status: He is alert and oriented to person, place, and time.     Gait: Gait abnormal (using rollator).  Psychiatric:        Mood and Affect: Mood normal.        Behavior: Behavior normal.        Thought Content: Thought content normal.        Judgment: Judgment normal.     Results for orders placed or performed in visit on 05/10/22  Urine Culture   Specimen: Urine   UR   Result Value Ref Range   Urine Culture, Routine Final report    Organism ID, Bacteria Comment   Urinalysis  Result Value Ref Range   Specific Gravity, UA 1.015 1.005 - 1.030   pH, UA 7.0 5.0 - 7.5  Color, UA Yellow Yellow   Appearance Ur Clear Clear   Leukocytes,UA Negative Negative   Protein,UA Negative Negative/Trace   Glucose, UA Negative Negative   Ketones, UA Trace (A) Negative   RBC, UA Trace (A) Negative   Bilirubin, UA Negative Negative   Urobilinogen, Ur 0.2 0.2 - 1.0 mg/dL   Nitrite, UA Negative Negative  CBC with Differential/Platelet  Result Value Ref Range   WBC 6.6 3.4 - 10.8 x10E3/uL   RBC 4.71 4.14 - 5.80 x10E6/uL   Hemoglobin 15.0 13.0 - 17.7 g/dL   Hematocrit 40.9 81.1 - 51.0 %   MCV 95 79 - 97 fL   MCH 31.8 26.6 - 33.0 pg   MCHC 33.4 31.5 - 35.7 g/dL   RDW 91.4 (L) 78.2 - 95.6 %   Platelets 283 150 - 450 x10E3/uL   Neutrophils 72 Not Estab. %   Lymphs 17 Not Estab. %   Monocytes 9 Not Estab. %   Eos 1 Not Estab. %   Basos 1 Not Estab. %   Neutrophils Absolute 4.7 1.4 - 7.0 x10E3/uL   Lymphocytes Absolute 1.1 0.7 - 3.1 x10E3/uL   Monocytes Absolute 0.6 0.1 - 0.9 x10E3/uL   EOS (ABSOLUTE) 0.0 0.0 - 0.4 x10E3/uL   Basophils Absolute 0.1 0.0 - 0.2 x10E3/uL   Immature Granulocytes 0 Not Estab. %   Immature Grans (Abs) 0.0 0.0 - 0.1 x10E3/uL  CMP14+EGFR  Result Value Ref Range   Glucose 104 (H) 70 - 99 mg/dL   BUN 14 10 - 36 mg/dL   Creatinine, Ser 2.13 0.76 - 1.27 mg/dL   eGFR 79 >08 MV/HQI/6.96   BUN/Creatinine Ratio 15 10 - 24   Sodium 139 134 - 144 mmol/L   Potassium 4.7 3.5 - 5.2 mmol/L   Chloride 101 96 - 106 mmol/L   CO2 23 20 - 29 mmol/L   Calcium 9.2 8.6 - 10.2 mg/dL   Total Protein 6.8 6.0 - 8.5 g/dL   Albumin 4.3 3.6 - 4.6 g/dL   Globulin, Total 2.5 1.5 - 4.5 g/dL   Albumin/Globulin Ratio 1.7 1.2 - 2.2   Bilirubin Total 0.5 0.0 - 1.2 mg/dL   Alkaline Phosphatase 104 44 - 121 IU/L   AST 18 0 - 40 IU/L   ALT 19 0 - 44 IU/L        Pertinent labs & imaging results that were available during my care of the patient were reviewed by me and considered in my medical decision making.  Assessment & Plan:  Allard was seen today for cough, nasal congestion and fatigue.  Diagnoses and all orders for this visit:  Acute cough -     COVID-19, Flu A+B and RSV  URI with cough and congestion Viral testing pending. Due to age, symptoms, and comorbidities, will treat with azithromycin and prednisone. Continue symptomatic care at home. Red flags discussed in detail. Report new, worsening, or persistent symptoms.  -     azithromycin (ZITHROMAX Z-PAK) 250 MG tablet; As directed -     predniSONE (DELTASONE) 20 MG tablet; Take 2 tablets (40 mg total) by mouth daily with breakfast for 5 days.     Continue all other maintenance medications.  Follow up plan: Return in 1 week (on 12/18/2022), or if symptoms worsen or fail to improve, for URI.   Continue healthy lifestyle choices, including diet (rich in fruits, vegetables, and lean proteins, and low in salt and simple carbohydrates) and exercise (at least 30 minutes of moderate physical activity  daily).  Educational handout given for URI  The above assessment and management plan was discussed with the patient. The patient verbalized understanding of and has agreed to the management plan. Patient is aware to call the clinic if they develop any new symptoms or if symptoms persist or worsen. Patient is aware when to return to the clinic for a follow-up visit. Patient educated on when it is appropriate to go to the emergency department.   Kari Baars, FNP-C [facility] [phone]

## 2022-12-12 LAB — COVID-19, FLU A+B AND RSV
Influenza A, NAA: NOT DETECTED
Influenza B, NAA: NOT DETECTED
RSV, NAA: NOT DETECTED
SARS-CoV-2, NAA: NOT DETECTED

## 2023-03-16 ENCOUNTER — Other Ambulatory Visit: Payer: Self-pay | Admitting: Family Medicine

## 2023-03-17 ENCOUNTER — Telehealth: Payer: Self-pay | Admitting: Family Medicine

## 2023-03-17 NOTE — Telephone Encounter (Signed)
Patient is having back issues and wants to talk to PCP about it, aware that he is gone for the day and we are unsure of when he will be back in office at this time. Offered appt with another provider and he only wants to see PCP. Please call back and advise. Leave message in detail if he does not answer.

## 2023-03-18 NOTE — Telephone Encounter (Signed)
Pt ok to wait until Aug 12. First available with [facility].  Pt states that he had a fall and his back is bruised but he believes it is improving. He confirms that he did not hit his head.  Pt will call and cancel if better by the 12th.

## 2023-04-01 ENCOUNTER — Encounter (HOSPITAL_COMMUNITY): Payer: Self-pay

## 2023-04-01 ENCOUNTER — Other Ambulatory Visit: Payer: Self-pay

## 2023-04-02 ENCOUNTER — Encounter (HOSPITAL_COMMUNITY)
Admission: RE | Admit: 2023-04-02 | Discharge: 2023-04-02 | Disposition: A | Discharge status: Home / Self Care | Payer: Medicare Other | Source: Ambulatory Visit | Attending: Optometry | Admitting: Optometry

## 2023-04-03 NOTE — H&P (Signed)
Surgical History & Physical  Patient Name: Gregory Pearson  DOB: 1927/09/30  Surgery: Cataract extraction with intraocular lens implant phacoemulsification; Left Eye Surgeon: Pecolia Ades MD Surgery Date: 04/04/2023 Pre-Op Date: 02/19/2023  HPI: A 62 Yr. old male patient 2 month follow up to reassess cataracts. Patient states he would like to proceed with the surgery. He has questions about his insurance helping to pay. Patient is having difficulties watching TV and reading captions/ball scores on the TV. This seems to be worsening. This is negatively affecting the patient's quality of life and the patient is unable to function adequately in life with the current level of vision.  Medical History:  Arthritis Cancer kidney problems  Social Never Smoked  Medication Myrbetriq, Nabumetone, Trazodone  Sx/Procedures Appendectomy, Gallbladder Removal, Hemmoroid removed, Sx on leg- Lt  Drug Allergies  NKDA  Review of Systems Negative Allergic/Immunologic Negative Cardiovascular Negative Constitutional Negative Ear, Nose, Mouth & Throat Negative Endocrine Negative Eyes Negative Gastrointestinal Negative Genitourinary Negative Hematologic/Lymphatic Negative Integumentary Arthrisit Musculoskeletal Negative Neurological Negative Psychiatry Negative Respiratory  History & Physical: Heent: cataracts NECK: supple without bruits LUNGS: lungs clear to auscultation CV: regular rate and rhythm Abdomen: soft and non-tender  Impression & Plan: Assessment: 1.  CATARACT AGE-RELATED NUCLEAR; Both Eyes (H25.13) 2.  Hyperopia ; Both Eyes (H52.03) 3.  Presbyopia ; Both Eyes (H52.4)  Plan: 1.  Cataracts are visually significant and account for the patient's complaints. Discussed all risks, benefits, procedures and recovery, including infection, loss of vision and eye, need for glasses after surgery or additional procedures. Patient understands changing glasses will not improve vision.  Patient indicated understanding of procedure. All questions answered. Patient desires to have surgery, recommend phacoemulsification with intraocular lens. Patient to have preliminary testing necessary (Argos/IOL Master, Mac OCT, TOPO) Educational materials provided.  Plan: - Proceed with cataract surgery OS, followed by OD -interested in toric lenses, plan for SN6AT lens - ok with lying flat during surgery - no DM, no prior eye surgeries - Omidria  2.  Compared MRx to current glasses. Will continue with current for now  3.  Compared MRx to current glasses. Will continue with current for now

## 2023-04-04 ENCOUNTER — Encounter (HOSPITAL_COMMUNITY): Payer: Self-pay | Admitting: Optometry

## 2023-04-04 ENCOUNTER — Ambulatory Visit (HOSPITAL_BASED_OUTPATIENT_CLINIC_OR_DEPARTMENT_OTHER): Payer: Medicare Other | Admitting: Anesthesiology

## 2023-04-04 ENCOUNTER — Ambulatory Visit (HOSPITAL_COMMUNITY)
Admission: RE | Admit: 2023-04-04 | Discharge: 2023-04-04 | Disposition: A | Discharge status: Home / Self Care | Payer: Medicare Other | Attending: Optometry | Admitting: Optometry

## 2023-04-04 ENCOUNTER — Ambulatory Visit (HOSPITAL_COMMUNITY): Payer: Medicare Other | Admitting: Anesthesiology

## 2023-04-04 ENCOUNTER — Other Ambulatory Visit: Payer: Self-pay

## 2023-04-04 ENCOUNTER — Encounter (HOSPITAL_COMMUNITY)
Admission: RE | Disposition: A | Discharge status: Home / Self Care | Payer: Self-pay | Source: Home / Self Care | Attending: Optometry

## 2023-04-04 DIAGNOSIS — M1990 Unspecified osteoarthritis, unspecified site: Secondary | ICD-10-CM | POA: Diagnosis not present

## 2023-04-04 DIAGNOSIS — H2512 Age-related nuclear cataract, left eye: Secondary | ICD-10-CM | POA: Diagnosis present

## 2023-04-04 DIAGNOSIS — E785 Hyperlipidemia, unspecified: Secondary | ICD-10-CM | POA: Diagnosis not present

## 2023-04-04 DIAGNOSIS — K219 Gastro-esophageal reflux disease without esophagitis: Secondary | ICD-10-CM | POA: Insufficient documentation

## 2023-04-04 HISTORY — PX: CATARACT EXTRACTION W/PHACO: SHX586

## 2023-04-04 SURGERY — PHACOEMULSIFICATION, CATARACT, WITH IOL INSERTION
Anesthesia: Monitor Anesthesia Care | Site: Eye | Laterality: Left

## 2023-04-04 MED ORDER — STERILE WATER FOR IRRIGATION IR SOLN
Status: DC | PRN
  Administered 2023-04-04: 1

## 2023-04-04 MED ORDER — PHENYLEPHRINE HCL 2.5 % OP SOLN
1.0000 [drp] | OPHTHALMIC | Status: AC | PRN
  Administered 2023-04-04 (×3): 1 [drp] via OPHTHALMIC

## 2023-04-04 MED ORDER — POVIDONE-IODINE 5 % OP SOLN
OPHTHALMIC | Status: DC | PRN
  Administered 2023-04-04: 1 via OPHTHALMIC

## 2023-04-04 MED ORDER — FENTANYL CITRATE (PF) 100 MCG/2ML IJ SOLN
INTRAMUSCULAR | Status: AC
  Filled 2023-04-04: qty 2

## 2023-04-04 MED ORDER — TETRACAINE HCL 0.5 % OP SOLN
1.0000 [drp] | OPHTHALMIC | Status: AC | PRN
  Administered 2023-04-04 (×3): 1 [drp] via OPHTHALMIC

## 2023-04-04 MED ORDER — LIDOCAINE HCL (PF) 1 % IJ SOLN
INTRAMUSCULAR | Status: DC | PRN
  Administered 2023-04-04: 1 mL

## 2023-04-04 MED ORDER — TROPICAMIDE 1 % OP SOLN
1.0000 [drp] | OPHTHALMIC | Status: AC | PRN
  Administered 2023-04-04 (×3): 1 [drp] via OPHTHALMIC

## 2023-04-04 MED ORDER — NEOMYCIN-POLYMYXIN-DEXAMETH 3.5-10000-0.1 OP SUSP
OPHTHALMIC | Status: DC | PRN
  Administered 2023-04-04: 2 [drp] via OPHTHALMIC

## 2023-04-04 MED ORDER — BSS IO SOLN
INTRAOCULAR | Status: DC | PRN
  Administered 2023-04-04: 15 mL via INTRAOCULAR

## 2023-04-04 MED ORDER — SIGHTPATH DOSE#1 NA HYALUR & NA CHOND-NA HYALUR IO KIT
PACK | INTRAOCULAR | Status: DC | PRN
  Administered 2023-04-04: 1 via OPHTHALMIC

## 2023-04-04 MED ORDER — LIDOCAINE HCL 3.5 % OP GEL: 1.0000 | Freq: Once | OPHTHALMIC | Status: DC

## 2023-04-04 MED ORDER — PHENYLEPHRINE-KETOROLAC 1-0.3 % IO SOLN
INTRAOCULAR | Status: DC | PRN
  Administered 2023-04-04: 500 mL via OPHTHALMIC

## 2023-04-04 SURGICAL SUPPLY — 14 items
CATARACT SUITE SIGHTPATH (MISCELLANEOUS) ×1
CLOTH BEACON ORANGE TIMEOUT ST (SAFETY) ×1 IMPLANT
DRSG TEGADERM 4X4.75 (GAUZE/BANDAGES/DRESSINGS) ×1 IMPLANT
EYE SHIELD UNIVERSAL CLEAR (GAUZE/BANDAGES/DRESSINGS) IMPLANT
FEE CATARACT SUITE SIGHTPATH (MISCELLANEOUS) ×1 IMPLANT
GLOVE BIOGEL PI IND STRL 7.0 (GLOVE) ×2 IMPLANT
LENS IOL TECNIS EYHANCE 19.5 (Intraocular Lens) IMPLANT
NDL HYPO 18GX1.5 BLUNT FILL (NEEDLE) ×1 IMPLANT
NEEDLE HYPO 18GX1.5 BLUNT FILL (NEEDLE) ×1
PAD ARMBOARD 7.5X6 YLW CONV (MISCELLANEOUS) ×1 IMPLANT
POSITIONER HEAD 8X9X4 ADT (SOFTGOODS) ×1 IMPLANT
SYR TB 1ML LL NO SAFETY (SYRINGE) ×1 IMPLANT
TAPE SURG TRANSPORE 1 IN (GAUZE/BANDAGES/DRESSINGS) IMPLANT
WATER STERILE IRR 250ML POUR (IV SOLUTION) ×1 IMPLANT

## 2023-04-04 NOTE — Anesthesia Preprocedure Evaluation (Signed)
Anesthesia Evaluation  Patient identified by MRN, date of birth, ID band Patient awake    Reviewed: Allergy & Precautions, H&P , NPO status , Patient's Chart, lab work & pertinent test results  Airway Mallampati: II  TM Distance: >3 FB Neck ROM: Full    Dental  (+) Upper Dentures, Lower Dentures   Pulmonary neg pulmonary ROS   Pulmonary exam normal breath sounds clear to auscultation       Cardiovascular negative cardio ROS Normal cardiovascular exam Rhythm:Regular Rate:Normal     Neuro/Psych  Neuromuscular disease  negative psych ROS   GI/Hepatic Neg liver ROS, PUD,GERD  Medicated and Controlled,,  Endo/Other  negative endocrine ROS    Renal/GU negative Renal ROS Bladder dysfunction (prostate cancer)      Musculoskeletal  (+) Arthritis , Osteoarthritis,    Abdominal   Peds negative pediatric ROS (+)  Hematology negative hematology ROS (+)   Anesthesia Other Findings   Reproductive/Obstetrics negative OB ROS                             Anesthesia Physical Anesthesia Plan  ASA: 3  Anesthesia Plan: MAC   Post-op Pain Management: Minimal or no pain anticipated   Induction: Intravenous  PONV Risk Score and Plan: 0 and Treatment may vary due to age or medical condition  Airway Management Planned: Nasal Cannula and Natural Airway  Additional Equipment:   Intra-op Plan:   Post-operative Plan:   Informed Consent: I have reviewed the patients History and Physical, chart, labs and discussed the procedure including the risks, benefits and alternatives for the proposed anesthesia with the patient or authorized representative who has indicated his/her understanding and acceptance.     Dental advisory given  Plan Discussed with: CRNA and Surgeon  Anesthesia Plan Comments:        Anesthesia Quick Evaluation

## 2023-04-04 NOTE — Anesthesia Postprocedure Evaluation (Signed)
Anesthesia Post Note  Patient: Gregory Pearson  Procedure(s) Performed: CATARACT EXTRACTION PHACO AND INTRAOCULAR LENS PLACEMENT (IOC) (Left: Eye)  Patient location during evaluation: Phase II Anesthesia Type: MAC Level of consciousness: awake and alert and oriented Pain management: pain level controlled Vital Signs Assessment: post-procedure vital signs reviewed and stable Respiratory status: spontaneous breathing, nonlabored ventilation and respiratory function stable Cardiovascular status: blood pressure returned to baseline and stable Postop Assessment: no apparent nausea or vomiting Anesthetic complications: no  No notable events documented.   Last Vitals:  Vitals:   04/04/23 1159 04/04/23 1332  BP: 124/84 135/83  Pulse: 73 67  Resp: 14 14  Temp: 36.6 C 36.5 C  SpO2: 100% 98%    Last Pain:  Vitals:   04/04/23 1332  TempSrc: Oral  PainSc: 0-No pain                  C 

## 2023-04-04 NOTE — Anesthesia Procedure Notes (Signed)
Date/Time: 04/04/2023 1:09 PM  Performed by: Franco Nones, CRNAPre-anesthesia Checklist: Patient identified, Emergency Drugs available, Suction available, Timeout performed and Patient being monitored Patient Re-evaluated:Patient Re-evaluated prior to induction Oxygen Delivery Method: Nasal Cannula

## 2023-04-04 NOTE — Op Note (Signed)
Date of procedure: 04/04/23  Pre-operative diagnosis: Visually significant age-related nuclear cataract, Left Eye (H25.12)  Post-operative diagnosis: Visually significant age-related nuclear cataract, Left Eye  Procedure: Removal of cataract via phacoemulsification and insertion of intra-ocular lens J&J DIB00 +19.5D into the capsular bag of the Left Eye  Attending surgeon: Ronal Fear, MD  Anesthesia: MAC, Topical Akten  Complications: None  Estimated Blood Loss: <30mL (minimal)  Specimens: None  Implants:  Implant Name Type Inv. Item Serial No. Manufacturer Lot No. LRB No. Used Action  LENS IOL TECNIS EYHANCE 19.5 - [id number] Intraocular Lens LENS IOL TECNIS EYHANCE 19.5 [id number] SIGHTPATH  Left 1 Implanted    Indications:  Visually significant age-related cataract, Left Eye  Procedure:  The patient was seen and identified in the pre-operative area. The operative eye was identified and dilated.  The operative eye was marked.  Topical anesthesia was administered to the operative eye.     The patient was then to the operative suite and placed in the supine position.  A timeout was performed confirming the patient, procedure to be performed, and all other relevant information.   The patient's face was prepped and draped in the usual fashion for intra-ocular surgery.  A lid speculum was placed into the operative eye and the surgical microscope moved into place and focused.  An inferotemporal paracentesis was created using a 20 gauge paracentesis blade.  BSS mixed with Omidria, followed by 1% lidocaine was injected into the anterior chamber.  Viscoelastic was injected into the anterior chamber.  A temporal clear-corneal main wound incision was created using a 2.57mm microkeratome.  A continuous curvilinear capsulorrhexis was initiated using an irrigating cystitome and completed using capsulorrhexis forceps.  Hydrodissection and hydrodeliniation were performed.  Viscoelastic was  injected into the anterior chamber.  A phacoemulsification handpiece and a chopper as a second instrument were used to remove the nucleus and epinucleus. The irrigation/aspiration handpiece was used to remove any remaining cortical material.   The capsular bag was reinflated with viscoelastic, checked, and found to be intact.  The intraocular lens was inserted into the capsular bag.  The irrigation/aspiration handpiece was used to remove any remaining viscoelastic.  The clear corneal wound and paracentesis wounds were then hydrated and checked with Weck-Cels to be watertight.  The lid-speculum and drape was removed, and the patient's face was cleaned with a wet and dry 4x4.  Maxitrol drops were instilled onto the eye. A clear shield was taped over the eye. The patient was taken to the post-operative care unit in good condition, having tolerated the procedure well.  Post-Op Instructions: The patient will follow up at [facility] for a same day post-operative evaluation and will receive all other orders and instructions.

## 2023-04-04 NOTE — Discharge Instructions (Signed)
Please discharge patient when stable, will follow up today with Dr. Snyder at the [facility] [place] office immediately following discharge.  Leave shield in place until visit.  All paperwork with discharge instructions will be given at the office.  [facility] Address:  [address]  [place], [place] [address]  Dr. Snyder's Phone: [phone]  

## 2023-04-04 NOTE — Interval H&P Note (Signed)
History and Physical Interval Note:  04/04/2023 12:27 PM  The H and P was reviewed and updated. The patient was examined.  No changes were found after exam.  The surgical eye was marked.   

## 2023-04-04 NOTE — Transfer of Care (Signed)
Immediate Anesthesia Transfer of Care Note  Patient: Kaide Sliwa  Procedure(s) Performed: CATARACT EXTRACTION PHACO AND INTRAOCULAR LENS PLACEMENT (IOC) (Left: Eye)  Patient Location: Short stay  Anesthesia Type:MAC  Level of Consciousness: awake and patient cooperative  Airway & Oxygen Therapy: Patient Spontanous Breathing  Post-op Assessment: Report given to RN and Post -op Vital signs reviewed and stable  Post vital signs: Reviewed and stable  Last Vitals:  Vitals Value Taken Time  BP 135/83 04/04/23 1332  Temp 36.5 C 04/04/23 1332  Pulse 67 04/04/23 1332  Resp 14 04/04/23 1332  SpO2 98 % 04/04/23 1332    Last Pain:  Vitals:   04/04/23 1332  TempSrc: Oral  PainSc: 0-No pain         Complications: No notable events documented.

## 2023-04-07 ENCOUNTER — Encounter (HOSPITAL_COMMUNITY): Payer: Self-pay | Admitting: Optometry

## 2023-04-07 ENCOUNTER — Ambulatory Visit (INDEPENDENT_AMBULATORY_CARE_PROVIDER_SITE_OTHER): Payer: Medicare Other | Admitting: Family Medicine

## 2023-04-07 VITALS — BP 130/85 | HR 84 | Temp 97.4°F | Ht 72.0 in | Wt 212.8 lb

## 2023-04-07 DIAGNOSIS — R011 Cardiac murmur, unspecified: Secondary | ICD-10-CM | POA: Insufficient documentation

## 2023-04-07 DIAGNOSIS — E782 Mixed hyperlipidemia: Secondary | ICD-10-CM

## 2023-04-07 DIAGNOSIS — D696 Thrombocytopenia, unspecified: Secondary | ICD-10-CM

## 2023-04-07 NOTE — Progress Notes (Signed)
BP 130/85   Pulse 84   Temp (!) 97.4 F (36.3 C) (Oral)   Ht 6' (1.829 m)   Wt 212 lb 12.8 oz (96.5 kg)   SpO2 98%   BMI 28.86 kg/m    Subjective:   Patient ID: Gregory Pearson, male    DOB: 03-23-1928, 89 y.o.   MRN: [id number]  HPI: Gregory Pearson is a 89 y.o. male presenting on 04/07/2023 for Hyperlipidemia, Hypertension, and Medical Management of Chronic Issues   HPI Hypertension Patient is currently on no medicine currently, and their blood pressure today is 130/85. Patient denies any lightheadedness or dizziness. Patient denies headaches, blurred vision, chest pains, shortness of breath, or weakness. Denies any side effects from medication and is content with current medication.   Hyperlipidemia Patient is coming in for recheck of his hyperlipidemia. The patient is currently taking no medicine currently, diet control. They deny any issues with myalgias or history of liver damage from it. They deny any focal numbness or weakness or chest pain.   Patient has had a systolic murmur since birth, it has been a while since he has had a repeat echo that I can tell so I will order 1.  Thrombocytopenia recheck Patient has a history of thrombocytopenia on his labs, will recheck today.    Patient has a history of GI bleed and anemia and denies any issues but will recheck today.  Relevant past medical, surgical, family and social history reviewed and updated as indicated. Interim medical history since our last visit reviewed. Allergies and medications reviewed and updated.  Review of Systems  Constitutional:  Negative for chills and fever.  Eyes:  Negative for visual disturbance.  Respiratory:  Negative for shortness of breath and wheezing.   Cardiovascular:  Negative for chest pain and leg swelling.  Musculoskeletal:  Negative for back pain and gait problem.  Skin:  Negative for rash.  Neurological:  Negative for dizziness, weakness and light-headedness.  All other systems reviewed  and are negative.   Per HPI unless specifically indicated above   Allergies as of 04/07/2023   No Known Allergies      Medication List        Accurate as of April 07, 2023  2:14 PM. If you have any questions, ask your nurse or doctor.          STOP taking these medications    azithromycin 250 MG tablet Commonly known as: Zithromax Z-Pak Stopped by: Elige Radon        TAKE these medications    acetaminophen 500 MG tablet Commonly known as: TYLENOL Take 1 tablet (500 mg total) by mouth every 6 (six) hours as needed. What changed: reasons to take this   multivitamin tablet Take 1 tablet by mouth daily.   mupirocin ointment 2 % Commonly known as: BACTROBAN Apply 1 Application topically 2 (two) times daily.   Myrbetriq 50 MG Tb24 tablet Generic drug: mirabegron ER Take 50 mg by mouth daily.   nabumetone 500 MG tablet Commonly known as: RELAFEN TAKE 2 TABLETS BY MOUTH TWICE  DAILY FOR MUSCLE AND JOINT PAIN   pantoprazole 40 MG tablet Commonly known as: PROTONIX Take 1 tablet (40 mg total) by mouth daily.   traZODone 50 MG tablet Commonly known as: DESYREL Take 0.5-1 tablets (25-50 mg total) by mouth at bedtime as needed for sleep.         Objective:   BP 130/85   Pulse 84   Temp (!) 97.4 F (  36.3 C) (Oral)   Ht 6' (1.829 m)   Wt 212 lb 12.8 oz (96.5 kg)   SpO2 98%   BMI 28.86 kg/m   Wt Readings from Last 3 Encounters:  04/07/23 212 lb 12.8 oz (96.5 kg)  04/04/23 200 lb (90.7 kg)  04/01/23 200 lb (90.7 kg)    Physical Exam Vitals and nursing note reviewed.  Constitutional:      General: He is not in acute distress.    Appearance: He is well-developed. He is not diaphoretic.  Eyes:     General: No scleral icterus.       Right eye: No discharge.     Conjunctiva/sclera: Conjunctivae normal.     Pupils: Pupils are equal, round, and reactive to light.  Neck:     Thyroid: No thyromegaly.  Cardiovascular:     Rate and Rhythm:  Normal rate and regular rhythm.     Heart sounds: Murmur (4 out of 6 systolic murmur best heard at second intercostal space) heard.  Pulmonary:     Effort: Pulmonary effort is normal. No respiratory distress.     Breath sounds: Normal breath sounds. No wheezing.  Musculoskeletal:        General: Normal range of motion.     Cervical back: Neck supple.  Lymphadenopathy:     Cervical: No cervical adenopathy.  Skin:    General: Skin is warm and dry.     Findings: No rash.  Neurological:     Mental Status: He is alert and oriented to person, place, and time.     Coordination: Coordination normal.  Psychiatric:        Behavior: Behavior normal.       Assessment & Plan:   Problem List Items Addressed This Visit       Hematopoietic and Hemostatic   Thrombocytopenia (HCC)   Relevant Orders   CBC with Differential/Platelet   CMP14+EGFR   Lipid panel     Other   Hyperlipidemia - Primary   Relevant Orders   CBC with Differential/Platelet   CMP14+EGFR   Lipid panel   Systolic murmur   Relevant Orders   ECHOCARDIOGRAM COMPLETE    Will repeat echocardiogram follow-up in a little while since he had 1.  Do blood work today.  Otherwise it seems like he is doing well. Follow up plan: Return in about 6 months (around 10/08/2023), or if symptoms worsen or fail to improve.  Counseling provided for all of the vaccine components Orders Placed This Encounter  Procedures   CBC with Differential/Platelet   CMP14+EGFR   Lipid panel   ECHOCARDIOGRAM COMPLETE    Arville Care, MD [facility] 04/07/2023, 2:14 PM

## 2023-04-11 NOTE — H&P (Signed)
Surgical History & Physical  Patient Name: Gregory Pearson  DOB: 1928/06/07  Surgery: Cataract extraction with intraocular lens implant phacoemulsification; Right Eye Surgeon: Pecolia Ades MD Surgery Date: 04/18/2023 Pre-Op Date: 04/09/2023  HPI: A 79 Yr. old male patient presents for a 5 day CEIOL PO OS and CEIOL pre OP OD. Patient reports that he has been compliant with use of sx gtts as directed. Patient reports FBS occasionally, denies use of AT. Patient reports that he has difficulty recognizing people from afar with OD. Patient states that he is bothered by glare both day and night. Patient states that his vision is blurred and hazy OD. Patient elects to proceed with cataract sx OS.  Medical History:  Arthritis Cancer kidney problems  Review of Systems Negative Allergic/Immunologic Negative Cardiovascular Negative Constitutional Negative Ear, Nose, Mouth & Throat Negative Endocrine Negative Eyes Negative Gastrointestinal Negative Genitourinary Negative Hematologic/Lymphatic Negative Integumentary Musculoskeletal Arthritis  Negative Neurological Negative Psychiatry Negative Respiratory  Social Never Smoked  Medication Prednisolone-moxiflox-bromfen,  Myrbetriq, Nabumetone, Trazodone  Sx/Procedures Phaco c IOL OS,  Appendectomy, Gallbladder Removal, Hemmoroid removed, Sx on leg- Lt  Drug Allergies  NKDA  History & Physical: Heent: cataract OD NECK: supple without bruits LUNGS: lungs clear to auscultation CV: regular rate and rhythm Abdomen: soft and non-tender  Impression & Plan: Assessment: 1.  CATARACT EXTRACTION STATUS; Left Eye (Z98.42) 2.  INTRAOCULAR LENS IOL (Z96.1)  Plan: 1.  POD#5 Continue with eye drops as directed. Reviewed all post-op precautions. Call with increased redness, swelling, pain or loss of vision. Avoid swimming pools and hot tubs for 1 week.  2.  Doing well since surgery

## 2023-04-14 ENCOUNTER — Encounter (HOSPITAL_COMMUNITY)
Admission: RE | Admit: 2023-04-14 | Discharge: 2023-04-14 | Disposition: A | Discharge status: Home / Self Care | Payer: Medicare Other | Source: Ambulatory Visit | Attending: Optometry | Admitting: Optometry

## 2023-04-14 ENCOUNTER — Encounter (HOSPITAL_COMMUNITY): Payer: Self-pay

## 2023-04-18 ENCOUNTER — Encounter (HOSPITAL_COMMUNITY)
Admission: RE | Disposition: A | Discharge status: Home / Self Care | Payer: Self-pay | Source: Home / Self Care | Attending: Optometry

## 2023-04-18 ENCOUNTER — Ambulatory Visit (HOSPITAL_COMMUNITY)
Admission: RE | Admit: 2023-04-18 | Discharge: 2023-04-18 | Disposition: A | Discharge status: Home / Self Care | Payer: Medicare Other | Attending: Optometry | Admitting: Optometry

## 2023-04-18 ENCOUNTER — Ambulatory Visit (HOSPITAL_COMMUNITY): Payer: Medicare Other | Admitting: Certified Registered"

## 2023-04-18 ENCOUNTER — Ambulatory Visit (HOSPITAL_BASED_OUTPATIENT_CLINIC_OR_DEPARTMENT_OTHER): Payer: Medicare Other | Admitting: Certified Registered"

## 2023-04-18 DIAGNOSIS — M1990 Unspecified osteoarthritis, unspecified site: Secondary | ICD-10-CM | POA: Diagnosis not present

## 2023-04-18 DIAGNOSIS — K219 Gastro-esophageal reflux disease without esophagitis: Secondary | ICD-10-CM | POA: Diagnosis not present

## 2023-04-18 DIAGNOSIS — Z8546 Personal history of malignant neoplasm of prostate: Secondary | ICD-10-CM | POA: Insufficient documentation

## 2023-04-18 DIAGNOSIS — H2511 Age-related nuclear cataract, right eye: Secondary | ICD-10-CM

## 2023-04-18 DIAGNOSIS — E785 Hyperlipidemia, unspecified: Secondary | ICD-10-CM

## 2023-04-18 DIAGNOSIS — Z79899 Other long term (current) drug therapy: Secondary | ICD-10-CM | POA: Diagnosis not present

## 2023-04-18 HISTORY — PX: CATARACT EXTRACTION W/PHACO: SHX586

## 2023-04-18 SURGERY — PHACOEMULSIFICATION, CATARACT, WITH IOL INSERTION
Anesthesia: Monitor Anesthesia Care | Site: Eye | Laterality: Right

## 2023-04-18 MED ORDER — PHENYLEPHRINE-KETOROLAC 1-0.3 % IO SOLN
INTRAOCULAR | Status: DC | PRN
  Administered 2023-04-18: 500 mL via OPHTHALMIC

## 2023-04-18 MED ORDER — PHENYLEPHRINE HCL 2.5 % OP SOLN
1.0000 [drp] | OPHTHALMIC | Status: AC
  Administered 2023-04-18 (×3): 1 [drp] via OPHTHALMIC

## 2023-04-18 MED ORDER — POVIDONE-IODINE 5 % OP SOLN
OPHTHALMIC | Status: DC | PRN
  Administered 2023-04-18: 1 via OPHTHALMIC

## 2023-04-18 MED ORDER — TETRACAINE HCL 0.5 % OP SOLN
1.0000 [drp] | OPHTHALMIC | Status: AC
  Administered 2023-04-18 (×3): 1 [drp] via OPHTHALMIC

## 2023-04-18 MED ORDER — TROPICAMIDE 1 % OP SOLN
1.0000 [drp] | OPHTHALMIC | Status: AC
  Administered 2023-04-18 (×3): 1 [drp] via OPHTHALMIC

## 2023-04-18 MED ORDER — LIDOCAINE HCL (PF) 1 % IJ SOLN
INTRAMUSCULAR | Status: DC | PRN
  Administered 2023-04-18: 1 mL

## 2023-04-18 MED ORDER — LIDOCAINE HCL 3.5 % OP GEL
1.0000 | Freq: Once | OPHTHALMIC | Status: AC
  Administered 2023-04-18: 1 via OPHTHALMIC

## 2023-04-18 MED ORDER — BSS IO SOLN
INTRAOCULAR | Status: DC | PRN
  Administered 2023-04-18: 15 mL via INTRAOCULAR

## 2023-04-18 MED ORDER — MOXIFLOXACIN 0.1% OPHTHALMIC SOLUTION (0.1ML)
INJECTION | OPHTHALMIC | Status: DC | PRN
  Administered 2023-04-18: 1 mL via OPHTHALMIC

## 2023-04-18 MED ORDER — SIGHTPATH DOSE#1 NA HYALUR & NA CHOND-NA HYALUR IO KIT
PACK | INTRAOCULAR | Status: DC | PRN
  Administered 2023-04-18: 1 via OPHTHALMIC

## 2023-04-18 MED ORDER — STERILE WATER FOR IRRIGATION IR SOLN
Status: DC | PRN
  Administered 2023-04-18: 1

## 2023-04-18 SURGICAL SUPPLY — 14 items
CATARACT SUITE SIGHTPATH (MISCELLANEOUS) ×1
CLOTH BEACON ORANGE TIMEOUT ST (SAFETY) ×1 IMPLANT
DRSG TEGADERM 4X4.75 (GAUZE/BANDAGES/DRESSINGS) ×1 IMPLANT
EYE SHIELD UNIVERSAL CLEAR (GAUZE/BANDAGES/DRESSINGS) IMPLANT
FEE CATARACT SUITE SIGHTPATH (MISCELLANEOUS) ×1 IMPLANT
GLOVE BIOGEL PI IND STRL 7.0 (GLOVE) ×2 IMPLANT
LENS IOL TECNIS EYHANCE 18.5 (Intraocular Lens) IMPLANT
NDL HYPO 18GX1.5 BLUNT FILL (NEEDLE) ×1 IMPLANT
NEEDLE HYPO 18GX1.5 BLUNT FILL (NEEDLE) ×1
PAD ARMBOARD 7.5X6 YLW CONV (MISCELLANEOUS) ×1 IMPLANT
POSITIONER HEAD 8X9X4 ADT (SOFTGOODS) ×1 IMPLANT
SYR TB 1ML LL NO SAFETY (SYRINGE) ×1 IMPLANT
TAPE SURG TRANSPORE 1 IN (GAUZE/BANDAGES/DRESSINGS) IMPLANT
WATER STERILE IRR 250ML POUR (IV SOLUTION) ×1 IMPLANT

## 2023-04-18 NOTE — Transfer of Care (Signed)
Immediate Anesthesia Transfer of Care Note  Patient: Gregory Pearson  Procedure(s) Performed: CATARACT EXTRACTION PHACO AND INTRAOCULAR LENS PLACEMENT (IOC) (Right: Eye)  Patient Location: Short Stay  Anesthesia Type:MAC  Level of Consciousness: awake, alert , and patient cooperative  Airway & Oxygen Therapy: Patient Spontanous Breathing  Post-op Assessment: Report given to RN and Post -op Vital signs reviewed and stable  Post vital signs: Reviewed and stable  Last Vitals:  Vitals Value Taken Time  BP 139/85 04/18/23 1017  Temp 36.4 C 04/18/23 1017  Pulse 64 04/18/23 1017  Resp 14 04/18/23 1017  SpO2 100 % 04/18/23 1017    Last Pain:  Vitals:   04/18/23 1017  TempSrc: Oral  PainSc: 0-No pain         Complications: No notable events documented.

## 2023-04-18 NOTE — Anesthesia Preprocedure Evaluation (Addendum)
Anesthesia Evaluation  Patient identified by MRN, date of birth, ID band Patient awake    Reviewed: Allergy & Precautions, H&P , NPO status , Patient's Chart, lab work & pertinent test results  Airway Mallampati: II  TM Distance: >3 FB Neck ROM: Full    Dental  (+) Upper Dentures, Lower Dentures   Pulmonary neg pulmonary ROS   Pulmonary exam normal breath sounds clear to auscultation       Cardiovascular negative cardio ROS Normal cardiovascular exam Rhythm:Regular Rate:Normal     Neuro/Psych  Neuromuscular disease  negative psych ROS   GI/Hepatic Neg liver ROS, PUD,GERD  Medicated and Controlled,,  Endo/Other  negative endocrine ROS    Renal/GU negative Renal ROS Bladder dysfunction (prostate cancer)      Musculoskeletal  (+) Arthritis , Osteoarthritis,    Abdominal   Peds negative pediatric ROS (+)  Hematology negative hematology ROS (+)   Anesthesia Other Findings   Reproductive/Obstetrics negative OB ROS                             Anesthesia Physical Anesthesia Plan  ASA: 3  Anesthesia Plan: MAC   Post-op Pain Management: Minimal or no pain anticipated   Induction: Intravenous  PONV Risk Score and Plan: 0 and Treatment may vary due to age or medical condition  Airway Management Planned: Nasal Cannula and Natural Airway  Additional Equipment:   Intra-op Plan:   Post-operative Plan:   Informed Consent: I have reviewed the patients History and Physical, chart, labs and discussed the procedure including the risks, benefits and alternatives for the proposed anesthesia with the patient or authorized representative who has indicated his/her understanding and acceptance.     Dental advisory given  Plan Discussed with: CRNA and Surgeon  Anesthesia Plan Comments:        Anesthesia Quick Evaluation                                  Anesthesia Evaluation  Patient  identified by MRN, date of birth, ID band Patient awake    Reviewed: Allergy & Precautions, H&P , NPO status , Patient's Chart, lab work & pertinent test results, reviewed documented beta blocker date and time   Airway Mallampati: II  TM Distance: >3 FB Neck ROM: full    Dental no notable dental hx.    Pulmonary neg pulmonary ROS   Pulmonary exam normal breath sounds clear to auscultation       Cardiovascular Exercise Tolerance: Good negative cardio ROS + Valvular Problems/Murmurs  Rhythm:regular Rate:Normal     Neuro/Psych  Neuromuscular disease negative neurological ROS  negative psych ROS   GI/Hepatic negative GI ROS, Neg liver ROS, PUD,GERD  ,,  Endo/Other  negative endocrine ROS    Renal/GU negative Renal ROS  negative genitourinary   Musculoskeletal   Abdominal   Peds  Hematology negative hematology ROS (+)   Anesthesia Other Findings   Reproductive/Obstetrics negative OB ROS                             Anesthesia Physical Anesthesia Plan  ASA: 3  Anesthesia Plan: General   Post-op Pain Management:    Induction:   PONV Risk Score and Plan:   Airway Management Planned:   Additional Equipment:   Intra-op Plan:   Post-operative Plan:  Informed Consent: I have reviewed the patients History and Physical, chart, labs and discussed the procedure including the risks, benefits and alternatives for the proposed anesthesia with the patient or authorized representative who has indicated his/her understanding and acceptance.     Dental Advisory Given  Plan Discussed with: CRNA  Anesthesia Plan Comments:        Anesthesia Quick Evaluation

## 2023-04-18 NOTE — Discharge Instructions (Signed)
Please discharge patient when stable, will follow up today with Dr. Snyder at the [facility] [place] office immediately following discharge.  Leave shield in place until visit.  All paperwork with discharge instructions will be given at the office.  [facility] Address:  [address]  [place], [place] [address]  Dr. Snyder's Phone: [phone]  

## 2023-04-18 NOTE — Interval H&P Note (Signed)
History and Physical Interval Note:  04/18/2023 9:23 AM  The H and P was reviewed and updated. The patient was examined.  No changes were found after exam.  The surgical eye was marked.  Gregory Pearson

## 2023-04-18 NOTE — Op Note (Signed)
Date of procedure: 04/18/23  Pre-operative diagnosis: Visually significant age-related nuclear cataract, Right Eye (H25.11)  Post-operative diagnosis: Visually significant age-related nuclear cataract, Right Eye  Procedure: Removal of cataract via phacoemulsification and insertion of intra-ocular lens J&J DIBOO +18.5D into the capsular bag of the Right Eye  Attending surgeon: Pecolia Ades, MD  Anesthesia: MAC, Topical Akten  Complications: None  Estimated Blood Loss: <13mL (minimal)  Specimens: None  Implants:  Implant Name Type Inv. Item Serial No. Manufacturer Lot No. LRB No. Used Action  LENS IOL TECNIS EYHANCE 18.5 - [id number] Intraocular Lens LENS IOL TECNIS EYHANCE 18.5 [id number] [employer]  Right 1 Implanted    Indications:  Visually significant age-related cataract, Right Eye  Procedure:  The patient was seen and identified in the pre-operative area. The operative eye was identified and dilated.  The operative eye was marked.  Topical anesthesia was administered to the operative eye.     The patient was then to the operative suite and placed in the supine position.  A timeout was performed confirming the patient, procedure to be performed, and all other relevant information.   The patient's face was prepped and draped in the usual fashion for intra-ocular surgery.  A lid speculum was placed into the operative eye and the surgical microscope moved into place and focused.  A superotemporal paracentesis was created using a 20 gauge paracentesis blade.  BSS mixed with Omidria, followed by 1% lidocaine was injected into the anterior chamber.  Viscoelastic was injected into the anterior chamber.  A temporal clear-corneal main wound incision was created using a 2.8mm microkeratome.  A continuous curvilinear capsulorrhexis was initiated using an irrigating cystitome and completed using capsulorrhexis forceps.  Hydrodissection and hydrodeliniation were performed.  Viscoelastic was  injected into the anterior chamber.  A phacoemulsification handpiece and a chopper as a second instrument were used to remove the nucleus and epinucleus. The irrigation/aspiration handpiece was used to remove any remaining cortical material.   The capsular bag was reinflated with viscoelastic, checked, and found to be intact.  The intraocular lens was inserted into the capsular bag.  The irrigation/aspiration handpiece was used to remove any remaining viscoelastic.  The clear corneal wound and paracentesis wounds were then hydrated and checked with Weck-Cels to be watertight.  The lid-speculum and drape was removed, and the patient's face was cleaned with a wet and dry 4x4.  Maxitrol drops were instilled onto the eye. A clear shield was taped over the eye. The patient was taken to the post-operative care unit in good condition, having tolerated the procedure well.  Post-Op Instructions: The patient will follow up at [facility] for a same day post-operative evaluation and will receive all other orders and instructions.

## 2023-04-18 NOTE — Anesthesia Postprocedure Evaluation (Signed)
Anesthesia Post Note  Patient: Gregory Pearson  Procedure(s) Performed: CATARACT EXTRACTION PHACO AND INTRAOCULAR LENS PLACEMENT (IOC) (Right: Eye)  Patient location during evaluation: Phase II Anesthesia Type: General Level of consciousness: awake Pain management: pain level controlled Vital Signs Assessment: post-procedure vital signs reviewed and stable Respiratory status: spontaneous breathing and respiratory function stable Cardiovascular status: blood pressure returned to baseline and stable Postop Assessment: no headache and no apparent nausea or vomiting Anesthetic complications: no Comments: Late entry   No notable events documented.   Last Vitals:  Vitals:   04/18/23 0915 04/18/23 1017  BP:  139/85  Pulse: 62 64  Resp: 13 14  Temp:  36.4 C  SpO2: 98% 100%    Last Pain:  Vitals:   04/18/23 1017  TempSrc: Oral  PainSc: 0-No pain                 Windell Norfolk

## 2023-04-21 ENCOUNTER — Encounter (HOSPITAL_COMMUNITY): Payer: Self-pay | Admitting: Optometry

## 2023-04-25 ENCOUNTER — Encounter: Payer: Self-pay | Admitting: Family Medicine

## 2023-05-23 ENCOUNTER — Ambulatory Visit (HOSPITAL_COMMUNITY)
Admission: RE | Admit: 2023-05-23 | Discharge: 2023-05-23 | Disposition: A | Discharge status: Home / Self Care | Payer: Medicare Other | Source: Ambulatory Visit | Attending: Family Medicine | Admitting: Family Medicine

## 2023-05-23 DIAGNOSIS — R011 Cardiac murmur, unspecified: Secondary | ICD-10-CM | POA: Diagnosis present

## 2023-05-23 LAB — ECHOCARDIOGRAM COMPLETE
AR max vel: 0.9 cm2
AV Area VTI: 0.93 cm2
AV Area mean vel: 0.9 cm2
AV Mean grad: 25 mm[Hg]
AV Peak grad: 45.4 mm[Hg]
Ao pk vel: 3.37 m/s
Area-P 1/2: 2.76 cm2
MV VTI: 2.1 cm2
S' Lateral: 2.6 cm

## 2023-05-23 NOTE — Progress Notes (Signed)
*  PRELIMINARY RESULTS* Echocardiogram 2D Echocardiogram has been performed.  Gregory Pearson 05/23/2023, 10:39 AM

## 2023-07-01 ENCOUNTER — Other Ambulatory Visit: Payer: Self-pay | Admitting: Family Medicine

## 2023-07-01 DIAGNOSIS — F5104 Psychophysiologic insomnia: Secondary | ICD-10-CM

## 2023-08-08 ENCOUNTER — Ambulatory Visit (INDEPENDENT_AMBULATORY_CARE_PROVIDER_SITE_OTHER): Payer: Medicare Other | Admitting: Family Medicine

## 2023-08-08 ENCOUNTER — Encounter: Payer: Self-pay | Admitting: Family Medicine

## 2023-08-08 VITALS — BP 139/82 | HR 71 | Temp 98.0°F | Ht 72.0 in | Wt 211.0 lb

## 2023-08-08 DIAGNOSIS — B372 Candidiasis of skin and nail: Secondary | ICD-10-CM | POA: Diagnosis not present

## 2023-08-08 DIAGNOSIS — R600 Localized edema: Secondary | ICD-10-CM | POA: Diagnosis not present

## 2023-08-08 MED ORDER — MUPIROCIN 2 % EX OINT
1.0000 | TOPICAL_OINTMENT | Freq: Two times a day (BID) | CUTANEOUS | 1 refills | Status: DC
Start: 2023-08-08 — End: 2024-04-12

## 2023-08-08 MED ORDER — NYSTATIN 100000 UNIT/GM EX CREA
1.0000 | TOPICAL_CREAM | Freq: Two times a day (BID) | CUTANEOUS | 1 refills | Status: AC | PRN
Start: 2023-08-08 — End: 2023-08-22

## 2023-08-08 NOTE — Progress Notes (Signed)
BP 139/82   Pulse 71   Temp 98 F (36.7 C)   Ht 6' (1.829 m)   Wt 211 lb (95.7 kg)   BMI 28.62 kg/m    Subjective:   Patient ID: Gregory Pearson, male    DOB: 1928-04-28, 89 y.o.   MRN: [id number]  HPI: Lum Pfab is a 89 y.o. male presenting on 08/08/2023 for No chief complaint on file.   HPI Rash Patient is coming in today with complaints of a rash on his right lower abdomen.  He says he has an incision from appendicitis surgery in the past but then the folds of his skin will collect moisture there and get an irritated itchy rash.  He has been using mupirocin cream on it and it does seem to get better and is something that he fights off and on.  He denies any rash anywhere else or fevers or chills.  Patient gets swelling in his feet and sometimes lower legs.  He does wear compression stockings but it is something that he finds this is a gets up in the morning because of the swelling and then he gets his compression stockings on they do help but it does go down at night with his feet propped up.  Relevant past medical, surgical, family and social history reviewed and updated as indicated. Interim medical history since our last visit reviewed. Allergies and medications reviewed and updated.  Review of Systems  Constitutional:  Negative for chills and fever.  Respiratory:  Negative for shortness of breath and wheezing.   Cardiovascular:  Positive for leg swelling. Negative for chest pain.  Skin:  Positive for color change and rash.  All other systems reviewed and are negative.   Per HPI unless specifically indicated above   Allergies as of 08/08/2023   No Known Allergies      Medication List        Accurate as of August 08, 2023 11:49 AM. If you have any questions, ask your nurse or doctor.          acetaminophen 500 MG tablet Commonly known as: TYLENOL Take 1 tablet (500 mg total) by mouth every 6 (six) hours as needed. What changed: reasons to take this    multivitamin tablet Take 1 tablet by mouth daily.   mupirocin ointment 2 % Commonly known as: BACTROBAN Apply 1 Application topically 2 (two) times daily.   Myrbetriq 50 MG Tb24 tablet Generic drug: mirabegron ER Take 50 mg by mouth daily.   nabumetone 500 MG tablet Commonly known as: RELAFEN TAKE 2 TABLETS BY MOUTH TWICE  DAILY FOR MUSCLE AND JOINT PAIN   nystatin cream Commonly known as: MYCOSTATIN Apply 1 Application topically 2 (two) times daily as needed for up to 14 days for dry skin (yeast derm). Started by: Elige Radon Hagan Vanauken   pantoprazole 40 MG tablet Commonly known as: PROTONIX Take 1 tablet (40 mg total) by mouth daily.   traZODone 50 MG tablet Commonly known as: DESYREL TAKE 1/2 TO 1 TABLET BY MOUTH AT BEDTIME AS NEEDED FOR SLEEP         Objective:   BP 139/82   Pulse 71   Temp 98 F (36.7 C)   Ht 6' (1.829 m)   Wt 211 lb (95.7 kg)   BMI 28.62 kg/m   Wt Readings from Last 3 Encounters:  08/08/23 211 lb (95.7 kg)  04/14/23 212 lb 12.8 oz (96.5 kg)  04/07/23 212 lb 12.8 oz (96.5 kg)  Physical Exam Vitals and nursing note reviewed.  Constitutional:      General: He is not in acute distress.    Appearance: He is well-developed. He is not diaphoretic.  Eyes:     General: No scleral icterus.    Conjunctiva/sclera: Conjunctivae normal.  Neck:     Thyroid: No thyromegaly.  Musculoskeletal:        General: Swelling (2+ edema bilateral lower extremities up to about mid shin.) present. Normal range of motion.  Skin:    General: Skin is warm and dry.     Findings: Rash (Slight erythema and pruritus around the right lower abdomen previous incision.  Incision is healed well, no induration or fluctuation.) present.  Neurological:     Mental Status: He is alert and oriented to person, place, and time.     Coordination: Coordination normal.  Psychiatric:        Behavior: Behavior normal.       Assessment & Plan:   Problem List Items Addressed  This Visit   None Visit Diagnoses       Yeast dermatitis    -  Primary   Relevant Medications   mupirocin ointment (BACTROBAN) 2 %   nystatin cream (MYCOSTATIN)     Peripheral edema           Continue with mupirocin as needed when he gets flares up or rash, also recommended nystatin because of likely needs.  Continue with conservative management for peripheral edema. Follow up plan: Return if symptoms worsen or fail to improve.  Counseling provided for all of the vaccine components No orders of the defined types were placed in this encounter.   Arville Care, MD [facility] 08/08/2023, 11:49 AM

## 2023-10-07 ENCOUNTER — Ambulatory Visit (INDEPENDENT_AMBULATORY_CARE_PROVIDER_SITE_OTHER): Payer: Medicare Other

## 2023-10-07 VITALS — Ht 72.0 in | Wt 211.0 lb

## 2023-10-07 DIAGNOSIS — Z0000 Encounter for general adult medical examination without abnormal findings: Secondary | ICD-10-CM | POA: Diagnosis not present

## 2023-10-07 NOTE — Patient Instructions (Addendum)
Mr. Kilgore , Thank you for taking time to come for your Medicare Wellness Visit. I appreciate your ongoing commitment to your health goals. Please review the following plan we discussed and let me know if I can assist you in the future.   Referrals/Orders/Follow-Ups/Clinician Recommendations: Aim for 30 minutes of exercise or brisk walking, 6-8 glasses of water, and 5 servings of fruits and vegetables each day.  This is a list of the screening recommended for you and due dates:  Health Maintenance  Topic Date Due   COVID-19 Vaccine (4 - 2024-25 season) 04/27/2023   Medicare Annual Wellness Visit  10/06/2024   DTaP/Tdap/Td vaccine (3 - Td or Tdap) 04/28/2029   Pneumonia Vaccine  Completed   Flu Shot  Completed   Zoster (Shingles) Vaccine  Completed   HPV Vaccine  Aged Out    Advanced directives: (ACP Link)Information on Advanced Care Planning can be found at [facility] [place] Advance Health Care Directives Advance Health Care Directives ([email or web address]   Next Medicare Annual Wellness Visit scheduled for next year: Yes

## 2023-10-07 NOTE — Progress Notes (Signed)
Subjective:   Gregory Pearson is a 89 y.o. male who presents for Medicare Annual/Subsequent preventive examination.  Visit Complete: Virtual I connected with  Gregory Pearson on 10/07/23 by a audio enabled telemedicine application and verified that I am speaking with the correct person using two identifiers.  Patient Location: Home  Provider Location: Home Office  This patient declined Interactive audio and video telecommunications. Therefore the visit was completed with audio only.  I discussed the limitations of evaluation and management by telemedicine. The patient expressed understanding and agreed to proceed.  Vital Signs: Because this visit was a virtual/telehealth visit, some criteria may be missing or patient reported. Any vitals not documented were not able to be obtained and vitals that have been documented are patient reported.  Cardiac Risk Factors include: advanced age (>65men, >36 women);male gender;dyslipidemia;sedentary lifestyle     Objective:    Today's Vitals   10/07/23 1419  Weight: 211 lb (95.7 kg)  Height: 6' (1.829 m)   Body mass index is 28.62 kg/m.     10/07/2023    2:24 PM 04/18/2023    9:01 AM 04/04/2023   12:04 PM 04/01/2023    3:48 PM 07/26/2022    1:17 PM 04/26/2022    4:21 AM 06/25/2021    2:14 PM  Advanced Directives  Does Patient Have a Medical Advance Directive? No Yes Yes Yes Yes Yes Yes  Type of Advance Directive  Living will Living will Living will Healthcare Power of [place];Living will Healthcare Power of [place];Living will Healthcare Power of [place];Living will  Does patient want to make changes to medical advance directive?  No - Patient declined    No - Patient declined   Copy of Healthcare Power of Attorney in Chart?     No - copy requested No - copy requested No - copy requested  Would patient like information on creating a medical advance directive? Yes (MAU/Ambulatory/Procedural Areas - Information given)          Current  Medications (verified) Outpatient Encounter Medications as of 10/07/2023  Medication Sig   acetaminophen (TYLENOL) 500 MG tablet Take 1 tablet (500 mg total) by mouth every 6 (six) hours as needed. (Patient taking differently: Take 500 mg by mouth every 6 (six) hours as needed for fever or mild pain (pain score 1-3).)   Multiple Vitamin (MULTIVITAMIN) tablet Take 1 tablet by mouth daily.   mupirocin ointment (BACTROBAN) 2 % Apply 1 Application topically 2 (two) times daily.   nabumetone (RELAFEN) 500 MG tablet TAKE 2 TABLETS BY MOUTH TWICE  DAILY FOR MUSCLE AND JOINT PAIN   traZODone (DESYREL) 50 MG tablet TAKE 1/2 TO 1 TABLET BY MOUTH AT BEDTIME AS NEEDED FOR SLEEP   mirabegron ER (MYRBETRIQ) 50 MG TB24 tablet Take 50 mg by mouth daily. (Patient not taking: Reported on 10/07/2023)   pantoprazole (PROTONIX) 40 MG tablet Take 1 tablet (40 mg total) by mouth daily. (Patient not taking: Reported on 10/07/2023)   No facility-administered encounter medications on file as of 10/07/2023.    Allergies (verified) Patient has no known allergies.   History: Past Medical History:  Diagnosis Date   Asbestos exposure    Blood transfusion without reported diagnosis    after ruptured appendix 1963   Diverticulosis    Gastric ulcer 06/2019   GERD (gastroesophageal reflux disease)    History of stomach ulcers 1995   H.pylori s/p treatment   History of stomach ulcers    Hyperlipidemia    Muscle strain of  right gluteal region 01/24/2014   Prostate cancer [facility]) 2002   prostatectomy   Traumatic hematoma of buttock 01/24/2014   Past Surgical History:  Procedure Laterality Date   APPENDECTOMY  1963   ruptured appendix/gangrene   BIOPSY  07/06/2019   Procedure: BIOPSY;  Surgeon: Corbin Ade, MD;  [facility];  Service: Endoscopy;;   CATARACT EXTRACTION W/PHACO Left 04/04/2023   Procedure: CATARACT EXTRACTION PHACO AND INTRAOCULAR LENS PLACEMENT (IOC);  Surgeon: Pecolia Ades, MD;   Location: [facility];  Service: Ophthalmology;  Laterality: Left;  CDE  5.20   CATARACT EXTRACTION W/PHACO Right 04/18/2023   Procedure: CATARACT EXTRACTION PHACO AND INTRAOCULAR LENS PLACEMENT (IOC);  Surgeon: Pecolia Ades, MD;  Location: [facility];  Service: Ophthalmology;  Laterality: Right;  CDE 12.07   COLONOSCOPY  2013   Dr. Russella Dar: multiple colon polyps (tubular adenomas), diverticulosis. no further surveillance colonoscopies due to age.   ESOPHAGOGASTRODUODENOSCOPY  [id number]   Dr. Russella Dar: gastric ulcer (h.pylori + s/p tx), follow up EGD verified ulcer healing   ESOPHAGOGASTRODUODENOSCOPY N/A 09/26/2016   Procedure: ESOPHAGOGASTRODUODENOSCOPY (EGD);  Surgeon: Corbin Ade, MD;  [facility];  Service: Endoscopy;  Laterality: N/A;   ESOPHAGOGASTRODUODENOSCOPY N/A 01/23/2017   Procedure: ESOPHAGOGASTRODUODENOSCOPY (EGD);  Surgeon: Corbin Ade, MD;  [facility];  Service: Endoscopy;  Laterality: N/A;  12:15pm   ESOPHAGOGASTRODUODENOSCOPY N/A 07/06/2019   Procedure: ESOPHAGOGASTRODUODENOSCOPY (EGD);  Surgeon: Corbin Ade, MD; normal esophagus, nonbleeding gastric ulcer with pigmented material, abnormal gastric mucosa of uncertain significance s/p biopsy, normal duodenum.  Pathology with mild reactive gastropathy, mild chronic gastritis, H. pylori negative.   ESOPHAGOGASTRODUODENOSCOPY N/A 10/20/2019   Procedure: ESOPHAGOGASTRODUODENOSCOPY (EGD);  Surgeon: Corbin Ade, MD;  [facility];  Service: Endoscopy;  Laterality: N/A;  9:30am   HEMORRHOID SURGERY  1970   LAPAROSCOPIC CHOLECYSTECTOMY  2003   PROSTATECTOMY  2002   Family History  Problem Relation Age of Onset   Prostate cancer Brother    Prostate cancer Brother    Suicidality Brother    Prostate cancer Brother    Stomach cancer Neg Hx    Colon cancer Neg Hx    Social History   Socioeconomic History   Marital status: Widowed    Spouse name: Not on file   Number of children: 3   Years of  education: Not on file   Highest education level: Not on file  Occupational History   Occupation: retired Psychiatric nurse power  Tobacco Use   Smoking status: Never   Smokeless tobacco: Never  Vaping Use   Vaping status: Never Used  Substance and Sexual Activity   Alcohol use: Not Currently    Comment: last drank about 15 years ago   Drug use: Never   Sexual activity: Not on file  Other Topics Concern   Not on file  Social History Narrative   3 children.   Widowed since 01/16/2021. Married x 61 years.    Social Drivers of Corporate investment banker Strain: Low Risk  (10/07/2023)   Overall Financial Resource Strain (CARDIA)    Difficulty of Paying Living Expenses: Not hard at all  Food Insecurity: No Food Insecurity (10/07/2023)   Hunger Vital Sign    Worried About Running Out of Food in the Last Year: Never true    Ran Out of Food in the Last Year: Never true  Transportation Needs: No Transportation Needs (10/07/2023)   PRAPARE - Administrator, Civil Service (  Medical): No    Lack of Transportation (Non-Medical): No  Physical Activity: Insufficiently Active (10/07/2023)   Exercise Vital Sign    Days of Exercise per Week: 5 days    Minutes of Exercise per Session: 20 min  Stress: No Stress Concern Present (10/07/2023)   [employer] of Occupational Health - Occupational Stress Questionnaire    Feeling of Stress : Not at all  Social Connections: Moderately Integrated (10/07/2023)   Social Connection and Isolation Panel [NHANES]    Frequency of Communication with Friends and Family: More than three times a week    Frequency of Social Gatherings with Friends and Family: Three times a week    Attends Religious Services: More than 4 times per year    Active Member of Clubs or Organizations: Yes    Attends Banker Meetings: 1 to 4 times per year    Marital Status: Widowed    Tobacco Counseling Counseling given: Not Answered   Clinical Intake:  Pre-visit  preparation completed: Yes  Pain : No/denies pain     Diabetes: No  How often do you need to have someone help you when you read instructions, pamphlets, or other written materials from your doctor or pharmacy?: 1 - Never  Interpreter Needed?: No  Information entered by :: Kandis Fantasia LPN   Activities of Daily Living    10/07/2023    2:19 PM  In your present state of health, do you have any difficulty performing the following activities:  Hearing? 0  Vision? 0  Difficulty concentrating or making decisions? 0  Walking or climbing stairs? 0  Dressing or bathing? 0  Doing errands, shopping? 0  Preparing Food and eating ? N  Using the Toilet? N  In the past six months, have you accidently leaked urine? N  Do you have problems with loss of bowel control? N  Managing your Medications? N  Managing your Finances? N  Housekeeping or managing your Housekeeping? N    Patient Care Team: Dettinger, Elige Radon, MD as PCP - General (Family Medicine)  Indicate any recent Medical Services you may have received from other than [facility] providers in the past year (date may be approximate).     Assessment:   This is a routine wellness examination for Thor.  Hearing/Vision screen Hearing Screening - Comments:: Wears bilateral hearing aids  Vision Screening - Comments:: Wears rx glasses - up to date with routine eye exams with [facility]     Goals Addressed             This Visit's Progress    Prevent falls        Depression Screen    10/07/2023    2:22 PM 08/08/2023   11:29 AM 04/07/2023    1:56 PM 11/08/2022    2:57 PM 07/26/2022    1:16 PM 05/10/2022    9:53 AM 01/10/2022   11:21 AM  PHQ 2/9 Scores  PHQ - 2 Score 0 0 0 0 0 1 0  PHQ- 9 Score 0 0    4 2    Fall Risk    10/07/2023    2:25 PM 08/08/2023   11:28 AM 04/07/2023    1:56 PM 11/08/2022    2:54 PM 07/26/2022    1:14 PM  Fall Risk   Falls in the past year? 0 0 1 1 0  Number falls in past yr: 0 0 1 1 0   Injury with Fall? 0 0 1 1 0  Risk for fall due to : History of fall(s);Impaired mobility History of fall(s);Impaired balance/gait;Impaired mobility History of fall(s);Impaired balance/gait Impaired balance/gait No Fall Risks  Follow up Falls prevention discussed;Education provided;Falls evaluation completed Falls evaluation completed;Education provided  Falls evaluation completed Falls prevention discussed    MEDICARE RISK AT HOME: Medicare Risk at Home Any stairs in or around the home?: No If so, are there any without handrails?: No Home free of loose throw rugs in walkways, pet beds, electrical cords, etc?: Yes Adequate lighting in your home to reduce risk of falls?: Yes Life alert?: No Use of a cane, walker or w/c?: Yes Grab bars in the bathroom?: Yes Shower chair or bench in shower?: No Elevated toilet seat or a handicapped toilet?: Yes  TIMED UP AND GO:  Was the test performed?  No    Cognitive Function:    05/21/2018   11:49 AM 05/19/2017   11:02 AM 05/17/2016   10:48 AM  MMSE - Mini Mental State Exam  Orientation to time 5 5 5   Orientation to Place 5 5 5   Registration 3 3 3   Attention/ Calculation 3 2 2   Recall 3 3 3   Language- name 2 objects 2 2 2   Language- repeat 1 0 1  Language- follow 3 step command 3 3 3   Language- read & follow direction 1 1 1   Write a sentence 1 1 1   Copy design 1 1 1   Total score 28 26 27         10/07/2023    2:26 PM 07/26/2022    1:17 PM 06/25/2021    2:20 PM  6CIT Screen  What Year? 0 points 0 points 0 points  What month? 0 points 0 points 0 points  What time? 0 points 0 points 0 points  Count back from 20 0 points 0 points 0 points  Months in reverse 0 points 0 points 4 points  Repeat phrase 2 points 0 points 0 points  Total Score 2 points 0 points 4 points    Immunizations Immunization History  Administered Date(s) Administered   Fluad Quad(high Dose 65+) 04/29/2019, 05/31/2020, 05/17/2021   Influenza, High Dose Seasonal  PF 05/20/2016, 05/19/2017, 05/20/2018   Influenza,inj,Quad PF,6+ Mos 06/21/2013, 05/25/2014, 06/07/2015   Influenza-Unspecified 04/01/2023   PFIZER(Purple Top)SARS-COV-2 Vaccination 10/03/2019, 10/28/2019, 07/05/2020   Pneumococcal Conjugate-13 07/11/2015   Pneumococcal Polysaccharide-23 05/19/2017   Respiratory Syncytial Virus Vaccine,Recomb Aduvanted(Arexvy) 08/08/2023   Td 04/29/2019   Tdap 05/27/2007   Zoster Recombinant(Shingrix) 09/23/2017, 06/11/2018   Zoster, Live 12/17/2010    TDAP status: Up to date  Flu Vaccine status: Up to date  Pneumococcal vaccine status: Up to date  Covid-19 vaccine status: Information provided on how to obtain vaccines.   Qualifies for Shingles Vaccine? Yes   Zostavax completed Yes   Shingrix Completed?: Yes  Screening Tests Health Maintenance  Topic Date Due   COVID-19 Vaccine (4 - 2024-25 season) 04/27/2023   Medicare Annual Wellness (AWV)  10/06/2024   DTaP/Tdap/Td (3 - Td or Tdap) 04/28/2029   Pneumonia Vaccine 7+ Years old  Completed   INFLUENZA VACCINE  Completed   Zoster Vaccines- Shingrix  Completed   HPV VACCINES  Aged Out    Health Maintenance  Health Maintenance Due  Topic Date Due   COVID-19 Vaccine (4 - 2024-25 season) 04/27/2023    Colorectal cancer screening: No longer required.   Lung Cancer Screening: (Low Dose CT Chest recommended if Age 56-80 years, 20 pack-year currently smoking OR have quit w/in 15years.)  does not qualify.   Lung Cancer Screening Referral: n/a  Additional Screening:  Hepatitis C Screening: does not qualify  Vision Screening: Recommended annual ophthalmology exams for early detection of glaucoma and other disorders of the eye. Is the patient up to date with their annual eye exam?  Yes  Who is the provider or what is the name of the office in which the patient attends annual eye exams? [facility] If pt is not established with a provider, would they like to be referred to a provider to  establish care? No .   Dental Screening: Recommended annual dental exams for proper oral hygiene  Community Resource Referral / Chronic Care Management: CRR required this visit?  No   CCM required this visit?  No     Plan:     I have personally reviewed and noted the following in the patient's chart:   Medical and social history Use of alcohol, tobacco or illicit drugs  Current medications and supplements including opioid prescriptions. Patient is not currently taking opioid prescriptions. Functional ability and status Nutritional status Physical activity Advanced directives List of other physicians Hospitalizations, surgeries, and ER visits in previous 12 months Vitals Screenings to include cognitive, depression, and falls Referrals and appointments  In addition, I have reviewed and discussed with patient certain preventive protocols, quality metrics, and best practice recommendations. A written personalized care plan for preventive services as well as general preventive health recommendations were provided to patient.     Kandis Fantasia [place], [place]   [id number]/[id number]   After Visit Summary: (MyChart) Due to this being a telephonic visit, the after visit summary with patients personalized plan was offered to patient via MyChart   Nurse Notes: No concerns at this time

## 2023-10-08 ENCOUNTER — Encounter: Payer: Self-pay | Admitting: Family Medicine

## 2023-10-08 ENCOUNTER — Ambulatory Visit (INDEPENDENT_AMBULATORY_CARE_PROVIDER_SITE_OTHER): Payer: Medicare Other | Admitting: Family Medicine

## 2023-10-08 VITALS — BP 125/72 | HR 73 | Ht 72.0 in | Wt 213.0 lb

## 2023-10-08 DIAGNOSIS — D696 Thrombocytopenia, unspecified: Secondary | ICD-10-CM

## 2023-10-08 DIAGNOSIS — F411 Generalized anxiety disorder: Secondary | ICD-10-CM

## 2023-10-08 DIAGNOSIS — F5104 Psychophysiologic insomnia: Secondary | ICD-10-CM | POA: Diagnosis not present

## 2023-10-08 DIAGNOSIS — E782 Mixed hyperlipidemia: Secondary | ICD-10-CM | POA: Diagnosis not present

## 2023-10-08 LAB — LIPID PANEL

## 2023-10-08 MED ORDER — SERTRALINE HCL 50 MG PO TABS: 50.0000 mg | ORAL_TABLET | Freq: Every day | ORAL | 5 refills | Status: DC

## 2023-10-08 MED ORDER — FLUTICASONE PROPIONATE 50 MCG/ACT NA SUSP: 1.0000 | Freq: Two times a day (BID) | NASAL | 6 refills | Status: DC | PRN

## 2023-10-08 MED ORDER — TRAZODONE HCL 100 MG PO TABS: 100.0000 mg | ORAL_TABLET | Freq: Every evening | ORAL | 3 refills | Status: DC | PRN

## 2023-10-08 NOTE — Progress Notes (Addendum)
BP 125/72   Pulse 73   Ht 6' (1.829 m)   Wt 213 lb (96.6 kg)   SpO2 98%   BMI 28.89 kg/m    Subjective:   Patient ID: Gregory Pearson, male    DOB: 28-Dec-1927, 89 y.o.   MRN: [id number]  HPI: Gregory Pearson is a 89 y.o. male presenting on 10/08/2023 for Medical Management of Chronic Issues and Hyperlipidemia   HPI Hyperlipidemia Patient is coming in for recheck of his hyperlipidemia. The patient is currently taking no medication currently, trying for diet control.. They deny any issues with myalgias or history of liver damage from it. They deny any focal numbness or weakness or chest pain.   Thrombocytopenia recheck Patient is coming in today for thrombocytopenia recheck.  He denies any bleeding or abnormal bruising.  Patient is coming in complaining of feeling more anxious and having more symptoms of that and possibly depression during the daytime.  He has taken trazodone in the evening does help in the evening but during the daytime he is having more anxious especially being alone and his thoughts a lot and his son is worried about him and wants to possibly consider helping with that some.  Denies any suicidal ideations or thoughts of hurting himself.    Relevant past medical, surgical, family and social history reviewed and updated as indicated. Interim medical history since our last visit reviewed. Allergies and medications reviewed and updated.  Review of Systems  Constitutional:  Negative for chills and fever.  HENT:  Positive for congestion, postnasal drip and rhinorrhea (Mostly at night). Negative for sinus pressure, sinus pain, sneezing and sore throat.   Eyes:  Negative for visual disturbance.  Respiratory:  Negative for shortness of breath and wheezing.   Cardiovascular:  Negative for chest pain and leg swelling.  Skin:  Negative for rash.  Psychiatric/Behavioral:  Positive for dysphoric mood and sleep disturbance. Negative for self-injury and suicidal ideas. The patient is  nervous/anxious.   All other systems reviewed and are negative.   Per HPI unless specifically indicated above   Allergies as of 10/08/2023   No Known Allergies      Medication List        Accurate as of October 08, 2023  1:57 PM. If you have any questions, ask your nurse or doctor.          STOP taking these medications    Myrbetriq 50 MG Tb24 tablet Generic drug: mirabegron ER Stopped by: Elige Radon Brode Sculley   pantoprazole 40 MG tablet Commonly known as: PROTONIX Stopped by: Elige Radon Dee Paden       TAKE these medications    acetaminophen 500 MG tablet Commonly known as: TYLENOL Take 1 tablet (500 mg total) by mouth every 6 (six) hours as needed. What changed: reasons to take this   fluticasone 50 MCG/ACT nasal spray Commonly known as: FLONASE Place 1 spray into both nostrils 2 (two) times daily as needed for allergies or rhinitis. Started by: Elige Radon Joneisha Miles   multivitamin tablet Take 1 tablet by mouth daily.   mupirocin ointment 2 % Commonly known as: BACTROBAN Apply 1 Application topically 2 (two) times daily.   nabumetone 500 MG tablet Commonly known as: RELAFEN TAKE 2 TABLETS BY MOUTH TWICE  DAILY FOR MUSCLE AND JOINT PAIN   sertraline 50 MG tablet Commonly known as: Zoloft Take 1 tablet (50 mg total) by mouth daily. Started by: Elige Radon Zannie Locastro   traZODone 100 MG tablet Commonly known as:  DESYREL Take 1 tablet (100 mg total) by mouth at bedtime as needed. for sleep What changed:  medication strength how much to take Changed by: Elige Radon Sherwin Hollingshed         Objective:   BP 125/72   Pulse 73   Ht 6' (1.829 m)   Wt 213 lb (96.6 kg)   SpO2 98%   BMI 28.89 kg/m   Wt Readings from Last 3 Encounters:  10/08/23 213 lb (96.6 kg)  10/07/23 211 lb (95.7 kg)  08/08/23 211 lb (95.7 kg)    Physical Exam Vitals and nursing note reviewed.  Constitutional:      General: He is not in acute distress.    Appearance: He is  well-developed. He is not diaphoretic.  Eyes:     General: No scleral icterus.    Conjunctiva/sclera: Conjunctivae normal.  Neck:     Thyroid: No thyromegaly.  Cardiovascular:     Rate and Rhythm: Normal rate and regular rhythm.     Heart sounds: Murmur (Systolic 3 out of 6 murmur best heard at left second intercostal space) heard.  Pulmonary:     Effort: Pulmonary effort is normal. No respiratory distress.     Breath sounds: Normal breath sounds. No wheezing, rhonchi or rales.  Musculoskeletal:        General: Swelling (Wearing compression stockings, 1+ bilateral) present. Normal range of motion.     Cervical back: Neck supple.  Lymphadenopathy:     Cervical: No cervical adenopathy.  Skin:    General: Skin is warm and dry.     Findings: No rash.  Neurological:     Mental Status: He is alert and oriented to person, place, and time.     Coordination: Coordination normal.  Psychiatric:        Behavior: Behavior normal.       Assessment & Plan:   Problem List Items Addressed This Visit       Hematopoietic and Hemostatic   Thrombocytopenia (HCC)   Relevant Orders   CBC with Differential/Platelet   CMP14+EGFR   Lipid panel     Other   Hyperlipidemia - Primary   Relevant Orders   CBC with Differential/Platelet   CMP14+EGFR   Lipid panel   Other Visit Diagnoses       Psychophysiological insomnia       Relevant Medications   traZODone (DESYREL) 100 MG tablet     GAD (generalized anxiety disorder)       Relevant Medications   traZODone (DESYREL) 100 MG tablet   sertraline (ZOLOFT) 50 MG tablet   Other Relevant Orders   CBC with Differential/Platelet       Patient is already taking 2 trazodone daily so we will flip it out to be just the 100 mg tablets so he does not have to take 2 pills.  Added Flonase for his congestion that he can use in the evenings before bedtime.  Will add Zoloft in the daytime to help with anxiety.  Check blood work today, blood  pressure and everything else looks good Follow up plan: Return in about 6 months (around 04/06/2024), or if symptoms worsen or fail to improve, for Hyperlipidemia recheck.  Counseling provided for all of the vaccine components Orders Placed This Encounter  Procedures   CBC with Differential/Platelet   CMP14+EGFR   Lipid panel    Arville Care, MD [facility] 10/08/2023, 1:57 PM

## 2023-10-09 LAB — CBC WITH DIFFERENTIAL/PLATELET
Basophils Absolute: 0 10*3/uL (ref 0.0–0.2)
Basos: 1 %
EOS (ABSOLUTE): 0.1 10*3/uL (ref 0.0–0.4)
Eos: 3 %
Hematocrit: 43.3 % (ref 37.5–51.0)
Hemoglobin: 14.3 g/dL (ref 13.0–17.7)
Immature Grans (Abs): 0 10*3/uL (ref 0.0–0.1)
Immature Granulocytes: 0 %
Lymphocytes Absolute: 1 10*3/uL (ref 0.7–3.1)
Lymphs: 22 %
MCH: 32.1 pg (ref 26.6–33.0)
MCHC: 33 g/dL (ref 31.5–35.7)
MCV: 97 fL (ref 79–97)
Monocytes Absolute: 0.4 10*3/uL (ref 0.1–0.9)
Monocytes: 9 %
Neutrophils Absolute: 3 10*3/uL (ref 1.4–7.0)
Neutrophils: 65 %
Platelets: 139 10*3/uL — ABNORMAL LOW (ref 150–450)
RBC: 4.46 x10E6/uL (ref 4.14–5.80)
RDW: 12.8 % (ref 11.6–15.4)
WBC: 4.5 10*3/uL (ref 3.4–10.8)

## 2023-10-09 LAB — CMP14+EGFR
ALT: 16 IU/L (ref 0–44)
AST: 19 IU/L (ref 0–40)
Albumin: 4.2 g/dL (ref 3.6–4.6)
Alkaline Phosphatase: 85 IU/L (ref 44–121)
BUN/Creatinine Ratio: 17 (ref 10–24)
BUN: 14 mg/dL (ref 10–36)
Bilirubin Total: 0.3 mg/dL (ref 0.0–1.2)
CO2: 29 mmol/L (ref 20–29)
Calcium: 9.3 mg/dL (ref 8.6–10.2)
Chloride: 99 mmol/L (ref 96–106)
Creatinine, Ser: 0.82 mg/dL (ref 0.76–1.27)
Globulin, Total: 2.1 g/dL (ref 1.5–4.5)
Glucose: 99 mg/dL (ref 70–99)
Potassium: 4.7 mmol/L (ref 3.5–5.2)
Sodium: 141 mmol/L (ref 134–144)
Total Protein: 6.3 g/dL (ref 6.0–8.5)
eGFR: 82 mL/min/{1.73_m2} (ref >59)

## 2023-10-09 LAB — LIPID PANEL
Cholesterol, Total: 165 mg/dL (ref 100–199)
HDL: 64 mg/dL (ref >39)
LDL CALC COMMENT:: 2.6 ratio (ref 0.0–5.0)
LDL Chol Calc (NIH): 84 mg/dL (ref 0–99)
Triglycerides: 95 mg/dL (ref 0–149)
VLDL Cholesterol Cal: 17 mg/dL (ref 5–40)

## 2023-10-16 ENCOUNTER — Encounter: Payer: Self-pay | Admitting: Family Medicine

## 2023-11-04 ENCOUNTER — Other Ambulatory Visit: Payer: Self-pay | Admitting: *Deleted

## 2023-11-04 DIAGNOSIS — F411 Generalized anxiety disorder: Secondary | ICD-10-CM

## 2023-11-04 MED ORDER — SERTRALINE HCL 50 MG PO TABS
50.0000 mg | ORAL_TABLET | Freq: Every day | ORAL | 1 refills | Status: DC
Start: 2023-11-04 — End: 2023-12-24

## 2023-11-18 ENCOUNTER — Encounter: Payer: Self-pay | Admitting: Nurse Practitioner

## 2023-11-18 ENCOUNTER — Inpatient Hospital Stay (HOSPITAL_COMMUNITY)
Admission: EM | Admit: 2023-11-18 | Discharge: 2023-11-20 | DRG: 378 | Disposition: A | Discharge status: Home / Self Care | Attending: Family Medicine | Admitting: Family Medicine

## 2023-11-18 ENCOUNTER — Encounter (HOSPITAL_COMMUNITY): Payer: Self-pay | Admitting: Emergency Medicine

## 2023-11-18 ENCOUNTER — Other Ambulatory Visit: Payer: Self-pay

## 2023-11-18 ENCOUNTER — Inpatient Hospital Stay (HOSPITAL_COMMUNITY)

## 2023-11-18 ENCOUNTER — Ambulatory Visit (INDEPENDENT_AMBULATORY_CARE_PROVIDER_SITE_OTHER): Admitting: Nurse Practitioner

## 2023-11-18 VITALS — BP 128/78 | HR 95 | Temp 98.4°F | Ht 72.0 in | Wt 206.2 lb

## 2023-11-18 DIAGNOSIS — Z9842 Cataract extraction status, left eye: Secondary | ICD-10-CM | POA: Diagnosis not present

## 2023-11-18 DIAGNOSIS — R011 Cardiac murmur, unspecified: Secondary | ICD-10-CM | POA: Diagnosis present

## 2023-11-18 DIAGNOSIS — D696 Thrombocytopenia, unspecified: Secondary | ICD-10-CM | POA: Diagnosis present

## 2023-11-18 DIAGNOSIS — Z8546 Personal history of malignant neoplasm of prostate: Secondary | ICD-10-CM | POA: Diagnosis not present

## 2023-11-18 DIAGNOSIS — K254 Chronic or unspecified gastric ulcer with hemorrhage: Principal | ICD-10-CM | POA: Diagnosis present

## 2023-11-18 DIAGNOSIS — I350 Nonrheumatic aortic (valve) stenosis: Secondary | ICD-10-CM | POA: Diagnosis present

## 2023-11-18 DIAGNOSIS — K219 Gastro-esophageal reflux disease without esophagitis: Secondary | ICD-10-CM | POA: Diagnosis present

## 2023-11-18 DIAGNOSIS — Z8719 Personal history of other diseases of the digestive system: Secondary | ICD-10-CM | POA: Diagnosis not present

## 2023-11-18 DIAGNOSIS — R7989 Other specified abnormal findings of blood chemistry: Secondary | ICD-10-CM | POA: Diagnosis not present

## 2023-11-18 DIAGNOSIS — D62 Acute posthemorrhagic anemia: Secondary | ICD-10-CM | POA: Diagnosis present

## 2023-11-18 DIAGNOSIS — Z860101 Personal history of adenomatous and serrated colon polyps: Secondary | ICD-10-CM | POA: Diagnosis not present

## 2023-11-18 DIAGNOSIS — Z9049 Acquired absence of other specified parts of digestive tract: Secondary | ICD-10-CM | POA: Diagnosis not present

## 2023-11-18 DIAGNOSIS — Z77090 Contact with and (suspected) exposure to asbestos: Secondary | ICD-10-CM | POA: Diagnosis present

## 2023-11-18 DIAGNOSIS — Z66 Do not resuscitate: Secondary | ICD-10-CM | POA: Diagnosis present

## 2023-11-18 DIAGNOSIS — I10 Essential (primary) hypertension: Secondary | ICD-10-CM | POA: Diagnosis present

## 2023-11-18 DIAGNOSIS — Z79899 Other long term (current) drug therapy: Secondary | ICD-10-CM | POA: Diagnosis not present

## 2023-11-18 DIAGNOSIS — Z791 Long term (current) use of non-steroidal anti-inflammatories (NSAID): Secondary | ICD-10-CM

## 2023-11-18 DIAGNOSIS — Z8042 Family history of malignant neoplasm of prostate: Secondary | ICD-10-CM

## 2023-11-18 DIAGNOSIS — Z8711 Personal history of peptic ulcer disease: Secondary | ICD-10-CM

## 2023-11-18 DIAGNOSIS — Z9079 Acquired absence of other genital organ(s): Secondary | ICD-10-CM | POA: Diagnosis not present

## 2023-11-18 DIAGNOSIS — Z9841 Cataract extraction status, right eye: Secondary | ICD-10-CM

## 2023-11-18 DIAGNOSIS — Z961 Presence of intraocular lens: Secondary | ICD-10-CM | POA: Diagnosis present

## 2023-11-18 DIAGNOSIS — K922 Gastrointestinal hemorrhage, unspecified: Secondary | ICD-10-CM | POA: Diagnosis not present

## 2023-11-18 DIAGNOSIS — F32A Depression, unspecified: Secondary | ICD-10-CM | POA: Diagnosis present

## 2023-11-18 DIAGNOSIS — E785 Hyperlipidemia, unspecified: Secondary | ICD-10-CM | POA: Diagnosis present

## 2023-11-18 DIAGNOSIS — M170 Bilateral primary osteoarthritis of knee: Secondary | ICD-10-CM | POA: Diagnosis present

## 2023-11-18 DIAGNOSIS — T39395A Adverse effect of other nonsteroidal anti-inflammatory drugs [NSAID], initial encounter: Secondary | ICD-10-CM | POA: Diagnosis present

## 2023-11-18 DIAGNOSIS — Z9889 Other specified postprocedural states: Secondary | ICD-10-CM

## 2023-11-18 DIAGNOSIS — Z8619 Personal history of other infectious and parasitic diseases: Secondary | ICD-10-CM

## 2023-11-18 DIAGNOSIS — K921 Melena: Secondary | ICD-10-CM

## 2023-11-18 DIAGNOSIS — Z818 Family history of other mental and behavioral disorders: Secondary | ICD-10-CM

## 2023-11-18 DIAGNOSIS — K259 Gastric ulcer, unspecified as acute or chronic, without hemorrhage or perforation: Secondary | ICD-10-CM | POA: Diagnosis not present

## 2023-11-18 LAB — COMPREHENSIVE METABOLIC PANEL
ALT: 20 U/L (ref 0–44)
AST: 22 U/L (ref 15–41)
Albumin: 3.5 g/dL (ref 3.5–5.0)
Alkaline Phosphatase: 53 U/L (ref 38–126)
Anion gap: 11 (ref 5–15)
BUN: 45 mg/dL — ABNORMAL HIGH (ref 8–23)
CO2: 24 mmol/L (ref 22–32)
Calcium: 8.8 mg/dL — ABNORMAL LOW (ref 8.9–10.3)
Chloride: 101 mmol/L (ref 98–111)
Creatinine, Ser: 0.7 mg/dL (ref 0.61–1.24)
GFR, Estimated: >60 mL/min (ref >60)
Glucose, Bld: 107 mg/dL — ABNORMAL HIGH (ref 70–99)
Potassium: 4.3 mmol/L (ref 3.5–5.1)
Sodium: 136 mmol/L (ref 135–145)
Total Bilirubin: 0.7 mg/dL (ref 0.0–1.2)
Total Protein: 5.9 g/dL — ABNORMAL LOW (ref 6.5–8.1)

## 2023-11-18 LAB — CBC WITH DIFFERENTIAL/PLATELET
Abs Immature Granulocytes: 0.02 10*3/uL (ref 0.00–0.07)
Basophils Absolute: 0 10*3/uL (ref 0.0–0.1)
Basophils Relative: 1 %
Eosinophils Absolute: 0 10*3/uL (ref 0.0–0.5)
Eosinophils Relative: 1 %
HCT: 37.9 % — ABNORMAL LOW (ref 39.0–52.0)
Hemoglobin: 12.4 g/dL — ABNORMAL LOW (ref 13.0–17.0)
Immature Granulocytes: 0 %
Lymphocytes Relative: 16 %
Lymphs Abs: 1 10*3/uL (ref 0.7–4.0)
MCH: 32.4 pg (ref 26.0–34.0)
MCHC: 32.7 g/dL (ref 30.0–36.0)
MCV: 99 fL (ref 80.0–100.0)
Monocytes Absolute: 0.5 10*3/uL (ref 0.1–1.0)
Monocytes Relative: 8 %
Neutro Abs: 4.7 10*3/uL (ref 1.7–7.7)
Neutrophils Relative %: 74 %
Platelets: 138 10*3/uL — ABNORMAL LOW (ref 150–400)
RBC: 3.83 MIL/uL — ABNORMAL LOW (ref 4.22–5.81)
RDW: 13.9 % (ref 11.5–15.5)
WBC: 6.3 10*3/uL (ref 4.0–10.5)
nRBC: 0 % (ref 0.0–0.2)

## 2023-11-18 LAB — TYPE AND SCREEN
ABO/RH(D): O POS
Antibody Screen: NEGATIVE

## 2023-11-18 LAB — PROTIME-INR
INR: 1 (ref 0.8–1.2)
Prothrombin Time: 13.8 s (ref 11.4–15.2)

## 2023-11-18 LAB — POC OCCULT BLOOD, ED

## 2023-11-18 LAB — APTT: aPTT: 29 s (ref 24–36)

## 2023-11-18 MED ORDER — SODIUM CHLORIDE 0.9% FLUSH
3.0000 mL | Freq: Two times a day (BID) | INTRAVENOUS | Status: DC
  Administered 2023-11-18 – 2023-11-20 (×4): 3 mL via INTRAVENOUS

## 2023-11-18 MED ORDER — FLUTICASONE PROPIONATE 50 MCG/ACT NA SUSP: 1.0000 | Freq: Two times a day (BID) | NASAL | Status: DC | PRN

## 2023-11-18 MED ORDER — ACETAMINOPHEN 325 MG PO TABS: 650.0000 mg | ORAL_TABLET | Freq: Four times a day (QID) | ORAL | Status: DC | PRN

## 2023-11-18 MED ORDER — POLYETHYLENE GLYCOL 3350 17 G PO PACK
17.0000 g | PACK | Freq: Every day | ORAL | Status: DC | PRN
  Administered 2023-11-19: 17 g via ORAL
  Filled 2023-11-18: qty 1

## 2023-11-18 MED ORDER — PANTOPRAZOLE SODIUM 40 MG IV SOLR
40.0000 mg | Freq: Two times a day (BID) | INTRAVENOUS | Status: DC
Start: 2023-11-18 — End: 2023-11-20
  Administered 2023-11-18 – 2023-11-20 (×4): 40 mg via INTRAVENOUS
  Filled 2023-11-18 (×5): qty 10

## 2023-11-18 MED ORDER — SODIUM CHLORIDE 0.9 % IV SOLN: INTRAVENOUS | Status: DC

## 2023-11-18 MED ORDER — TRAZODONE HCL 50 MG PO TABS
100.0000 mg | ORAL_TABLET | Freq: Every evening | ORAL | Status: DC | PRN
  Administered 2023-11-18 – 2023-11-19 (×2): 100 mg via ORAL
  Filled 2023-11-18 (×2): qty 2

## 2023-11-18 MED ORDER — SODIUM CHLORIDE 0.9 % IV BOLUS
500.0000 mL | Freq: Once | INTRAVENOUS | Status: AC
  Administered 2023-11-18: 500 mL via INTRAVENOUS

## 2023-11-18 MED ORDER — ACETAMINOPHEN 650 MG RE SUPP: 650.0000 mg | Freq: Four times a day (QID) | RECTAL | Status: DC | PRN

## 2023-11-18 MED ORDER — PANTOPRAZOLE SODIUM 40 MG IV SOLR
40.0000 mg | Freq: Once | INTRAVENOUS | Status: AC
  Administered 2023-11-18: 40 mg via INTRAVENOUS
  Filled 2023-11-18: qty 10

## 2023-11-18 MED ORDER — SERTRALINE HCL 50 MG PO TABS
50.0000 mg | ORAL_TABLET | Freq: Every day | ORAL | Status: DC
  Administered 2023-11-19 – 2023-11-20 (×2): 50 mg via ORAL
  Filled 2023-11-18 (×2): qty 1

## 2023-11-18 NOTE — H&P (View-Only) (Signed)
 @LOGO @   Referring Provider: Bethann Berkshire, MD Primary Care Physician:  Dettinger, Elige Radon, MD Primary Gastroenterologist:  Dr. Jena Gauss  Date of Admission: 11/18/23 Date of Consultation: 11/18/23  Reason for Consultation:  Melena  HPI:  Gregory Pearson is a 89 y.o. year old male with history of arthritis, HLD, GERD, GI bleed in the setting of peptic ulcer disease suspected to be NSAID related, presenting to the emergency room today with chief complaint of black tarry stools that started earlier this morning.  ED course: Initially hypertensive with BP 140/76.  BP declined to 101/69, heart rate initially 95 > 77, SpO2 96%>> 92%. He was found to have a hemoglobin of 12.4, down from 14.3, 1 month ago.  Fecal occult blood positive.  BUN elevated at 45. He was given IV PPI, 500 cc fluid bolus, and GI consulted for further evaluation.  Consult:  Reports single loose black stool around 9:30 this morning. This is the first time this has occurred in a few years. No brbpr.  Denies NSAIDs.  States that he takes Tylenol only.  Does not take a PPI.  Denies abdominal pain, nausea, vomiting, reflux, dysphagia, unintentional weight loss, or other bowel issues.  In general, patein remains very active in his daily life going on trips with his church etc. Plans to go on a cruise in May.    Last EGD 10/20/2019: Minimally retained gastric contents, stomach incompletely seen, previously noted gastric ulcer completely healed with scar present.  Recommended discontinuing Protonix as long as no NSAIDs.   Past Medical History:  Diagnosis Date   Asbestos exposure    Blood transfusion without reported diagnosis    after ruptured appendix 1963   Diverticulosis    Gastric ulcer 06/2019   GERD (gastroesophageal reflux disease)    History of stomach ulcers 1995   H.pylori s/p treatment   History of stomach ulcers    Hyperlipidemia    Muscle strain of right gluteal region 01/24/2014   Prostate cancer (HCC) 2002    prostatectomy   Traumatic hematoma of buttock 01/24/2014    Past Surgical History:  Procedure Laterality Date   APPENDECTOMY  1963   ruptured appendix/gangrene   BIOPSY  07/06/2019   Procedure: BIOPSY;  Surgeon: Corbin Ade, MD;  [facility];  Service: Endoscopy;;   CATARACT EXTRACTION W/PHACO Left 04/04/2023   Procedure: CATARACT EXTRACTION PHACO AND INTRAOCULAR LENS PLACEMENT (IOC);  Surgeon: Pecolia Ades, MD;  Location: [facility];  Service: Ophthalmology;  Laterality: Left;  CDE  5.20   CATARACT EXTRACTION W/PHACO Right 04/18/2023   Procedure: CATARACT EXTRACTION PHACO AND INTRAOCULAR LENS PLACEMENT (IOC);  Surgeon: Pecolia Ades, MD;  Location: [facility];  Service: Ophthalmology;  Laterality: Right;  CDE 12.07   COLONOSCOPY  2013   Dr. Russella Dar: multiple colon polyps (tubular adenomas), diverticulosis. no further surveillance colonoscopies due to age.   ESOPHAGOGASTRODUODENOSCOPY  [id number]   Dr. Russella Dar: gastric ulcer (h.pylori + s/p tx), follow up EGD verified ulcer healing   ESOPHAGOGASTRODUODENOSCOPY N/A 09/26/2016   Procedure: ESOPHAGOGASTRODUODENOSCOPY (EGD);  Surgeon: Corbin Ade, MD;  [facility];  Service: Endoscopy;  Laterality: N/A;   ESOPHAGOGASTRODUODENOSCOPY N/A 01/23/2017   Procedure: ESOPHAGOGASTRODUODENOSCOPY (EGD);  Surgeon: Corbin Ade, MD;  [facility];  Service: Endoscopy;  Laterality: N/A;  12:15pm   ESOPHAGOGASTRODUODENOSCOPY N/A 07/06/2019   Procedure: ESOPHAGOGASTRODUODENOSCOPY (EGD);  Surgeon: Corbin Ade, MD; normal esophagus, nonbleeding gastric ulcer with pigmented material, abnormal gastric mucosa of uncertain significance s/p biopsy,  normal duodenum.  Pathology with mild reactive gastropathy, mild chronic gastritis, H. pylori negative.   ESOPHAGOGASTRODUODENOSCOPY N/A 10/20/2019   Procedure: ESOPHAGOGASTRODUODENOSCOPY (EGD);  Surgeon: Corbin Ade, MD;  [facility];  Service: Endoscopy;  Laterality:  N/A;  9:30am   HEMORRHOID SURGERY  1970   LAPAROSCOPIC CHOLECYSTECTOMY  2003   PROSTATECTOMY  2002    Prior to Admission medications   Medication Sig Start Date End Date Taking? Authorizing Provider  acetaminophen (TYLENOL) 500 MG tablet Take 1 tablet (500 mg total) by mouth every 6 (six) hours as needed. Patient taking differently: Take 500 mg by mouth every 6 (six) hours as needed for fever or mild pain (pain score 1-3). 11/14/21   Daryll Drown, NP  fluticasone (FLONASE) 50 MCG/ACT nasal spray Place 1 spray into both nostrils 2 (two) times daily as needed for allergies or rhinitis. 10/08/23   Dettinger, Elige Radon, MD  Multiple Vitamin (MULTIVITAMIN) tablet Take 1 tablet by mouth daily.    [provider]  mupirocin ointment (BACTROBAN) 2 % Apply 1 Application topically 2 (two) times daily. 08/08/23   Dettinger, Elige Radon, MD  nabumetone (RELAFEN) 500 MG tablet TAKE 2 TABLETS BY MOUTH TWICE  DAILY FOR MUSCLE AND JOINT PAIN 03/17/23   Dettinger, Elige Radon, MD  sertraline (ZOLOFT) 50 MG tablet Take 1 tablet (50 mg total) by mouth daily. 11/04/23   Dettinger, Elige Radon, MD  traZODone (DESYREL) 100 MG tablet Take 1 tablet (100 mg total) by mouth at bedtime as needed. for sleep 10/08/23   Dettinger, Elige Radon, MD    Current Facility-Administered Medications  Medication Dose Route Frequency Provider Last Rate Last Admin   pantoprazole (PROTONIX) injection 40 mg  40 mg Intravenous Once Bethann Berkshire, MD       sodium chloride 0.9 % bolus 500 mL  500 mL Intravenous Once Bethann Berkshire, MD       Current Outpatient Medications  Medication Sig Dispense Refill   acetaminophen (TYLENOL) 500 MG tablet Take 1 tablet (500 mg total) by mouth every 6 (six) hours as needed. (Patient taking differently: Take 500 mg by mouth every 6 (six) hours as needed for fever or mild pain (pain score 1-3).) 30 tablet 0   fluticasone (FLONASE) 50 MCG/ACT nasal spray Place 1 spray into both nostrils 2 (two) times daily  as needed for allergies or rhinitis. 16 g 6   Multiple Vitamin (MULTIVITAMIN) tablet Take 1 tablet by mouth daily.     mupirocin ointment (BACTROBAN) 2 % Apply 1 Application topically 2 (two) times daily. 22 g 1   nabumetone (RELAFEN) 500 MG tablet TAKE 2 TABLETS BY MOUTH TWICE  DAILY FOR MUSCLE AND JOINT PAIN 360 tablet 3   sertraline (ZOLOFT) 50 MG tablet Take 1 tablet (50 mg total) by mouth daily. 90 tablet 1   traZODone (DESYREL) 100 MG tablet Take 1 tablet (100 mg total) by mouth at bedtime as needed. for sleep 90 tablet 3    Allergies as of 11/18/2023   (No Known Allergies)    Family History  Problem Relation Age of Onset   Prostate cancer Brother    Prostate cancer Brother    Suicidality Brother    Prostate cancer Brother    Stomach cancer Neg Hx    Colon cancer Neg Hx     Social History   Socioeconomic History   Marital status: Widowed    Spouse name: Not on file   Number of children: 3   Years  of education: Not on file   Highest education level: Not on file  Occupational History   Occupation: retired Psychiatric nurse power  Tobacco Use   Smoking status: Never   Smokeless tobacco: Never  Vaping Use   Vaping status: Never Used  Substance and Sexual Activity   Alcohol use: Not Currently    Comment: last drank about 15 years ago   Drug use: Never   Sexual activity: Not on file  Other Topics Concern   Not on file  Social History Narrative   3 children.   Widowed since 01/16/2021. Married x 61 years.    Social Drivers of Corporate investment banker Strain: Low Risk  (10/07/2023)   Overall Financial Resource Strain (CARDIA)    Difficulty of Paying Living Expenses: Not hard at all  Food Insecurity: No Food Insecurity (10/07/2023)   Hunger Vital Sign    Worried About Running Out of Food in the Last Year: Never true    Ran Out of Food in the Last Year: Never true  Transportation Needs: No Transportation Needs (10/07/2023)   PRAPARE - Administrator, Civil Service  (Medical): No    Lack of Transportation (Non-Medical): No  Physical Activity: Insufficiently Active (10/07/2023)   Exercise Vital Sign    Days of Exercise per Week: 5 days    Minutes of Exercise per Session: 20 min  Stress: No Stress Concern Present (10/07/2023)   [employer] of Occupational Health - Occupational Stress Questionnaire    Feeling of Stress : Not at all  Social Connections: Moderately Integrated (10/07/2023)   Social Connection and Isolation Panel [NHANES]    Frequency of Communication with Friends and Family: More than three times a week    Frequency of Social Gatherings with Friends and Family: Three times a week    Attends Religious Services: More than 4 times per year    Active Member of Clubs or Organizations: Yes    Attends Banker Meetings: 1 to 4 times per year    Marital Status: Widowed  Intimate Partner Violence: Not At Risk (10/07/2023)   Humiliation, Afraid, Rape, and Kick questionnaire    Fear of Current or Ex-Partner: No    Emotionally Abused: No    Physically Abused: No    Sexually Abused: No    Review of Systems: Gen: Denies fever, chills, cold or flulike symptoms, presyncope, syncope. CV: Denies chest pain, heart palpitations.  Resp: Denies shortness of breath, cough.  GI: See HPI GU : Denies urinary burning, urinary frequency, urinary incontinence.  MS: Admits to bilateral knee pain related to arthritis. Derm: Denies rash.  Psych: Denies depression, anxiety.  Heme: See HPI  Physical Exam: Vital signs in last 24 hours: Temp:  [98.2 F (36.8 C)-98.4 F (36.9 C)] 98.2 F (36.8 C) (03/25 1536) Pulse Rate:  [77-95] 77 (03/25 1515) Resp:  [18] 18 (03/25 1515) BP: (101-149)/(69-80) 101/69 (03/25 1515) SpO2:  [92 %-96 %] 92 % (03/25 1515) Weight:  [93 kg-93.5 kg] 93 kg (03/25 1400)   General:   Alert,  Well-developed, well-nourished, pleasant and cooperative in NAD Head:  Normocephalic and atraumatic. Eyes:  Sclera clear, no  icterus.   Conjunctiva pink. Ears:  Normal auditory acuity. Nose:  No deformity, discharge,  or lesions. Lungs:  Clear throughout to auscultation.   No wheezes, crackles, or rhonchi. No acute distress. Heart:  Regular rate and rhythm/ Systolic murmur appreciated.  Abdomen:  Soft, nontender and nondistended. No masses, hepatosplenomegaly or hernias  noted. Normal bowel sounds, without guarding, and without rebound.   Rectal:  Deferred  Msk:  Symmetrical without gross deformities. Normal posture. Extremities:  With 1 + bilateral LE edema.  Neurologic:  Alert and  oriented x4;  grossly normal neurologically. Skin:  Intact without significant lesions or rashes. Psych:  Normal mood and affect.  Intake/Output from previous day: No intake/output data recorded. Intake/Output this shift: No intake/output data recorded.  Lab Results: Recent Labs    11/18/23 1448  WBC 6.3  HGB 12.4*  HCT 37.9*  PLT 138*   BMET Recent Labs    11/18/23 1448  NA 136  K 4.3  CL 101  CO2 24  GLUCOSE 107*  BUN 45*  CREATININE 0.70  CALCIUM 8.8*   LFT Recent Labs    11/18/23 1448  PROT 5.9*  ALBUMIN 3.5  AST 22  ALT 20  ALKPHOS 53  BILITOT 0.7   PT/INR Recent Labs    11/18/23 1448  LABPROT 13.8  INR 1.0     Impression: 89 y.o. year old male with history of arthritis, HLD, GERD, GI bleed in the setting of peptic ulcer disease suspected to be NSAID related with last EGD in 2021 showing prior ulcer completely healed and no recommendations to continue daily PPI as long as patient avoided NSAIDs, now presenting to the ER today following a single episode of melena. He was found to have a drop in his Hgb from 14.3, 1 month ago, to 12.4, FOBT positive. BUN also elevated at 45. Denies any other significant GI symptoms. Denies NSAIDs.   Presentation is concerning for PUD. Unable to rule out other etiologies such as AVMs. Dieulafoy lesion, and less likely malignancy.   Plan: Clear liquid diet  today.  NPO at midnight except for sips with meds.  IV PPI BID.  Proceed with upper endoscopy with propofol with Dr. Jena Gauss tomorrow. The risks, benefits, and alternatives have been discussed with the patient in detail. The patient states understanding and desires to proceed. Continue to monitor H/H and for overt GI bleeding.      LOS: 0 days    11/18/2023, 3:47 PM   Ermalinda Memos, [facility]

## 2023-11-18 NOTE — Addendum Note (Signed)
 Addended by: Daisy Blossom on: 11/18/2023 01:26 PM   Modules accepted: Orders

## 2023-11-18 NOTE — H&P (Signed)
 History and Physical    Patient: Gregory Pearson ZOX:[id number] DOB: 03/17/1928 DOA: 11/18/2023 DOS: the patient was seen and examined on 11/18/2023 PCP: Dettinger, Elige Radon, MD  Patient coming from: Home  Chief Complaint:  Chief Complaint  Patient presents with   Melena   HPI: Gregory Pearson is a 89 y.o. male with medical history significant of prior peptic ulcer disease with remote GI bleed episodes.  Patient was in his usual state of health till approximately 9 AM when he reports a single episode of black tarry stool.  Given his prior experience with GI bleed, patient recognized this as a symptom of GI bleed and came to the ER.  Patient noted to be vitally stable in the ER although his hemoglobin was noted to have dropped from a baseline of about 14 to about 12.  Patient denies any vomiting any abdominal pain any fever or any presyncope any chest pain or shortness of breath.  Patient reports that in the ER he had 1 more episode of black stool which was actually a collected.  And is pending lab evaluation.  Patient is s/p 500 cc fluid in the ER as well as pantoprazole.  Medical evaluation is sought.  GI consult in chart, they are anticipating endoscopy in the morning.  Patient is currently totally asymptomatic. Review of Systems: As mentioned in the history of present illness. All other systems reviewed and are negative. Past Medical History:  Diagnosis Date   Asbestos exposure    Blood transfusion without reported diagnosis    after ruptured appendix 1963   Diverticulosis    Gastric ulcer 06/2019   GERD (gastroesophageal reflux disease)    History of stomach ulcers 1995   H.pylori s/p treatment   History of stomach ulcers    Hyperlipidemia    Muscle strain of right gluteal region 01/24/2014   Prostate cancer (HCC) 2002   prostatectomy   Traumatic hematoma of buttock 01/24/2014   Past Surgical History:  Procedure Laterality Date   APPENDECTOMY  1963   ruptured appendix/gangrene    BIOPSY  07/06/2019   Procedure: BIOPSY;  Surgeon: Corbin Ade, MD;  [facility];  Service: Endoscopy;;   CATARACT EXTRACTION W/PHACO Left 04/04/2023   Procedure: CATARACT EXTRACTION PHACO AND INTRAOCULAR LENS PLACEMENT (IOC);  Surgeon: Pecolia Ades, MD;  Location: [facility];  Service: Ophthalmology;  Laterality: Left;  CDE  5.20   CATARACT EXTRACTION W/PHACO Right 04/18/2023   Procedure: CATARACT EXTRACTION PHACO AND INTRAOCULAR LENS PLACEMENT (IOC);  Surgeon: Pecolia Ades, MD;  Location: [facility];  Service: Ophthalmology;  Laterality: Right;  CDE 12.07   COLONOSCOPY  2013   Dr. Russella Dar: multiple colon polyps (tubular adenomas), diverticulosis. no further surveillance colonoscopies due to age.   ESOPHAGOGASTRODUODENOSCOPY  [id number]   Dr. Russella Dar: gastric ulcer (h.pylori + s/p tx), follow up EGD verified ulcer healing   ESOPHAGOGASTRODUODENOSCOPY N/A 09/26/2016   Procedure: ESOPHAGOGASTRODUODENOSCOPY (EGD);  Surgeon: Corbin Ade, MD;  [facility];  Service: Endoscopy;  Laterality: N/A;   ESOPHAGOGASTRODUODENOSCOPY N/A 01/23/2017   Procedure: ESOPHAGOGASTRODUODENOSCOPY (EGD);  Surgeon: Corbin Ade, MD;  [facility];  Service: Endoscopy;  Laterality: N/A;  12:15pm   ESOPHAGOGASTRODUODENOSCOPY N/A 07/06/2019   Procedure: ESOPHAGOGASTRODUODENOSCOPY (EGD);  Surgeon: Corbin Ade, MD; normal esophagus, nonbleeding gastric ulcer with pigmented material, abnormal gastric mucosa of uncertain significance s/p biopsy, normal duodenum.  Pathology with mild reactive gastropathy, mild chronic gastritis, H. pylori negative.   ESOPHAGOGASTRODUODENOSCOPY N/A 10/20/2019   Procedure:  ESOPHAGOGASTRODUODENOSCOPY (EGD);  Surgeon: Corbin Ade, MD;  [facility];  Service: Endoscopy;  Laterality: N/A;  9:30am   HEMORRHOID SURGERY  1970   LAPAROSCOPIC CHOLECYSTECTOMY  2003   PROSTATECTOMY  2002   Social History:  reports that he has never smoked. He has never  used smokeless tobacco. He reports that he does not currently use alcohol. He reports that he does not use drugs.  No Known Allergies  Family History  Problem Relation Age of Onset   Prostate cancer Brother    Prostate cancer Brother    Suicidality Brother    Prostate cancer Brother    Stomach cancer Neg Hx    Colon cancer Neg Hx     Prior to Admission medications   Medication Sig Start Date End Date Taking? Authorizing Provider  famotidine (PEPCID) 10 MG tablet Take 10 mg by mouth daily as needed for heartburn or indigestion.   Yes [provider]  acetaminophen (TYLENOL) 500 MG tablet Take 1 tablet (500 mg total) by mouth every 6 (six) hours as needed. Patient taking differently: Take 500 mg by mouth every 6 (six) hours as needed for fever or mild pain (pain score 1-3). 11/14/21  Yes Daryll Drown, NP  fluticasone (FLONASE) 50 MCG/ACT nasal spray Place 1 spray into both nostrils 2 (two) times daily as needed for allergies or rhinitis. 10/08/23  Yes Dettinger, Elige Radon, MD  Multiple Vitamin (MULTIVITAMIN) tablet Take 1 tablet by mouth daily.   Yes [provider]  mupirocin ointment (BACTROBAN) 2 % Apply 1 Application topically 2 (two) times daily. 08/08/23  Yes Dettinger, Elige Radon, MD  nabumetone (RELAFEN) 500 MG tablet TAKE 2 TABLETS BY MOUTH TWICE  DAILY FOR MUSCLE AND JOINT PAIN 03/17/23  Yes Dettinger, Elige Radon, MD  sertraline (ZOLOFT) 50 MG tablet Take 1 tablet (50 mg total) by mouth daily. 11/04/23  Yes Dettinger, Elige Radon, MD  traZODone (DESYREL) 100 MG tablet Take 1 tablet (100 mg total) by mouth at bedtime as needed. for sleep 10/08/23  Yes Dettinger, Elige Radon, MD    Physical Exam: Vitals:   11/18/23 1400 11/18/23 1515 11/18/23 1536  BP: (!) 149/80 101/69   Pulse: 95 77   Resp:  18   Temp:   98.2 F (36.8 C)  TempSrc:   Oral  SpO2: 95% 92%   Weight: 93 kg    Height: 6' (1.829 m)     General: Patient is alert and awake, gives a coherent account of his  symptoms appears to be in no distress. Respiratory exam: Bilateral intravesicular air cardiovascular exam S1-S2 normal Abdomen all quadrants are soft nontender Bowel sounds appear to be normal Extremities warm with trace edema. Data Reviewed:  Labs on Admission:  Results for orders placed or performed during the hospital encounter of 11/18/23 (from the past 24 hours)  Comprehensive metabolic panel     Status: Abnormal   Collection Time: 11/18/23  2:48 PM  Result Value Ref Range   Sodium 136 135 - 145 mmol/L   Potassium 4.3 3.5 - 5.1 mmol/L   Chloride 101 98 - 111 mmol/L   CO2 24 22 - 32 mmol/L   Glucose, Bld 107 (H) 70 - 99 mg/dL   BUN 45 (H) 8 - 23 mg/dL   Creatinine, Ser 8.41 0.61 - 1.24 mg/dL   Calcium 8.8 (L) 8.9 - 10.3 mg/dL   Total Protein 5.9 (L) 6.5 - 8.1 g/dL   Albumin 3.5 3.5 - 5.0 g/dL  AST 22 15 - 41 U/L   ALT 20 0 - 44 U/L   Alkaline Phosphatase 53 38 - 126 U/L   Total Bilirubin 0.7 0.0 - 1.2 mg/dL   GFR, Estimated >65 >78 mL/min   Anion gap 11 5 - 15  CBC with Differential     Status: Abnormal   Collection Time: 11/18/23  2:48 PM  Result Value Ref Range   WBC 6.3 4.0 - 10.5 K/uL   RBC 3.83 (L) 4.22 - 5.81 MIL/uL   Hemoglobin 12.4 (L) 13.0 - 17.0 g/dL   HCT 46.9 (L) 62.9 - 52.8 %   MCV 99.0 80.0 - 100.0 fL   MCH 32.4 26.0 - 34.0 pg   MCHC 32.7 30.0 - 36.0 g/dL   RDW 41.3 24.4 - 01.0 %   Platelets 138 (L) 150 - 400 K/uL   nRBC 0.0 0.0 - 0.2 %   Neutrophils Relative % 74 %   Neutro Abs 4.7 1.7 - 7.7 K/uL   Lymphocytes Relative 16 %   Lymphs Abs 1.0 0.7 - 4.0 K/uL   Monocytes Relative 8 %   Monocytes Absolute 0.5 0.1 - 1.0 K/uL   Eosinophils Relative 1 %   Eosinophils Absolute 0.0 0.0 - 0.5 K/uL   Basophils Relative 1 %   Basophils Absolute 0.0 0.0 - 0.1 K/uL   Immature Granulocytes 0 %   Abs Immature Granulocytes 0.02 0.00 - 0.07 K/uL  Protime-INR     Status: None   Collection Time: 11/18/23  2:48 PM  Result Value Ref Range   Prothrombin Time 13.8  11.4 - 15.2 seconds   INR 1.0 0.8 - 1.2  Type and screen [facility]     Status: None   Collection Time: 11/18/23  2:48 PM  Result Value Ref Range   ABO/RH(D) O POS    Antibody Screen NEG    Sample Expiration      11/21/2023,2359 Performed at [facility], [address], [place], [place] [address]   POC occult blood, ED     Status: Abnormal   Collection Time: 11/18/23  3:01 PM  Result Value Ref Range   Positive     Basic Metabolic Panel: Recent Labs  Lab 11/18/23 1448  NA 136  K 4.3  CL 101  CO2 24  GLUCOSE 107*  BUN 45*  CREATININE 0.70  CALCIUM 8.8*   Liver Function Tests: Recent Labs  Lab 11/18/23 1448  AST 22  ALT 20  ALKPHOS 53  BILITOT 0.7  PROT 5.9*  ALBUMIN 3.5   No results for input(s): "LIPASE", "AMYLASE" in the last 168 hours. No results for input(s): "AMMONIA" in the last 168 hours. CBC: Recent Labs  Lab 11/18/23 1448  WBC 6.3  NEUTROABS 4.7  HGB 12.4*  HCT 37.9*  MCV 99.0  PLT 138*   Cardiac Enzymes: No results for input(s): "CKTOTAL", "CKMB", "CKMBINDEX", "TROPONINIHS" in the last 168 hours.  BNP (last 3 results) No results for input(s): "PROBNP" in the last 8760 hours. CBG: No results for input(s): "GLUCAP" in the last 168 hours.  Radiological Exams on Admission:  No results found.  chest X-ray  No intake/output data recorded. No intake/output data recorded.    Assessment and Plan: * Melena Based on elevated BUN, patient seems to have an upper GI bleed.  Empirically started on pantoprazole.  Type and screen has already been sent.  No suggestion of liver disease thus far.  Empiric normal saline ordered at 75 cc an hour.  Patient to have clear liquid diet tonight, n.p.o. past midnight.  Plan for endoscopy by GI in the morning. Check PTT in AM   Baseline preop cxr and EKG ordered  Med rec as folows: Hold nabumetone C.w. zoloft C.w. traxodone C.w. flonase    Advance Care Planning:   Code Status: Do not attempt  resuscitation (DNR) PRE-ARREST INTERVENTIONS DESIRED per patient wishes  Consults: GI  Family Communication: patient says that he has updated son tyrone. Does not wish for me to call.  Severity of Illness: The appropriate patient status for this patient is INPATIENT. Inpatient status is judged to be reasonable and necessary in order to provide the required intensity of service to ensure the patient's safety. The patient's presenting symptoms, physical exam findings, and initial radiographic and laboratory data in the context of their chronic comorbidities is felt to place them at high risk for further clinical deterioration. Furthermore, it is not anticipated that the patient will be medically stable for discharge from the hospital within 2 midnights of admission.   * I certify that at the point of admission it is my clinical judgment that the patient will require inpatient hospital care spanning beyond 2 midnights from the point of admission due to high intensity of service, high risk for further deterioration and high frequency of surveillance required.*  Author: Nolberto Hanlon, MD 11/18/2023 6:48 PM  For on call review [email or web address].

## 2023-11-18 NOTE — Progress Notes (Signed)
 Patient arrived to room 316 via w/c.  Patient ambulated with assistance to the bed.  Patient is A&O x 4.  Patient has his own adjustable height wheeled walker in the room.  Security to the room to get patient's wallet to lock up - bag #[id number].  Wallet contained $567.00 cash, 2 bank cards and insurance cards.

## 2023-11-18 NOTE — ED Notes (Signed)
 Pt given a urinal.

## 2023-11-18 NOTE — Assessment & Plan Note (Addendum)
 Based on elevated BUN, patient seems to have an upper GI bleed.  Empirically started on pantoprazole.  Type and screen has already been sent.  No suggestion of liver disease thus far.  Empiric normal saline ordered at 75 cc an hour.  Patient to have clear liquid diet tonight, n.p.o. past midnight.  Plan for endoscopy by GI in the morning. Check PTT in AM Avoid nabumetone although it preferentially inhibit cox2

## 2023-11-18 NOTE — Progress Notes (Signed)
 Acute Office Visit  Subjective:     Patient ID: Gregory Pearson, male    DOB: 14-Jan-1928, 89 y.o.   MRN: [id number]  Chief Complaint  Patient presents with   Melena    HPI Gregory Pearson is a 89 yrs old male seen today 11/18/2023 for an acute visit concern s for black tarry stool. A 89 year old male presents for an acute visit due to concerns of melena. He has a history of gastrointestinal issues and reports black, tarry stools for the past five days. He experienced a brief episode of lightheadedness yesterday, but otherwise, he feels normal. No reported abdominal pain, nausea, or vomiting. Active Ambulatory Problems    Diagnosis Date Noted   Thrombocytopenia (HCC) 01/24/2014   Hyperlipidemia 07/11/2015   Venous stasis 05/17/2016   Melena    History of GI bleed    History of gastric ulcer 08/11/2019   History of prostatectomy 05/10/2022   Osteoarthritis of knees, bilateral 12/05/2021   Chronic back pain 11/19/2022   Frequent falls 11/19/2022   Systolic murmur 04/07/2023   Resolved Ambulatory Problems    Diagnosis Date Noted   Hyperlipemia 06/29/2013   Hip injury 01/23/2014   Femur fracture, right (HCC) 01/23/2014   Muscle strain of right gluteal region 01/24/2014   Traumatic hematoma of buttock 01/24/2014   Healthcare maintenance 07/11/2015   GI bleeding 09/25/2016   Hyperglycemia 09/25/2016   GI bleed 09/27/2016   GI bleed 07/06/2019   Gastric ulcer 08/11/2019   Bilateral knee pain 12/08/2019   Hematuria 07/09/2021   Acute pain of right shoulder 07/09/2021   E Coli UTI (urinary tract infection) 04/26/2022   Sepsis secondary to UTI (HCC) 04/26/2022   Sepsis due to E Coli 04/27/2022   E coli bacteremia 04/27/2022   Past Medical History:  Diagnosis Date   Asbestos exposure    Blood transfusion without reported diagnosis    Diverticulosis    GERD (gastroesophageal reflux disease)    History of stomach ulcers 1995   History of stomach ulcers    Prostate cancer (HCC)  2002    Review of Systems  Constitutional:  Negative for chills and fever.  HENT:  Negative for congestion and sore throat.   Respiratory:  Negative for cough and shortness of breath.   Cardiovascular:  Negative for chest pain and leg swelling.  Gastrointestinal:  Positive for melena. Negative for abdominal pain, nausea and vomiting.  Musculoskeletal:  Negative for falls.  Skin:  Negative for itching and rash.  Neurological:  Positive for dizziness. Negative for weakness and headaches.       Yesterday for brief moment   Negative unless indicated in HPI    Objective:    BP 128/78   Pulse 95   Temp 98.4 F (36.9 C)   Ht 6' (1.829 m)   Wt 206 lb 3.2 oz (93.5 kg)   SpO2 96%   BMI 27.97 kg/m  BP Readings from Last 3 Encounters:  11/18/23 128/78  10/08/23 125/72  08/08/23 139/82   Wt Readings from Last 3 Encounters:  11/18/23 206 lb 3.2 oz (93.5 kg)  10/08/23 213 lb (96.6 kg)  10/07/23 211 lb (95.7 kg)      Physical Exam Vitals and nursing note reviewed.  Constitutional:      General: He is not in acute distress. HENT:     Head: Normocephalic and atraumatic.     Nose: Nose normal.     Mouth/Throat:     Mouth: Mucous membranes are moist.  Eyes:     General: No scleral icterus.    Extraocular Movements: Extraocular movements intact.     Conjunctiva/sclera: Conjunctivae normal.     Pupils: Pupils are equal, round, and reactive to light.  Cardiovascular:     Heart sounds: Normal heart sounds.  Pulmonary:     Effort: Pulmonary effort is normal.     Breath sounds: Normal breath sounds.  Abdominal:     General: Bowel sounds are normal.     Palpations: Abdomen is soft.     Tenderness: There is no abdominal tenderness. There is no rebound.  Genitourinary:    Comments: + melena Musculoskeletal:     Right lower leg: No edema.     Left lower leg: No edema.  Skin:    General: Skin is warm and dry.     Capillary Refill: Capillary refill takes less than 2 seconds.      Findings: No rash.  Neurological:     Mental Status: He is alert and oriented to person, place, and time.  Psychiatric:        Mood and Affect: Mood normal.        Behavior: Behavior normal.        Thought Content: Thought content normal.        Judgment: Judgment normal.     No results found for any visits on 11/18/23.      Assessment & Plan:  History of gastric ulcer -     CBC with Differential/Platelet  Melena -     CBC with Differential/Platelet -     Fecal Occult Blood, Guaiac  Gregory Pearson is a 89 yrs old caucasian male seen for melena/possible GI bleed, no acute distress -Obtain fecal occult blood test (FOBT) results pending -Order CBC to assess for anemia results pending -Given his history and symptoms, refer to the Emergency Department to rule out an active GI bleed  The above assessment and management plan was discussed with the patient. The patient verbalized understanding of and has agreed to the management plan. Patient is aware to call the clinic if they develop any new symptoms or if symptoms persist or worsen. Patient is aware when to return to the clinic for a follow-up visit. Patient educated on when it is appropriate to go to the emergency department.  Return if symptoms worsen or fail to improve.  Arrie Aran Santa Lighter, [facility] [address] [place], [place] [address] [phone]  Note: This document was prepared by [employer] voice dictation technology and any errors that results from this process are unintentional.

## 2023-11-18 NOTE — ED Provider Notes (Signed)
 [place] EMERGENCY DEPARTMENT AT [facility] Provider Note   CSN: [id number] Arrival date & time: 11/18/23  1340     History {Add pertinent medical, surgical, social history, OB history to HPI:1} Chief Complaint  Patient presents with   Melena    Gregory Pearson is a 89 y.o. male.  HPI     Home Medications Prior to Admission medications   Medication Sig Start Date End Date Taking? Authorizing Provider  acetaminophen (TYLENOL) 500 MG tablet Take 1 tablet (500 mg total) by mouth every 6 (six) hours as needed. Patient taking differently: Take 500 mg by mouth every 6 (six) hours as needed for fever or mild pain (pain score 1-3). 11/14/21   Daryll Drown, NP  fluticasone (FLONASE) 50 MCG/ACT nasal spray Place 1 spray into both nostrils 2 (two) times daily as needed for allergies or rhinitis. 10/08/23   Dettinger, Elige Radon, MD  Multiple Vitamin (MULTIVITAMIN) tablet Take 1 tablet by mouth daily.    [provider]  mupirocin ointment (BACTROBAN) 2 % Apply 1 Application topically 2 (two) times daily. 08/08/23   Dettinger, Elige Radon, MD  nabumetone (RELAFEN) 500 MG tablet TAKE 2 TABLETS BY MOUTH TWICE  DAILY FOR MUSCLE AND JOINT PAIN 03/17/23   Dettinger, Elige Radon, MD  sertraline (ZOLOFT) 50 MG tablet Take 1 tablet (50 mg total) by mouth daily. 11/04/23   Dettinger, Elige Radon, MD  traZODone (DESYREL) 100 MG tablet Take 1 tablet (100 mg total) by mouth at bedtime as needed. for sleep 10/08/23   Dettinger, Elige Radon, MD      Allergies    Patient has no known allergies.    Review of Systems   Review of Systems  Physical Exam Updated Vital Signs BP 101/69   Pulse 77   Temp 98.2 F (36.8 C) (Oral)   Resp 18   Ht 6' (1.829 m)   Wt 93 kg   SpO2 92%   BMI 27.81 kg/m  Physical Exam  ED Results / Procedures / Treatments   Labs (all labs ordered are listed, but only abnormal results are displayed) Labs Reviewed  COMPREHENSIVE METABOLIC PANEL - Abnormal; Notable for  the following components:      Result Value   Glucose, Bld 107 (*)    BUN 45 (*)    Calcium 8.8 (*)    Total Protein 5.9 (*)    All other components within normal limits  CBC WITH DIFFERENTIAL/PLATELET - Abnormal; Notable for the following components:   RBC 3.83 (*)    Hemoglobin 12.4 (*)    HCT 37.9 (*)    Platelets 138 (*)    All other components within normal limits  POC OCCULT BLOOD, ED - Abnormal  PROTIME-INR  TYPE AND SCREEN    EKG None  Radiology No results found.  Procedures Procedures  {Document cardiac monitor, telemetry assessment procedure when appropriate:1}  Medications Ordered in ED Medications  sodium chloride 0.9 % bolus 500 mL (has no administration in time range)  pantoprazole (PROTONIX) injection 40 mg (40 mg Intravenous Given 11/18/23 1552)    ED Course/ Medical Decision Making/ A&P   {   Click here for ABCD2, HEART and other calculatorsREFRESH Note before signing :1}                              Medical Decision Making Amount and/or Complexity of Data Reviewed Labs: ordered.  Risk Prescription drug management. Decision  regarding hospitalization.   This patient presents to the ED with chief complaint(s) of dark, tarry stool x 1 episode with pertinent past medical history of previous history of PUD.The complaint involves an extensive differential diagnosis and also carries with it a high risk of complications and morbidity.    The differential diagnosis includes : DDx includes: Esophagitis Mallory Weiss tear Variceal bleeding PUD/Gastritis/ulcers Diverticular bleed Colon cancer Rectal bleed  The initial plan is to get basic labs.  I reviewed patient's previous workup including several upper endoscopies.  The last endoscopy from 2 years ago revealed healing ulcer scar.  Patient is not on any anticoagulation.  He does have a history of thrombocytopenia, which is not new.   Additional history obtained: Records reviewed previous  admission documents and previous GI note.  Independent labs interpretation:  The following labs were independently interpreted: Hemoglobin has dropped from 14-12.4.  Platelet count low, but that is chronic.   Treatment and Reassessment: Results of the ED workup discussed with the patient.  He is made aware that we will admit him to the hospital.  Consultation: - Consulted or discussed management/test interpretation with external professional: Dr. Levon Hedger, the will see the patient tomorrow.  Final Clinical Impression(s) / ED Diagnoses Final diagnoses:  Melena    Rx / DC Orders ED Discharge Orders     None

## 2023-11-18 NOTE — Progress Notes (Signed)
   11/18/23 2055  TOC Brief Assessment  Insurance and Status Reviewed  Patient has primary care physician Yes  Home environment has been reviewed From home.  Prior level of function: Independent  Prior/Current Home Services No current home services  Social Drivers of Health Review SDOH reviewed no interventions necessary  Readmission risk has been reviewed Yes  Transition of care needs no transition of care needs at this time   Pt c/history of ulcers admitted for upper GI bleed. Scheduled endoscopy 11/19/23. Plans to dc home. TOC to follow.

## 2023-11-18 NOTE — Consult Note (Signed)
 @LOGO @   Referring Provider: Bethann Berkshire, MD Primary Care Physician:  Dettinger, Elige Radon, MD Primary Gastroenterologist:  Dr. Jena Gauss  Date of Admission: 11/18/23 Date of Consultation: 11/18/23  Reason for Consultation:  Melena  HPI:  Gregory Pearson is a 89 y.o. year old male with history of arthritis, HLD, GERD, GI bleed in the setting of peptic ulcer disease suspected to be NSAID related, presenting to the emergency room today with chief complaint of black tarry stools that started earlier this morning.  ED course: Initially hypertensive with BP 140/76.  BP declined to 101/69, heart rate initially 95 > 77, SpO2 96%>> 92%. He was found to have a hemoglobin of 12.4, down from 14.3, 1 month ago.  Fecal occult blood positive.  BUN elevated at 45. He was given IV PPI, 500 cc fluid bolus, and GI consulted for further evaluation.  Consult:  Reports single loose black stool around 9:30 this morning. This is the first time this has occurred in a few years. No brbpr.  Denies NSAIDs.  States that he takes Tylenol only.  Does not take a PPI.  Denies abdominal pain, nausea, vomiting, reflux, dysphagia, unintentional weight loss, or other bowel issues.  In general, patein remains very active in his daily life going on trips with his church etc. Plans to go on a cruise in May.    Last EGD 10/20/2019: Minimally retained gastric contents, stomach incompletely seen, previously noted gastric ulcer completely healed with scar present.  Recommended discontinuing Protonix as long as no NSAIDs.   Past Medical History:  Diagnosis Date   Asbestos exposure    Blood transfusion without reported diagnosis    after ruptured appendix 1963   Diverticulosis    Gastric ulcer 06/2019   GERD (gastroesophageal reflux disease)    History of stomach ulcers 1995   H.pylori s/p treatment   History of stomach ulcers    Hyperlipidemia    Muscle strain of right gluteal region 01/24/2014   Prostate cancer (HCC) 2002    prostatectomy   Traumatic hematoma of buttock 01/24/2014    Past Surgical History:  Procedure Laterality Date   APPENDECTOMY  1963   ruptured appendix/gangrene   BIOPSY  07/06/2019   Procedure: BIOPSY;  Surgeon: Corbin Ade, MD;  Location: [facility];  Service: Endoscopy;;   CATARACT EXTRACTION W/PHACO Left 04/04/2023   Procedure: CATARACT EXTRACTION PHACO AND INTRAOCULAR LENS PLACEMENT (IOC);  Surgeon: Pecolia Ades, MD;  Location: [facility];  Service: Ophthalmology;  Laterality: Left;  CDE  5.20   CATARACT EXTRACTION W/PHACO Right 04/18/2023   Procedure: CATARACT EXTRACTION PHACO AND INTRAOCULAR LENS PLACEMENT (IOC);  Surgeon: Pecolia Ades, MD;  Location: [facility];  Service: Ophthalmology;  Laterality: Right;  CDE 12.07   COLONOSCOPY  2013   Dr. Russella Dar: multiple colon polyps (tubular adenomas), diverticulosis. no further surveillance colonoscopies due to age.   ESOPHAGOGASTRODUODENOSCOPY  [id number]   Dr. Russella Dar: gastric ulcer (h.pylori + s/p tx), follow up EGD verified ulcer healing   ESOPHAGOGASTRODUODENOSCOPY N/A 09/26/2016   Procedure: ESOPHAGOGASTRODUODENOSCOPY (EGD);  Surgeon: Corbin Ade, MD;  Location: [facility];  Service: Endoscopy;  Laterality: N/A;   ESOPHAGOGASTRODUODENOSCOPY N/A 01/23/2017   Procedure: ESOPHAGOGASTRODUODENOSCOPY (EGD);  Surgeon: Corbin Ade, MD;  Location: [facility];  Service: Endoscopy;  Laterality: N/A;  12:15pm   ESOPHAGOGASTRODUODENOSCOPY N/A 07/06/2019   Procedure: ESOPHAGOGASTRODUODENOSCOPY (EGD);  Surgeon: Corbin Ade, MD; normal esophagus, nonbleeding gastric ulcer with pigmented material, abnormal gastric mucosa of uncertain significance s/p biopsy,  normal duodenum.  Pathology with mild reactive gastropathy, mild chronic gastritis, H. pylori negative.   ESOPHAGOGASTRODUODENOSCOPY N/A 10/20/2019   Procedure: ESOPHAGOGASTRODUODENOSCOPY (EGD);  Surgeon: Corbin Ade, MD;  Location: [facility];  Service: Endoscopy;  Laterality:  N/A;  9:30am   HEMORRHOID SURGERY  1970   LAPAROSCOPIC CHOLECYSTECTOMY  2003   PROSTATECTOMY  2002    Prior to Admission medications   Medication Sig Start Date End Date Taking? Authorizing Provider  acetaminophen (TYLENOL) 500 MG tablet Take 1 tablet (500 mg total) by mouth every 6 (six) hours as needed. Patient taking differently: Take 500 mg by mouth every 6 (six) hours as needed for fever or mild pain (pain score 1-3). 11/14/21   Daryll Drown, NP  fluticasone (FLONASE) 50 MCG/ACT nasal spray Place 1 spray into both nostrils 2 (two) times daily as needed for allergies or rhinitis. 10/08/23   Dettinger, Elige Radon, MD  Multiple Vitamin (MULTIVITAMIN) tablet Take 1 tablet by mouth daily.    [provider]  mupirocin ointment (BACTROBAN) 2 % Apply 1 Application topically 2 (two) times daily. 08/08/23   Dettinger, Elige Radon, MD  nabumetone (RELAFEN) 500 MG tablet TAKE 2 TABLETS BY MOUTH TWICE  DAILY FOR MUSCLE AND JOINT PAIN 03/17/23   Dettinger, Elige Radon, MD  sertraline (ZOLOFT) 50 MG tablet Take 1 tablet (50 mg total) by mouth daily. 11/04/23   Dettinger, Elige Radon, MD  traZODone (DESYREL) 100 MG tablet Take 1 tablet (100 mg total) by mouth at bedtime as needed. for sleep 10/08/23   Dettinger, Elige Radon, MD    Current Facility-Administered Medications  Medication Dose Route Frequency Provider Last Rate Last Admin   pantoprazole (PROTONIX) injection 40 mg  40 mg Intravenous Once Bethann Berkshire, MD       sodium chloride 0.9 % bolus 500 mL  500 mL Intravenous Once Bethann Berkshire, MD       Current Outpatient Medications  Medication Sig Dispense Refill   acetaminophen (TYLENOL) 500 MG tablet Take 1 tablet (500 mg total) by mouth every 6 (six) hours as needed. (Patient taking differently: Take 500 mg by mouth every 6 (six) hours as needed for fever or mild pain (pain score 1-3).) 30 tablet 0   fluticasone (FLONASE) 50 MCG/ACT nasal spray Place 1 spray into both nostrils 2 (two) times daily  as needed for allergies or rhinitis. 16 g 6   Multiple Vitamin (MULTIVITAMIN) tablet Take 1 tablet by mouth daily.     mupirocin ointment (BACTROBAN) 2 % Apply 1 Application topically 2 (two) times daily. 22 g 1   nabumetone (RELAFEN) 500 MG tablet TAKE 2 TABLETS BY MOUTH TWICE  DAILY FOR MUSCLE AND JOINT PAIN 360 tablet 3   sertraline (ZOLOFT) 50 MG tablet Take 1 tablet (50 mg total) by mouth daily. 90 tablet 1   traZODone (DESYREL) 100 MG tablet Take 1 tablet (100 mg total) by mouth at bedtime as needed. for sleep 90 tablet 3    Allergies as of 11/18/2023   (No Known Allergies)    Family History  Problem Relation Age of Onset   Prostate cancer Brother    Prostate cancer Brother    Suicidality Brother    Prostate cancer Brother    Stomach cancer Neg Hx    Colon cancer Neg Hx     Social History   Socioeconomic History   Marital status: Widowed    Spouse name: Not on file   Number of children: 3   Years  of education: Not on file   Highest education level: Not on file  Occupational History   Occupation: retired Psychiatric nurse power  Tobacco Use   Smoking status: Never   Smokeless tobacco: Never  Vaping Use   Vaping status: Never Used  Substance and Sexual Activity   Alcohol use: Not Currently    Comment: last drank about 15 years ago   Drug use: Never   Sexual activity: Not on file  Other Topics Concern   Not on file  Social History Narrative   3 children.   Widowed since 01/16/2021. Married x 61 years.    Social Drivers of Corporate investment banker Strain: Low Risk  (10/07/2023)   Overall Financial Resource Strain (CARDIA)    Difficulty of Paying Living Expenses: Not hard at all  Food Insecurity: No Food Insecurity (10/07/2023)   Hunger Vital Sign    Worried About Running Out of Food in the Last Year: Never true    Ran Out of Food in the Last Year: Never true  Transportation Needs: No Transportation Needs (10/07/2023)   PRAPARE - Administrator, Civil Service  (Medical): No    Lack of Transportation (Non-Medical): No  Physical Activity: Insufficiently Active (10/07/2023)   Exercise Vital Sign    Days of Exercise per Week: 5 days    Minutes of Exercise per Session: 20 min  Stress: No Stress Concern Present (10/07/2023)   [employer] of Occupational Health - Occupational Stress Questionnaire    Feeling of Stress : Not at all  Social Connections: Moderately Integrated (10/07/2023)   Social Connection and Isolation Panel [NHANES]    Frequency of Communication with Friends and Family: More than three times a week    Frequency of Social Gatherings with Friends and Family: Three times a week    Attends Religious Services: More than 4 times per year    Active Member of Clubs or Organizations: Yes    Attends Banker Meetings: 1 to 4 times per year    Marital Status: Widowed  Intimate Partner Violence: Not At Risk (10/07/2023)   Humiliation, Afraid, Rape, and Kick questionnaire    Fear of Current or Ex-Partner: No    Emotionally Abused: No    Physically Abused: No    Sexually Abused: No    Review of Systems: Gen: Denies fever, chills, cold or flulike symptoms, presyncope, syncope. CV: Denies chest pain, heart palpitations.  Resp: Denies shortness of breath, cough.  GI: See HPI GU : Denies urinary burning, urinary frequency, urinary incontinence.  MS: Admits to bilateral knee pain related to arthritis. Derm: Denies rash.  Psych: Denies depression, anxiety.  Heme: See HPI  Physical Exam: Vital signs in last 24 hours: Temp:  [98.2 F (36.8 C)-98.4 F (36.9 C)] 98.2 F (36.8 C) (03/25 1536) Pulse Rate:  [77-95] 77 (03/25 1515) Resp:  [18] 18 (03/25 1515) BP: (101-149)/(69-80) 101/69 (03/25 1515) SpO2:  [92 %-96 %] 92 % (03/25 1515) Weight:  [93 kg-93.5 kg] 93 kg (03/25 1400)   General:   Alert,  Well-developed, well-nourished, pleasant and cooperative in NAD Head:  Normocephalic and atraumatic. Eyes:  Sclera clear, no  icterus.   Conjunctiva pink. Ears:  Normal auditory acuity. Nose:  No deformity, discharge,  or lesions. Lungs:  Clear throughout to auscultation.   No wheezes, crackles, or rhonchi. No acute distress. Heart:  Regular rate and rhythm/ Systolic murmur appreciated.  Abdomen:  Soft, nontender and nondistended. No masses, hepatosplenomegaly or hernias  noted. Normal bowel sounds, without guarding, and without rebound.   Rectal:  Deferred  Msk:  Symmetrical without gross deformities. Normal posture. Extremities:  With 1 + bilateral LE edema.  Neurologic:  Alert and  oriented x4;  grossly normal neurologically. Skin:  Intact without significant lesions or rashes. Psych:  Normal mood and affect.  Intake/Output from previous day: No intake/output data recorded. Intake/Output this shift: No intake/output data recorded.  Lab Results: Recent Labs    11/18/23 1448  WBC 6.3  HGB 12.4*  HCT 37.9*  PLT 138*   BMET Recent Labs    11/18/23 1448  NA 136  K 4.3  CL 101  CO2 24  GLUCOSE 107*  BUN 45*  CREATININE 0.70  CALCIUM 8.8*   LFT Recent Labs    11/18/23 1448  PROT 5.9*  ALBUMIN 3.5  AST 22  ALT 20  ALKPHOS 53  BILITOT 0.7   PT/INR Recent Labs    11/18/23 1448  LABPROT 13.8  INR 1.0     Impression: 89 y.o. year old male with history of arthritis, HLD, GERD, GI bleed in the setting of peptic ulcer disease suspected to be NSAID related with last EGD in 2021 showing prior ulcer completely healed and no recommendations to continue daily PPI as long as patient avoided NSAIDs, now presenting to the ER today following a single episode of melena. He was found to have a drop in his Hgb from 14.3, 1 month ago, to 12.4, FOBT positive. BUN also elevated at 45. Denies any other significant GI symptoms. Denies NSAIDs.   Presentation is concerning for PUD. Unable to rule out other etiologies such as AVMs. Dieulafoy lesion, and less likely malignancy.   Plan: Clear liquid diet  today.  NPO at midnight except for sips with meds.  IV PPI BID.  Proceed with upper endoscopy with propofol with Dr. Jena Gauss tomorrow. The risks, benefits, and alternatives have been discussed with the patient in detail. The patient states understanding and desires to proceed. Continue to monitor H/H and for overt GI bleeding.      LOS: 0 days    11/18/2023, 3:47 PM   Ermalinda Memos, [facility]

## 2023-11-18 NOTE — ED Triage Notes (Signed)
 Pt in by POV. C/o of black tary stools that started this AM. Hx of bleeding ulcers per pt.

## 2023-11-19 ENCOUNTER — Encounter (HOSPITAL_COMMUNITY)
Admission: EM | Disposition: A | Discharge status: Home / Self Care | Payer: Self-pay | Source: Home / Self Care | Attending: Family Medicine

## 2023-11-19 ENCOUNTER — Inpatient Hospital Stay (HOSPITAL_COMMUNITY): Admitting: Anesthesiology

## 2023-11-19 ENCOUNTER — Encounter (HOSPITAL_COMMUNITY): Payer: Self-pay | Admitting: Internal Medicine

## 2023-11-19 DIAGNOSIS — E785 Hyperlipidemia, unspecified: Secondary | ICD-10-CM | POA: Diagnosis not present

## 2023-11-19 DIAGNOSIS — K259 Gastric ulcer, unspecified as acute or chronic, without hemorrhage or perforation: Secondary | ICD-10-CM

## 2023-11-19 DIAGNOSIS — K921 Melena: Secondary | ICD-10-CM | POA: Diagnosis not present

## 2023-11-19 DIAGNOSIS — I10 Essential (primary) hypertension: Secondary | ICD-10-CM | POA: Diagnosis not present

## 2023-11-19 HISTORY — PX: ESOPHAGOGASTRODUODENOSCOPY: SHX5428

## 2023-11-19 LAB — CBC WITH DIFFERENTIAL/PLATELET
Basophils Absolute: 0 10*3/uL (ref 0.0–0.2)
Basos: 1 %
EOS (ABSOLUTE): 0.1 10*3/uL (ref 0.0–0.4)
Eos: 1 %
Hematocrit: 39.3 % (ref 37.5–51.0)
Hemoglobin: 13.3 g/dL (ref 13.0–17.7)
Immature Grans (Abs): 0 10*3/uL (ref 0.0–0.1)
Immature Granulocytes: 0 %
Lymphocytes Absolute: 1.2 10*3/uL (ref 0.7–3.1)
Lymphs: 16 %
MCH: 32.6 pg (ref 26.6–33.0)
MCHC: 33.8 g/dL (ref 31.5–35.7)
MCV: 96 fL (ref 79–97)
Monocytes Absolute: 0.6 10*3/uL (ref 0.1–0.9)
Monocytes: 8 %
Neutrophils Absolute: 5.4 10*3/uL (ref 1.4–7.0)
Neutrophils: 74 %
Platelets: 149 10*3/uL — ABNORMAL LOW (ref 150–450)
RBC: 4.08 x10E6/uL — ABNORMAL LOW (ref 4.14–5.80)
RDW: 12.8 % (ref 11.6–15.4)
WBC: 7.4 10*3/uL (ref 3.4–10.8)

## 2023-11-19 LAB — CBC
HCT: 34.3 % — ABNORMAL LOW (ref 39.0–52.0)
Hemoglobin: 11.4 g/dL — ABNORMAL LOW (ref 13.0–17.0)
MCH: 32.9 pg (ref 26.0–34.0)
MCHC: 33.2 g/dL (ref 30.0–36.0)
MCV: 98.8 fL (ref 80.0–100.0)
Platelets: 114 10*3/uL — ABNORMAL LOW (ref 150–400)
RBC: 3.47 MIL/uL — ABNORMAL LOW (ref 4.22–5.81)
RDW: 13.9 % (ref 11.5–15.5)
WBC: 5.1 10*3/uL (ref 4.0–10.5)
nRBC: 0 % (ref 0.0–0.2)

## 2023-11-19 LAB — BASIC METABOLIC PANEL
Anion gap: 6 (ref 5–15)
BUN: 29 mg/dL — ABNORMAL HIGH (ref 8–23)
CO2: 26 mmol/L (ref 22–32)
Calcium: 8.1 mg/dL — ABNORMAL LOW (ref 8.9–10.3)
Chloride: 108 mmol/L (ref 98–111)
Creatinine, Ser: 0.59 mg/dL — ABNORMAL LOW (ref 0.61–1.24)
GFR, Estimated: >60 mL/min (ref >60)
Glucose, Bld: 89 mg/dL (ref 70–99)
Potassium: 3.6 mmol/L (ref 3.5–5.1)
Sodium: 140 mmol/L (ref 135–145)

## 2023-11-19 LAB — FECAL OCCULT BLOOD, IMMUNOCHEMICAL: Fecal Occult Bld: POSITIVE — AB

## 2023-11-19 LAB — PROTIME-INR
INR: 1.1 (ref 0.8–1.2)
Prothrombin Time: 14 s (ref 11.4–15.2)

## 2023-11-19 SURGERY — EGD (ESOPHAGOGASTRODUODENOSCOPY)
Anesthesia: General

## 2023-11-19 MED ORDER — PROPOFOL 500 MG/50ML IV EMUL
INTRAVENOUS | Status: DC | PRN
  Administered 2023-11-19: 120 ug/kg/min via INTRAVENOUS

## 2023-11-19 MED ORDER — PHENYLEPHRINE 80 MCG/ML (10ML) SYRINGE FOR IV PUSH (FOR BLOOD PRESSURE SUPPORT)
PREFILLED_SYRINGE | INTRAVENOUS | Status: DC | PRN
  Administered 2023-11-19: 100 ug via INTRAVENOUS

## 2023-11-19 MED ORDER — PROPOFOL 10 MG/ML IV BOLUS
INTRAVENOUS | Status: DC | PRN
  Administered 2023-11-19 (×2): 20 mg via INTRAVENOUS
  Administered 2023-11-19: 70 mg via INTRAVENOUS

## 2023-11-19 MED ORDER — LIDOCAINE HCL (CARDIAC) PF 100 MG/5ML IV SOSY
PREFILLED_SYRINGE | INTRAVENOUS | Status: DC | PRN
  Administered 2023-11-19: 50 mg via INTRATRACHEAL

## 2023-11-19 NOTE — Progress Notes (Signed)
 Mobility Specialist Progress Note:    11/19/23 1044  Mobility  Activity Transferred from bed to chair  Level of Assistance Contact guard assist, steadying assist  Assistive Device Four wheel walker  Distance Ambulated (ft) 5 ft  Range of Motion/Exercises Active;All extremities  Activity Response Tolerated well  Mobility Referral Yes  Mobility visit 1 Mobility  Mobility Specialist Start Time (ACUTE ONLY) 1005  Mobility Specialist Stop Time (ACUTE ONLY) 1030  Mobility Specialist Time Calculation (min) (ACUTE ONLY) 25 min   Pt received in bed, eager for mobility. Required CGA to stand and ambulate with RW. Tolerated well, asx throughout. Left pt in chair, alarm on. Call bell in reach, all needs met.  Lawerance Bach Mobility Specialist Please contact via Special educational needs teacher or  Rehab office at [phone]

## 2023-11-19 NOTE — Progress Notes (Signed)
 [facility] PROGRESS NOTE  Dayven Linsley (DOB: Jan 19, 1928) NGE:[id number] PCP: Dettinger, Elige Radon, MD  Brief Narrative: Sukhdeep Wieting is a 89 y.o. male with a history of NSAID-induced PUD with GI bleeding who presented to the ED on 11/18/2023 with an episode of melena, found to have hgb down from 14.3 to 12.4 with elevated BUN. Vitals were stable and no brisk bleeding was noted. He was admitted, taken for EGD on 3/26 showing an 8mm gastric ulcer (Forrest class 2B), suspected culprit. He is to be monitored overnight for stability prior to being eligible to discharge back home.  Subjective: Wants to eat/drink but knows not to. Has no abdominal pain, no BM today.   Objective: BP 112/80 (BP Location: Right Arm)   Pulse 77   Temp 98.2 F (36.8 C)   Resp 18   Ht 6' (1.829 m)   Wt 93 kg   SpO2 99%   BMI 27.81 kg/m   Gen: Elderly but well-appearing male in no distress Pulm: Clear, nonlabored  CV: RRR with III/VI SEM at the base, compression stockings on.  GI: Soft, NT, ND, +BS Neuro: Alert and oriented. No new focal deficits. Ext: Warm, no deformities. Skin: No rashes, lesions or ulcers on visualized skin   EGD Dr. Jena Gauss 11/19/2023: Impression:        - Single 8 mm stellate shaped gastric ulcer -                            Forrest class 2B; likely source of GI bleed.                            Stigmata consistent with low risk of rebleed.                            Gastric biopsy for H. pylori.                           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 Did have H. pylori                            previously and was treated - eradication was proven                            in the distant past.                           Patient prescribed Relafen previously. He tells me                            he takes medication for arthritis, 2 tablets twice                            daily, which will be consistent with  Relafen  dosing. States he has it in his pickup truck in the                            parking lot.  Recommendation: - Advance diet as tolerated. Protonix 40 mg orally                            twice daily for the next 12 weeks. Patient needs                            absolutely avoid all NSAIDs. I have discussed my                            findings and recommendations at length with his son                            Ervey Fallin [phone]). His questions were                            answered. I asked son to retrieve the medication in                            patient's truck so we can identify.                           -Given advanced age, would monitor in the hospital                            another 24 hours and plan to discharge tomorrow                            morning so long as he remains stable. Follow-up on                            pathology. Recommend repeat EGD in 12 weeks to                            assess for healing.                           - Return patient to hospital ward for observation.  Assessment & Plan: GI bleeding due to gastric ulcer: Seen on EGD 3/26. Pt may have inadvertently been taking NSAID in the form of relafen  - DC relafen and all NSAIDs - Continue PPI BID x12 weeks - Follow up pathology report from EGD 3/26 - Repeat EGD for surveillance in ~12 weeks.   ABLA:  - Recheck CBC in AM or prn bleeding.   Arthritis:  - Continue tylenol and mobility  Depression: Quiescent.  - Continue SSRI  Tyrone Nine, MD [facility] [email or web address] 11/19/2023, 2:30 PM

## 2023-11-19 NOTE — Interval H&P Note (Signed)
 History and Physical Interval Note:  11/19/2023 12:07 PM  Gregory Pearson  has presented today for surgery, with the diagnosis of Melena, anemia.  The various methods of treatment have been discussed with the patient and family. After consideration of risks, benefits and other options for treatment, the patient has consented to  Procedure(s): EGD (ESOPHAGOGASTRODUODENOSCOPY) (N/A) as a surgical intervention.  The patient's history has been reviewed, patient examined, no change in status, stable for surgery.  I have reviewed the patient's chart and labs.  Questions were answered to the patient's satisfaction.     [name]   Patient seen and examined in short stay.  Hemodynamically stable overnight hemoglobin 11.4 this morning without transfusion since admission.  Agree with need for diagnostic EGD.  Denies dysphagia. The risks, benefits, limitations, alternatives and imponderables have been reviewed with the patient. Potential for esophageal dilation, biopsy, etc. have also been reviewed.  Questions have been answered. All parties agreeable.

## 2023-11-19 NOTE — Op Note (Addendum)
 [facility] Patient Name: Gregory Pearson Procedure Date: 11/19/2023 11:54 AM MRN: [id number] Date of Birth: [birth year] Attending MD: Gennette Pac , MD, [id number] CSN: [id number] Age: 89 Admit Type: Inpatient Procedure:                Upper GI endoscopy Indications:              Melena Providers:                Gennette Pac, MD, Angelica Ran, Crystal Page Referring MD:              Medicines:                Propofol per Anesthesia Complications:            No immediate complications. Estimated Blood Loss:     Estimated blood loss was minimal. Procedure:                Pre-Anesthesia Assessment:                           - Prior to the procedure, a History and Physical                            was performed, and patient medications and                            allergies were reviewed. The patient's tolerance of                            previous anesthesia was also reviewed. The risks                            and benefits of the procedure and the sedation                            options and risks were discussed with the patient.                            All questions were answered, and informed consent                            was obtained. Prior Anticoagulants: The patient has                            taken no anticoagulant or antiplatelet agents. ASA                            Grade Assessment: III - A patient with severe                            systemic disease. After reviewing the risks and                            benefits, the patient was deemed in satisfactory  condition to undergo the procedure.                           After obtaining informed consent, the endoscope was                            passed under direct vision. Throughout the                            procedure, the patient's blood pressure, pulse, and                            oxygen saturations were monitored continuously. The                             [id number]-H190 ([id number]) scope was introduced through the                            mouth, and advanced to the second part of duodenum. Scope In: 12:25:44 PM Scope Out: 12:35:26 PM Total Procedure Duration: 0 hours 9 minutes 42 seconds  Findings:      Esophagus normal except for an irregular jagged the Z-line.      Stomach empty. (1) 8 mm stellate shaped ulcer in the antrum. Flat       pigmented ulcer crater -Forrest 2b; remainder of gastric mucosa appeared       normal. Pylorus patent.      The duodenal bulb and second portion of the duodenum were normal. Biopsy       of the gastric mucosa taken. Ulcer was not biopsied. Impression:               - Single 8 mm stellate shaped gastric ulcer -                            Forrest class 2B; likely source of GI bleed.                            Stigmata consistent with low risk of rebleed.                            Gastric biopsy for H. pylori.                           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 Did have H. pylori                            previously and was treated - eradication was proven                            in the distant past.  Patient prescribed Relafen previously. He tells me                            he takes medication for arthritis, 2 tablets twice                            daily, which will be consistent with Relafen                            dosing. States he has it in his pickup truck in the                            parking lot. Moderate Sedation:      Moderate (conscious) sedation was personally administered by an       anesthesia professional. The following parameters were monitored: oxygen       saturation, heart rate, blood pressure, and response to care. Recommendation:           - Advance diet as tolerated. Protonix 40 mg orally                            twice daily for the next 12 weeks. Patient needs                             absolutely avoid all NSAIDs. I have discussed my                            findings and recommendations at length with his son                            Dustin Bumbaugh [phone]). His questions were                            answered. I asked son to retrieve the medication in                            patient's truck so we can identify.                           -Given advanced age, would monitor in the hospital                            another 24 hours and plan to discharge tomorrow                            morning so long as he remains stable. Follow-up on                            pathology. Recommend repeat EGD in 12 weeks to                            assess for healing.                           -  Return patient to hospital ward for observation. Procedure Code(s):        --- Professional ---                           [id number], Esophagogastroduodenoscopy, flexible,                            transoral; diagnostic, including collection of                            specimen(s) by brushing or washing, when performed                            (separate procedure) Diagnosis Code(s):        --- Professional ---                           K92.1, Melena (includes Hematochezia) CPT copyright 2022 American Medical Association. All rights reserved. The codes documented in this report are preliminary and upon coder review may  be revised to meet current compliance requirements. Gerrit Friends. Glenisha Gundry, MD Gennette Pac, MD 11/19/2023 12:52:25 PM This report has been signed electronically. Number of Addenda: 0

## 2023-11-19 NOTE — Progress Notes (Signed)
 Patient returned from PACU in stable condition     11/19/23 1310  Vitals  Temp 98 F (36.7 C)  BP 125/73  MAP (mmHg) 89  Pulse Rate 67  Resp 20  Oxygen Therapy  SpO2 97 %  O2 Device Room Air

## 2023-11-19 NOTE — Transfer of Care (Signed)
 Immediate Anesthesia Transfer of Care Note  Patient: Emmet Brodbeck  Procedure(s) Performed: EGD (ESOPHAGOGASTRODUODENOSCOPY)  Patient Location: PACU  Anesthesia Type:General  Level of Consciousness: awake  Airway & Oxygen Therapy: Patient Spontanous Breathing  Post-op Assessment: Report given to RN  Post vital signs: Reviewed and stable  Last Vitals:  Vitals Value Taken Time  BP    Temp    Pulse    Resp    SpO2      Last Pain:  Vitals:   11/19/23 1222  TempSrc:   PainSc: 0-No pain         Complications: No notable events documented.

## 2023-11-19 NOTE — Progress Notes (Signed)
 Mobility Specialist Progress Note:    11/19/23 1520  Mobility  Activity Ambulated with assistance in hallway  Level of Assistance Contact guard assist, steadying assist  Assistive Device Four wheel walker  Distance Ambulated (ft) 100 ft  Range of Motion/Exercises Active;All extremities  Activity Response Tolerated well  Mobility Referral Yes  Mobility visit 1 Mobility  Mobility Specialist Start Time (ACUTE ONLY) 1450  Mobility Specialist Stop Time (ACUTE ONLY) 1515  Mobility Specialist Time Calculation (min) (ACUTE ONLY) 25 min   Pt received in room with NT, eager for mobility. Required CGA to stand and SBA to ambulate with RW. Tolerated well, asx throughout. Returned to room, left pt in chair. Alarm on, call bell in reach. All needs met.  Lawerance Bach Mobility Specialist Please contact via Special educational needs teacher or  Rehab office at [phone]

## 2023-11-19 NOTE — Plan of Care (Signed)

## 2023-11-19 NOTE — Anesthesia Preprocedure Evaluation (Signed)
 Anesthesia Evaluation  Patient identified by MRN, date of birth, ID band Patient awake    Reviewed: Allergy & Precautions, H&P , NPO status , Patient's Chart, lab work & pertinent test results, reviewed documented beta blocker date and time   Airway Mallampati: II  TM Distance: >3 FB Neck ROM: full    Dental no notable dental hx.    Pulmonary neg pulmonary ROS   Pulmonary exam normal breath sounds clear to auscultation       Cardiovascular Exercise Tolerance: Good hypertension, negative cardio ROS + Valvular Problems/Murmurs  Rhythm:regular Rate:Normal     Neuro/Psych  Neuromuscular disease negative neurological ROS  negative psych ROS   GI/Hepatic negative GI ROS, Neg liver ROS, PUD,GERD  ,,  Endo/Other  negative endocrine ROS    Renal/GU negative Renal ROS  negative genitourinary   Musculoskeletal   Abdominal   Peds  Hematology negative hematology ROS (+)   Anesthesia Other Findings   Reproductive/Obstetrics negative OB ROS                             Anesthesia Physical Anesthesia Plan  ASA: 3 and emergent  Anesthesia Plan: General   Post-op Pain Management:    Induction:   PONV Risk Score and Plan: Propofol infusion  Airway Management Planned:   Additional Equipment:   Intra-op Plan:   Post-operative Plan:   Informed Consent: I have reviewed the patients History and Physical, chart, labs and discussed the procedure including the risks, benefits and alternatives for the proposed anesthesia with the patient or authorized representative who has indicated his/her understanding and acceptance.     Dental Advisory Given  Plan Discussed with: CRNA  Anesthesia Plan Comments:        Anesthesia Quick Evaluation

## 2023-11-20 ENCOUNTER — Telehealth: Payer: Self-pay | Admitting: Gastroenterology

## 2023-11-20 ENCOUNTER — Encounter (HOSPITAL_COMMUNITY): Payer: Self-pay | Admitting: Internal Medicine

## 2023-11-20 DIAGNOSIS — K921 Melena: Secondary | ICD-10-CM | POA: Diagnosis not present

## 2023-11-20 LAB — CBC
HCT: 34.7 % — ABNORMAL LOW (ref 39.0–52.0)
Hemoglobin: 11.1 g/dL — ABNORMAL LOW (ref 13.0–17.0)
MCH: 31.8 pg (ref 26.0–34.0)
MCHC: 32 g/dL (ref 30.0–36.0)
MCV: 99.4 fL (ref 80.0–100.0)
Platelets: 126 10*3/uL — ABNORMAL LOW (ref 150–400)
RBC: 3.49 MIL/uL — ABNORMAL LOW (ref 4.22–5.81)
RDW: 13.7 % (ref 11.5–15.5)
WBC: 4.8 10*3/uL (ref 4.0–10.5)
nRBC: 0 % (ref 0.0–0.2)

## 2023-11-20 LAB — SURGICAL PATHOLOGY

## 2023-11-20 MED ORDER — PANTOPRAZOLE SODIUM 40 MG PO TBEC: 40.0000 mg | DELAYED_RELEASE_TABLET | Freq: Two times a day (BID) | ORAL | 0 refills | Status: DC

## 2023-11-20 NOTE — Discharge Summary (Signed)
 Physician Discharge Summary   Patient: Gregory Pearson MRN: [id number] DOB: February 15, 1928  Admit date:     11/18/2023  Discharge date: 11/20/23  Discharge Physician: Tyrone Nine   PCP: Dettinger, Elige Radon, MD   Recommendations at discharge:  Follow up with PCP in the next 1-2 weeks with recheck of CBC included.  Follow up with GI to schedule repeat EGD in 12 weeks after PPI BID therapy for gastric ulcer surveillance. Pathology from gastric biopsy taken at EGD should also be followed. Continue routine follow up for knee arthritis, must avoid all NSAIDs including relafen.   Discharge Diagnoses: Principal Problem:   Melena  Hospital Course: Gregory Pearson is a 89 y.o. male with a history of NSAID-induced PUD with GI bleeding who presented to the ED on 11/18/2023 with an episode of melena, found to have hgb down from 14.3 to 12.4 with elevated BUN. Vitals were stable and no brisk bleeding was noted. He was admitted, taken for EGD on 3/26 showing an 8mm gastric ulcer (Forrest class 2B), suspected culprit. He was monitored overnight, having remained hemodynamically stable with no further BMs and hgb stable in 11 range.   Assessment and Plan: GI bleeding due to gastric ulcer: Seen on EGD 3/26. Pt may have inadvertently been taking NSAID in the form of relafen  - DC relafen and all NSAIDs - Continue PPI BID x12 weeks - Follow up pathology report from EGD 3/26 - Repeat EGD for surveillance in ~12 weeks.    ABLA:Hgb down from baseline of 14 to 11 on 3/26, stable on 3/27.  - Recheck CBC at follow up   Arthritis:  - Continue tylenol and mobilizing regularly. Pt going dancing this evening.   Depression: Quiescent.  - Continue SSRI  Consultants: GI Procedures performed: EGD as above  Disposition: Home Diet recommendation: Regular DISCHARGE MEDICATION: Allergies as of 11/20/2023   No Known Allergies      Medication List     STOP taking these medications    nabumetone 500 MG tablet Commonly  known as: RELAFEN       TAKE these medications    acetaminophen 500 MG tablet Commonly known as: TYLENOL Take 1 tablet (500 mg total) by mouth every 6 (six) hours as needed. What changed: reasons to take this   famotidine 10 MG tablet Commonly known as: PEPCID Take 10 mg by mouth daily as needed for heartburn or indigestion.   fluticasone 50 MCG/ACT nasal spray Commonly known as: FLONASE Place 1 spray into both nostrils 2 (two) times daily as needed for allergies or rhinitis.   multivitamin tablet Take 1 tablet by mouth daily.   mupirocin ointment 2 % Commonly known as: BACTROBAN Apply 1 Application topically 2 (two) times daily.   pantoprazole 40 MG tablet Commonly known as: Protonix Take 1 tablet (40 mg total) by mouth 2 (two) times daily.   sertraline 50 MG tablet Commonly known as: Zoloft Take 1 tablet (50 mg total) by mouth daily.   traZODone 100 MG tablet Commonly known as: DESYREL Take 1 tablet (100 mg total) by mouth at bedtime as needed. for sleep        Follow-up Information     Dettinger, Elige Radon, MD Follow up.   Specialties: Family Medicine, Cardiology Contact information: [address] [place] [address] [phone]         Corbin Ade, MD. Schedule an appointment as soon as possible for a visit in 1 month(s).   Specialty: Gastroenterology Contact information:  [address] [place] [address] [phone]                Discharge Exam: BP 119/67 (BP Location: Left Arm)   Pulse 81   Temp 98.3 F (36.8 C) (Oral)   Resp 18   Ht 6' (1.829 m)   Wt 93 kg   SpO2 97%   BMI 27.81 kg/m   Gen: Elderly but well-appearing male in no distress Pulm: Clear, nonlabored  CV: RRR with III/VI SEM at the base, compression stockings on.  GI: Soft, NT, ND, +BS Neuro: Alert and oriented. No focal deficits.   EGD Dr. Jena Gauss 11/19/2023: Impression:        - Single 8 mm stellate shaped gastric ulcer -                             Forrest class 2B; likely source of GI bleed.                            Stigmata consistent with low risk of rebleed.                            Gastric biopsy for H. pylori.                           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 Did have H. pylori                            previously and was treated - eradication was proven                            in the distant past.                           Patient prescribed Relafen previously. He tells me                            he takes medication for arthritis, 2 tablets twice                            daily, which will be consistent with Relafen                            dosing. States he has it in his pickup truck in the                            parking lot.   Recommendation: - Advance diet as tolerated. Protonix 40 mg orally                            twice daily for the next 12 weeks. Patient needs  absolutely avoid all NSAIDs. I have discussed my                            findings and recommendations at length with his son                            Fredrico Beedle [phone]). His questions were                            answered. I asked son to retrieve the medication in                            patient's truck so we can identify.                           -Given advanced age, would monitor in the hospital                            another 24 hours and plan to discharge tomorrow                            morning so long as he remains stable. Follow-up on                            pathology. Recommend repeat EGD in 12 weeks to                            assess for healing.                           - Return patient to hospital ward for observation.  Condition at discharge: stable  The results of significant diagnostics from this hospitalization (including imaging, microbiology, ancillary and laboratory) are listed below for  reference.   Imaging Studies: DG CHEST PORT 1 VIEW Result Date: 11/18/2023 CLINICAL DATA:  Preoperative cardiovascular examination. EXAM: PORTABLE CHEST 1 VIEW COMPARISON:  04/26/2022 FINDINGS: Shallow inspiration. Mild linear atelectasis in the lung bases. Heart size and pulmonary vascularity are normal for technique. No airspace disease or consolidation in the lungs. No pleural effusion or pneumothorax. Mediastinal contours appear intact. Calcification of the aorta. IMPRESSION: Shallow inspiration with mild linear atelectasis in the lung bases. Electronically Signed   By: Burman Nieves M.D.   On: 11/18/2023 19:16    Microbiology: Results for orders placed or performed in visit on 11/18/23  Fecal occult blood, imunochemical([facility])     Status: Abnormal   Collection Time: 11/18/23  2:44 PM   Specimen: Stool   ST  Result Value Ref Range Status   Fecal Occult Bld Positive (A) Negative Final    Labs: CBC: Recent Labs  Lab 11/18/23 1259 11/18/23 1448 11/19/23 0406 11/20/23 0407  WBC 7.4 6.3 5.1 4.8  NEUTROABS 5.4 4.7  --   --   HGB 13.3 12.4* 11.4* 11.1*  HCT 39.3 37.9* 34.3* 34.7*  MCV 96 99.0 98.8 99.4  PLT 149* 138* 114* 126*   Basic Metabolic Panel: Recent Labs  Lab 11/18/23 1448 11/19/23 0406  NA 136 140  K 4.3 3.6  CL 101 108  CO2 24 26  GLUCOSE 107* 89  BUN 45* 29*  CREATININE 0.70 0.59*  CALCIUM 8.8* 8.1*   Liver Function Tests: Recent Labs  Lab 11/18/23 1448  AST 22  ALT 20  ALKPHOS 53  BILITOT 0.7  PROT 5.9*  ALBUMIN 3.5   CBG: No results for input(s): "GLUCAP" in the last 168 hours.  Discharge time spent: greater than 30 minutes.  Signed: Tyrone Nine, MD [facility] 11/20/2023

## 2023-11-20 NOTE — Telephone Encounter (Signed)
 Patient has been scheduled

## 2023-11-20 NOTE — Telephone Encounter (Signed)
 Patient needs hospital follow up with Gregory Pearson only in 6 weeks.

## 2023-11-20 NOTE — Progress Notes (Signed)
 Mobility Specialist Progress Note:    11/20/23 1015  Mobility  Activity Ambulated with assistance in hallway  Level of Assistance Standby assist, set-up cues, supervision of patient - no hands on  Assistive Device Four wheel walker  Distance Ambulated (ft) 250 ft  Range of Motion/Exercises Active;All extremities  Activity Response Tolerated well  Mobility Referral Yes  Mobility visit 1 Mobility  Mobility Specialist Start Time (ACUTE ONLY) 0955  Mobility Specialist Stop Time (ACUTE ONLY) 1015  Mobility Specialist Time Calculation (min) (ACUTE ONLY) 20 min   Pt received in chair, agreeable to mobility. Required supervision to stand and ambulate with RW. Tolerated well, asx throughout. Returned to room, left pt in chair. Call bell in lap, all needs met.  Lawerance Bach Mobility Specialist Please contact via Special educational needs teacher or  Rehab office at [phone]

## 2023-11-21 ENCOUNTER — Telehealth: Payer: Self-pay

## 2023-11-21 NOTE — Transitions of Care (Post Inpatient/ED Visit) (Signed)
   11/21/2023  Name: Gregory Pearson MRN: [id number] DOB: 08/25/1928  Today's TOC FU Call Status: Today's TOC FU Call Status:: Successful TOC FU Call Completed TOC FU Call Complete Date: 11/21/23 Patient's Name and Date of Birth confirmed.  Transition Care Management Follow-up Telephone Call Date of Discharge: 11/20/23 Dis[facility] Type of Discharge: Inpatient Admission Primary Inpatient Discharge Diagnosis:: melena How have you been since you were released from the hospital?: Better Any questions or concerns?: No  Items Reviewed: Did you receive and understand the discharge instructions provided?: Yes Medications obtained,verified, and reconciled?: Yes (Medications Reviewed) Any new allergies since your discharge?: No Dietary orders reviewed?: Yes Do you have support at home?: No  Medications Reviewed Today: Medications Reviewed Today     Reviewed by Karena Addison, LPN (Licensed Practical Nurse) on 11/21/23 at [phone]  Med List Status: <None>   Medication Order Taking? Sig Documenting Provider Last Dose Status Informant  acetaminophen (TYLENOL) 500 MG tablet [id number] No Take 1 tablet (500 mg total) by mouth every 6 (six) hours as needed.  Patient taking differently: Take 500 mg by mouth every 6 (six) hours as needed for fever or mild pain (pain score 1-3).   Daryll Drown, NP 11/17/2023 Morning Active Self  famotidine (PEPCID) 10 MG tablet [id number] No Take 10 mg by mouth daily as needed for heartburn or indigestion. [provider] 11/18/2023 Morning Active Self  fluticasone (FLONASE) 50 MCG/ACT nasal spray [id number] No Place 1 spray into both nostrils 2 (two) times daily as needed for allergies or rhinitis. Dettinger, Elige Radon, MD 11/18/2023 Morning Active Self  Multiple Vitamin (MULTIVITAMIN) tablet [id number] No Take 1 tablet by mouth daily. [provider] 11/18/2023 Morning Active Self  mupirocin ointment (BACTROBAN) 2 % [id number] No Apply  1 Application topically 2 (two) times daily. Dettinger, Elige Radon, MD 11/17/2023 Morning Active Self  pantoprazole (PROTONIX) 40 MG tablet [id number]  Take 1 tablet (40 mg total) by mouth 2 (two) times daily. Tyrone Nine, MD  Active   sertraline (ZOLOFT) 50 MG tablet [id number] No Take 1 tablet (50 mg total) by mouth daily. Dettinger, Elige Radon, MD 11/18/2023 Morning Active Self  traZODone (DESYREL) 100 MG tablet [id number] No Take 1 tablet (100 mg total) by mouth at bedtime as needed. for sleep Dettinger, Elige Radon, MD 11/17/2023 Bedtime Active Self            Home Care and Equipment/Supplies: Were Home Health Services Ordered?: NA Any new equipment or medical supplies ordered?: NA  Functional Questionnaire: Do you need assistance with bathing/showering or dressing?: No Do you need assistance with meal preparation?: No Do you need assistance with eating?: No Do you have difficulty maintaining continence: No Do you need assistance with getting out of bed/getting out of a chair/moving?: No Do you have difficulty managing or taking your medications?: No  Follow up appointments reviewed: PCP Follow-up appointment confirmed?: Yes Date of PCP follow-up appointment?: 11/27/23 Follow-up Provider: [facility] Follow-up appointment confirmed?: NA Do you need transportation to your follow-up appointment?: No Do you understand care options if your condition(s) worsen?: Yes-patient verbalized understanding    SIGNATURE Karena Addison, LPN [facility] Nurse Health Advisor Direct Dial [phone]

## 2023-11-22 ENCOUNTER — Encounter: Payer: Self-pay | Admitting: Internal Medicine

## 2023-11-22 NOTE — Anesthesia Postprocedure Evaluation (Signed)
 Anesthesia Post Note  Patient: Gregory Pearson  Procedure(s) Performed: EGD (ESOPHAGOGASTRODUODENOSCOPY)  Patient location during evaluation: Phase II Anesthesia Type: General Level of consciousness: awake Pain management: pain level controlled Vital Signs Assessment: post-procedure vital signs reviewed and stable Respiratory status: spontaneous breathing and respiratory function stable Cardiovascular status: blood pressure returned to baseline and stable Postop Assessment: no headache and no apparent nausea or vomiting Anesthetic complications: no Comments: Late entry   No notable events documented.   Last Vitals:  Vitals:   11/19/23 2014 11/20/23 0338  BP: 126/84 119/67  Pulse: 86 81  Resp: 18 18  Temp: 36.6 C 36.8 C  SpO2: 98% 97%    Last Pain:  Vitals:   11/20/23 1600  TempSrc:   PainSc: 0-No pain                 Windell Norfolk

## 2023-11-27 ENCOUNTER — Inpatient Hospital Stay: Admitting: Nurse Practitioner

## 2023-11-27 ENCOUNTER — Encounter: Payer: Self-pay | Admitting: Family Medicine

## 2023-11-27 ENCOUNTER — Ambulatory Visit: Admitting: Family Medicine

## 2023-11-27 VITALS — BP 135/87 | HR 76 | Temp 98.0°F | Ht 72.0 in | Wt 212.0 lb

## 2023-11-27 DIAGNOSIS — K921 Melena: Secondary | ICD-10-CM

## 2023-11-27 DIAGNOSIS — K250 Acute gastric ulcer with hemorrhage: Secondary | ICD-10-CM | POA: Diagnosis not present

## 2023-11-27 NOTE — Progress Notes (Signed)
 BP 135/87   Pulse 76   Temp 98 F (36.7 C)   Ht 6' (1.829 m)   Wt 212 lb (96.2 kg)   SpO2 98%   BMI 28.75 kg/m    Subjective:   Patient ID: Gregory Pearson, male    DOB: 1928/04/10, 89 y.o.   MRN: [id number]  HPI: Gregory Pearson is a 89 y.o. male presenting on 11/27/2023 for Hospitalization Follow-up (Melena. Feeling better. Denies bleeding, dark stools.)   HPI Hospital follow-up Patient is coming in today for hospital follow-up.  Patient was contacted by transitional care team via phone on 11/21/2023 By Ferdinand Cava, LPN. Patient was admitted on 11/18/2023 and discharged on 11/20/2023.  Patient was in the hospital secondary to melena and GI bleed.  His hemoglobin did dip down to 12.4 from 14.3. He had an EGD on 11/19/2023 that showed an 8 mm gastric ulcer.  His hemoglobin dipped down as low as the 11 range and stayed stable there.  Patient had been taking relaxant which is an NSAID.  They recommended he stop that and continue PPI twice a day for 12 weeks.  They are going to repeat the EGD in 12 weeks per hospital reports.  Son has noticed that his memory is gradually worsening, especially after the hospitalization they noticed that it was a little worse.  He denies having any further blood in his stools  Relevant past medical, surgical, family and social history reviewed and updated as indicated. Interim medical history since our last visit reviewed. Allergies and medications reviewed and updated.  Review of Systems  Constitutional:  Negative for chills and fever.  Eyes:  Negative for visual disturbance.  Respiratory:  Negative for shortness of breath and wheezing.   Cardiovascular:  Negative for chest pain and leg swelling.  Musculoskeletal:  Negative for back pain and gait problem.  Skin:  Negative for rash.  Neurological:  Negative for dizziness, weakness and light-headedness.  All other systems reviewed and are negative.   Per HPI unless specifically indicated above   Allergies  as of 11/27/2023   No Known Allergies      Medication List        Accurate as of November 27, 2023 10:47 AM. If you have any questions, ask your nurse or doctor.          acetaminophen 500 MG tablet Commonly known as: TYLENOL Take 1 tablet (500 mg total) by mouth every 6 (six) hours as needed. What changed: reasons to take this   famotidine 10 MG tablet Commonly known as: PEPCID Take 10 mg by mouth daily as needed for heartburn or indigestion.   fluticasone 50 MCG/ACT nasal spray Commonly known as: FLONASE Place 1 spray into both nostrils 2 (two) times daily as needed for allergies or rhinitis.   multivitamin tablet Take 1 tablet by mouth daily.   mupirocin ointment 2 % Commonly known as: BACTROBAN Apply 1 Application topically 2 (two) times daily.   pantoprazole 40 MG tablet Commonly known as: Protonix Take 1 tablet (40 mg total) by mouth 2 (two) times daily.   sertraline 50 MG tablet Commonly known as: Zoloft Take 1 tablet (50 mg total) by mouth daily.   traZODone 100 MG tablet Commonly known as: DESYREL Take 1 tablet (100 mg total) by mouth at bedtime as needed. for sleep         Objective:   BP 135/87   Pulse 76   Temp 98 F (36.7 C)   Ht 6' (1.829  m)   Wt 212 lb (96.2 kg)   SpO2 98%   BMI 28.75 kg/m   Wt Readings from Last 3 Encounters:  11/27/23 212 lb (96.2 kg)  11/18/23 205 lb 0.4 oz (93 kg)  11/18/23 206 lb 3.2 oz (93.5 kg)    Physical Exam Vitals and nursing note reviewed.  Constitutional:      General: He is not in acute distress.    Appearance: He is well-developed. He is not diaphoretic.  Eyes:     General: No scleral icterus.    Conjunctiva/sclera: Conjunctivae normal.  Neck:     Thyroid: No thyromegaly.  Cardiovascular:     Rate and Rhythm: Normal rate and regular rhythm.     Heart sounds: Normal heart sounds. No murmur heard. Pulmonary:     Effort: Pulmonary effort is normal. No respiratory distress.     Breath sounds:  Normal breath sounds. No wheezing.  Musculoskeletal:        General: No swelling. Normal range of motion.     Cervical back: Neck supple.  Lymphadenopathy:     Cervical: No cervical adenopathy.  Skin:    General: Skin is warm and dry.     Findings: No rash.  Neurological:     Mental Status: He is alert and oriented to person, place, and time.     Coordination: Coordination normal.  Psychiatric:        Behavior: Behavior normal.       Assessment & Plan:   Problem List Items Addressed This Visit       Digestive   Melena   Relevant Orders   CBC with Differential/Platelet   CMP14+EGFR   Other Visit Diagnoses       Acute gastric ulcer with hemorrhage    -  Primary   Relevant Orders   CBC with Differential/Platelet   CMP14+EGFR       Patient denies any further blood in his stool or dark stools.  Will check blood counts today.  He is going to call to get follow-up with GI Follow up plan: Return if symptoms worsen or fail to improve.  Counseling provided for all of the vaccine components Orders Placed This Encounter  Procedures   CBC with Differential/Platelet   CMP14+EGFR    Arville Care, MD [facility] 11/27/2023, 10:47 AM

## 2023-11-28 LAB — CMP14+EGFR
ALT: 21 IU/L (ref 0–44)
AST: 25 IU/L (ref 0–40)
Albumin: 4.1 g/dL (ref 3.6–4.6)
Alkaline Phosphatase: 89 IU/L (ref 44–121)
BUN/Creatinine Ratio: 30 — ABNORMAL HIGH (ref 10–24)
BUN: 18 mg/dL (ref 10–36)
Bilirubin Total: <0.2 mg/dL (ref 0.0–1.2)
CO2: 24 mmol/L (ref 20–29)
Calcium: 8.9 mg/dL (ref 8.6–10.2)
Chloride: 101 mmol/L (ref 96–106)
Creatinine, Ser: 0.61 mg/dL — ABNORMAL LOW (ref 0.76–1.27)
Globulin, Total: 1.8 g/dL (ref 1.5–4.5)
Glucose: 103 mg/dL — ABNORMAL HIGH (ref 70–99)
Potassium: 4.2 mmol/L (ref 3.5–5.2)
Sodium: 140 mmol/L (ref 134–144)
Total Protein: 5.9 g/dL — ABNORMAL LOW (ref 6.0–8.5)
eGFR: 90 mL/min/{1.73_m2} (ref >59)

## 2023-11-28 LAB — CBC WITH DIFFERENTIAL/PLATELET
Basophils Absolute: 0 10*3/uL (ref 0.0–0.2)
Basos: 1 %
EOS (ABSOLUTE): 0.1 10*3/uL (ref 0.0–0.4)
Eos: 2 %
Hematocrit: 35.2 % — ABNORMAL LOW (ref 37.5–51.0)
Hemoglobin: 11.5 g/dL — ABNORMAL LOW (ref 13.0–17.7)
Immature Grans (Abs): 0 10*3/uL (ref 0.0–0.1)
Immature Granulocytes: 0 %
Lymphocytes Absolute: 0.8 10*3/uL (ref 0.7–3.1)
Lymphs: 18 %
MCH: 32.6 pg (ref 26.6–33.0)
MCHC: 32.7 g/dL (ref 31.5–35.7)
MCV: 100 fL — ABNORMAL HIGH (ref 79–97)
Monocytes Absolute: 0.4 10*3/uL (ref 0.1–0.9)
Monocytes: 9 %
Neutrophils Absolute: 3.2 10*3/uL (ref 1.4–7.0)
Neutrophils: 70 %
Platelets: 146 10*3/uL — ABNORMAL LOW (ref 150–450)
RBC: 3.53 x10E6/uL — ABNORMAL LOW (ref 4.14–5.80)
RDW: 12.8 % (ref 11.6–15.4)
WBC: 4.6 10*3/uL (ref 3.4–10.8)

## 2023-12-05 ENCOUNTER — Encounter: Payer: Self-pay | Admitting: Family Medicine

## 2023-12-19 ENCOUNTER — Telehealth: Payer: Self-pay

## 2023-12-19 NOTE — Telephone Encounter (Signed)
 Copied from CRM [id number]. Topic: Appointments - Scheduling Inquiry for Clinic >> Dec 19, 2023  8:59 AM Jyl Or S wrote: Reason for CRM: Patient son called patient has been depressed not wanting to get out of bed Dr Steen Eden is booked out until May patient is asking for a call back for earlier appt Irine Manning [phone]

## 2023-12-19 NOTE — Telephone Encounter (Signed)
 I spoke to pt and scheduled him with Dr Steen Eden 12/24/23 at 1:10.

## 2023-12-24 ENCOUNTER — Ambulatory Visit (INDEPENDENT_AMBULATORY_CARE_PROVIDER_SITE_OTHER): Admitting: Family Medicine

## 2023-12-24 ENCOUNTER — Encounter: Payer: Self-pay | Admitting: Family Medicine

## 2023-12-24 VITALS — BP 119/69 | HR 74 | Ht 72.0 in | Wt 210.0 lb

## 2023-12-24 DIAGNOSIS — F411 Generalized anxiety disorder: Secondary | ICD-10-CM | POA: Insufficient documentation

## 2023-12-24 DIAGNOSIS — F32A Depression, unspecified: Secondary | ICD-10-CM | POA: Insufficient documentation

## 2023-12-24 DIAGNOSIS — F331 Major depressive disorder, recurrent, moderate: Secondary | ICD-10-CM | POA: Diagnosis not present

## 2023-12-24 DIAGNOSIS — R5383 Other fatigue: Secondary | ICD-10-CM

## 2023-12-24 LAB — URINALYSIS, COMPLETE
Bilirubin, UA: NEGATIVE
Glucose, UA: NEGATIVE
Ketones, UA: NEGATIVE
Nitrite, UA: POSITIVE — AB
Protein,UA: NEGATIVE
Specific Gravity, UA: 1.02 (ref 1.005–1.030)
Urobilinogen, Ur: 0.2 mg/dL (ref 0.2–1.0)
pH, UA: 7.5 (ref 5.0–7.5)

## 2023-12-24 LAB — MICROSCOPIC EXAMINATION
Epithelial Cells (non renal): NONE SEEN /HPF (ref 0–10)
RBC, Urine: NONE SEEN /HPF (ref 0–2)
Renal Epithel, UA: NONE SEEN /HPF
Trichomonas, UA: NONE SEEN
Yeast, UA: NONE SEEN

## 2023-12-24 MED ORDER — SERTRALINE HCL 100 MG PO TABS: 100.0000 mg | ORAL_TABLET | Freq: Every day | ORAL | 1 refills | Status: AC

## 2023-12-24 MED ORDER — SERTRALINE HCL 100 MG PO TABS: 100.0000 mg | ORAL_TABLET | Freq: Every day | ORAL | 1 refills | Status: DC

## 2023-12-24 NOTE — Progress Notes (Signed)
 BP 119/69   Pulse 74   Ht 6' (1.829 m)   Wt 210 lb (95.3 kg)   SpO2 97%   BMI 28.48 kg/m    Subjective:   Patient ID: Gregory Pearson, male    DOB: [birth year], 89 y.o.   MRN: [id number]  HPI: Gregory Pearson is a 89 y.o. male presenting on 12/24/2023 for Depression   HPI Anxiety and mood and decreased energy disorder changes Over the past 2 weeks his son is with him today says that he has been having decreased energy motivation not wanting to get up and moving or do things.  He usually goes dancing 3 times a week and gets on the social things and he is just not wanting to do any of that.  He has had some recent GI bleed and ulcer probably took him off of an anti-inflammatory and put him on Tylenol  but he denies having any increased pain.  He denies seeing any blood in the stool or dark stools.  He denies abdominal pain.  He denies any dysuria or urinary frequency or bowel or bladder issues.  He denies any breathing pain.  Son says he is sleeping a little bit more but he is not sleeping through the day.  Relevant past medical, surgical, family and social history reviewed and updated as indicated. Interim medical history since our last visit reviewed. Allergies and medications reviewed and updated.  Review of Systems  Constitutional:  Positive for activity change and fatigue. Negative for chills and fever.  Eyes:  Negative for visual disturbance.  Respiratory:  Negative for shortness of breath and wheezing.   Cardiovascular:  Negative for chest pain and leg swelling.  Gastrointestinal:  Negative for abdominal pain, blood in stool, constipation and diarrhea.  Genitourinary:  Negative for dysuria, frequency and hematuria.  Musculoskeletal:  Negative for arthralgias, back pain and gait problem.  Skin:  Negative for rash.  Neurological:  Positive for weakness. Negative for dizziness and light-headedness.  Psychiatric/Behavioral:  Positive for dysphoric mood. Negative for agitation, self-injury,  sleep disturbance and suicidal ideas. The patient is not nervous/anxious.   All other systems reviewed and are negative.   Per HPI unless specifically indicated above   Allergies as of 12/24/2023   No Known Allergies      Medication List        Accurate as of December 24, 2023  1:30 PM. If you have any questions, ask your nurse or doctor.          acetaminophen  500 MG tablet Commonly known as: TYLENOL  Take 1 tablet (500 mg total) by mouth every 6 (six) hours as needed. What changed: reasons to take this   famotidine 10 MG tablet Commonly known as: PEPCID Take 10 mg by mouth daily as needed for heartburn or indigestion.   fluticasone  50 MCG/ACT nasal spray Commonly known as: FLONASE  Place 1 spray into both nostrils 2 (two) times daily as needed for allergies or rhinitis.   multivitamin tablet Take 1 tablet by mouth daily.   mupirocin  ointment 2 % Commonly known as: BACTROBAN  Apply 1 Application topically 2 (two) times daily.   pantoprazole  40 MG tablet Commonly known as: Protonix  Take 1 tablet (40 mg total) by mouth 2 (two) times daily.   sertraline  100 MG tablet Commonly known as: Zoloft  Take 1 tablet (100 mg total) by mouth daily. What changed:  medication strength how much to take Changed by: Lucio Sabin Antavia Tandy   traZODone  100 MG tablet Commonly known as:  DESYREL  Take 1 tablet (100 mg total) by mouth at bedtime as needed. for sleep         Objective:   BP 119/69   Pulse 74   Ht 6' (1.829 m)   Wt 210 lb (95.3 kg)   SpO2 97%   BMI 28.48 kg/m   Wt Readings from Last 3 Encounters:  12/24/23 210 lb (95.3 kg)  11/27/23 212 lb (96.2 kg)  11/18/23 205 lb 0.4 oz (93 kg)    Physical Exam Vitals and nursing note reviewed.  Constitutional:      General: He is not in acute distress.    Appearance: He is well-developed. He is not diaphoretic.  Eyes:     General: No scleral icterus.    Conjunctiva/sclera: Conjunctivae normal.  Neck:     Thyroid : No  thyromegaly.  Cardiovascular:     Rate and Rhythm: Normal rate and regular rhythm.     Heart sounds: Murmur (holosystolic murmur, 3 out of 6, best heard at left second intercostal space) heard.  Pulmonary:     Effort: Pulmonary effort is normal. No respiratory distress.     Breath sounds: Normal breath sounds. No wheezing.  Abdominal:     General: Abdomen is flat. Bowel sounds are normal. There is no distension.     Palpations: Abdomen is soft.     Tenderness: There is no abdominal tenderness. There is no guarding or rebound.     Hernia: No hernia is present.  Musculoskeletal:        General: Swelling (1+ swelling to bilateral lower extremities) present. Normal range of motion.     Cervical back: Neck supple.  Lymphadenopathy:     Cervical: No cervical adenopathy.  Skin:    General: Skin is warm and dry.     Findings: No rash.  Neurological:     Mental Status: He is alert and oriented to person, place, and time.     Coordination: Coordination normal.  Psychiatric:        Behavior: Behavior normal.       Assessment & Plan:   Problem List Items Addressed This Visit       Other   GAD (generalized anxiety disorder)   Relevant Medications   sertraline  (ZOLOFT ) 100 MG tablet   Other Relevant Orders   CBC with Differential/Platelet   CMP14+EGFR   TSH   Urine Culture   Urinalysis, Complete   Depression - Primary   Relevant Medications   sertraline  (ZOLOFT ) 100 MG tablet   Other Relevant Orders   CBC with Differential/Platelet   CMP14+EGFR   TSH   Urine Culture   Urinalysis, Complete   Other Visit Diagnoses       Other fatigue       Relevant Orders   CBC with Differential/Platelet   CMP14+EGFR   TSH   Urine Culture   Urinalysis, Complete       Will increase Zoloft  just in case it is related to mood or anxiety depression but also want to check his blood counts to make sure he is not losing blood check his kidneys and liver.  Will also check his thyroid  level.   Will also have him leave a urine looking for infection. Follow up plan: Return if symptoms worsen or fail to improve.  Counseling provided for all of the vaccine components Orders Placed This Encounter  Procedures   Urine Culture   CBC with Differential/Platelet   CMP14+EGFR   TSH   Urinalysis, Complete  Jolyne Needs, MD [facility] 12/24/2023, 1:30 PM

## 2023-12-24 NOTE — Addendum Note (Signed)
 Addended by: Jolyne Needs on: 12/24/2023 01:34 PM   Modules accepted: Orders

## 2023-12-25 ENCOUNTER — Encounter: Payer: Self-pay | Admitting: Family Medicine

## 2023-12-25 ENCOUNTER — Other Ambulatory Visit: Payer: Self-pay | Admitting: Family Medicine

## 2023-12-25 ENCOUNTER — Telehealth: Payer: Self-pay | Admitting: Family Medicine

## 2023-12-25 LAB — CBC WITH DIFFERENTIAL/PLATELET
Basophils Absolute: 0 10*3/uL (ref 0.0–0.2)
Basos: 1 %
EOS (ABSOLUTE): 0.1 10*3/uL (ref 0.0–0.4)
Eos: 2 %
Hematocrit: 35.9 % — ABNORMAL LOW (ref 37.5–51.0)
Hemoglobin: 11.3 g/dL — ABNORMAL LOW (ref 13.0–17.7)
Immature Grans (Abs): 0 10*3/uL (ref 0.0–0.1)
Immature Granulocytes: 0 %
Lymphocytes Absolute: 1.1 10*3/uL (ref 0.7–3.1)
Lymphs: 18 %
MCH: 30.2 pg (ref 26.6–33.0)
MCHC: 31.5 g/dL (ref 31.5–35.7)
MCV: 96 fL (ref 79–97)
Monocytes Absolute: 0.5 10*3/uL (ref 0.1–0.9)
Monocytes: 7 %
Neutrophils Absolute: 4.3 10*3/uL (ref 1.4–7.0)
Neutrophils: 72 %
Platelets: 155 10*3/uL (ref 150–450)
RBC: 3.74 x10E6/uL — ABNORMAL LOW (ref 4.14–5.80)
RDW: 12.9 % (ref 11.6–15.4)
WBC: 6.1 10*3/uL (ref 3.4–10.8)

## 2023-12-25 LAB — CMP14+EGFR
ALT: 17 IU/L (ref 0–44)
AST: 21 IU/L (ref 0–40)
Albumin: 4.1 g/dL (ref 3.6–4.6)
Alkaline Phosphatase: 74 IU/L (ref 44–121)
BUN/Creatinine Ratio: 32 — ABNORMAL HIGH (ref 10–24)
BUN: 24 mg/dL (ref 10–36)
Bilirubin Total: 0.2 mg/dL (ref 0.0–1.2)
CO2: 27 mmol/L (ref 20–29)
Calcium: 9.1 mg/dL (ref 8.6–10.2)
Chloride: 102 mmol/L (ref 96–106)
Creatinine, Ser: 0.74 mg/dL — ABNORMAL LOW (ref 0.76–1.27)
Globulin, Total: 1.8 g/dL (ref 1.5–4.5)
Glucose: 102 mg/dL — ABNORMAL HIGH (ref 70–99)
Potassium: 4.5 mmol/L (ref 3.5–5.2)
Sodium: 141 mmol/L (ref 134–144)
Total Protein: 5.9 g/dL — ABNORMAL LOW (ref 6.0–8.5)
eGFR: 84 mL/min/{1.73_m2} (ref >59)

## 2023-12-25 LAB — TSH: TSH: 1.17 u[IU]/mL (ref 0.450–4.500)

## 2023-12-25 MED ORDER — CEPHALEXIN 500 MG PO CAPS: 500.0000 mg | ORAL_CAPSULE | Freq: Four times a day (QID) | ORAL | 0 refills | Status: DC

## 2023-12-25 NOTE — Telephone Encounter (Signed)
 Go ahead and stay on the 50 mg for now, will treat the urinary tract infection and if the symptoms still persist after that then we will increase the medicine to the 100 mg.

## 2023-12-25 NOTE — Telephone Encounter (Signed)
 Patient aware.

## 2023-12-25 NOTE — Telephone Encounter (Unsigned)
 Copied from CRM [id number]. Topic: Clinical - Medication Question >> Dec 25, 2023  9:26 AM Tisa Forester wrote: Reason for CRM: patient want to know if he should start taking the sertraline  (ZOLOFT ) 100 MG tablet since he will be taking a new medication per provider base on his lab results ,  Please contact  patient at [phone]

## 2023-12-28 LAB — URINE CULTURE

## 2023-12-31 NOTE — Progress Notes (Unsigned)
 Referring Provider: Dettinger, Lucio Sabin, MD Primary Care Physician:  Dettinger, Lucio Sabin, MD Primary GI Physician: Dr. Riley Cheadle  Chief Complaint  Patient presents with   Hospitalization Follow-up    Doing better    HPI:   Gregory Pearson is a 89 y.o. male with history of arthritis, HLD, GERD, prior GI bleed in the setting of peptic ulcer disease suspected to be NSAID related, presenting today for hospital follow-up of GI bleed.   Patient was admitted 11/18/23-11/20/23 after presenting to the ER today following a single episode of melena. He was found to have a drop in his Hgb from 14.3, 1 month prior, to 12.4. EGD 3/26 with single 8 mm stellate shaped gastric ulcer likely secondary to Relafen .  Gastric biopsies were negative for H. pylori.  Recommended NSAID avoidance, PPI BID, and repeat EGD in 12 weeks. Hgb was 11.1 on day of discharge.   Most recent labs 12/24/23 with Hgb stable at 11.3   Today:  Doing well aside from fatigue which has been a newer issue.  No abdominal pain, BRBPR, melena, nausea, vomiting, heartburn symptoms, dysphagia.  Bowels are moving normally.  He is taking pantoprazole  40 mg twice daily.  Reports he is not taking NSAIDs including Relafen .  He is only taking Tylenol .  Past Medical History:  Diagnosis Date   Asbestos exposure    Blood transfusion without reported diagnosis    after ruptured appendix 1963   Diverticulosis    Gastric ulcer 06/2019   GERD (gastroesophageal reflux disease)    History of stomach ulcers 1995   H.pylori s/p treatment   History of stomach ulcers    Hyperlipidemia    Muscle strain of right gluteal region 01/24/2014   Prostate cancer (HCC) 2002   prostatectomy   Traumatic hematoma of buttock 01/24/2014    Past Surgical History:  Procedure Laterality Date   APPENDECTOMY  1963   ruptured appendix/gangrene   BIOPSY  07/06/2019   Procedure: BIOPSY;  Surgeon: Suzette Espy, MD;  [facility];  Service: Endoscopy;;    CATARACT EXTRACTION W/PHACO Left 04/04/2023   Procedure: CATARACT EXTRACTION PHACO AND INTRAOCULAR LENS PLACEMENT (IOC);  Surgeon: Ardeth Krabbe, MD;  Location: [facility];  Service: Ophthalmology;  Laterality: Left;  CDE  5.20   CATARACT EXTRACTION W/PHACO Right 04/18/2023   Procedure: CATARACT EXTRACTION PHACO AND INTRAOCULAR LENS PLACEMENT (IOC);  Surgeon: Ardeth Krabbe, MD;  Location: [facility];  Service: Ophthalmology;  Laterality: Right;  CDE 12.07   COLONOSCOPY  2013   Dr. Sandrea Cruel: multiple colon polyps (tubular adenomas), diverticulosis. no further surveillance colonoscopies due to age.   ESOPHAGOGASTRODUODENOSCOPY  1610,9604   Dr. Sandrea Cruel: gastric ulcer (h.pylori + s/p tx), follow up EGD verified ulcer healing   ESOPHAGOGASTRODUODENOSCOPY N/A 09/26/2016   Procedure: ESOPHAGOGASTRODUODENOSCOPY (EGD);  Surgeon: Suzette Espy, MD;  [facility];  Service: Endoscopy;  Laterality: N/A;   ESOPHAGOGASTRODUODENOSCOPY N/A 01/23/2017   Procedure: ESOPHAGOGASTRODUODENOSCOPY (EGD);  Surgeon: Suzette Espy, MD;  [facility];  Service: Endoscopy;  Laterality: N/A;  12:15pm   ESOPHAGOGASTRODUODENOSCOPY N/A 07/06/2019   Procedure: ESOPHAGOGASTRODUODENOSCOPY (EGD);  Surgeon: Suzette Espy, MD; normal esophagus, nonbleeding gastric ulcer with pigmented material, abnormal gastric mucosa of uncertain significance s/p biopsy, normal duodenum.  Pathology with mild reactive gastropathy, mild chronic gastritis, H. pylori negative.   ESOPHAGOGASTRODUODENOSCOPY N/A 10/20/2019   Procedure: ESOPHAGOGASTRODUODENOSCOPY (EGD);  Surgeon: Suzette Espy, MD;  [facility];  Service: Endoscopy;  Laterality: N/A;  9:30am   ESOPHAGOGASTRODUODENOSCOPY N/A 11/19/2023   Surgeon: Suzette Espy, MD; 8 mm satellite shaped gastric ulcer.  Gastric biopsies negative for H. pylori   HEMORRHOID SURGERY  1970   LAPAROSCOPIC CHOLECYSTECTOMY  2003   PROSTATECTOMY  2002    Current Outpatient  Medications  Medication Sig Dispense Refill   acetaminophen  (TYLENOL ) 500 MG tablet Take 1 tablet (500 mg total) by mouth every 6 (six) hours as needed. (Patient taking differently: Take 500 mg by mouth every 6 (six) hours as needed for fever or mild pain (pain score 1-3).) 30 tablet 0   famotidine (PEPCID) 10 MG tablet Take 10 mg by mouth daily as needed for heartburn or indigestion.     fluticasone  (FLONASE ) 50 MCG/ACT nasal spray Place 1 spray into both nostrils 2 (two) times daily as needed for allergies or rhinitis. 16 g 6   Multiple Vitamin (MULTIVITAMIN) tablet Take 1 tablet by mouth daily.     mupirocin  ointment (BACTROBAN ) 2 % Apply 1 Application topically 2 (two) times daily. 22 g 1   pantoprazole  (PROTONIX ) 40 MG tablet Take 1 tablet (40 mg total) by mouth 2 (two) times daily. 168 tablet 0   sertraline  (ZOLOFT ) 100 MG tablet Take 1 tablet (100 mg total) by mouth daily. 90 tablet 1   traZODone  (DESYREL ) 100 MG tablet Take 1 tablet (100 mg total) by mouth at bedtime as needed. for sleep 90 tablet 3   No current facility-administered medications for this visit.    Allergies as of 01/01/2024 - Review Complete 01/01/2024  Allergen Reaction Noted   Nsaids Other (See Comments) 01/01/2024    Family History  Problem Relation Age of Onset   Prostate cancer Brother    Prostate cancer Brother    Suicidality Brother    Prostate cancer Brother    Stomach cancer Neg Hx    Colon cancer Neg Hx     Social History   Socioeconomic History   Marital status: Widowed    Spouse name: Not on file   Number of children: 3   Years of education: Not on file   Highest education level: 12th grade  Occupational History   Occupation: retired Psychiatric nurse power  Tobacco Use   Smoking status: Never   Smokeless tobacco: Never  Vaping Use   Vaping status: Never Used  Substance and Sexual Activity   Alcohol use: Not Currently    Comment: last drank about 15 years ago   Drug use: Never   Sexual activity:  Not on file  Other Topics Concern   Not on file  Social History Narrative   3 children.   Widowed since 01/16/2021. Married x 61 years.    Social Drivers of Corporate investment banker Strain: Low Risk  (12/23/2023)   Overall Financial Resource Strain (CARDIA)    Difficulty of Paying Living Expenses: Not hard at all  Food Insecurity: No Food Insecurity (12/23/2023)   Hunger Vital Sign    Worried About Running Out of Food in the Last Year: Never true    Ran Out of Food in the Last Year: Never true  Transportation Needs: No Transportation Needs (12/23/2023)   PRAPARE - Administrator, Civil Service (Medical): No    Lack of Transportation (Non-Medical): No  Physical Activity: Inactive (12/23/2023)   Exercise Vital Sign    Days of Exercise per Week: 0 days    Minutes of Exercise per Session: 20 min  Stress: No Stress Concern Present (12/23/2023)  [employer] of Occupational Health - Occupational Stress Questionnaire    Feeling of Stress : Not at all  Social Connections: Moderately Integrated (12/23/2023)   Social Connection and Isolation Panel [NHANES]    Frequency of Communication with Friends and Family: More than three times a week    Frequency of Social Gatherings with Friends and Family: Three times a week    Attends Religious Services: More than 4 times per year    Active Member of Clubs or Organizations: Yes    Attends Banker Meetings: More than 4 times per year    Marital Status: Widowed    Review of Systems: Gen: Denies fever, chills, cold or flulike symptoms, presyncope, syncope. CV: Denies chest pain, palpitations. Resp: Denies dyspnea, cough. GI: See HPI Heme: See HPI  Physical Exam: BP 114/70 (BP Location: Left Arm, Patient Position: Sitting, Cuff Size: Normal)   Pulse 74   Temp 98.9 F (37.2 C) (Oral)   Ht 6' (1.829 m)   SpO2 95%   BMI 28.48 kg/m  General:   Alert and oriented. No distress noted. Pleasant and cooperative.  In a  motorized wheelchair. Head:  Normocephalic and atraumatic. Eyes:  Conjuctiva clear without scleral icterus. Heart:  Regular rate and rhythm. Systolic murmur appreciated.  Lungs:  Clear to auscultation bilaterally. No wheezes, rales, or rhonchi. No distress.  Abdomen:  +BS, soft, non-tender and non-distended. No rebound or guarding. No HSM or masses noted. Msk:  Symmetrical without gross deformities. Normal posture. Extremities:  Without edema. Neurologic:  Alert and  oriented x4 Psych:  Normal mood and affect.    Assessment:  89 y.o. male with history of arthritis, HLD, GERD, prior GI bleed in the setting of peptic ulcer disease suspected to be NSAID related, presenting today for hospital follow-up of GI bleed secondary to recurrent PUD.    PUD/anemia: Experiencing an episode of melena the day he presented to the emergency room on 11/18/2023. Hgb was 12.4 on admission, down from 14.3,1 month prior, and declined as low as 11.1 on day of discharge (3/27). EGD 3/26 showed single 8 mm satellite shaped gastric ulcer suspected to be secondary to Relafen .  Gastric biopsies were negative for H. pylori.  He was started on PPI twice daily with recommendations to repeat EGD in 12 weeks.  Clinically, he is doing fairly well. No recurrent overt GI bleeding and is maintaining on PPI twice daily and avoiding all NSAIDs including Relafen .  Most recent labs 12/24/2023 shows hemoglobin stable at 11.3.  Notably, he is reporting new onset fatigue which could be related to his anemia as his prior baseline hemoglobin was in the 14 range.     Plan:  Iron, B12, folate Continue PPI twice daily Continue to avoid all NSAIDs Proceed with upper endoscopy with propofol  by Dr. Riley Cheadle on or after February 11, 2024. The risks, benefits, and alternatives have been discussed with the patient in detail. The patient states understanding and desires to proceed.  ASA 3   Shana Daring, PA-C [facility] 01/01/2024

## 2024-01-01 ENCOUNTER — Ambulatory Visit (INDEPENDENT_AMBULATORY_CARE_PROVIDER_SITE_OTHER): Admitting: Gastroenterology

## 2024-01-01 ENCOUNTER — Encounter: Payer: Self-pay | Admitting: Gastroenterology

## 2024-01-01 ENCOUNTER — Other Ambulatory Visit

## 2024-01-01 VITALS — BP 114/70 | HR 74 | Temp 98.9°F | Ht 72.0 in

## 2024-01-01 DIAGNOSIS — K259 Gastric ulcer, unspecified as acute or chronic, without hemorrhage or perforation: Secondary | ICD-10-CM

## 2024-01-01 DIAGNOSIS — D649 Anemia, unspecified: Secondary | ICD-10-CM | POA: Diagnosis not present

## 2024-01-01 DIAGNOSIS — K279 Peptic ulcer, site unspecified, unspecified as acute or chronic, without hemorrhage or perforation: Secondary | ICD-10-CM

## 2024-01-01 NOTE — Patient Instructions (Signed)
 Please have labs completed at your primary care's  office.  We will get you scheduled for repeat upper endoscopy on or after June 18 for surveillance of peptic ulcer disease.  Continue taking pantoprazole  40 mg twice daily 30 minutes before breakfast and dinner.  Continue to avoid all NSAID products.  I will see you back in the office after your upper endoscopy.  Shana Daring, PA-C [facility]

## 2024-01-02 ENCOUNTER — Encounter: Payer: Self-pay | Admitting: Family Medicine

## 2024-01-02 ENCOUNTER — Other Ambulatory Visit: Payer: Self-pay | Admitting: Gastroenterology

## 2024-01-02 DIAGNOSIS — D509 Iron deficiency anemia, unspecified: Secondary | ICD-10-CM

## 2024-01-02 LAB — B12 AND FOLATE PANEL
Folate: >20 ng/mL (ref >3.0)
Vitamin B-12: 409 pg/mL (ref 232–1245)

## 2024-01-02 LAB — IRON,TIBC AND FERRITIN PANEL
Ferritin: 30 ng/mL (ref 30–400)
Iron Saturation: 5 % — CL (ref 15–55)
Iron: 17 ug/dL — ABNORMAL LOW (ref 38–169)
Total Iron Binding Capacity: 376 ug/dL (ref 250–450)
UIBC: 359 ug/dL — ABNORMAL HIGH (ref 111–343)

## 2024-01-02 MED ORDER — FERROUS SULFATE 325 (65 FE) MG PO TBEC: 325.0000 mg | DELAYED_RELEASE_TABLET | Freq: Two times a day (BID) | ORAL | 3 refills | Status: DC

## 2024-01-06 ENCOUNTER — Ambulatory Visit: Payer: Self-pay

## 2024-01-07 ENCOUNTER — Encounter: Payer: Self-pay | Admitting: *Deleted

## 2024-01-07 NOTE — Telephone Encounter (Signed)
 EGD, ASA 3 after 6/17

## 2024-01-08 ENCOUNTER — Encounter: Payer: Self-pay | Admitting: *Deleted

## 2024-01-21 ENCOUNTER — Telehealth: Payer: Self-pay | Admitting: Family Medicine

## 2024-01-21 DIAGNOSIS — E611 Iron deficiency: Secondary | ICD-10-CM

## 2024-01-21 NOTE — Telephone Encounter (Signed)
 I would say to have him take it for at least a couple months before coming in and rechecking the levels.  He can make an appointment for that.

## 2024-01-21 NOTE — Telephone Encounter (Signed)
 Pt called stating that he was returning a call from nurse Odilia Bennett regarding his labs. I did not see a note from [facility] about this. Pt says he has been taking the iron that Dr Dettinger advised him to take and needs to come back in to have his labs rechecked because he's been on the iron for about 2 wks now. I made him a lab appt for this Friday, 5/30. Can someone add future lab order for this?

## 2024-01-21 NOTE — Telephone Encounter (Signed)
 Dr. Steen Eden,  It looks like the GI provider wanted pt to start iron. When should he come back to reck iron here or should I have him consult with GI?

## 2024-01-22 NOTE — Telephone Encounter (Signed)
 Pt made aware. He will call back to schedule the lab appt. Cancelled lab appt for 5/30. Future iron level ordered.

## 2024-01-23 ENCOUNTER — Other Ambulatory Visit

## 2024-01-26 ENCOUNTER — Other Ambulatory Visit: Payer: Self-pay | Admitting: Gastroenterology

## 2024-01-26 DIAGNOSIS — D509 Iron deficiency anemia, unspecified: Secondary | ICD-10-CM

## 2024-02-20 ENCOUNTER — Encounter (HOSPITAL_COMMUNITY)
Admission: RE | Admit: 2024-02-20 | Discharge: 2024-02-20 | Disposition: A | Discharge status: Home / Self Care | Source: Ambulatory Visit | Attending: Internal Medicine | Admitting: Internal Medicine

## 2024-02-20 ENCOUNTER — Other Ambulatory Visit: Payer: Self-pay

## 2024-02-20 ENCOUNTER — Encounter (HOSPITAL_COMMUNITY): Payer: Self-pay

## 2024-02-20 NOTE — Pre-Procedure Instructions (Signed)
 PAT visit completed via phone with patient and son [name].

## 2024-02-23 ENCOUNTER — Ambulatory Visit (HOSPITAL_COMMUNITY): Admission: RE | Admit: 2024-02-23 | Source: Home / Self Care | Admitting: Internal Medicine

## 2024-02-23 ENCOUNTER — Encounter (INDEPENDENT_AMBULATORY_CARE_PROVIDER_SITE_OTHER): Payer: Self-pay | Admitting: *Deleted

## 2024-02-23 ENCOUNTER — Encounter (HOSPITAL_COMMUNITY): Admission: RE | Payer: Self-pay | Source: Home / Self Care

## 2024-02-23 SURGERY — EGD (ESOPHAGOGASTRODUODENOSCOPY)
Anesthesia: Choice

## 2024-02-29 ENCOUNTER — Encounter: Payer: Self-pay | Admitting: Family Medicine

## 2024-03-05 ENCOUNTER — Other Ambulatory Visit

## 2024-03-05 ENCOUNTER — Other Ambulatory Visit: Payer: Self-pay

## 2024-03-05 ENCOUNTER — Encounter (HOSPITAL_COMMUNITY): Payer: Self-pay

## 2024-03-05 ENCOUNTER — Encounter (HOSPITAL_COMMUNITY)
Admission: RE | Admit: 2024-03-05 | Discharge: 2024-03-05 | Disposition: A | Discharge status: Home / Self Care | Source: Ambulatory Visit | Attending: Internal Medicine | Admitting: Internal Medicine

## 2024-03-06 ENCOUNTER — Ambulatory Visit: Payer: Self-pay | Admitting: Gastroenterology

## 2024-03-06 LAB — IRON,TIBC AND FERRITIN PANEL
Ferritin: 75 ng/mL (ref 30–400)
Iron Saturation: 31 % (ref 15–55)
Iron: 97 ug/dL (ref 38–169)
Total Iron Binding Capacity: 314 ug/dL (ref 250–450)
UIBC: 217 ug/dL (ref 111–343)

## 2024-03-10 ENCOUNTER — Ambulatory Visit (HOSPITAL_COMMUNITY): Admitting: Anesthesiology

## 2024-03-10 ENCOUNTER — Other Ambulatory Visit: Payer: Self-pay

## 2024-03-10 ENCOUNTER — Encounter (HOSPITAL_COMMUNITY)
Admission: RE | Disposition: A | Discharge status: Home / Self Care | Payer: Self-pay | Source: Home / Self Care | Attending: Internal Medicine

## 2024-03-10 ENCOUNTER — Ambulatory Visit (HOSPITAL_COMMUNITY)
Admission: RE | Admit: 2024-03-10 | Discharge: 2024-03-10 | Disposition: A | Discharge status: Home / Self Care | Attending: Internal Medicine | Admitting: Internal Medicine

## 2024-03-10 ENCOUNTER — Encounter (HOSPITAL_COMMUNITY): Payer: Self-pay | Admitting: Internal Medicine

## 2024-03-10 DIAGNOSIS — F419 Anxiety disorder, unspecified: Secondary | ICD-10-CM | POA: Insufficient documentation

## 2024-03-10 DIAGNOSIS — I10 Essential (primary) hypertension: Secondary | ICD-10-CM | POA: Insufficient documentation

## 2024-03-10 DIAGNOSIS — K279 Peptic ulcer, site unspecified, unspecified as acute or chronic, without hemorrhage or perforation: Secondary | ICD-10-CM | POA: Diagnosis not present

## 2024-03-10 DIAGNOSIS — K219 Gastro-esophageal reflux disease without esophagitis: Secondary | ICD-10-CM | POA: Insufficient documentation

## 2024-03-10 DIAGNOSIS — Z8711 Personal history of peptic ulcer disease: Secondary | ICD-10-CM | POA: Insufficient documentation

## 2024-03-10 DIAGNOSIS — Z1211 Encounter for screening for malignant neoplasm of colon: Secondary | ICD-10-CM | POA: Insufficient documentation

## 2024-03-10 DIAGNOSIS — F32A Depression, unspecified: Secondary | ICD-10-CM | POA: Insufficient documentation

## 2024-03-10 DIAGNOSIS — K3189 Other diseases of stomach and duodenum: Secondary | ICD-10-CM

## 2024-03-10 HISTORY — DX: Cardiac murmur, unspecified: R01.1

## 2024-03-10 HISTORY — PX: ESOPHAGOGASTRODUODENOSCOPY: SHX5428

## 2024-03-10 SURGERY — EGD (ESOPHAGOGASTRODUODENOSCOPY)
Anesthesia: General

## 2024-03-10 MED ORDER — STERILE WATER FOR IRRIGATION IR SOLN
Status: DC | PRN
  Administered 2024-03-10: 120 mL

## 2024-03-10 MED ORDER — LIDOCAINE 2% (20 MG/ML) 5 ML SYRINGE
INTRAMUSCULAR | Status: DC | PRN
  Administered 2024-03-10: 100 mg via INTRAVENOUS

## 2024-03-10 MED ORDER — LACTATED RINGERS IV SOLN: INTRAVENOUS | Status: DC

## 2024-03-10 MED ORDER — PROPOFOL 500 MG/50ML IV EMUL
INTRAVENOUS | Status: DC | PRN
  Administered 2024-03-10: 100 ug/kg/min via INTRAVENOUS

## 2024-03-10 MED ORDER — PROPOFOL 10 MG/ML IV BOLUS
INTRAVENOUS | Status: DC | PRN
  Administered 2024-03-10 (×2): 50 mg via INTRAVENOUS

## 2024-03-10 MED ORDER — PHENYLEPHRINE 80 MCG/ML (10ML) SYRINGE FOR IV PUSH (FOR BLOOD PRESSURE SUPPORT)
PREFILLED_SYRINGE | INTRAVENOUS | Status: DC | PRN
  Administered 2024-03-10: 80 ug via INTRAVENOUS

## 2024-03-10 NOTE — H&P (Signed)
 @LOGO @   Primary Care Physician:  Dettinger, Fonda LABOR, MD Primary Gastroenterologist:  Dr. Shaaron  Pre-Procedure History & Physical: HPI:  Gregory Pearson is a 89 y.o. male here for   Surveillance EGD.  History of gastric ulcer Relafen  induced in March biopsies negative for H. pylori malignancy.  He is here for surveillance EGD to assess for healing.  Past Medical History:  Diagnosis Date   Asbestos exposure    Blood transfusion without reported diagnosis    after ruptured appendix 1963   Diverticulosis    Gastric ulcer 06/2019   GERD (gastroesophageal reflux disease)    Heart murmur    History of stomach ulcers 1995   H.pylori s/p treatment   History of stomach ulcers    Hyperlipidemia    Muscle strain of right gluteal region 01/24/2014   Prostate cancer (HCC) 2002   prostatectomy   Traumatic hematoma of buttock 01/24/2014    Past Surgical History:  Procedure Laterality Date   APPENDECTOMY  1963   ruptured appendix/gangrene   BIOPSY  07/06/2019   Procedure: BIOPSY;  Surgeon: Shaaron Lamar HERO, MD;  [facility];  Service: Endoscopy;;   CATARACT EXTRACTION W/PHACO Left 04/04/2023   Procedure: CATARACT EXTRACTION PHACO AND INTRAOCULAR LENS PLACEMENT (IOC);  Surgeon: Juli Blunt, MD;  Location: [facility];  Service: Ophthalmology;  Laterality: Left;  CDE  5.20   CATARACT EXTRACTION W/PHACO Right 04/18/2023   Procedure: CATARACT EXTRACTION PHACO AND INTRAOCULAR LENS PLACEMENT (IOC);  Surgeon: Juli Blunt, MD;  Location: [facility];  Service: Ophthalmology;  Laterality: Right;  CDE 12.07   COLONOSCOPY  2013   Dr. Aneita: multiple colon polyps (tubular adenomas), diverticulosis. no further surveillance colonoscopies due to age.   ESOPHAGOGASTRODUODENOSCOPY  [id number]   Dr. Aneita: gastric ulcer (h.pylori + s/p tx), follow up EGD verified ulcer healing   ESOPHAGOGASTRODUODENOSCOPY N/A 09/26/2016   Procedure: ESOPHAGOGASTRODUODENOSCOPY (EGD);  Surgeon: Lamar HERO Shaaron, MD;   [facility];  Service: Endoscopy;  Laterality: N/A;   ESOPHAGOGASTRODUODENOSCOPY N/A 01/23/2017   Procedure: ESOPHAGOGASTRODUODENOSCOPY (EGD);  Surgeon: Shaaron Lamar HERO, MD;  [facility];  Service: Endoscopy;  Laterality: N/A;  12:15pm   ESOPHAGOGASTRODUODENOSCOPY N/A 07/06/2019   Procedure: ESOPHAGOGASTRODUODENOSCOPY (EGD);  Surgeon: Shaaron Lamar HERO, MD; normal esophagus, nonbleeding gastric ulcer with pigmented material, abnormal gastric mucosa of uncertain significance s/p biopsy, normal duodenum.  Pathology with mild reactive gastropathy, mild chronic gastritis, H. pylori negative.   ESOPHAGOGASTRODUODENOSCOPY N/A 10/20/2019   Procedure: ESOPHAGOGASTRODUODENOSCOPY (EGD);  Surgeon: Shaaron Lamar HERO, MD;  [facility];  Service: Endoscopy;  Laterality: N/A;  9:30am   ESOPHAGOGASTRODUODENOSCOPY N/A 11/19/2023   Surgeon: Shaaron Lamar HERO, MD; 8 mm satellite shaped gastric ulcer.  Gastric biopsies negative for H. pylori   HEMORRHOID SURGERY  1970   LAPAROSCOPIC CHOLECYSTECTOMY  2003   PROSTATECTOMY  2002    Prior to Admission medications   Medication Sig Start Date End Date Taking? Authorizing Provider  acetaminophen  (TYLENOL ) 500 MG tablet Take 1 tablet (500 mg total) by mouth every 6 (six) hours as needed. Patient taking differently: Take 500 mg by mouth every 6 (six) hours as needed for fever or mild pain (pain score 1-3). 11/14/21  Yes [name] HERO, NP  ferrous sulfate  325 (65 FE) MG EC tablet Take 1 tablet (325 mg total) by mouth 2 (two) times daily. 01/02/24 01/01/25 Yes Rudy Josette RAMAN, PA-C  fluticasone  (FLONASE ) 50 MCG/ACT nasal spray Place 1 spray into both nostrils 2 (two)  times daily as needed for allergies or rhinitis. 10/08/23  Yes Dettinger, Fonda LABOR, MD  Multiple Vitamin (MULTIVITAMIN) tablet Take 1 tablet by mouth daily.   Yes [provider]  sertraline  (ZOLOFT ) 100 MG tablet Take 1 tablet (100 mg total) by mouth daily. 12/24/23  Yes  Dettinger, Fonda LABOR, MD  traZODone  (DESYREL ) 100 MG tablet Take 1 tablet (100 mg total) by mouth at bedtime as needed. for sleep 10/08/23  Yes Dettinger, Fonda LABOR, MD  famotidine (PEPCID) 10 MG tablet Take 10 mg by mouth daily as needed for heartburn or indigestion.    [provider]  mupirocin  ointment (BACTROBAN ) 2 % Apply 1 Application topically 2 (two) times daily. 08/08/23   Dettinger, Fonda LABOR, MD  pantoprazole  (PROTONIX ) 40 MG tablet Take 1 tablet (40 mg total) by mouth 2 (two) times daily. 11/20/23 02/12/24  Bryn Bernardino NOVAK, MD    Allergies as of 02/23/2024 - Review Complete 02/20/2024  Allergen Reaction Noted   Nsaids Other (See Comments) 01/01/2024    Family History  Problem Relation Age of Onset   Prostate cancer Brother    Prostate cancer Brother    Suicidality Brother    Prostate cancer Brother    Stomach cancer Neg Hx    Colon cancer Neg Hx     Social History   Socioeconomic History   Marital status: Widowed    Spouse name: Not on file   Number of children: 3   Years of education: Not on file   Highest education level: 12th grade  Occupational History   Occupation: retired Psychiatric nurse power  Tobacco Use   Smoking status: Never   Smokeless tobacco: Never  Vaping Use   Vaping status: Never Used  Substance and Sexual Activity   Alcohol use: Not Currently    Comment: last drank about 15 years ago   Drug use: Never   Sexual activity: Not on file  Other Topics Concern   Not on file  Social History Narrative   3 children.   Widowed since 01/16/2021. Married x 61 years.    Social Drivers of Corporate investment banker Strain: Low Risk  (12/23/2023)   Overall Financial Resource Strain (CARDIA)    Difficulty of Paying Living Expenses: Not hard at all  Food Insecurity: No Food Insecurity (12/23/2023)   Hunger Vital Sign    Worried About Running Out of Food in the Last Year: Never true    Ran Out of Food in the Last Year: Never true  Transportation Needs: No  Transportation Needs (12/23/2023)   PRAPARE - Administrator, Civil Service (Medical): No    Lack of Transportation (Non-Medical): No  Physical Activity: Inactive (12/23/2023)   Exercise Vital Sign    Days of Exercise per Week: 0 days    Minutes of Exercise per Session: 20 min  Stress: No Stress Concern Present (12/23/2023)   [employer] of Occupational Health - Occupational Stress Questionnaire    Feeling of Stress : Not at all  Social Connections: Moderately Integrated (12/23/2023)   Social Connection and Isolation Panel    Frequency of Communication with Friends and Family: More than three times a week    Frequency of Social Gatherings with Friends and Family: Three times a week    Attends Religious Services: More than 4 times per year    Active Member of Clubs or Organizations: Yes    Attends Banker Meetings: More than 4 times per year  Marital Status: Widowed  Intimate Partner Violence: Not At Risk (11/18/2023)   Humiliation, Afraid, Rape, and Kick questionnaire    Fear of Current or Ex-Partner: No    Emotionally Abused: No    Physically Abused: No    Sexually Abused: No    Review of Systems: See HPI, otherwise negative ROS  Physical Exam: BP 117/69   Pulse 77   Temp 97.9 F (36.6 C) (Oral)   Resp 16   SpO2 98%  General:   Alert,  Well-developed, well-nourished, pleasant and cooperative in NAD Neck:  Supple; no masses or thyromegaly. No significant cervical adenopathy. Lungs:  Clear throughout to auscultation.   No wheezes, crackles, or rhonchi. No acute distress. Heart:  Regular rate and rhythm; no murmurs, clicks, rubs,  or gallops. Abdomen: Non-distended, normal bowel sounds.  Soft and nontender without appreciable mass or hepatosplenomegaly.   Impression/Plan:    89 year old gentleman history of NSAID induced gastric ulcer.  He is refrain from using NSAIDs.  H. pylori negative by histology biopsies negative for malignancy.  He is here  for surveillance EGD to verify healing.  The risks, benefits, limitations, alternatives and imponderables have been reviewed with the patient. Potential for esophageal dilation, biopsy, etc. have also been reviewed.  Questions have been answered. All parties agreeable.      Notice: This dictation was prepared with Dragon dictation along with smaller phrase technology. Any transcriptional errors that result from this process are unintentional and may not be corrected upon review.

## 2024-03-10 NOTE — Anesthesia Postprocedure Evaluation (Signed)
 Anesthesia Post Note  Patient: Gregory Pearson  Procedure(s) Performed: EGD (ESOPHAGOGASTRODUODENOSCOPY)  Patient location during evaluation: Phase II Anesthesia Type: General Level of consciousness: awake and alert Pain management: pain level controlled Vital Signs Assessment: post-procedure vital signs reviewed and stable Respiratory status: spontaneous breathing, nonlabored ventilation and respiratory function stable Cardiovascular status: blood pressure returned to baseline and stable Postop Assessment: no apparent nausea or vomiting Anesthetic complications: no   There were no known notable events for this encounter.   Last Vitals:  Vitals:   03/10/24 1247 03/10/24 1415  BP: 117/69 (!) 103/59  Pulse: 77 78  Resp: 16 18  Temp: 36.6 C (!) 36.4 C  SpO2: 98% 98%    Last Pain:  Vitals:   03/10/24 1415  TempSrc: Oral  PainSc: 0-No pain                 Webster Patrone L Draeden Kellman

## 2024-03-10 NOTE — Anesthesia Preprocedure Evaluation (Signed)
 Anesthesia Evaluation  Patient identified by MRN, date of birth, ID band Patient awake    Reviewed: Allergy & Precautions, H&P , NPO status , Patient's Chart, lab work & pertinent test results, reviewed documented beta blocker date and time   Airway Mallampati: II  TM Distance: >3 FB Neck ROM: full    Dental no notable dental hx. (+) Dental Advisory Given, Teeth Intact   Pulmonary neg pulmonary ROS   Pulmonary exam normal breath sounds clear to auscultation       Cardiovascular Exercise Tolerance: Good hypertension, Normal cardiovascular exam+ Valvular Problems/Murmurs  Rhythm:regular Rate:Normal  Systolic murmur.   Neuro/Psych  PSYCHIATRIC DISORDERS Anxiety Depression     Neuromuscular disease    GI/Hepatic Neg liver ROS, PUD,GERD  ,,  Endo/Other  negative endocrine ROS    Renal/GU negative Renal ROS  negative genitourinary   Musculoskeletal  (+) Arthritis , Osteoarthritis,    Abdominal   Peds  Hematology negative hematology ROS (+)   Anesthesia Other Findings Prostate Cancer  Reproductive/Obstetrics negative OB ROS                              Anesthesia Physical Anesthesia Plan  ASA: 2 and emergent  Anesthesia Plan: General   Post-op Pain Management: Minimal or no pain anticipated   Induction: Intravenous  PONV Risk Score and Plan: Propofol  infusion  Airway Management Planned: Natural Airway and Nasal Cannula  Additional Equipment: None  Intra-op Plan:   Post-operative Plan:   Informed Consent: I have reviewed the patients History and Physical, chart, labs and discussed the procedure including the risks, benefits and alternatives for the proposed anesthesia with the patient or authorized representative who has indicated his/her understanding and acceptance.     Dental Advisory Given  Plan Discussed with: CRNA  Anesthesia Plan Comments:         Anesthesia Quick  Evaluation

## 2024-03-10 NOTE — Transfer of Care (Signed)
 Immediate Anesthesia Transfer of Care Note  Patient: Gregory Pearson  Procedure(s) Performed: EGD (ESOPHAGOGASTRODUODENOSCOPY)  Patient Location: Short Stay  Anesthesia Type:MAC  Level of Consciousness: drowsy  Airway & Oxygen Therapy: Patient Spontanous Breathing  Post-op Assessment: Report given to RN and Post -op Vital signs reviewed and stable  Post vital signs: Reviewed and stable  Last Vitals:  Vitals Value Taken Time  BP 103/59 03/10/24 14:12  Temp    Pulse 70 03/10/24 14:12  Resp 15 03/10/24 14:12  SpO2 100% on RA 03/10/24 14:12    Last Pain:  Vitals:   03/10/24 1353  TempSrc:   PainSc: 0-No pain      Patients Stated Pain Goal: 5 (03/10/24 1237)  Complications: No notable events documented.

## 2024-03-10 NOTE — Discharge Instructions (Signed)
 EGD Discharge instructions Please read the instructions outlined below and refer to this sheet in the next few weeks. These discharge instructions provide you with general information on caring for yourself after you leave the hospital. Your doctor may also give you specific instructions. While your treatment has been planned according to the most current medical practices available, unavoidable complications occasionally occur. If you have any problems or questions after discharge, please call your doctor. ACTIVITY You may resume your regular activity but move at a slower pace for the next 24 hours.  Take frequent rest periods for the next 24 hours.  Walking will help expel (get rid of) the air and reduce the bloated feeling in your abdomen.  No driving for 24 hours (because of the anesthesia (medicine) used during the test).  You may shower.  Do not sign any important legal documents or operate any machinery for 24 hours (because of the anesthesia used during the test).  NUTRITION Drink plenty of fluids.  You may resume your normal diet.  Begin with a light meal and progress to your normal diet.  Avoid alcoholic beverages for 24 hours or as instructed by your caregiver.  MEDICATIONS You may resume your normal medications unless your caregiver tells you otherwise.  WHAT YOU CAN EXPECT TODAY You may experience abdominal discomfort such as a feeling of fullness or "gas" pains.  FOLLOW-UP Your doctor will discuss the results of your test with you.  SEEK IMMEDIATE MEDICAL ATTENTION IF ANY OF THE FOLLOWING OCCUR: Excessive nausea (feeling sick to your stomach) and/or vomiting.  Severe abdominal pain and distention (swelling).  Trouble swallowing.  Temperature over 101 F (37.8 C).  Rectal bleeding or vomiting of blood.    Ulcer has healed.  Continue to avoid all NSAIDs   decrease pantoprazole  Protonix  to 40 mg once daily before breakfast.    Office visit with us  in 6 months   at  patient request, I called Tyrone Sharps at [phone] findings and recommendations

## 2024-03-10 NOTE — Op Note (Signed)
 [facility] Patient Name: Gregory Pearson Procedure Date: 03/10/2024 1:40 PM MRN: [id number] Date of Birth: [birth year] Attending MD: Lamar Ozell Hollingshead , MD, [id number] CSN: [id number] Age: 89 Admit Type: Outpatient Procedure:                Upper GI endoscopy Indications:              Surveillance procedure, Peptic ulcer Providers:                Lamar Ozell Hollingshead, MD, Madelin Hunter, RN, Bascom Blush Referring MD:              Medicines:                Propofol  per Anesthesia Complications:            No immediate complications. Estimated Blood Loss:     Estimated blood loss: none. Procedure:                Pre-Anesthesia Assessment:                           - Prior to the procedure, a History and Physical                            was performed, and patient medications and                            allergies were reviewed. The patient's tolerance of                            previous anesthesia was also reviewed. The risks                            and benefits of the procedure and the sedation                            options and risks were discussed with the patient.                            All questions were answered, and informed consent                            was obtained. Prior Anticoagulants: The patient has                            taken no anticoagulant or antiplatelet agents. ASA                            Grade Assessment: III - A patient with severe                            systemic disease. After reviewing the risks and  benefits, the patient was deemed in satisfactory                            condition to undergo the procedure.                           After obtaining informed consent, the endoscope was                            passed under direct vision. Throughout the                            procedure, the patient's blood pressure, pulse, and                            oxygen  saturations were monitored continuously. The                            [id number]-[id number] ([id number]) scope was introduced through the                            mouth, and advanced to the second part of duodenum.                            The upper GI endoscopy was accomplished without                            difficulty. The patient tolerated the procedure                            well. Scope In: 1:58:01 PM Scope Out: 2:05:37 PM Total Procedure Duration: 0 hours 7 minutes 36 seconds  Findings:      The examined esophagus was normal.      Previously noted antral/prepyloric ulcer has completely healed; there is       focal erythema at the location of the previous ulcer. Remainder of       gastric mucosa appeared unremarkable. Pylorus patent and easily       traversed.      The duodenal bulb and second portion of the duodenum were normal. Impression:               - Normal esophagus. Focal erythema at the site of                            previously noted ulcer. Ulcer has healed.                           - Normal duodenal bulb and second portion of the                            duodenum.                           - No specimens collected. Moderate Sedation:      Moderate (conscious) sedation was personally administered by an  anesthesia professional. The following parameters were monitored: oxygen       saturation, heart rate, blood pressure, respiratory rate, EKG, adequacy       of pulmonary ventilation, and response to care. Recommendation:           - Patient has a contact number available for                            emergencies. The signs and symptoms of potential                            delayed complications were discussed with the                            patient. Return to normal activities tomorrow.                            Written discharge instructions were provided to the                            patient.                           - Advance diet as tolerated.  Decrease pantoprazole                             or Protonix  to 40 mg once daily. Avoid NSAIDs                            entirely moving forward. Office visit with us  in 6                            months Procedure Code(s):        --- Professional ---                           [phone], Esophagogastroduodenoscopy, flexible,                            transoral; diagnostic, including collection of                            specimen(s) by brushing or washing, when performed                            (separate procedure) Diagnosis Code(s):        --- Professional ---                           K27.9, Peptic ulcer, site unspecified, unspecified                            as acute or chronic, without hemorrhage or                            perforation CPT copyright 2022 [employer]. All  rights reserved. The codes documented in this report are preliminary and upon coder review may  be revised to meet current compliance requirements. [name], MD Lamar Ozell Hollingshead, MD 03/10/2024 2:17:08 PM This report has been signed electronically. Number of Addenda: 0

## 2024-03-11 ENCOUNTER — Encounter (HOSPITAL_COMMUNITY): Payer: Self-pay | Admitting: Internal Medicine

## 2024-04-07 ENCOUNTER — Ambulatory Visit: Payer: Medicare Other | Admitting: Family Medicine

## 2024-04-12 ENCOUNTER — Other Ambulatory Visit: Payer: Self-pay

## 2024-04-12 ENCOUNTER — Emergency Department (HOSPITAL_COMMUNITY)

## 2024-04-12 ENCOUNTER — Inpatient Hospital Stay (HOSPITAL_COMMUNITY)
Admission: EM | Admit: 2024-04-12 | Discharge: 2024-04-16 | DRG: 291 | Disposition: A | Discharge status: Home / Self Care | Attending: Family Medicine | Admitting: Family Medicine

## 2024-04-12 DIAGNOSIS — F32A Depression, unspecified: Secondary | ICD-10-CM | POA: Diagnosis present

## 2024-04-12 DIAGNOSIS — Z79899 Other long term (current) drug therapy: Secondary | ICD-10-CM | POA: Diagnosis not present

## 2024-04-12 DIAGNOSIS — Z8546 Personal history of malignant neoplasm of prostate: Secondary | ICD-10-CM | POA: Diagnosis not present

## 2024-04-12 DIAGNOSIS — Z7982 Long term (current) use of aspirin: Secondary | ICD-10-CM

## 2024-04-12 DIAGNOSIS — I493 Ventricular premature depolarization: Secondary | ICD-10-CM | POA: Diagnosis present

## 2024-04-12 DIAGNOSIS — R0602 Shortness of breath: Secondary | ICD-10-CM | POA: Diagnosis present

## 2024-04-12 DIAGNOSIS — Z8719 Personal history of other diseases of the digestive system: Secondary | ICD-10-CM

## 2024-04-12 DIAGNOSIS — I509 Heart failure, unspecified: Principal | ICD-10-CM

## 2024-04-12 DIAGNOSIS — Z9079 Acquired absence of other genital organ(s): Secondary | ICD-10-CM | POA: Diagnosis not present

## 2024-04-12 DIAGNOSIS — I5033 Acute on chronic diastolic (congestive) heart failure: Secondary | ICD-10-CM | POA: Diagnosis not present

## 2024-04-12 DIAGNOSIS — I080 Rheumatic disorders of both mitral and aortic valves: Secondary | ICD-10-CM | POA: Diagnosis present

## 2024-04-12 DIAGNOSIS — I472 Ventricular tachycardia, unspecified: Secondary | ICD-10-CM | POA: Diagnosis present

## 2024-04-12 DIAGNOSIS — J9601 Acute respiratory failure with hypoxia: Secondary | ICD-10-CM | POA: Diagnosis present

## 2024-04-12 DIAGNOSIS — Z8711 Personal history of peptic ulcer disease: Secondary | ICD-10-CM

## 2024-04-12 DIAGNOSIS — Z8042 Family history of malignant neoplasm of prostate: Secondary | ICD-10-CM

## 2024-04-12 DIAGNOSIS — E785 Hyperlipidemia, unspecified: Secondary | ICD-10-CM | POA: Diagnosis present

## 2024-04-12 DIAGNOSIS — Z9049 Acquired absence of other specified parts of digestive tract: Secondary | ICD-10-CM | POA: Diagnosis not present

## 2024-04-12 DIAGNOSIS — I350 Nonrheumatic aortic (valve) stenosis: Secondary | ICD-10-CM | POA: Diagnosis not present

## 2024-04-12 DIAGNOSIS — Z77090 Contact with and (suspected) exposure to asbestos: Secondary | ICD-10-CM | POA: Diagnosis present

## 2024-04-12 DIAGNOSIS — Z66 Do not resuscitate: Secondary | ICD-10-CM | POA: Diagnosis present

## 2024-04-12 DIAGNOSIS — K219 Gastro-esophageal reflux disease without esophagitis: Secondary | ICD-10-CM | POA: Diagnosis present

## 2024-04-12 DIAGNOSIS — K5900 Constipation, unspecified: Secondary | ICD-10-CM | POA: Diagnosis not present

## 2024-04-12 DIAGNOSIS — I5031 Acute diastolic (congestive) heart failure: Secondary | ICD-10-CM | POA: Diagnosis not present

## 2024-04-12 DIAGNOSIS — J189 Pneumonia, unspecified organism: Secondary | ICD-10-CM

## 2024-04-12 LAB — COMPREHENSIVE METABOLIC PANEL WITH GFR
ALT: 29 U/L (ref 0–44)
AST: 28 U/L (ref 15–41)
Albumin: 3.4 g/dL — ABNORMAL LOW (ref 3.5–5.0)
Alkaline Phosphatase: 95 U/L (ref 38–126)
Anion gap: 13 (ref 5–15)
BUN: 23 mg/dL (ref 8–23)
CO2: 29 mmol/L (ref 22–32)
Calcium: 8.7 mg/dL — ABNORMAL LOW (ref 8.9–10.3)
Chloride: 94 mmol/L — ABNORMAL LOW (ref 98–111)
Creatinine, Ser: 0.75 mg/dL (ref 0.61–1.24)
GFR, Estimated: >60 mL/min (ref >60)
Glucose, Bld: 101 mg/dL — ABNORMAL HIGH (ref 70–99)
Potassium: 3.5 mmol/L (ref 3.5–5.1)
Sodium: 136 mmol/L (ref 135–145)
Total Bilirubin: 0.8 mg/dL (ref 0.0–1.2)
Total Protein: 6.8 g/dL (ref 6.5–8.1)

## 2024-04-12 LAB — CBC WITH DIFFERENTIAL/PLATELET
Abs Immature Granulocytes: 0.04 K/uL (ref 0.00–0.07)
Basophils Absolute: 0.1 K/uL (ref 0.0–0.1)
Basophils Relative: 0 %
Eosinophils Absolute: 0.2 K/uL (ref 0.0–0.5)
Eosinophils Relative: 2 %
HCT: 44.8 % (ref 39.0–52.0)
Hemoglobin: 14.5 g/dL (ref 13.0–17.0)
Immature Granulocytes: 0 %
Lymphocytes Relative: 8 %
Lymphs Abs: 0.9 K/uL (ref 0.7–4.0)
MCH: 31.2 pg (ref 26.0–34.0)
MCHC: 32.4 g/dL (ref 30.0–36.0)
MCV: 96.3 fL (ref 80.0–100.0)
Monocytes Absolute: 1 K/uL (ref 0.1–1.0)
Monocytes Relative: 9 %
Neutro Abs: 8.9 K/uL — ABNORMAL HIGH (ref 1.7–7.7)
Neutrophils Relative %: 81 %
Platelets: 179 K/uL (ref 150–400)
RBC: 4.65 MIL/uL (ref 4.22–5.81)
RDW: 14.2 % (ref 11.5–15.5)
WBC: 11.1 K/uL — ABNORMAL HIGH (ref 4.0–10.5)
nRBC: 0 % (ref 0.0–0.2)

## 2024-04-12 LAB — TSH: TSH: 0.968 u[IU]/mL (ref 0.350–4.500)

## 2024-04-12 LAB — RESP PANEL BY RT-PCR (RSV, FLU A&B, COVID)  RVPGX2
Influenza A by PCR: NEGATIVE
Influenza B by PCR: NEGATIVE
Resp Syncytial Virus by PCR: NEGATIVE
SARS Coronavirus 2 by RT PCR: NEGATIVE

## 2024-04-12 LAB — MAGNESIUM: Magnesium: 2.3 mg/dL (ref 1.7–2.4)

## 2024-04-12 LAB — MRSA NEXT GEN BY PCR, NASAL: MRSA by PCR Next Gen: NOT DETECTED

## 2024-04-12 LAB — BRAIN NATRIURETIC PEPTIDE: B Natriuretic Peptide: 790 pg/mL — ABNORMAL HIGH (ref 0.0–100.0)

## 2024-04-12 LAB — PROCALCITONIN: Procalcitonin: <0.1 ng/mL

## 2024-04-12 MED ORDER — FUROSEMIDE 10 MG/ML IJ SOLN
40.0000 mg | Freq: Two times a day (BID) | INTRAMUSCULAR | Status: DC
  Administered 2024-04-13 – 2024-04-16 (×7): 40 mg via INTRAVENOUS
  Filled 2024-04-12 (×7): qty 4

## 2024-04-12 MED ORDER — ENOXAPARIN SODIUM 40 MG/0.4ML IJ SOSY
40.0000 mg | PREFILLED_SYRINGE | INTRAMUSCULAR | Status: DC
  Administered 2024-04-12 – 2024-04-15 (×4): 40 mg via SUBCUTANEOUS
  Filled 2024-04-12 (×4): qty 0.4

## 2024-04-12 MED ORDER — ACETAMINOPHEN 325 MG PO TABS
650.0000 mg | ORAL_TABLET | Freq: Four times a day (QID) | ORAL | Status: DC | PRN
Start: 2024-04-12 — End: 2024-04-16
  Administered 2024-04-13 – 2024-04-15 (×6): 650 mg via ORAL
  Filled 2024-04-12 (×6): qty 2

## 2024-04-12 MED ORDER — AZITHROMYCIN 250 MG PO TABS
500.0000 mg | ORAL_TABLET | Freq: Once | ORAL | Status: AC
  Administered 2024-04-12: 500 mg via ORAL
  Filled 2024-04-12: qty 2

## 2024-04-12 MED ORDER — ONDANSETRON HCL 4 MG/2ML IJ SOLN: 4.0000 mg | Freq: Four times a day (QID) | INTRAMUSCULAR | Status: DC | PRN

## 2024-04-12 MED ORDER — SERTRALINE HCL 50 MG PO TABS
50.0000 mg | ORAL_TABLET | Freq: Every day | ORAL | Status: DC
  Administered 2024-04-13 – 2024-04-16 (×4): 50 mg via ORAL
  Filled 2024-04-12 (×4): qty 1

## 2024-04-12 MED ORDER — TRAZODONE HCL 50 MG PO TABS
100.0000 mg | ORAL_TABLET | Freq: Every evening | ORAL | Status: DC | PRN
  Administered 2024-04-12 – 2024-04-14 (×3): 100 mg via ORAL
  Filled 2024-04-12 (×3): qty 2

## 2024-04-12 MED ORDER — SODIUM CHLORIDE 0.9 % IV SOLN
2.0000 g | Freq: Once | INTRAVENOUS | Status: AC
  Administered 2024-04-12: 2 g via INTRAVENOUS
  Filled 2024-04-12: qty 20

## 2024-04-12 MED ORDER — ACETAMINOPHEN 650 MG RE SUPP: 650.0000 mg | Freq: Four times a day (QID) | RECTAL | Status: DC | PRN

## 2024-04-12 MED ORDER — CHLORHEXIDINE GLUCONATE CLOTH 2 % EX PADS
6.0000 | MEDICATED_PAD | Freq: Every day | CUTANEOUS | Status: DC
  Administered 2024-04-13 – 2024-04-16 (×4): 6 via TOPICAL

## 2024-04-12 MED ORDER — FUROSEMIDE 10 MG/ML IJ SOLN
40.0000 mg | INTRAMUSCULAR | Status: AC
  Administered 2024-04-12: 40 mg via INTRAVENOUS
  Filled 2024-04-12: qty 4

## 2024-04-12 MED ORDER — ONDANSETRON HCL 4 MG PO TABS: 4.0000 mg | ORAL_TABLET | Freq: Four times a day (QID) | ORAL | Status: DC | PRN

## 2024-04-12 MED ORDER — POTASSIUM CHLORIDE CRYS ER 20 MEQ PO TBCR
40.0000 meq | EXTENDED_RELEASE_TABLET | Freq: Once | ORAL | Status: AC
  Administered 2024-04-12: 40 meq via ORAL
  Filled 2024-04-12: qty 2

## 2024-04-12 MED ORDER — POLYETHYLENE GLYCOL 3350 17 G PO PACK
17.0000 g | PACK | Freq: Every day | ORAL | Status: DC | PRN
  Administered 2024-04-14: 17 g via ORAL
  Filled 2024-04-12 (×2): qty 1

## 2024-04-12 NOTE — Assessment & Plan Note (Signed)
 O2 sats down to 88% on room air.  Likely due to congestive heart failure.

## 2024-04-12 NOTE — ED Notes (Signed)
 Pt oxygen declined on RA. Nasal cannula applied at 2 lpm to keep above 90%.

## 2024-04-12 NOTE — ED Provider Notes (Signed)
 [place] EMERGENCY DEPARTMENT AT [facility] Provider Note   CSN: [id number] Arrival date & time: 04/12/24  1346     Patient presents with: Shortness of Breath   Gregory Pearson is a 89 y.o. male.   89 year old male presents to the emergency department with shortness of breath.  He reports for the past 2 days has had worsening shortness of breath.  Has PND at baseline.  Also has some lower extremity swelling that he thinks is worse.  Thinks that his abdomen is more swollen he has been gaining weight as well.  Denies any fevers or chills.  Has not had a cough.  Has been rubbing castor a little oil on his skin to treat wrinkles and thinks it may be related.  Does not take a diuretic.  Did have an echo in 2024 which showed that he does have moderate aortic stenosis with mitral regurg.       Prior to Admission medications   Medication Sig Start Date End Date Taking? Authorizing Provider  acetaminophen  (TYLENOL ) 500 MG tablet Take 1 tablet (500 mg total) by mouth every 6 (six) hours as needed. Patient taking differently: Take 1,000 mg by mouth in the morning and at bedtime. 11/14/21  Yes Ijaola, Onyeje M, NP  ascorbic acid (VITAMIN C) 500 MG tablet Take 500 mg by mouth daily.   Yes [provider]  aspirin -sod bicarb-citric acid (ALKA-SELTZER) 325 MG TBEF tablet Take 325 mg by mouth every 12 (twelve) hours as needed (acid reflux, heartburn).   Yes [provider]  fluticasone  (FLONASE ) 50 MCG/ACT nasal spray Place 1 spray into both nostrils 2 (two) times daily as needed for allergies or rhinitis. 10/08/23  Yes Dettinger, Fonda LABOR, MD  magnesium  (MAGTAB) 84 MG ( ) TBCR SR tablet Take 84 mg by mouth daily.   Yes [provider]  Multiple Vitamin (MULTIVITAMIN) tablet Take 1 tablet by mouth daily.   Yes [provider]  Riboflavin (VITAMIN B-2 PO) Take 1 tablet by mouth daily.   Yes [provider]  sertraline  (ZOLOFT ) 100 MG tablet Take  1 tablet (100 mg total) by mouth daily. Patient taking differently: Take 50 mg by mouth daily. 12/24/23  Yes Dettinger, Fonda LABOR, MD  traZODone  (DESYREL ) 100 MG tablet Take 1 tablet (100 mg total) by mouth at bedtime as needed. for sleep Patient taking differently: Take 100 mg by mouth at bedtime as needed for sleep. 10/08/23  Yes Dettinger, Fonda LABOR, MD  vitamin B-12 (CYANOCOBALAMIN) 100 MCG tablet Take 100 mcg by mouth daily.   Yes [provider]    Allergies: Nsaids    Review of Systems  Updated Vital Signs BP 121/68   Pulse 83   Temp 100.3 F (37.9 C) (Oral)   Resp (!) 27   Ht 6' (1.829 m)   Wt 95.3 kg   SpO2 94%   BMI 28.49 kg/m   Physical Exam Vitals and nursing note reviewed.  Constitutional:      General: He is not in acute distress.    Appearance: He is well-developed.  HENT:     Head: Normocephalic and atraumatic.     Right Ear: External ear normal.     Left Ear: External ear normal.     Nose: Nose normal.  Eyes:     Extraocular Movements: Extraocular movements intact.     Conjunctiva/sclera: Conjunctivae normal.     Pupils: Pupils are equal, round, and reactive to light.  Cardiovascular:  Rate and Rhythm: Normal rate. Rhythm irregular.     Heart sounds: Murmur heard.     Comments: Multiple PVCs Pulmonary:     Effort: Pulmonary effort is normal. No respiratory distress.     Breath sounds: Rales (Bibasilar) present.  Abdominal:     General: There is distension.     Palpations: Abdomen is soft. There is no mass.     Tenderness: There is no abdominal tenderness. There is no guarding.  Musculoskeletal:     Cervical back: Normal range of motion and neck supple.     Right lower leg: Edema present.     Left lower leg: Edema present.  Skin:    General: Skin is warm and dry.  Neurological:     Mental Status: He is alert. Mental status is at baseline.  Psychiatric:        Mood and Affect: Mood normal.        Behavior: Behavior normal.     (all  labs ordered are listed, but only abnormal results are displayed) Labs Reviewed  CBC WITH DIFFERENTIAL/PLATELET - Abnormal; Notable for the following components:      Result Value   WBC 11.1 (*)    Neutro Abs 8.9 (*)    All other components within normal limits  COMPREHENSIVE METABOLIC PANEL WITH GFR - Abnormal; Notable for the following components:   Chloride 94 (*)    Glucose, Bld 101 (*)    Calcium  8.7 (*)    Albumin 3.4 (*)    All other components within normal limits  BRAIN NATRIURETIC PEPTIDE - Abnormal; Notable for the following components:   B Natriuretic Peptide 790.0 (*)    All other components within normal limits  RESP PANEL BY RT-PCR (RSV, FLU A&B, COVID)  RVPGX2  RESPIRATORY PANEL BY PCR  CULTURE, BLOOD (ROUTINE X 2)  CULTURE, BLOOD (ROUTINE X 2)  MAGNESIUM   PROCALCITONIN    EKG: EKG Interpretation Date/Time:  Monday April 12 2024 14:18:14 EDT Ventricular Rate:  109 PR Interval:    QRS Duration:  98 QT Interval:  387 QTC Calculation: 393 R Axis:   49  Text Interpretation: sinus rhythm with bigeminy run of Ventricular tachycardia, unsustained  Confirmed by Yolande Charleston [phone]) on 04/12/2024 2:48:51 PM  Radiology: DG Chest 2 View Result Date: 04/12/2024 CLINICAL DATA:  Shortness of breath EXAM: CHEST - 2 VIEW COMPARISON:  November 18, 2023 FINDINGS: Multiple patchy airspace opacities throughout both lungs, new to prior which may represent infection/inflammation versus edema. New small bilateral pleural effusion. Cardiomediastinal silhouette is obscured by patchy pulmonary opacities. Aortic knob is calcified and prominent. No acute osseous abnormality. IMPRESSION: Multifocal new patchy airspace opacities/nodules and small bilateral pleural effusions. Electronically Signed   By: Megan  Zare M.D.   On: 04/12/2024 15:29     Procedures   Medications Ordered in the ED  potassium chloride  SA (KLOR-CON  M) CR tablet 40 mEq (40 mEq Oral Given 04/12/24 1558)   furosemide  (LASIX ) injection 40 mg (40 mg Intravenous Given 04/12/24 1613)  cefTRIAXone  (ROCEPHIN ) 2 g in sodium chloride  0.9 % 100 mL IVPB (2 g Intravenous New Bag/Given 04/12/24 1613)  azithromycin  (ZITHROMAX ) tablet 500 mg (500 mg Oral Given 04/12/24 1616)    Clinical Course as of 04/12/24 1715  Mon Apr 12, 2024  1639 Discussed with Dr Pearlean from hospitalist for admission.  [RP]    Clinical Course User Index [RP] Yolande Charleston BROCKS, MD  Medical Decision Making Amount and/or Complexity of Data Reviewed Labs: ordered. Radiology: ordered.  Risk Prescription drug management. Decision regarding hospitalization.   89 year old male with history of aortic stenosis presents emergency department shortness of breath  Initial Ddx:  CHF, pneumonia, MI, PE, URI  MDM/Course:  Patient presents emergency department shortness of breath of the past few days.  Has not had any infectious symptoms but does have a temperature of 100.3 on arrival.  Does have Rales bibasilarly and appears volume overloaded.  Initially was on room air but then developed a 2 L oxygen requirement.  Chest x-ray shows some patchy infiltrates.  His BNP is also elevated.  Suspect that he heart failure and an atypical pneumonia.  Given antibiotics and procalcitonin will full RVP sent.  Admitted to hospitalist for further management.  Of note, did have frequent PVCs and runs of NSVT.  Was given potassium orally because his K was 3.5.  Magnesium  checked and was normal.  Not having any chest pain that would suggest MI at this point in time.    This patient presents to the ED for concern of complaints listed in HPI, this involves an extensive number of treatment options, and is a complaint that carries with it a high risk of complications and morbidity. Disposition including potential need for admission considered.   Dispo: Admit to Floor  Records reviewed Outpatient Clinic Notes The following  labs were independently interpreted: Chemistry and show no acute abnormality I independently reviewed the following imaging with scope of interpretation limited to determining acute life threatening conditions related to emergency care: Chest x-ray and agree with the radiologist interpretation with the following exceptions: none I personally reviewed and interpreted cardiac monitoring: normal sinus rhythm  and frequent pvcs I personally reviewed and interpreted the pt's EKG: see above for interpretation  I have reviewed the patients home medications and made adjustments as needed Consults: Hospitalist Social Determinants of health:  Geriatric  Portions of this note were generated with Scientist, clinical (histocompatibility and immunogenetics). Dictation errors may occur despite best attempts at proofreading.     Final diagnoses:  Acute on chronic congestive heart failure, unspecified heart failure type (HCC)  Pneumonia due to infectious organism, unspecified laterality, unspecified part of lung    ED Discharge Orders     None          Yolande Lamar BROCKS, MD 04/12/24 1715

## 2024-04-12 NOTE — Assessment & Plan Note (Signed)
 Hemoglobin stable

## 2024-04-12 NOTE — ED Notes (Signed)
 Patient transported to X-ray

## 2024-04-12 NOTE — ED Notes (Signed)
 Pt lying in bed A&Ox4. Pt is currently SOB while on 2L. Lung sounds are clear at this time. Oxygen above 94%.

## 2024-04-12 NOTE — H&P (Addendum)
 History and Physical    Gregory Pearson FMW:[id number] DOB: 09-20-1927 DOA: 04/12/2024  PCP: Dettinger, Fonda LABOR, MD   Patient coming from: Home  I have personally briefly reviewed patient's old medical records in [facility]  Chief Complaint: Difficulty Breathing  HPI: Gregory Pearson is a 89 y.o. male with medical history significant for GI bleed, cardiac murmur. Patient presented to the ED with complaints of difficulty breathing of 2 days duration, and swelling to his bilateral lower extremities, with abdominal bloating also of 2 days duration.  He has lower extremity swelling despite wearing compression stockings, and elevating his legs at night.  Denies orthopnea.  No chest pain.  No history of cardiac disease.  He denies cough.  ED Course: Temperature 100.3.  Heart rate 46-97.  Respiratory rate 17-28.  Blood pressure systolic 103-121. O2 sats 88 percent on room air. Placed on 2L. WBC 11.1.  BNP elevated at 790.  COVID influenza RSV negative. Chest x-ray shows multifocal new patchy airspace opacities, small bilateral pleural effusions. Procalcitonin pending.  Telemetry showing multiple PVCs. IV Lasix  40 mg x 1 given, and empiric ceftriaxone  and azithromycin  given prophylactically in case of concomitant pneumonia.  Review of Systems: As per HPI all other systems reviewed and negative.  Past Medical History:  Diagnosis Date   Asbestos exposure    Blood transfusion without reported diagnosis    after ruptured appendix 1963   Diverticulosis    Gastric ulcer 06/2019   GERD (gastroesophageal reflux disease)    Heart murmur    History of stomach ulcers 1995   H.pylori s/p treatment   History of stomach ulcers    Hyperlipidemia    Muscle strain of right gluteal region 01/24/2014   Prostate cancer (HCC) 2002   prostatectomy   Traumatic hematoma of buttock 01/24/2014    Past Surgical History:  Procedure Laterality Date   APPENDECTOMY  1963   ruptured appendix/gangrene    BIOPSY  07/06/2019   Procedure: BIOPSY;  Surgeon: Shaaron Lamar HERO, MD;  [facility];  Service: Endoscopy;;   CATARACT EXTRACTION W/PHACO Left 04/04/2023   Procedure: CATARACT EXTRACTION PHACO AND INTRAOCULAR LENS PLACEMENT (IOC);  Surgeon: Juli Blunt, MD;  Location: [facility];  Service: Ophthalmology;  Laterality: Left;  CDE  5.20   CATARACT EXTRACTION W/PHACO Right 04/18/2023   Procedure: CATARACT EXTRACTION PHACO AND INTRAOCULAR LENS PLACEMENT (IOC);  Surgeon: Juli Blunt, MD;  Location: [facility];  Service: Ophthalmology;  Laterality: Right;  CDE 12.07   COLONOSCOPY  2013   Dr. Aneita: multiple colon polyps (tubular adenomas), diverticulosis. no further surveillance colonoscopies due to age.   ESOPHAGOGASTRODUODENOSCOPY  8004,8004   Dr. Aneita: gastric ulcer (h.pylori + s/p tx), follow up EGD verified ulcer healing   ESOPHAGOGASTRODUODENOSCOPY N/A 09/26/2016   Procedure: ESOPHAGOGASTRODUODENOSCOPY (EGD);  Surgeon: Lamar HERO Shaaron, MD;  [facility]: [facility];  Service: Endoscopy;  Laterality: N/A;   ESOPHAGOGASTRODUODENOSCOPY N/A 01/23/2017   Procedure: ESOPHAGOGASTRODUODENOSCOPY (EGD);  Surgeon: Shaaron Lamar HERO, MD;  [facility]: [facility];  Service: Endoscopy;  Laterality: N/A;  12:15pm   ESOPHAGOGASTRODUODENOSCOPY N/A 07/06/2019   Procedure: ESOPHAGOGASTRODUODENOSCOPY (EGD);  Surgeon: Shaaron Lamar HERO, MD; normal esophagus, nonbleeding gastric ulcer with pigmented material, abnormal gastric mucosa of uncertain significance s/p biopsy, normal duodenum.  Pathology with mild reactive gastropathy, mild chronic gastritis, H. pylori negative.   ESOPHAGOGASTRODUODENOSCOPY N/A 10/20/2019   Procedure: ESOPHAGOGASTRODUODENOSCOPY (EGD);  Surgeon: Shaaron Lamar HERO, MD;  [facility];  Service: Endoscopy;  Laterality: N/A;  9:30am   ESOPHAGOGASTRODUODENOSCOPY N/A 11/19/2023   Surgeon: Shaaron Lamar HERO, MD; 8 mm satellite shaped gastric ulcer.  Gastric biopsies negative for H.  pylori   ESOPHAGOGASTRODUODENOSCOPY N/A 03/10/2024   Procedure: EGD (ESOPHAGOGASTRODUODENOSCOPY);  Surgeon: Shaaron Lamar HERO, MD;  [facility];  Service: Endoscopy;  Laterality: N/A;  2:00 pm, asa 3   HEMORRHOID SURGERY  1970   LAPAROSCOPIC CHOLECYSTECTOMY  2003   PROSTATECTOMY  2002     reports that he has never smoked. He has never used smokeless tobacco. He reports that he does not currently use alcohol. He reports that he does not use drugs.  Allergies  Allergen Reactions   Nsaids Other (See Comments)    GI bleed, PUD    Family History  Problem Relation Age of Onset   Prostate cancer Brother    Prostate cancer Brother    Suicidality Brother    Prostate cancer Brother    Stomach cancer Neg Hx    Colon cancer Neg Hx    Prior to Admission medications   Medication Sig Start Date End Date Taking? Authorizing Provider  acetaminophen  (TYLENOL ) 500 MG tablet Take 1 tablet (500 mg total) by mouth every 6 (six) hours as needed. Patient taking differently: Take 1,000 mg by mouth in the morning and at bedtime. 11/14/21  Yes Ijaola, Onyeje M, NP  ascorbic acid (VITAMIN C) 500 MG tablet Take 500 mg by mouth daily.   Yes [provider]  aspirin -sod bicarb-citric acid (ALKA-SELTZER) 325 MG TBEF tablet Take 325 mg by mouth every 12 (twelve) hours as needed (acid reflux, heartburn).   Yes [provider]  fluticasone  (FLONASE ) 50 MCG/ACT nasal spray Place 1 spray into both nostrils 2 (two) times daily as needed for allergies or rhinitis. 10/08/23  Yes Dettinger, Fonda LABOR, MD  magnesium  (MAGTAB) 84 MG ( ) TBCR SR tablet Take 84 mg by mouth daily.   Yes [provider]  Multiple Vitamin (MULTIVITAMIN) tablet Take 1 tablet by mouth daily.   Yes [provider]  Riboflavin (VITAMIN B-2 PO) Take 1 tablet by mouth daily.   Yes [provider]  sertraline  (ZOLOFT ) 100 MG tablet Take 1 tablet (100 mg total) by mouth daily. Patient taking  differently: Take 50 mg by mouth daily. 12/24/23  Yes Dettinger, Fonda LABOR, MD  traZODone  (DESYREL ) 100 MG tablet Take 1 tablet (100 mg total) by mouth at bedtime as needed. for sleep Patient taking differently: Take 100 mg by mouth at bedtime as needed for sleep. 10/08/23  Yes Dettinger, Fonda LABOR, MD  vitamin B-12 (CYANOCOBALAMIN) 100 MCG tablet Take 100 mcg by mouth daily.   Yes [provider]    Physical Exam: Vitals:   04/12/24 1436 04/12/24 1445 04/12/24 1600 04/12/24 1615  BP:   110/78 121/68  Pulse: 97 70 (!) 47 (!) 46  Resp: 19 (!) 23 17 (!) 23  Temp:      TempSrc:      SpO2: 95% 95% 95% 95%  Weight:      Height:        Constitutional: NAD, calm, comfortable Vitals:   04/12/24 1436 04/12/24 1445 04/12/24 1600 04/12/24 1615  BP:   110/78 121/68  Pulse: 97 70 (!) 47 (!) 46  Resp: 19 (!) 23 17 (!) 23  Temp:      TempSrc:      SpO2: 95% 95% 95% 95%  Weight:      Height:       Eyes: PERRL,  lids and conjunctivae normal ENMT: Mucous membranes are moist.  Neck: normal, supple, no masses, no thyromegaly Respiratory: clear to auscultation bilaterally, no wheezing, no crackles. Normal respiratory effort. No accessory muscle use.  Cardiovascular: Regular rate and rhythm, no murmurs / rubs / gallops.  At least 2+ pitting bilateral lower extremity edema to at least mid leg, .  Extremities warm. Abdomen: Soft, no tenderness, no masses palpated. No hepatosplenomegaly. Bowel sounds positive.  Musculoskeletal: no clubbing / cyanosis. No joint deformity upper and lower extremities.   Skin: no rashes, lesions, ulcers. No induration Neurologic: No facial asymmetry, moving all extremities spontaneously.  Speech fluent. Psychiatric: Normal judgment and insight. Alert and oriented x 3. Normal mood.   Labs on Admission: I have personally reviewed following labs and imaging studies  CBC: Recent Labs  Lab 04/12/24 1417  WBC 11.1*  NEUTROABS 8.9*  HGB 14.5  HCT 44.8  MCV 96.3   PLT 179   Basic Metabolic Panel: Recent Labs  Lab 04/12/24 1417  NA 136  K 3.5  CL 94*  CO2 29  GLUCOSE 101*  BUN 23  CREATININE 0.75  CALCIUM  8.7*  MG 2.3   GFR: Estimated Creatinine Clearance: 67.6 mL/min (by C-G formula based on SCr of 0.75 mg/dL). Liver Function Tests: Recent Labs  Lab 04/12/24 1417  AST 28  ALT 29  ALKPHOS 95  BILITOT 0.8  PROT 6.8  ALBUMIN 3.4*   Urine analysis:    Component Value Date/Time   COLORURINE YELLOW 04/26/2022 0422   APPEARANCEUR Cloudy (A) 12/24/2023 1351   LABSPEC 1.012 04/26/2022 0422   PHURINE 8.0 04/26/2022 0422   GLUCOSEU Negative 12/24/2023 1351   HGBUR MODERATE (A) 04/26/2022 0422   BILIRUBINUR Negative 12/24/2023 1351   KETONESUR 20 (A) 04/26/2022 0422   PROTEINUR Negative 12/24/2023 1351   PROTEINUR NEGATIVE 04/26/2022 0422   NITRITE Positive (A) 12/24/2023 1351   NITRITE POSITIVE (A) 04/26/2022 0422   LEUKOCYTESUR Trace (A) 12/24/2023 1351   LEUKOCYTESUR LARGE (A) 04/26/2022 0422    Radiological Exams on Admission: DG Chest 2 View Result Date: 04/12/2024 CLINICAL DATA:  Shortness of breath EXAM: CHEST - 2 VIEW COMPARISON:  November 18, 2023 FINDINGS: Multiple patchy airspace opacities throughout both lungs, new to prior which may represent infection/inflammation versus edema. New small bilateral pleural effusion. Cardiomediastinal silhouette is obscured by patchy pulmonary opacities. Aortic knob is calcified and prominent. No acute osseous abnormality. IMPRESSION: Multifocal new patchy airspace opacities/nodules and small bilateral pleural effusions. Electronically Signed   By: Megan  Zare M.D.   On: 04/12/2024 15:29   EKG: Independently reviewed.  Sinus tachycardia rate 109, QTc 393.  PVCs, nonsustained V. tach on EKG.  Assessment/Plan Principal Problem:   Acute hypoxic respiratory failure (HCC) Active Problems:   Acute diastolic CHF (congestive heart failure) (HCC)   History of GI bleed  Assessment and Plan: *  Acute hypoxic respiratory failure (HCC) O2 sats down to 88% on room air.  Likely due to congestive heart failure.  Acute diastolic CHF (congestive heart failure) (HCC) Presenting with 2+ pitting bilateral lower extremity edema, abdominal bloating.  Chest x-ray shows multifocal new patchy airspace opacities, small bilateral pleural effusion.  Weight appears stable per chart.  BNP elevated at 790, no prior to compare.  Last echo 04/2023-EF 60 to 65%, grade 1 DD, moderate aortic stenosis.  Not on diuretics.  EKG showing multiple PVCs, nonsustained VT. - IV Lasix  40 twice daily -Obtain updated echocardiogram -Strict input output, daily weight, daily BMP -Procalcitonin less  than 0.1, WBC 11.1.  Afebrile.  Hold off on further antibiotics at this time -Potassium 3.5, magnesium  2.3, check TSH -May benefit from beta-blockers  History of GI bleed Hemoglobin stable.    DVT prophylaxis: Lovenox  Code Status: DNR-confirmed with patient at bedside. Family Communication: None at bedside Disposition Plan: ~/> 2 days Consults called: None Admission status: Inpt stepdown I certify that at the point of admission it is my clinical judgment that the patient will require inpatient hospital care spanning beyond 2 midnights from the point of admission due to high intensity of service, high risk for further deterioration and high frequency of surveillance required.   Author: Tully FORBES Carwin, MD 04/12/2024 7:44 PM  For on call review [email or web address].

## 2024-04-12 NOTE — Assessment & Plan Note (Deleted)
 Presenting with 2+ pitting bilateral lower extremity edema, abdominal bloating.  Chest x-ray shows multifocal new patchy airspace opacities, small bilateral pleural effusion.  Weight appears stable per chart.  BNP elevated at 790, no prior to compare.  Last echo 04/2023-EF 60 to 65%, grade 1 DD, moderate aortic stenosis.  Not on diuretics. - IV Lasix  40 twice daily -Obtain updated echocardiogram -Strict input output, daily weight, daily BMP -Procalcitonin less than 0.1, WBC 11.1.  Afebrile.  Hold off on further antibiotics at this time

## 2024-04-12 NOTE — Assessment & Plan Note (Addendum)
 Presenting with 2+ pitting bilateral lower extremity edema, abdominal bloating.  Chest x-ray shows multifocal new patchy airspace opacities, small bilateral pleural effusion.  Weight appears stable per chart.  BNP elevated at 790, no prior to compare.  Last echo 04/2023-EF 60 to 65%, grade 1 DD, moderate aortic stenosis.  Not on diuretics.  EKG showing multiple PVCs, nonsustained VT. - IV Lasix  40 twice daily -Obtain updated echocardiogram -Strict input output, daily weight, daily BMP -Procalcitonin less than 0.1, WBC 11.1.  Afebrile.  Hold off on further antibiotics at this time -Potassium 3.5, magnesium  2.3, check TSH -May benefit from beta-blockers

## 2024-04-12 NOTE — ED Triage Notes (Signed)
 Pt arrived via [facility]. Pt called EMS because he's been having shortness of breath over the past two days. Pt states it has gotten worse today. Pt at 93% on RA. Sinus arrhythmia w/ PVC per EMS. Pt states he used caster oil, Baking soda, and ginger oil and putting on skin to treat skin wrinkles. Pt thinks that is what is causing his SOB. Pt at 90% room air upon assessment with clear lung sounds. Pt has multiple PVCs on monitor with irregularities.

## 2024-04-13 ENCOUNTER — Encounter (HOSPITAL_COMMUNITY): Payer: Self-pay | Admitting: Internal Medicine

## 2024-04-13 ENCOUNTER — Other Ambulatory Visit (HOSPITAL_COMMUNITY): Payer: Self-pay | Admitting: *Deleted

## 2024-04-13 ENCOUNTER — Inpatient Hospital Stay (HOSPITAL_COMMUNITY)

## 2024-04-13 DIAGNOSIS — I5033 Acute on chronic diastolic (congestive) heart failure: Secondary | ICD-10-CM

## 2024-04-13 DIAGNOSIS — J9601 Acute respiratory failure with hypoxia: Secondary | ICD-10-CM | POA: Diagnosis not present

## 2024-04-13 LAB — BASIC METABOLIC PANEL WITH GFR
Anion gap: 9 (ref 5–15)
BUN: 22 mg/dL (ref 8–23)
CO2: 29 mmol/L (ref 22–32)
Calcium: 8.1 mg/dL — ABNORMAL LOW (ref 8.9–10.3)
Chloride: 99 mmol/L (ref 98–111)
Creatinine, Ser: 0.69 mg/dL (ref 0.61–1.24)
GFR, Estimated: >60 mL/min (ref >60)
Glucose, Bld: 118 mg/dL — ABNORMAL HIGH (ref 70–99)
Potassium: 3.9 mmol/L (ref 3.5–5.1)
Sodium: 137 mmol/L (ref 135–145)

## 2024-04-13 LAB — ECHOCARDIOGRAM COMPLETE
AR max vel: 0.83 cm2
AV Area VTI: 0.85 cm2
AV Area mean vel: 0.78 cm2
AV Mean grad: 40.7 mmHg
AV Peak grad: 62.2 mmHg
Ao pk vel: 3.94 m/s
Area-P 1/2: 4.8 cm2
Height: 72 in
S' Lateral: 2.9 cm
Weight: 3178.15 [oz_av]

## 2024-04-13 LAB — CBC
HCT: 40.8 % (ref 39.0–52.0)
Hemoglobin: 13.3 g/dL (ref 13.0–17.0)
MCH: 31.5 pg (ref 26.0–34.0)
MCHC: 32.6 g/dL (ref 30.0–36.0)
MCV: 96.7 fL (ref 80.0–100.0)
Platelets: 173 K/uL (ref 150–400)
RBC: 4.22 MIL/uL (ref 4.22–5.81)
RDW: 14.3 % (ref 11.5–15.5)
WBC: 10.3 K/uL (ref 4.0–10.5)
nRBC: 0 % (ref 0.0–0.2)

## 2024-04-13 LAB — RESPIRATORY PANEL BY PCR

## 2024-04-13 MED ORDER — TROLAMINE SALICYLATE 10 % EX CREA: TOPICAL_CREAM | Freq: Two times a day (BID) | CUTANEOUS | Status: DC | PRN

## 2024-04-13 MED ORDER — LIDOCAINE 5 % EX PTCH
1.0000 | MEDICATED_PATCH | Freq: Every day | CUTANEOUS | Status: DC | PRN
  Administered 2024-04-13: 1 via TRANSDERMAL
  Filled 2024-04-13: qty 1

## 2024-04-13 NOTE — Progress Notes (Signed)
 [facility] PROGRESS NOTE  Gregory Pearson (DOB: Sep 22, 1927) FMW:[id number] PCP: Dettinger, Fonda LABOR, MD  Brief Narrative: Gregory Pearson is a 89 y.o. male with medical history significant for GI bleed, cardiac murmur. Patient presented to the ED with complaints of difficulty breathing of 2 days duration, and swelling to his bilateral lower extremities, with abdominal bloating also of 2 days duration.  He has lower extremity swelling despite wearing compression stockings, and elevating his legs at night.  Denies orthopnea.  No chest pain.  No history of cardiac disease.  He denies cough.   ED Course: Temperature 100.3.  Heart rate 46-97.  Respiratory rate 17-28.  Blood pressure systolic 103-121. O2 sats 88 percent on room air. Placed on 2L. WBC 11.1.  BNP elevated at 790.  COVID influenza RSV negative. Chest x-ray shows multifocal new patchy airspace opacities, small bilateral pleural effusions. Procalcitonin undetectable.  Telemetry showing multiple PVCs. IV Lasix  40 mg x 1 given, and empiric ceftriaxone  and azithromycin  given though not continued with reassuring work up.  Subjective: Breathing a bit better, still swollen more than he's used to. Has been making frequent trips for urination. No chest pain.   Objective: BP 121/83   Pulse (!) 57   Temp 99.7 F (37.6 C) (Oral)   Resp 19   Ht 6' (1.829 m)   Wt 90.1 kg   SpO2 95%   BMI 26.94 kg/m   Gen: Elderly male in no distress Pulm: Crackles at bases, nonlabored on 1L O2 CV: RRR, frequent PVCs.  GI: Soft, NT, ND, +BS  Neuro: Alert and oriented. No new focal deficits. Ext: Warm, no deformities. Skin: No rashes, lesions or ulcers on visualized skin   Assessment & Plan: Acute hypoxic respiratory failure (HCC) O2 sats down to 88% on room air.  Likely due to congestive heart failure. - Supplement prn, check ambulatory pulse oximetry.    Acute diastolic CHF (congestive heart failure) (HCC) Presenting with 2+ pitting bilateral  lower extremity edema, abdominal bloating.  Chest x-ray shows multifocal new patchy airspace opacities, small bilateral pleural effusion.  Weight appears stable per chart.  BNP elevated at 790, no prior to compare.  Last echo 04/2023-EF 60 to 65%, grade 1 DD, moderate aortic stenosis.  Not on diuretics.  EKG showing multiple PVCs, nonsustained VT. - Continue lasix  40mg  IV BID - Obtain updated echocardiogram - Strict input output, daily weight, daily BMP - Procalcitonin less than 0.1, WBC 11.1.  Afebrile.  Hold off on further antibiotics at this time   Aortic stenosis: Significant murmur on exam, moderate by previous echo and declined referral to cardiology at that time.  - F/u echo   History of GI bleed Hemoglobin stable.  Bernardino KATHEE Come, MD [facility] [email or web address] 04/13/2024, 2:34 PM

## 2024-04-13 NOTE — Plan of Care (Signed)
   Problem: Clinical Measurements: Goal: Respiratory complications will improve Outcome: Progressing   Problem: Activity: Goal: Risk for activity intolerance will decrease Outcome: Progressing

## 2024-04-13 NOTE — Progress Notes (Signed)
*  PRELIMINARY RESULTS* Echocardiogram 2D Echocardiogram has been performed.  Gregory Pearson 04/13/2024, 2:23 PM

## 2024-04-13 NOTE — Plan of Care (Signed)

## 2024-04-13 NOTE — TOC CM/SW Note (Signed)
 Transition of Care [facility]) - Inpatient Brief Assessment   Patient Details  Name: Gregory Pearson MRN: [id number] Date of Birth: [birth year]  Transition of Care [facility] CM/SW Contact:    Noreen KATHEE Pinal, LCSWA Phone Number: 04/13/2024, 3:57 PM   Clinical Narrative:   Transition of Care Department [facility]) has reviewed patient and no TOC needs have been identified at this time. We will continue to monitor patient advancement through interdisciplinary progression rounds. If new patient transition needs arise, please place a TOC consult.   Transition of Care Asessment: Insurance and Status: Insurance coverage has been reviewed Patient has primary care physician: Yes Home environment has been reviewed: Single Family Home Prior level of function:: Independent Prior/Current Home Services: No current home services Social Drivers of Health Review: SDOH reviewed no interventions necessary Readmission risk has been reviewed: Yes Transition of care needs: no transition of care needs at this time

## 2024-04-14 ENCOUNTER — Ambulatory Visit: Admitting: Family Medicine

## 2024-04-14 DIAGNOSIS — I350 Nonrheumatic aortic (valve) stenosis: Secondary | ICD-10-CM | POA: Diagnosis not present

## 2024-04-14 DIAGNOSIS — I5033 Acute on chronic diastolic (congestive) heart failure: Secondary | ICD-10-CM

## 2024-04-14 DIAGNOSIS — I493 Ventricular premature depolarization: Secondary | ICD-10-CM | POA: Diagnosis not present

## 2024-04-14 DIAGNOSIS — J9601 Acute respiratory failure with hypoxia: Secondary | ICD-10-CM | POA: Diagnosis not present

## 2024-04-14 LAB — BASIC METABOLIC PANEL WITH GFR
Anion gap: 11 (ref 5–15)
BUN: 21 mg/dL (ref 8–23)
CO2: 30 mmol/L (ref 22–32)
Calcium: 8.2 mg/dL — ABNORMAL LOW (ref 8.9–10.3)
Chloride: 96 mmol/L — ABNORMAL LOW (ref 98–111)
Creatinine, Ser: 0.67 mg/dL (ref 0.61–1.24)
GFR, Estimated: >60 mL/min (ref >60)
Glucose, Bld: 120 mg/dL — ABNORMAL HIGH (ref 70–99)
Potassium: 3.7 mmol/L (ref 3.5–5.1)
Sodium: 137 mmol/L (ref 135–145)

## 2024-04-14 MED ORDER — METOPROLOL TARTRATE 25 MG PO TABS
12.5000 mg | ORAL_TABLET | Freq: Two times a day (BID) | ORAL | Status: DC
  Administered 2024-04-14 – 2024-04-16 (×5): 12.5 mg via ORAL
  Filled 2024-04-14 (×5): qty 1

## 2024-04-14 MED ORDER — PANTOPRAZOLE SODIUM 40 MG PO TBEC
40.0000 mg | DELAYED_RELEASE_TABLET | Freq: Every day | ORAL | Status: DC
  Administered 2024-04-14 – 2024-04-16 (×3): 40 mg via ORAL
  Filled 2024-04-14 (×3): qty 1

## 2024-04-14 MED ORDER — ASPIRIN 81 MG PO CHEW
81.0000 mg | CHEWABLE_TABLET | Freq: Every day | ORAL | Status: DC
  Administered 2024-04-14 – 2024-04-16 (×3): 81 mg via ORAL
  Filled 2024-04-14 (×3): qty 1

## 2024-04-14 NOTE — Progress Notes (Signed)
 Patient ambulated around unit today in the AM and this evening with rolling walker. Gait steady. O2 sat on room air while ambulating around 86-89% towards end of ambulation. 2L O2 via [place] placed on patient once back in the room with sats increasing >90%. Dr Bryn made aware.

## 2024-04-14 NOTE — Progress Notes (Signed)
 [facility] PROGRESS NOTE  Gregory Pearson (DOB: June 18, 1928) FMW:[id number] PCP: Dettinger, Fonda LABOR, MD  Brief Narrative: Gregory Pearson is a 89 y.o. male with medical history significant for GI bleed, cardiac murmur. Patient presented to the ED with complaints of difficulty breathing of 2 days duration, and swelling to his bilateral lower extremities, with abdominal bloating also of 2 days duration.  He has lower extremity swelling despite wearing compression stockings, and elevating his legs at night.  Denies orthopnea.  No chest pain.  No history of cardiac disease.  He denies cough.   ED Course: Temperature 100.3.  Heart rate 46-97.  Respiratory rate 17-28.  Blood pressure systolic 103-121. O2 sats 88 percent on room air. Placed on 2L. WBC 11.1.  BNP elevated at 790.  COVID influenza RSV negative. Chest x-ray shows multifocal new patchy airspace opacities, small bilateral pleural effusions. Procalcitonin undetectable.  Telemetry showing multiple PVCs. IV Lasix  40 mg x 1 given, and empiric ceftriaxone  and azithromycin  given though not continued with reassuring work up.  Subjective: Started having significant urine output yesterday and feels his breathing is better. No chest pain. Was actually able to ambulate with assistance around the hall today, but with significant dyspnea which is not his baseline.   Objective: BP 120/67   Pulse 91   Temp 99.2 F (37.3 C) (Oral)   Resp 19   Ht 6' (1.829 m)   Wt 97.9 kg   SpO2 90%   BMI 29.28 kg/m   Gen: Active elderly male in no distress Pulm: No wheezing, crackles have improved, nonlabored  CV: RRR with tight systolic murmur, III/VI most at RUSB.  GI: Soft, NT, ND, +BS  Neuro: Alert and oriented. No new focal deficits. Ext: Warm, no deformities. Skin: No rashes, lesions or ulcers on visualized skin   Assessment & Plan: Acute hypoxic respiratory failure (HCC) O2 sats down to 88% on room air.  Likely due to congestive heart  failure. - Supplement prn, check ambulatory pulse oximetry. Still dyspnea on exertion worse than baseline.    Acute diastolic CHF (congestive heart failure) (HCC) Presenting with 2+ pitting bilateral lower extremity edema, abdominal bloating.  Chest x-ray shows multifocal new patchy airspace opacities, small bilateral pleural effusion.  Weight appears stable per chart.  BNP elevated at 790, no prior to compare.  Last echo 04/2023-EF 60 to 65%, grade 1 DD, moderate aortic stenosis.  Not on diuretics.  EKG showing multiple PVCs, nonsustained VT. - Continue lasix  40mg  IV BID, had good diuretic response over last 24 hours (>3L), though weight not concordant. Exam improving, still hypervolemic.  - Preserved LVEF though AS worse on this echo, will request cardiology consultation. His functional status would suggest that he deserves consideration of TAVR.  - Strict input output, daily weight, daily BMP - Procalcitonin less than 0.1, WBC 11.1.  Afebrile.  Hold off on further antibiotics at this time    History of GI bleed Hemoglobin stable. Starting ASA 81mg  given plan for TAVR evaluation. If hgb stable and no bleeding, would proceed with work up and/or Tx.  - PPI daily  Bernardino KATHEE Come, MD [facility] [email or web address] 04/14/2024, 12:36 PM

## 2024-04-14 NOTE — Care Management Important Message (Signed)
 Important Message  Patient Details  Name: Gregory Pearson MRN: [id number] Date of Birth: 1927/12/21   Important Message Given:  N/A - LOS <3 / Initial given by admissions     [name] 04/14/2024, 11:04 AM

## 2024-04-14 NOTE — Consult Note (Addendum)
 CARDIOLOGY CONSULT NOTE    Patient ID: Gregory Pearson; [id number]; Dec 06, 1927   Admit date: 04/12/2024 Date of Consult: 04/14/2024  Primary Care Provider: Dettinger, Fonda LABOR, MD Primary Cardiologist:  Primary Electrophysiologist:     History of Present Illness:   Gregory Pearson is a 89 year old M known to have history of UGIB secondary to peptic ulcer disease in March 2025, moderate aortic valve stenosis in September 2024, prostate cancer s/p prostatectomy in 2002 presented to the ER with DOE, orthopnea and bilateral leg swelling x 1 month that has worsened in the 3 days prior to presentation.  BNP elevated, 790, CBC and CMP within normal limits, hemoglobin 13.3.  No active bleeding.  Currently on IV Lasix  40 mg twice daily.  He continues to have shortness of breath walking from bed to the chair.  He made 3.7 L urine output in the last 24 hours with net -3.5 L.  But at baseline, he goes out for dancing, reasonably active at his age.  EKG showed NSR, frequent PVCs, couplets, NSVT.  Telemetry reviewed, NSR and frequent PVCs.  Tachycardic, HR 90-100s.  Echocardiogram this admission showed normal LVEF, severe aortic valve stenosis (valve area 0.85 cm, mean PG 40.7 mmHg, V-max 3.94 m/s, DVI 0.23), mild AI, IVC not well-visualized.  Past Medical History:  Diagnosis Date   Asbestos exposure    Blood transfusion without reported diagnosis    after ruptured appendix 1963   Diverticulosis    Gastric ulcer 06/2019   GERD (gastroesophageal reflux disease)    Heart murmur    History of stomach ulcers 1995   H.pylori s/p treatment   History of stomach ulcers    Hyperlipidemia    Muscle strain of right gluteal region 01/24/2014   Prostate cancer (HCC) 2002   prostatectomy   Traumatic hematoma of buttock 01/24/2014    Past Surgical History:  Procedure Laterality Date   APPENDECTOMY  1963   ruptured appendix/gangrene   BIOPSY  07/06/2019   Procedure: BIOPSY;  Surgeon: Shaaron Lamar HERO, MD;   Location: [facility];  Service: Endoscopy;;   CATARACT EXTRACTION W/PHACO Left 04/04/2023   Procedure: CATARACT EXTRACTION PHACO AND INTRAOCULAR LENS PLACEMENT (IOC);  Surgeon: Juli Blunt, MD;  Location: [facility];  Service: Ophthalmology;  Laterality: Left;  CDE  5.20   CATARACT EXTRACTION W/PHACO Right 04/18/2023   Procedure: CATARACT EXTRACTION PHACO AND INTRAOCULAR LENS PLACEMENT (IOC);  Surgeon: Juli Blunt, MD;  Location: [facility];  Service: Ophthalmology;  Laterality: Right;  CDE 12.07   COLONOSCOPY  2013   Dr. Aneita: multiple colon polyps (tubular adenomas), diverticulosis. no further surveillance colonoscopies due to age.   ESOPHAGOGASTRODUODENOSCOPY  8004,8004   Dr. Aneita: gastric ulcer (h.pylori + s/p tx), follow up EGD verified ulcer healing   ESOPHAGOGASTRODUODENOSCOPY N/A 09/26/2016   Procedure: ESOPHAGOGASTRODUODENOSCOPY (EGD);  Surgeon: Lamar HERO Shaaron, MD;  Location: [facility];  Service: Endoscopy;  Laterality: N/A;   ESOPHAGOGASTRODUODENOSCOPY N/A 01/23/2017   Procedure: ESOPHAGOGASTRODUODENOSCOPY (EGD);  Surgeon: Shaaron Lamar HERO, MD;  Location: [facility];  Service: Endoscopy;  Laterality: N/A;  12:15pm   ESOPHAGOGASTRODUODENOSCOPY N/A 07/06/2019   Procedure: ESOPHAGOGASTRODUODENOSCOPY (EGD);  Surgeon: Shaaron Lamar HERO, MD; normal esophagus, nonbleeding gastric ulcer with pigmented material, abnormal gastric mucosa of uncertain significance s/p biopsy, normal duodenum.  Pathology with mild reactive gastropathy, mild chronic gastritis, H. pylori negative.   ESOPHAGOGASTRODUODENOSCOPY N/A 10/20/2019   Procedure: ESOPHAGOGASTRODUODENOSCOPY (EGD);  Surgeon: Shaaron Lamar HERO, MD;  [facility]: [facility];  Service:  Endoscopy;  Laterality: N/A;  9:30am   ESOPHAGOGASTRODUODENOSCOPY N/A 11/19/2023   Surgeon: Shaaron Lamar HERO, MD; 8 mm satellite shaped gastric ulcer.  Gastric biopsies negative for H. pylori   ESOPHAGOGASTRODUODENOSCOPY N/A 03/10/2024   Procedure: EGD  (ESOPHAGOGASTRODUODENOSCOPY);  Surgeon: Shaaron Lamar HERO, MD;  Location: [facility];  Service: Endoscopy;  Laterality: N/A;  2:00 pm, asa 3   HEMORRHOID SURGERY  1970   LAPAROSCOPIC CHOLECYSTECTOMY  2003   PROSTATECTOMY  2002       Inpatient Medications: Scheduled Meds:  Chlorhexidine  Gluconate Cloth  6 each Topical Q0600   enoxaparin  (LOVENOX ) injection  40 mg Subcutaneous Q24H   furosemide   40 mg Intravenous Q12H   metoprolol  tartrate  12.5 mg Oral BID   sertraline   50 mg Oral Daily   Continuous Infusions:  PRN Meds: acetaminophen  **OR** acetaminophen , lidocaine , ondansetron  **OR** ondansetron  (ZOFRAN ) IV, polyethylene glycol, traZODone , trolamine salicylate  Allergies:    Allergies  Allergen Reactions   Nsaids Other (See Comments)    GI bleed, PUD    Social History:   Social History   Socioeconomic History   Marital status: Widowed    Spouse name: Not on file   Number of children: 3   Years of education: Not on file   Highest education level: 12th grade  Occupational History   Occupation: retired Psychiatric nurse power  Tobacco Use   Smoking status: Never   Smokeless tobacco: Never  Vaping Use   Vaping status: Never Used  Substance and Sexual Activity   Alcohol use: Not Currently    Comment: last drank about 15 years ago   Drug use: Never   Sexual activity: Not on file  Other Topics Concern   Not on file  Social History Narrative   3 children.   Widowed since 01/16/2021. Married x 61 years.    Social Drivers of Corporate investment banker Strain: Low Risk  (12/23/2023)   Overall Financial Resource Strain (CARDIA)    Difficulty of Paying Living Expenses: Not hard at all  Food Insecurity: No Food Insecurity (04/12/2024)   Hunger Vital Sign    Worried About Running Out of Food in the Last Year: Never true    Ran Out of Food in the Last Year: Never true  Transportation Needs: No Transportation Needs (04/12/2024)   PRAPARE - Administrator, Civil Service  (Medical): No    Lack of Transportation (Non-Medical): No  Physical Activity: Inactive (12/23/2023)   Exercise Vital Sign    Days of Exercise per Week: 0 days    Minutes of Exercise per Session: 20 min  Stress: No Stress Concern Present (12/23/2023)   [employer] of Occupational Health - Occupational Stress Questionnaire    Feeling of Stress : Not at all  Social Connections: Moderately Integrated (04/12/2024)   Social Connection and Isolation Panel    Frequency of Communication with Friends and Family: More than three times a week    Frequency of Social Gatherings with Friends and Family: More than three times a week    Attends Religious Services: More than 4 times per year    Active Member of [employer] or Organizations: Yes    Attends Banker Meetings: More than 4 times per year    Marital Status: Widowed  Intimate Partner Violence: Not At Risk (04/12/2024)   Humiliation, Afraid, Rape, and Kick questionnaire    Fear of Current or Ex-Partner: No    Emotionally Abused: No    Physically Abused:  No    Sexually Abused: No    Family History:    Family History  Problem Relation Age of Onset   Prostate cancer Brother    Prostate cancer Brother    Suicidality Brother    Prostate cancer Brother    Stomach cancer Neg Hx    Colon cancer Neg Hx      ROS:  Please see the history of present illness.  ROS  All other ROS reviewed and negative.     Physical Exam/Data:   Vitals:   04/14/24 1000 04/14/24 1006 04/14/24 1100 04/14/24 1131  BP: 105/64  (!) 127/48 120/67  Pulse: (!) 54  (!) 128 91  Resp: 12  (!) 22   Temp:      TempSrc:      SpO2:  93% (!) 89%   Weight:      Height:        Intake/Output Summary (Last 24 hours) at 04/14/2024 1132 Last data filed at 04/14/2024 0815 Gross per 24 hour  Intake 240 ml  Output 2900 ml  Net -2660 ml   Filed Weights   04/13/24 0648 04/14/24 0435 04/14/24 0956  Weight: 90.1 kg 98.7 kg 97.9 kg   Body mass index is 29.28  kg/m.  General:  Well nourished, well developed, in no acute distress HEENT: normal Lymph: no adenopathy Neck: no JVD Endocrine:  No thryomegaly Vascular: No carotid bruits; FA pulses 2+ bilaterally without bruits  Cardiac:  normal S1, S2; RRR; ESM with absent S2 Lungs:  clear to auscultation bilaterally, no wheezing, rhonchi or rales  Abd: soft, nontender, no hepatomegaly  Ext: no edema Musculoskeletal:  No deformities, BUE and BLE strength normal and equal Skin: warm and dry  Neuro:  CNs 2-12 intact, no focal abnormalities noted Psych:  Normal affect   Laboratory Data:  Chemistry Recent Labs  Lab 04/12/24 1417 04/13/24 0527 04/14/24 0431  NA 136 137 137  K 3.5 3.9 3.7  CL 94* 99 96*  CO2 29 29 30   GLUCOSE 101* 118* 120*  BUN 23 22 21   CREATININE 0.75 0.69 0.67  CALCIUM  8.7* 8.1* 8.2*  GFRNONAA >60 >60 >60  ANIONGAP 13 9 11     Recent Labs  Lab 04/12/24 1417  PROT 6.8  ALBUMIN 3.4*  AST 28  ALT 29  ALKPHOS 95  BILITOT 0.8   Hematology Recent Labs  Lab 04/12/24 1417 04/13/24 0527  WBC 11.1* 10.3  RBC 4.65 4.22  HGB 14.5 13.3  HCT 44.8 40.8  MCV 96.3 96.7  MCH 31.2 31.5  MCHC 32.4 32.6  RDW 14.2 14.3  PLT 179 173   Cardiac EnzymesNo results for input(s): TROPONINI in the last 168 hours. No results for input(s): TROPIPOC in the last 168 hours.  BNP Recent Labs  Lab 04/12/24 1417  BNP 790.0*    DDimer No results for input(s): DDIMER in the last 168 hours.  Radiology/Studies:    Assessment and Plan:   Acute on chronic diastolic heart failure exacerbation Severe aortic valve stenosis - Presented with orthopnea, DOE, bilateral leg swelling x 1 month that has worsened in the last 3 days prior to presentation. BNP 790.  Chest x-ray showed pulmonary vascular congestion.  Currently on IV Lasix  40 mg twice daily, 3.7 L urine output in the last 24 hours with net -3.5 L.  He continues to have shortness of breath walking from bed to the chair.  Will  continue IV Lasix  40 mg twice daily. - Echocardiogram this admission  showed severe aortic valve stenosis (moderate AS in September 2024).valve area 0.85 cm, mean PG 40.7 mmHg, V-max 3.94 m/s, DVI 0.23 now.  Physical exam showed findings consistent with severe AS as well. - He is reasonably active at his age, goes out for dancing etc. and not sedentary. - Ideally, he will benefit from TAVR due to decent functional status, and hence LHC/RHC, CT cardiac TAVR protocol. - However per chart review, he had history of UGIB from gastric ulcer disease secondary to NSAID use, underwent EGD in March and July 2025, GI recommended to stay away from NSAIDs indefinitely.  Currently not on any aspirin . - Will challenge him with p.o. aspirin  81 mg once daily starting today.  He was supposed to be on pantoprazole  40 mg once daily per GI recommendations, not on it at home.  Will start p.o. pantoprazole  40 mg once daily.  If he does not bleed or/and keeps his hemoglobin stable for the next 1 month, he likely will be a candidate for TAVR.  If not, palliative care will need to be involved.  Will follow-up on this in the outpatient setting.  CBC in 1 month, general cardiology follow-up in 1 month and structural heart clinic in 6 weeks.  Patient and family agreeable to this plan. - Patient is currently DNR however he did agree to reverse his CODE STATUS to full code for the invasive cardiac procedures.  Frequent PVCs/couplets/NSVT - Likely secondary to severe AS, acute on chronic diastolic heart failure - Start metoprolol  to tartrate 12.5 mg twice daily. - Echocardiogram showed normal LVEF.  I had an extensive discussion with the family at the bedside.  Family included daughter and son-in-law.  Collateral history is obtained from the daughter, son-in-law.  History provided by the patient as well.  Complex decision making.  60 minutes spent with the patient/family.  20 minutes spent in documentation.  CRITICAL CARE Performed  by: Diannah SQUIBB Merik Mignano   Total critical care time: 80 minutes  Critical care time was exclusive of separately billable procedures and treating other patients.  Critical care was necessary to treat or prevent imminent or life-threatening deterioration.  Critical care was time spent personally by me on the following activities: development of treatment plan with patient and/or surrogate as well as nursing, discussions with consultants, evaluation of patient's response to treatment, examination of patient, obtaining history from patient or surrogate, ordering and performing treatments and interventions, ordering and review of laboratory studies, ordering and review of radiographic studies, pulse oximetry and re-evaluation of patient's condition.    For questions or updates, please contact [facility] Please consult [email or web address] for contact info under Cardiology/STEMI.   Signed, Tryniti Laatsch Priya Nillie Bartolotta, MD 04/14/2024 11:32 AM

## 2024-04-14 NOTE — Plan of Care (Signed)

## 2024-04-15 ENCOUNTER — Telehealth: Payer: Self-pay

## 2024-04-15 ENCOUNTER — Telehealth: Payer: Self-pay | Admitting: *Deleted

## 2024-04-15 ENCOUNTER — Encounter (HOSPITAL_COMMUNITY): Payer: Self-pay | Admitting: Internal Medicine

## 2024-04-15 DIAGNOSIS — I5033 Acute on chronic diastolic (congestive) heart failure: Secondary | ICD-10-CM | POA: Diagnosis not present

## 2024-04-15 DIAGNOSIS — I493 Ventricular premature depolarization: Secondary | ICD-10-CM | POA: Diagnosis not present

## 2024-04-15 DIAGNOSIS — I350 Nonrheumatic aortic (valve) stenosis: Secondary | ICD-10-CM | POA: Diagnosis not present

## 2024-04-15 DIAGNOSIS — J9601 Acute respiratory failure with hypoxia: Secondary | ICD-10-CM | POA: Diagnosis not present

## 2024-04-15 LAB — BASIC METABOLIC PANEL WITH GFR
Anion gap: 10 (ref 5–15)
BUN: 23 mg/dL (ref 8–23)
CO2: 30 mmol/L (ref 22–32)
Calcium: 8.1 mg/dL — ABNORMAL LOW (ref 8.9–10.3)
Chloride: 94 mmol/L — ABNORMAL LOW (ref 98–111)
Creatinine, Ser: 0.66 mg/dL (ref 0.61–1.24)
GFR, Estimated: >60 mL/min (ref >60)
Glucose, Bld: 118 mg/dL — ABNORMAL HIGH (ref 70–99)
Potassium: 3.9 mmol/L (ref 3.5–5.1)
Sodium: 134 mmol/L — ABNORMAL LOW (ref 135–145)

## 2024-04-15 MED ORDER — MAGNESIUM HYDROXIDE 400 MG/5ML PO SUSP
30.0000 mL | Freq: Every day | ORAL | Status: DC | PRN
  Administered 2024-04-15: 30 mL via ORAL
  Filled 2024-04-15: qty 30

## 2024-04-15 MED ORDER — POLYETHYLENE GLYCOL 3350 17 G PO PACK
17.0000 g | PACK | Freq: Two times a day (BID) | ORAL | Status: DC
  Administered 2024-04-15 (×2): 17 g via ORAL
  Filled 2024-04-15 (×3): qty 1

## 2024-04-15 MED ORDER — ORAL CARE MOUTH RINSE: 15.0000 mL | OROMUCOSAL | Status: DC | PRN

## 2024-04-15 NOTE — Plan of Care (Signed)
  Problem: Education: Goal: Knowledge of General Education information will improve Description: Including pain rating scale, medication(s)/side effects and non-pharmacologic comfort measures Outcome: Progressing   Problem: Health Behavior/Discharge Planning: Goal: Ability to manage health-related needs will improve Outcome: Progressing   Problem: Clinical Measurements: Goal: Ability to maintain clinical measurements within normal limits will improve Outcome: Progressing Goal: Will remain free from infection Outcome: Progressing Goal: Diagnostic test results will improve Outcome: Progressing Goal: Respiratory complications will improve Outcome: Progressing Goal: Cardiovascular complication will be avoided Outcome: Progressing   Problem: Activity: Goal: Risk for activity intolerance will decrease Outcome: Progressing   Problem: Nutrition: Goal: Adequate nutrition will be maintained Outcome: Progressing   Problem: Elimination: Goal: Will not experience complications related to bowel motility Outcome: Progressing Goal: Will not experience complications related to urinary retention Outcome: Progressing   Problem: Pain Managment: Goal: General experience of comfort will improve and/or be controlled Outcome: Progressing   Problem: Safety: Goal: Ability to remain free from injury will improve Outcome: Progressing   Problem: Skin Integrity: Goal: Risk for impaired skin integrity will decrease Outcome: Progressing   Problem: Education: Goal: Ability to demonstrate management of disease process will improve Outcome: Progressing Goal: Ability to verbalize understanding of medication therapies will improve Outcome: Progressing Goal: Individualized Educational Video(s) Outcome: Progressing   Problem: Activity: Goal: Capacity to carry out activities will improve Outcome: Progressing

## 2024-04-15 NOTE — Progress Notes (Signed)
 [facility] PROGRESS NOTE  Gregory Pearson (DOB: 08-Nov-1927) FMW:[id number] PCP: Dettinger, Fonda LABOR, MD  Brief Narrative: Gregory Pearson is a 89 y.o. male with medical history significant for GI bleed, cardiac murmur. Patient presented to the ED with complaints of difficulty breathing of 2 days duration, and swelling to his bilateral lower extremities, with abdominal bloating also of 2 days duration.  He has lower extremity swelling despite wearing compression stockings, and elevating his legs at night.  Denies orthopnea.  No chest pain.  No history of cardiac disease.  He denies cough.   ED Course: Temperature 100.3.  Heart rate 46-97.  Respiratory rate 17-28.  Blood pressure systolic 103-121. O2 sats 88 percent on room air. Placed on 2L. WBC 11.1.  BNP elevated at 790.  COVID influenza RSV negative. Chest x-ray shows multifocal new patchy airspace opacities, small bilateral pleural effusions. Procalcitonin undetectable.  Telemetry showing multiple PVCs. IV Lasix  40 mg x 1 given, and empiric ceftriaxone  and azithromycin  given though not continued with reassuring work up. Pneumonia ruled out.   Updated echocardiogram revealed increased severity of aortic stenosis for which cardiology was consulted. Plan is to restart aspirin  (stopped previously due to GI bleed) for a month to confirm no bleeding and follow up after that to pursue work up for TAVR as outpatient. We have continued IV diuresis with good diuretic effect and stable renal parameters. He remains hypoxemic.   Subjective: Another day of significantly increased urine output, feels his breathing is better. He admits it is not at his usual baseline, however. No fever or chills or productive cough, desaturated while ambulating yesterday. No chest pain. Hasn't had BM but not eating that much, no abd pain.   Objective: BP 104/71   Pulse 91   Temp 97.6 F (36.4 C) (Axillary)   Resp (!) 23   Ht 6' (1.829 m)   Wt 98.1 kg Comment: taken  in a standing scale  SpO2 92%   BMI 29.33 kg/m   Gen: No distress, elderly pleasant male Pulm: Tachypneic with normal effort, crackles at bases, no wheezing CV: RRR, stable III/VI systolic murmur at RUSB, trace edema.  GI: Soft, NT, ND, +BS  Neuro: Alert and oriented. No new focal deficits. Ext: Warm, no deformities Skin: No rashes, lesions or ulcers on visualized skin   Assessment & Plan: Acute hypoxic respiratory failure (HCC) O2 sats down to 88% on room air.  Likely due to congestive heart failure. Procalcitonin less than 0.1, WBC 11.1.  Afebrile. Hold off on further antibiotics at this time as pneumonia is felt to be ruled out. - Repeat ambulatory pulse oximetry prior to DC.   Acute diastolic CHF (congestive heart failure) (HCC) Presenting with 2+ pitting bilateral lower extremity edema, abdominal bloating. Chest x-ray shows multifocal new patchy airspace opacities, small bilateral pleural effusion.  Weight appears stable per chart.  BNP elevated at 790, no prior to compare.  Last echo 04/2023-EF 60 to 65%, grade 1 DD, moderate aortic stenosis.  Not on diuretics.  EKG showing multiple PVCs, nonsustained VT. - Continue lasix  40mg  IV BID, again had good diuretic response over last 24 hours (>3L), though weight not concordant. Still has crackles and newly hypoxemic, so will give another 24 hours of IV diuresis. - Strict input output, daily weight, daily BMP - Cardiac diet  Severe aortic stenosis:   - Preserved LVEF though AS worse on this echo, cardiology consulted. His functional status would suggest that he deserves consideration of TAVR. Plan to  start aspirin  to prove tolerance (hx GI bleed), then pursue work up as outpatient.  History of GI bleed Hemoglobin stable. Starting ASA 81mg  given plan for TAVR evaluation. If hgb stable and no bleeding, would proceed with work up and/or Tx.  - PPI daily - Monitor for bleeding on ASA 81mg .   Depression: Quiescent.  - Continue  SSRI  Constipation: Abd benign - Miralax  scheduled, milk of mag prn  Gregory KATHEE Come, MD [facility] [email or web address] 04/15/2024, 9:18 AM

## 2024-04-15 NOTE — Telephone Encounter (Signed)
-----   Message from Lorette CINDERELLA Kapur sent at 04/15/2024  2:37 PM EDT ----- Regarding: Structual heart referral Per Dr. Mallipeddi, please schedule 6 week hospital follow up appointment with Structural Heart for evaluation of severe aortic valve stenosis.   Thank you.

## 2024-04-15 NOTE — Progress Notes (Signed)
 Progress Note  Patient Name: Gregory Pearson Date of Encounter: 04/15/2024  Primary Cardiologist: None  Subjective   Continues to have SOB.  Has been walking in the hallway multiple times.  O2 sat dropping with ambulation on RA.  Currently on nasal cannula oxygen.  Inpatient Medications    Scheduled Meds:  aspirin   81 mg Oral Daily   Chlorhexidine  Gluconate Cloth  6 each Topical Q0600   enoxaparin  (LOVENOX ) injection  40 mg Subcutaneous Q24H   furosemide   40 mg Intravenous Q12H   metoprolol  tartrate  12.5 mg Oral BID   pantoprazole   40 mg Oral Daily   polyethylene glycol  17 g Oral BID   sertraline   50 mg Oral Daily   Continuous Infusions:  PRN Meds: acetaminophen  **OR** acetaminophen , lidocaine , magnesium  hydroxide, ondansetron  **OR** ondansetron  (ZOFRAN ) IV, traZODone , trolamine salicylate   Vital Signs    Vitals:   04/15/24 0500 04/15/24 0751 04/15/24 0800 04/15/24 0908  BP:  (!) 116/98 104/71 104/71  Pulse:  (!) 55 (!) 57 91  Resp:  18 (!) 23   Temp:  97.6 F (36.4 C)    TempSrc:  Axillary    SpO2:  95% 92%   Weight: 98.1 kg     Height:        Intake/Output Summary (Last 24 hours) at 04/15/2024 1004 Last data filed at 04/15/2024 0830 Gross per 24 hour  Intake 240 ml  Output 2075 ml  Net -1835 ml   Filed Weights   04/14/24 0435 04/14/24 0956 04/15/24 0500  Weight: 98.7 kg 97.9 kg 98.1 kg    Telemetry     Personally reviewed, frequent PVCs, NSR.  ECG    Not performed today.  Physical Exam   GEN: No acute distress.   Neck:  JVD+. Cardiac: RRR, no murmur, rub, or gallop.  Respiratory: Nonlabored. Clear to auscultation bilaterally. GI: Soft, nontender, bowel sounds present. MS: No edema; No deformity. Neuro:  Nonfocal. Psych: Alert and oriented x 3. Normal affect.  Labs    Chemistry Recent Labs  Lab 04/12/24 1417 04/13/24 0527 04/14/24 0431 04/15/24 0220  NA 136 137 137 134*  K 3.5 3.9 3.7 3.9  CL 94* 99 96* 94*  CO2 29 29 30 30    GLUCOSE 101* 118* 120* 118*  BUN 23 22 21 23   CREATININE 0.75 0.69 0.67 0.66  CALCIUM  8.7* 8.1* 8.2* 8.1*  PROT 6.8  --   --   --   ALBUMIN 3.4*  --   --   --   AST 28  --   --   --   ALT 29  --   --   --   ALKPHOS 95  --   --   --   BILITOT 0.8  --   --   --   GFRNONAA >60 >60 >60 >60  ANIONGAP 13 9 11 10      Hematology Recent Labs  Lab 04/12/24 1417 04/13/24 0527  WBC 11.1* 10.3  RBC 4.65 4.22  HGB 14.5 13.3  HCT 44.8 40.8  MCV 96.3 96.7  MCH 31.2 31.5  MCHC 32.4 32.6  RDW 14.2 14.3  PLT 179 173    Cardiac EnzymesNo results for input(s): TROPONINIHS in the last 720 hours.  BNP Recent Labs  Lab 04/12/24 1417  BNP 790.0*     DDimerNo results for input(s): DDIMER in the last 168 hours.   Assessment & Plan   Acute on chronic diastolic heart failure exacerbation Severe aortic valve  stenosis - Presented with orthopnea, DOE, bilateral leg swelling x 1 month that has worsened in the last 3 days prior to presentation. BNP 790.  Chest x-ray showed pulmonary vascular congestion.  Continue IV Lasix  40 mg twice daily, 2.4 L urine output in the last 24 hours with net -2.4 L.  He still has shortness of breath but HR improved.  Labs improving.  He is walking in the hallway multiple times but his oxygen saturation dropped with ambulation, on room air.  Currently on nasal cannula oxygen.  Hopefully can switch to p.o. diuretics tomorrow, he likely will need home O2 on discharge. - Echocardiogram this admission showed severe aortic valve stenosis (moderate AS in September 2024).valve area 0.85 cm, mean PG 40.7 mmHg, V-max 3.94 m/s, DVI 0.23 now.  Physical exam showed findings consistent with severe AS as well. - He is reasonably active at his age, goes out for dancing etc. and not sedentary. - Ideally, he will benefit from TAVR due to decent functional status, and hence LHC/RHC, CT cardiac TAVR protocol. - However per chart review, he had history of UGIB from gastric ulcer  disease secondary to NSAID use, underwent EGD in March and July 2025, GI recommended to stay away from NSAIDs indefinitely.  Continue aspirin  81 mg once daily, CBC pending today. He was supposed to be on pantoprazole  40 mg once daily per GI recommendations, not on it at home.  Started him on p.o. pantoprazole  40 mg once daily this hospital admission.  If he does not bleed or/and keeps his hemoglobin stable for the next 1 month, he likely will be a candidate for TAVR.  If not, palliative care will need to be involved.  Will follow-up on this in the outpatient setting.  CBC in 1 month, general cardiology follow-up in 1 month and structural heart clinic in 6 weeks.  Patient and family agreeable to this plan. - Patient is currently DNR however he did agree to reverse his CODE STATUS to full code for the invasive cardiac procedures.   Frequent PVCs/couplets/NSVT - Likely secondary to severe AS, acute on chronic diastolic heart failure - Started metoprolol  tartrate 12.5 mg twice daily, PVCs improved. - Echocardiogram showed normal LVEF.  Signed, Treasure Ingrum SHAUNNA Maywood, MD  04/15/2024, 10:04 AM

## 2024-04-15 NOTE — Telephone Encounter (Signed)
 Referral placed.

## 2024-04-15 NOTE — Progress Notes (Signed)
 Heart Failure Nurse Navigator Progress Note  PCP: Dettinger, Fonda LABOR, MD PCP-Cardiologist: Vishnu Priya Mallipeddi, MD Admission Diagnosis: Acute on chronic congestive heart failure, unspecified heart failure type (HCC) Pneumonia due to infectious organism, unspecified laterality, unspecified part of lung Admitted from: Home  [facility]: Navigator reached out to patient remotely via telephone to his room IC07 @ [facility].  2 Patient identifiers confirmed prior to Heart Failure Education.   Presentation:   Gregory Pearson presented with shortness of breath worsening over the last 2 days.  Also had some lower extremity selling that he though was worse. He thought his abdomen was more swollen and that he had been gaining weight as well.  No cough, fever or chills.  He had been rubbing castor oil on his skin for wrinkles and thought it may be related. He was not taking a diuretic. Hx: significant for GI bleed, cardiac murmur. Telemetry showing multiple PVCs.  BNP 790.0. Chest x-ray: multifocal new patchy airspace opacities/nodules and small bilateral pleural effusions.    ECHO/ LVEF: 60-65% Normal LVEF, Severe aortic valve stenosis (moderate AS in September 2024).  Clinical Course:  Past Medical History:  Diagnosis Date   Asbestos exposure    Blood transfusion without reported diagnosis    after ruptured appendix 1963   Diverticulosis    Gastric ulcer 06/2019   GERD (gastroesophageal reflux disease)    Heart murmur    History of stomach ulcers 1995   H.pylori s/p treatment   History of stomach ulcers    Hyperlipidemia    Muscle strain of right gluteal region 01/24/2014   Prostate cancer (HCC) 2002   prostatectomy   Traumatic hematoma of buttock 01/24/2014     Social History   Socioeconomic History   Marital status: Widowed    Spouse name: Not on file   Number of children: 3   Years of education: Not on file   Highest education level: 12th grade  Occupational  History   Occupation: retired Psychiatric nurse power  Tobacco Use   Smoking status: Never   Smokeless tobacco: Never  Vaping Use   Vaping status: Never Used  Substance and Sexual Activity   Alcohol use: Not Currently    Comment: last drank about 15 years ago   Drug use: Never   Sexual activity: Not on file  Other Topics Concern   Not on file  Social History Narrative   3 children.   Widowed since 01/16/2021. Married x 61 years.    Social Drivers of Corporate investment banker Strain: Low Risk  (04/15/2024)   Overall Financial Resource Strain (CARDIA)    Difficulty of Paying Living Expenses: Not hard at all  Food Insecurity: No Food Insecurity (04/15/2024)   Hunger Vital Sign    Worried About Running Out of Food in the Last Year: Never true    Ran Out of Food in the Last Year: Never true  Transportation Needs: No Transportation Needs (04/15/2024)   PRAPARE - Administrator, Civil Service (Medical): No    Lack of Transportation (Non-Medical): No  Physical Activity: Inactive (12/23/2023)   Exercise Vital Sign    Days of Exercise per Week: 0 days    Minutes of Exercise per Session: 20 min  Stress: No Stress Concern Present (12/23/2023)   [employer] of Occupational Health - Occupational Stress Questionnaire    Feeling of Stress : Not at all  Social Connections: Moderately Integrated (04/12/2024)   Social Connection and Isolation  Panel    Frequency of Communication with Friends and Family: More than three times a week    Frequency of Social Gatherings with Friends and Family: More than three times a week    Attends Religious Services: More than 4 times per year    Active Member of [employer] or Organizations: Yes    Attends Banker Meetings: More than 4 times per year    Marital Status: Widowed   Education Assessment and Provision:  Detailed education and instructions provided on heart failure disease management including the following:  Signs and symptoms of Heart  Failure When to call the physician Importance of daily weights Low sodium diet Fluid restriction Medication management Anticipated future follow-up appointments  Patient education given on each of the above topics.  Patient acknowledges understanding via teach back method and acceptance of all instructions.  Education Materials:  Living Better With Heart Failure Booklet, HF zone tool, & Daily Weight Tracker Tool.  Patient has scale at home: Yes Patient has pill box at home: Yes    High Risk Criteria for Readmission and/or Poor Patient Outcomes: Heart failure hospital admissions (last 6 months): 1  No Show rate: 1% Difficult social situation: None Demonstrates medication adherence: Yes Primary Language: English Literacy level: Reading, Writing & Comprehension  Barriers of Care:   Corrina Jolly will drive him to his [facility] appointment [facility]  Considerations/Referrals:   Referral made to Heart Failure Pharmacist Stewardship: No Referral made to Heart Failure CSW/NCM TOC: No Referral made to Heart & Vascular TOC clinic: Yes.  Items for Follow-up on DC/TOC: Diet & Fluid Restrictions Daily Weights Continued Heart Failure Education  Per MD note if he does not bleed and keeps his hemoglobin stable for the 1 month, he will likely be a candidate for TAVR.  General cardiolgy follow-up in 1 month and structural heart clinic in 6 weeks.  AHF TOC scheudled for 1 week.  Charmaine Pines, RN, BSN [facility] Heart Failure Navigator Secure Chat Only

## 2024-04-16 ENCOUNTER — Telehealth: Payer: Self-pay | Admitting: Physician Assistant

## 2024-04-16 ENCOUNTER — Other Ambulatory Visit: Payer: Self-pay

## 2024-04-16 ENCOUNTER — Inpatient Hospital Stay

## 2024-04-16 DIAGNOSIS — I493 Ventricular premature depolarization: Secondary | ICD-10-CM | POA: Diagnosis not present

## 2024-04-16 DIAGNOSIS — I5033 Acute on chronic diastolic (congestive) heart failure: Secondary | ICD-10-CM | POA: Diagnosis not present

## 2024-04-16 DIAGNOSIS — J9601 Acute respiratory failure with hypoxia: Secondary | ICD-10-CM | POA: Diagnosis not present

## 2024-04-16 DIAGNOSIS — I350 Nonrheumatic aortic (valve) stenosis: Secondary | ICD-10-CM | POA: Diagnosis not present

## 2024-04-16 DIAGNOSIS — I4729 Other ventricular tachycardia: Secondary | ICD-10-CM

## 2024-04-16 LAB — CBC
HCT: 37.9 % — ABNORMAL LOW (ref 39.0–52.0)
Hemoglobin: 12.3 g/dL — ABNORMAL LOW (ref 13.0–17.0)
MCH: 31.2 pg (ref 26.0–34.0)
MCHC: 32.5 g/dL (ref 30.0–36.0)
MCV: 96.2 fL (ref 80.0–100.0)
Platelets: 207 K/uL (ref 150–400)
RBC: 3.94 MIL/uL — ABNORMAL LOW (ref 4.22–5.81)
RDW: 13.5 % (ref 11.5–15.5)
WBC: 8.1 K/uL (ref 4.0–10.5)
nRBC: 0 % (ref 0.0–0.2)

## 2024-04-16 LAB — BASIC METABOLIC PANEL WITH GFR
Anion gap: 9 (ref 5–15)
BUN: 25 mg/dL — ABNORMAL HIGH (ref 8–23)
CO2: 29 mmol/L (ref 22–32)
Calcium: 8 mg/dL — ABNORMAL LOW (ref 8.9–10.3)
Chloride: 92 mmol/L — ABNORMAL LOW (ref 98–111)
Creatinine, Ser: 0.71 mg/dL (ref 0.61–1.24)
GFR, Estimated: >60 mL/min
Glucose, Bld: 105 mg/dL — ABNORMAL HIGH (ref 70–99)
Potassium: 3.9 mmol/L (ref 3.5–5.1)
Sodium: 130 mmol/L — ABNORMAL LOW (ref 135–145)

## 2024-04-16 MED ORDER — TORSEMIDE 20 MG PO TABS
20.0000 mg | ORAL_TABLET | Freq: Every day | ORAL | Status: DC
  Administered 2024-04-16: 20 mg via ORAL
  Filled 2024-04-16: qty 1

## 2024-04-16 MED ORDER — METOPROLOL TARTRATE 25 MG PO TABS: 12.5000 mg | ORAL_TABLET | Freq: Two times a day (BID) | ORAL | 5 refills | Status: DC

## 2024-04-16 MED ORDER — ASPIRIN 81 MG PO CHEW: 81.0000 mg | CHEWABLE_TABLET | Freq: Every day | ORAL | 5 refills | Status: DC

## 2024-04-16 MED ORDER — FUROSEMIDE 10 MG/ML IJ SOLN
40.0000 mg | Freq: Once | INTRAMUSCULAR | Status: AC
  Administered 2024-04-16: 40 mg via INTRAVENOUS
  Filled 2024-04-16: qty 4

## 2024-04-16 MED ORDER — PANTOPRAZOLE SODIUM 40 MG PO TBEC: 40.0000 mg | DELAYED_RELEASE_TABLET | Freq: Every day | ORAL | 5 refills | Status: DC

## 2024-04-16 MED ORDER — TORSEMIDE 20 MG PO TABS: 20.0000 mg | ORAL_TABLET | Freq: Every day | ORAL | 5 refills | Status: DC

## 2024-04-16 NOTE — Discharge Summary (Signed)
 Gregory Pearson, is a 89 y.o. male  DOB 05/31/1928  MRN [id number].  Admission date:  04/12/2024  Admitting Physician  Tully FORBES Carwin, MD  Discharge Date:  04/16/2024   Primary MD  Dettinger, Fonda LABOR, MD  Recommendations for primary care physician for things to follow:  1)you need oxygen at home at 2 L via nasal cannula continuously while awake and while asleep--- smoking or having open fires around oxygen can cause fire, significant injury and death  2)Very Low-salt diet advised---Less than 2 gm of Sodium per day advised----ok to use Mrs DASH salt substitute instead of Salt 3)Weigh yourself daily, call if you gain more than 3 pounds in 1 day or more than 5 pounds in 1 week as your diuretic medications may need to be adjusted 4)Limit your Fluid  intake to No more than 60 ounces (1.8 Liters) per day 5)Avoid ibuprofen /Advil /Aleve/Motrin Josefine Powders/Naproxen/BC powders/Meloxicam /Diclofenac /Indomethacin and other Nonsteroidal anti-inflammatory medications as these will make you more likely to bleed and can cause stomach ulcers, can also cause Kidney problems.  6) outpatient follow-up with structural heart team for evaluation of possible aortic valve repair advised 7) please consider referral to palliative care medicine if you are deemed not to be a good candidate for aortic valve repair  Admission Diagnosis  Pneumonia due to infectious organism, unspecified laterality, unspecified part of lung [J18.9] Acute on chronic congestive heart failure, unspecified heart failure type (HCC) [I50.9] Acute hypoxic respiratory failure (HCC) [J96.01]   Discharge Diagnosis  Pneumonia due to infectious organism, unspecified laterality, unspecified part of lung [J18.9] Acute on chronic congestive heart failure, unspecified heart failure type (HCC) [I50.9] Acute hypoxic respiratory failure (HCC) [J96.01]    Principal Problem:    Acute hypoxic respiratory failure (HCC) Active Problems:   Acute on chronic diastolic heart failure (HCC)   History of GI bleed   Severe aortic valve stenosis   Frequent PVCs      Past Medical History:  Diagnosis Date   Asbestos exposure    Blood transfusion without reported diagnosis    after ruptured appendix 1963   Diverticulosis    Gastric ulcer 06/2019   GERD (gastroesophageal reflux disease)    Heart murmur    History of stomach ulcers 1995   H.pylori s/p treatment   History of stomach ulcers    Hyperlipidemia    Muscle strain of right gluteal region 01/24/2014   Prostate cancer (HCC) 2002   prostatectomy   Traumatic hematoma of buttock 01/24/2014    Past Surgical History:  Procedure Laterality Date   APPENDECTOMY  1963   ruptured appendix/gangrene   BIOPSY  07/06/2019   Procedure: BIOPSY;  Surgeon: Shaaron Lamar HERO, MD;  [facility];  Service: Endoscopy;;   CATARACT EXTRACTION W/PHACO Left 04/04/2023   Procedure: CATARACT EXTRACTION PHACO AND INTRAOCULAR LENS PLACEMENT (IOC);  Surgeon: Juli Blunt, MD;  Location: [facility];  Service: Ophthalmology;  Laterality: Left;  CDE  5.20   CATARACT EXTRACTION W/PHACO Right 04/18/2023   Procedure: CATARACT EXTRACTION PHACO AND INTRAOCULAR LENS  PLACEMENT (IOC);  Surgeon: Juli Blunt, MD;  Location: [facility];  Service: Ophthalmology;  Laterality: Right;  CDE 12.07   COLONOSCOPY  2013   Dr. Aneita: multiple colon polyps (tubular adenomas), diverticulosis. no further surveillance colonoscopies due to age.   ESOPHAGOGASTRODUODENOSCOPY  8004,8004   Dr. Aneita: gastric ulcer (h.pylori + s/p tx), follow up EGD verified ulcer healing   ESOPHAGOGASTRODUODENOSCOPY N/A 09/26/2016   Procedure: ESOPHAGOGASTRODUODENOSCOPY (EGD);  Surgeon: Lamar CHRISTELLA Hollingshead, MD;  [facility];  Service: Endoscopy;  Laterality: N/A;   ESOPHAGOGASTRODUODENOSCOPY N/A 01/23/2017   Procedure: ESOPHAGOGASTRODUODENOSCOPY (EGD);  Surgeon:  Hollingshead Lamar CHRISTELLA, MD;  [facility];  Service: Endoscopy;  Laterality: N/A;  12:15pm   ESOPHAGOGASTRODUODENOSCOPY N/A 07/06/2019   Procedure: ESOPHAGOGASTRODUODENOSCOPY (EGD);  Surgeon: Hollingshead Lamar CHRISTELLA, MD; normal esophagus, nonbleeding gastric ulcer with pigmented material, abnormal gastric mucosa of uncertain significance s/p biopsy, normal duodenum.  Pathology with mild reactive gastropathy, mild chronic gastritis, H. pylori negative.   ESOPHAGOGASTRODUODENOSCOPY N/A 10/20/2019   Procedure: ESOPHAGOGASTRODUODENOSCOPY (EGD);  Surgeon: Hollingshead Lamar CHRISTELLA, MD;  [facility];  Service: Endoscopy;  Laterality: N/A;  9:30am   ESOPHAGOGASTRODUODENOSCOPY N/A 11/19/2023   Surgeon: Hollingshead Lamar CHRISTELLA, MD; 8 mm satellite shaped gastric ulcer.  Gastric biopsies negative for H. pylori   ESOPHAGOGASTRODUODENOSCOPY N/A 03/10/2024   Procedure: EGD (ESOPHAGOGASTRODUODENOSCOPY);  Surgeon: Hollingshead Lamar CHRISTELLA, MD;  [facility];  Service: Endoscopy;  Laterality: N/A;  2:00 pm, asa 3   HEMORRHOID SURGERY  1970   LAPAROSCOPIC CHOLECYSTECTOMY  2003   PROSTATECTOMY  2002       HPI  from the history and physical done on the day of admission:    Chief Complaint: Difficulty Breathing   HPI: Gregory Pearson is a 89 y.o. male with medical history significant for GI bleed, cardiac murmur. Patient presented to the ED with complaints of difficulty breathing of 2 days duration, and swelling to his bilateral lower extremities, with abdominal bloating also of 2 days duration.  He has lower extremity swelling despite wearing compression stockings, and elevating his legs at night.  Denies orthopnea.  No chest pain.  No history of cardiac disease.  He denies cough.   ED Course: Temperature 100.3.  Heart rate 46-97.  Respiratory rate 17-28.  Blood pressure systolic 103-121. O2 sats 88 percent on room air. Placed on 2L. WBC 11.1.  BNP elevated at 790.  COVID influenza RSV negative. Chest x-ray shows  multifocal new patchy airspace opacities, small bilateral pleural effusions. Procalcitonin pending.  Telemetry showing multiple PVCs. IV Lasix  40 mg x 1 given, and empiric ceftriaxone  and azithromycin  given prophylactically in case of concomitant pneumonia.   Review of Systems: As per HPI all other systems reviewed and negative.   Hospital Course:   1)Acute hypoxic respiratory failure -acute dCHF in the setting of severe aortic stenosis -Qualifies for home O2 - okay to discharge on 2 L of oxygen via nasal cannula   2)Acute diastolic CHF (congestive heart failure) -in the setting of severe aortic stenosis Chest x-ray consistent with CHF with pleural effusions - BNP elevated at 790, no prior to compare.   -Last echo 04/2023-EF 60 to 65%, grade 1 DD, moderate aortic stenosis.  Not on diuretics.  EKG showing multiple PVCs, nonsustained VT. -Repeat echo with EF of 50 to 55%, no mitral valve stenosis, severe aortic stenosis noted -Cardiology consult appreciated - Treated with IV Lasix  okay to discharge on p.o. torsemide    3)Severe aortic stenosis--worse compared to  prior echo  - Preserved LVEF though AS worse on this echo, -- cardiology consult appreciated  - Referral to structural heart cardiology for evaluation for possible TAVR advised  - Continue aspirin  81 mg daily (hx GI bleed) -If hgb stable and no bleeding he may be able to undergo further evaluation for TAVR   4)History of GI bleed Hemoglobin stable. Started ASA 81mg  given plan for TAVR evaluation.  -If hgb stable and no bleeding he may be able to undergo further evaluation for TAVR -Continue Protonix   Depression: Quiescent.  - Continue SSRI   Constipation: Abd benign As needed laxatives  Discharge Condition: Stable  Follow UP   Follow-up Information     [facility]. Go on 04/21/2024.   Specialty: Cardiology Why: Hospital Follow-Up 04/21/24 @ 9:00 AM Please bring all  medications to follow-up appointment Advanced Heart Failure [facility] *Your looking for Entrance C off of [address] [employer] Parking at the door or use [employer] Code [phone] to park under the building. Contact information: [address] [place]  [address] [phone]        Johnson Laymon HERO, PA-C Follow up on 05/13/2024.   Specialties: Cardiology, Cardiology Why: Follow up with cardiology on 05/13/2024 at 2:30 pm Contact information: [address] [place] [address] [phone]         [facility] Follow up.   Why: Please come to the [facility] lab the week of 8/15 during business hours to have your blood count drawn (CBC) - you do not need to be fasting for this test. Dr. Mallipeddi recommends you have this done BEFORE the day of your 05/13/24 follow-up visit.   The cardiology office will also be mailing you a heart monitor to wear for 2 weeks. It will come with instructions for how to apply and mail back. Call the office if you have any questions. Contact information: [address] [place]  [address] [phone]                Consults obtained -cardiology  Diet and Activity recommendation:  As advised  Discharge Instructions    Discharge Instructions     Call MD for:  difficulty breathing, headache or visual disturbances   Complete by: As directed    Call MD for:  persistant dizziness or light-headedness   Complete by: As directed    Call MD for:  persistant nausea and vomiting   Complete by: As directed    Call MD for:  redness, tenderness, or signs of infection (pain, swelling, redness, odor or green/yellow discharge around incision site)   Complete by: As directed    Call MD for:  temperature >100.4   Complete by: As directed    Diet - low sodium heart healthy   Complete by: As directed    Discharge instructions   Complete by: As directed    1)you need oxygen at home at 2 L  via nasal cannula continuously while awake and while asleep--- smoking or having open fires around oxygen can cause fire, significant injury and death  2)Very Low-salt diet advised---Less than 2 gm of Sodium per day advised----ok to use Mrs DASH salt substitute instead of Salt 3)Weigh yourself daily, call if you gain more than 3 pounds in 1 day or more than 5 pounds in 1 week as your diuretic medications may need to be adjusted 4)Limit your Fluid  intake to No more than 60  ounces (1.8 Liters) per day 5)Avoid ibuprofen /Advil /Aleve/Motrin Josefine Powders/Naproxen/BC powders/Meloxicam /Diclofenac /Indomethacin and other Nonsteroidal anti-inflammatory medications as these will make you more likely to bleed and can cause stomach ulcers, can also cause Kidney problems.  6) outpatient follow-up with structural heart team for evaluation of possible aortic valve repair advised 7) please consider referral to palliative care medicine if you are deemed not to be a good candidate for aortic valve repair   Increase activity slowly   Complete by: As directed         Discharge Medications     Allergies as of 04/16/2024       Reactions   Nsaids Other (See Comments)   GI bleed, PUD        Medication List     STOP taking these medications    aspirin -sod bicarb-citric acid 325 MG Tbef tablet Commonly known as: ALKA-SELTZER       TAKE these medications    acetaminophen  500 MG tablet Commonly known as: TYLENOL  Take 1 tablet (500 mg total) by mouth every 6 (six) hours as needed. What changed:  how much to take when to take this   ascorbic acid 500 MG tablet Commonly known as: VITAMIN C Take 500 mg by mouth daily.   aspirin  81 MG chewable tablet Chew 1 tablet (81 mg total) by mouth daily with breakfast.   fluticasone  50 MCG/ACT nasal spray Commonly known as: FLONASE  Place 1 spray into both nostrils 2 (two) times daily as needed for allergies or rhinitis.   magnesium  84 MG ( ) Tbcr SR  tablet Commonly known as: MAGTAB Take 84 mg by mouth daily.   metoprolol  tartrate 25 MG tablet Commonly known as: LOPRESSOR  Take 0.5 tablets (12.5 mg total) by mouth 2 (two) times daily.   multivitamin tablet Take 1 tablet by mouth daily.   pantoprazole  40 MG tablet Commonly known as: PROTONIX  Take 1 tablet (40 mg total) by mouth daily. Start taking on: April 17, 2024   sertraline  100 MG tablet Commonly known as: Zoloft  Take 1 tablet (100 mg total) by mouth daily. What changed: how much to take   torsemide  20 MG tablet Commonly known as: DEMADEX  Take 1 tablet (20 mg total) by mouth daily. Start taking on: April 17, 2024   traZODone  100 MG tablet Commonly known as: DESYREL  Take 1 tablet (100 mg total) by mouth at bedtime as needed. for sleep What changed:  reasons to take this additional instructions   vitamin B-12 100 MCG tablet Commonly known as: CYANOCOBALAMIN Take 100 mcg by mouth daily.   VITAMIN B-2 PO Take 1 tablet by mouth daily.               Durable Medical Equipment  (From admission, onward)           Start     Ordered   04/16/24 1348  For home use only DME oxygen  Once       Comments: SATURATION QUALIFICATIONS: (This note is used to comply with regulatory documentation for home oxygen)   Patient Saturations on Room Air at Rest =91%   Patient Saturations on Room Air while Ambulating =87%   Patient Saturations on 2 Liters of oxygen while Ambulating = 95%   Please briefly explain why patient needs home oxygen: Desats on room air  Dx--congestive heart failure  Question Answer Comment  Length of Need Lifetime   Mode or (Route) Nasal cannula   Liters per Minute 2   Frequency Continuous (stationary and portable oxygen unit needed)  Oxygen conserving device Yes   Oxygen delivery system Gas      04/16/24 1349           Major procedures and Radiology Reports - PLEASE review detailed and final reports for all details, in brief -    ECHOCARDIOGRAM COMPLETE Result Date: 04/13/2024    ECHOCARDIOGRAM REPORT   Patient Name:   Gregory Pearson Date of Exam: 04/13/2024 Medical Rec #:  [id number]      Height:       72.0 in Accession #:    [id number]     Weight:       198.6 lb Date of Birth:  1927/10/17       BSA:          2.124 m Patient Age:    89 years       BP:           110/43 mmHg Patient Gender: M              HR:           73 bpm. Exam Location:  [facility] Procedure: 2D Echo, Cardiac Doppler and Color Doppler (Both Spectral and Color            Flow Doppler were utilized during procedure). Indications:    Congestive Heart Failure I50.9  History:        Patient has prior history of Echocardiogram examinations. CHF,                 Signs/Symptoms:Systolic Murmur; Risk Factors:Dyslipidemia.  Sonographer:    Aida Pizza RCS Referring Phys: [phone] EJIROGHENE E Ronald Vinsant IMPRESSIONS  1. Left ventricular ejection fraction, by estimation, is 60 to 65%. The left ventricle has normal function. Left ventricular endocardial border not optimally defined to evaluate regional wall motion. Left ventricular diastolic function could not be evaluated.  2. Right ventricular systolic function is normal. The right ventricular size is normal. Tricuspid regurgitation signal is inadequate for assessing PA pressure.  3. Left atrial size was severely dilated.  4. The mitral valve is abnormal. Trivial mitral valve regurgitation. No evidence of mitral stenosis. Severe mitral annular calcification.  5. The aortic valve is tricuspid. There is severe calcifcation of the aortic valve. There is severe thickening of the aortic valve. Aortic valve regurgitation is mild. Severe aortic valve stenosis. Aortic valve area, by VTI measures 0.85 cm. Aortic valve mean gradient measures 40.7 mmHg. Aortic valve Vmax measures 3.94 m/s. DVI is 0.23.  6. Aortic dilatation noted. There is borderline dilatation of the aortic root, measuring 39 mm. FINDINGS  Left Ventricle: Left ventricular  ejection fraction, by estimation, is 60 to 65%. The left ventricle has normal function. Left ventricular endocardial border not optimally defined to evaluate regional wall motion. Strain was performed and the global longitudinal strain is indeterminate. The left ventricular internal cavity size was normal in size. There is no left ventricular hypertrophy. Left ventricular diastolic function could not be evaluated due to mitral annular calcification (moderate or greater). Left ventricular diastolic function could not be evaluated. Right Ventricle: The right ventricular size is normal. No increase in right ventricular wall thickness. Right ventricular systolic function is normal. Tricuspid regurgitation signal is inadequate for assessing PA pressure. Left Atrium: Left atrial size was severely dilated. Right Atrium: Right atrial size was normal in size. Pericardium: There is no evidence of pericardial effusion. Mitral Valve: The mitral valve is abnormal. Severe mitral annular calcification. Trivial mitral valve regurgitation. No evidence of mitral valve  stenosis. Tricuspid Valve: The tricuspid valve is normal in structure. Tricuspid valve regurgitation is mild . No evidence of tricuspid stenosis. Aortic Valve: The aortic valve is tricuspid. There is severe calcifcation of the aortic valve. There is severe thickening of the aortic valve. Aortic valve regurgitation is mild. Severe aortic stenosis is present. Aortic valve mean gradient measures 40.7  mmHg. Aortic valve peak gradient measures 62.2 mmHg. Aortic valve area, by VTI measures 0.85 cm. Pulmonic Valve: The pulmonic valve was not well visualized. Pulmonic valve regurgitation is trivial. No evidence of pulmonic stenosis. Aorta: Aortic dilatation noted. There is borderline dilatation of the aortic root, measuring 39 mm. Venous: The inferior vena cava was not well visualized. IAS/Shunts: The interatrial septum was not well visualized. Additional Comments: 3D was  performed not requiring image post processing on an independent workstation and was indeterminate.  LEFT VENTRICLE PLAX 2D LVIDd:         5.05 cm   Diastology LVIDs:         2.90 cm   LV e' medial:    6.53 cm/s LV PW:         1.00 cm   LV E/e' medial:  21.1 LV IVS:        1.10 cm   LV e' lateral:   10.70 cm/s LVOT diam:     2.00 cm   LV E/e' lateral: 12.9 LV SV:         79 LV SV Index:   37 LVOT Area:     3.14 cm  RIGHT VENTRICLE RV S prime:     14.00 cm/s TAPSE (M-mode): 2.6 cm LEFT ATRIUM              Index        RIGHT ATRIUM           Index LA diam:        4.40 cm  2.07 cm/m   RA Area:     21.20 cm LA Vol (A2C):   124.0 ml 58.37 ml/m  RA Volume:   58.40 ml  27.49 ml/m LA Vol (A4C):   99.6 ml  46.88 ml/m LA Biplane Vol: 112.0 ml 52.72 ml/m  AORTIC VALVE AV Area (Vmax):    0.83 cm AV Area (Vmean):   0.78 cm AV Area (VTI):     0.85 cm AV Vmax:           394.33 cm/s AV Vmean:          308.333 cm/s AV VTI:            0.933 m AV Peak Grad:      62.2 mmHg AV Mean Grad:      40.7 mmHg LVOT Vmax:         103.87 cm/s LVOT Vmean:        76.467 cm/s LVOT VTI:          0.251 m LVOT/AV VTI ratio: 0.27  AORTA Ao Root diam: 3.90 cm MITRAL VALVE MV Area (PHT): 4.80 cm     SHUNTS MV Decel Time: 158 msec     Systemic VTI:  0.25 m MV E velocity: 138.00 cm/s  Systemic Diam: 2.00 cm MV A velocity: 96.40 cm/s MV E/A ratio:  1.43 Vishnu Priya Mallipeddi Electronically signed by Diannah Late Mallipeddi Signature Date/Time: 04/13/2024/4:39:57 PM    Final    DG Chest 2 View Result Date: 04/12/2024 CLINICAL DATA:  Shortness of breath EXAM: CHEST - 2 VIEW COMPARISON:  November 18, 2023 FINDINGS: Multiple patchy airspace opacities throughout both lungs, new to prior which may represent infection/inflammation versus edema. New small bilateral pleural effusion. Cardiomediastinal silhouette is obscured by patchy pulmonary opacities. Aortic knob is calcified and prominent. No acute osseous abnormality. IMPRESSION: Multifocal new patchy  airspace opacities/nodules and small bilateral pleural effusions. Electronically Signed   By: Megan  Zare M.D.   On: 04/12/2024 15:29    Micro Results   Recent Results (from the past 240 hours)  Resp panel by RT-PCR (RSV, Flu A&B, Covid) Anterior Nasal Swab     Status: None   Collection Time: 04/12/24  2:22 PM   Specimen: Anterior Nasal Swab  Result Value Ref Range Status   SARS Coronavirus 2 by RT PCR NEGATIVE NEGATIVE Final    Comment: (NOTE) SARS-CoV-2 target nucleic acids are NOT DETECTED.  The SARS-CoV-2 RNA is generally detectable in upper respiratory specimens during the acute phase of infection. The lowest concentration of SARS-CoV-2 viral copies this assay can detect is 138 copies/mL. A negative result does not preclude SARS-Cov-2 infection and should not be used as the sole basis for treatment or other patient management decisions. A negative result may occur with  improper specimen collection/handling, submission of specimen other than nasopharyngeal swab, presence of viral mutation(s) within the areas targeted by this assay, and inadequate number of viral copies(<138 copies/mL). A negative result must be combined with clinical observations, patient history, and epidemiological information. The expected result is Negative.  Fact Sheet for Patients:  BloggerCourse.com  Fact Sheet for Healthcare Providers:  SeriousBroker.it  This test is no t yet approved or cleared by the [place]  FDA and  has been authorized for detection and/or diagnosis of SARS-CoV-2 by FDA under an Emergency Use Authorization (EUA). This EUA will remain  in effect (meaning this test can be used) for the duration of the COVID-19 declaration under Section 564(b)(1) of the Act, 21 U.S.C.section 360bbb-3(b)(1), unless the authorization is terminated  or revoked sooner.       Influenza A by PCR NEGATIVE NEGATIVE Final   Influenza B by PCR  NEGATIVE NEGATIVE Final    Comment: (NOTE) The Xpert Xpress SARS-CoV-2/FLU/RSV plus assay is intended as an aid in the diagnosis of influenza from Nasopharyngeal swab specimens and should not be used as a sole basis for treatment. Nasal washings and aspirates are unacceptable for Xpert Xpress SARS-CoV-2/FLU/RSV testing.  Fact Sheet for Patients: BloggerCourse.com  Fact Sheet for Healthcare Providers: SeriousBroker.it  This test is not yet approved or cleared by the [place]  FDA and has been authorized for detection and/or diagnosis of SARS-CoV-2 by FDA under an Emergency Use Authorization (EUA). This EUA will remain in effect (meaning this test can be used) for the duration of the COVID-19 declaration under Section 564(b)(1) of the Act, 21 U.S.C. section 360bbb-3(b)(1), unless the authorization is terminated or revoked.     Resp Syncytial Virus by PCR NEGATIVE NEGATIVE Final    Comment: (NOTE) Fact Sheet for Patients: BloggerCourse.com  Fact Sheet for Healthcare Providers: SeriousBroker.it  This test is not yet approved or cleared by the [place]  FDA and has been authorized for detection and/or diagnosis of SARS-CoV-2 by FDA under an Emergency Use Authorization (EUA). This EUA will remain in effect (meaning this test can be used) for the duration of the COVID-19 declaration under Section 564(b)(1) of the Act, 21 U.S.C. section 360bbb-3(b)(1), unless the authorization is terminated or revoked.  Performed at [facility], [address] [place], [place] [address]  Blood culture (routine x 2)     Status: None (Preliminary result)   Collection Time: 04/12/24  4:31 PM   Specimen: BLOOD  Result Value Ref Range Status   Specimen Description BLOOD LEFT ANTECUBITAL  Final   Special Requests   Final    BOTTLES DRAWN AEROBIC AND ANAEROBIC Blood Culture adequate volume    Culture   Final    NO GROWTH 4 DAYS Performed at [facility], [address] [place], [place] [address]    Report Status PENDING  Incomplete  Blood culture (routine x 2)     Status: None (Preliminary result)   Collection Time: 04/12/24  4:38 PM   Specimen: BLOOD  Result Value Ref Range Status   Specimen Description BLOOD RIGHT ANTECUBITAL  Final   Special Requests   Final    BOTTLES DRAWN AEROBIC AND ANAEROBIC Blood Culture adequate volume   Culture   Final    NO GROWTH 4 DAYS Performed at [facility], [address], [place], [place] [address]    Report Status PENDING  Incomplete  Respiratory (~20 pathogens) panel by PCR     Status: None   Collection Time: 04/12/24  6:10 PM   Specimen: Nasopharyngeal Swab; Respiratory  Result Value Ref Range Status   Adenovirus NOT DETECTED NOT DETECTED Final   Coronavirus 229E NOT DETECTED NOT DETECTED Final    Comment: (NOTE) The Coronavirus on the Respiratory Panel, DOES NOT test for the novel  Coronavirus (2019 nCoV)    Coronavirus HKU1 NOT DETECTED NOT DETECTED Final   Coronavirus NL63 NOT DETECTED NOT DETECTED Final   Coronavirus OC43 NOT DETECTED NOT DETECTED Final   Metapneumovirus NOT DETECTED NOT DETECTED Final   Rhinovirus / Enterovirus NOT DETECTED NOT DETECTED Final   Influenza A NOT DETECTED NOT DETECTED Final   Influenza B NOT DETECTED NOT DETECTED Final   Parainfluenza Virus 1 NOT DETECTED NOT DETECTED Final   Parainfluenza Virus 2 NOT DETECTED NOT DETECTED Final   Parainfluenza Virus 3 NOT DETECTED NOT DETECTED Final   Parainfluenza Virus 4 NOT DETECTED NOT DETECTED Final   Respiratory Syncytial Virus NOT DETECTED NOT DETECTED Final   Bordetella pertussis NOT DETECTED NOT DETECTED Final   Bordetella Parapertussis NOT DETECTED NOT DETECTED Final   Chlamydophila pneumoniae NOT DETECTED NOT DETECTED Final   Mycoplasma pneumoniae NOT DETECTED NOT DETECTED Final    Comment: Performed at [facility], [address], [place], [place] [address]  MRSA Next Gen by PCR, Nasal     Status: None   Collection Time: 04/12/24  6:10 PM   Specimen: Nasal Mucosa; Nasal Swab  Result Value Ref Range Status   MRSA by PCR Next Gen NOT DETECTED NOT DETECTED Final    Comment: (NOTE) The GeneXpert MRSA Assay (FDA approved for NASAL specimens only), is one component of a comprehensive MRSA colonization surveillance program. It is not intended to diagnose MRSA infection nor to guide or monitor treatment for MRSA infections. Test performance is not FDA approved in patients less than 84 years old. Performed at [facility], [address] [place], [place] [address]     Today   Subjective    Gregory Pearson today has no bleeding concerns Unable to wean off oxygen - Dyspnea on exertion improved   Patient has been seen and examined prior to discharge   Objective   Blood pressure 113/75, pulse 68, temperature 97.8 F (36.6 C), temperature source Oral, resp. rate 18, height 6' (1.829 m), weight  97.5 kg, SpO2 95%.   Intake/Output Summary (Last 24 hours) at 04/16/2024 1419 Last data filed at 04/16/2024 1329 Gross per 24 hour  Intake 480 ml  Output 1700 ml  Net -1220 ml    Exam Gen:- Awake Alert, no acute distress  HEENT:- [place].AT, No sclera icterus Neck-Supple Neck,No JVD,.  Lungs-improved air movement, no wheezing  CV- S1, S2 normal, regular, 4/6 systolic murmur Abd-  +ve B.Sounds, Abd Soft, No tenderness,    Extremity/Skin:-Improved edema,   good pulses Psych-affect is appropriate, oriented x3 Neuro-no new focal deficits, no tremors    Data Review   CBC w Diff:  Lab Results  Component Value Date   WBC 8.1 04/16/2024   HGB 12.3 (L) 04/16/2024   HGB 11.3 (L) 12/24/2023   HCT 37.9 (L) 04/16/2024   HCT 35.9 (L) 12/24/2023   PLT 207 04/16/2024   PLT 155 12/24/2023   LYMPHOPCT 8 04/12/2024   MONOPCT 9 04/12/2024   EOSPCT 2 04/12/2024   BASOPCT 0 04/12/2024   CMP:  Lab Results  Component Value Date    NA 130 (L) 04/16/2024   NA 141 12/24/2023   K 3.9 04/16/2024   CL 92 (L) 04/16/2024   CO2 29 04/16/2024   BUN 25 (H) 04/16/2024   BUN 24 12/24/2023   CREATININE 0.71 04/16/2024   PROT 6.8 04/12/2024   PROT 5.9 (L) 12/24/2023   ALBUMIN 3.4 (L) 04/12/2024   ALBUMIN 4.1 12/24/2023   BILITOT 0.8 04/12/2024   BILITOT 0.2 12/24/2023   ALKPHOS 95 04/12/2024   AST 28 04/12/2024   ALT 29 04/12/2024  .  Total Discharge time is about 33 minutes  Rendall Carwin M.D on 04/16/2024 at 2:19 PM  Go to [email or web address] -  for contact info  [facility] - Office  [phone]

## 2024-04-16 NOTE — Progress Notes (Signed)
 Pt alert and oriented x 4 at the beginning of shift. Now he is now confused and doesn't know where he is and what he is doing. Pt re-oriented and told to sleep.

## 2024-04-16 NOTE — Progress Notes (Signed)
 Progress Note  Patient Name: Gregory Pearson Date of Encounter: 04/16/2024  Primary Cardiologist: None  Subjective   SOB significantly improved since admission.  He has been walking in the hallway at his own pace with no symptoms of SOB, according to him.  Labs look good.  Inpatient Medications    Scheduled Meds:  aspirin   81 mg Oral Daily   Chlorhexidine  Gluconate Cloth  6 each Topical Q0600   enoxaparin  (LOVENOX ) injection  40 mg Subcutaneous Q24H   furosemide   40 mg Intravenous Q12H   metoprolol  tartrate  12.5 mg Oral BID   pantoprazole   40 mg Oral Daily   polyethylene glycol  17 g Oral BID   sertraline   50 mg Oral Daily   Continuous Infusions:  PRN Meds: acetaminophen  **OR** acetaminophen , lidocaine , magnesium  hydroxide, ondansetron  **OR** ondansetron  (ZOFRAN ) IV, mouth rinse, traZODone , trolamine salicylate   Vital Signs    Vitals:   04/15/24 1335 04/15/24 1802 04/15/24 2124 04/16/24 0300  BP: (!) 107/90 105/83 103/76 113/75  Pulse: (!) 57 (!) 58 68 68  Resp: 20 20 18 18   Temp: 98.6 F (37 C) 98.4 F (36.9 C) 98.6 F (37 C) 97.8 F (36.6 C)  TempSrc:  Oral Oral Oral  SpO2: 92% 97% 94% 95%  Weight:    97.5 kg  Height:        Intake/Output Summary (Last 24 hours) at 04/16/2024 1040 Last data filed at 04/16/2024 0933 Gross per 24 hour  Intake 240 ml  Output 2450 ml  Net -2210 ml   Filed Weights   04/14/24 0956 04/15/24 0500 04/16/24 0300  Weight: 97.9 kg 98.1 kg 97.5 kg    Telemetry     Personally reviewed, frequent PVCs, NSR.  ECG    Not performed today.  Physical Exam   GEN: No acute distress.   Neck:  JVD Cardiac: RRR, ESM with absent S2 Respiratory: Nonlabored. Clear to auscultation bilaterally. GI: Soft, nontender, bowel sounds present. MS: No edema; No deformity. Neuro:  Nonfocal. Psych: Alert and oriented x 3. Normal affect.  Labs    Chemistry Recent Labs  Lab 04/12/24 1417 04/13/24 0527 04/14/24 0431 04/15/24 0220  04/16/24 0438  NA 136   < > 137 134* 130*  K 3.5   < > 3.7 3.9 3.9  CL 94*   < > 96* 94* 92*  CO2 29   < > 30 30 29   GLUCOSE 101*   < > 120* 118* 105*  BUN 23   < > 21 23 25*  CREATININE 0.75   < > 0.67 0.66 0.71  CALCIUM  8.7*   < > 8.2* 8.1* 8.0*  PROT 6.8  --   --   --   --   ALBUMIN 3.4*  --   --   --   --   AST 28  --   --   --   --   ALT 29  --   --   --   --   ALKPHOS 95  --   --   --   --   BILITOT 0.8  --   --   --   --   GFRNONAA >60   < > >60 >60 >60  ANIONGAP 13   < > 11 10 9    < > = values in this interval not displayed.     Hematology Recent Labs  Lab 04/12/24 1417 04/13/24 0527  WBC 11.1* 10.3  RBC 4.65 4.22  HGB 14.5  13.3  HCT 44.8 40.8  MCV 96.3 96.7  MCH 31.2 31.5  MCHC 32.4 32.6  RDW 14.2 14.3  PLT 179 173    Cardiac EnzymesNo results for input(s): TROPONINIHS in the last 720 hours.  BNP Recent Labs  Lab 04/12/24 1417  BNP 790.0*     DDimerNo results for input(s): DDIMER in the last 168 hours.   Assessment & Plan   Acute on chronic diastolic heart failure exacerbation Severe aortic valve stenosis - Presented with orthopnea, DOE, bilateral leg swelling x 1 month that has worsened in the last 3 days prior to presentation. BNP 790.  Chest x-ray showed pulmonary vascular congestion.  Currently on IV Lasix  40 mg twice daily, made 2.4 L urine output in last 24 hours with net -2.2 L.  Switch IV Lasix  to p.o. torsemide  20 mg once daily.  He is walking in the hallway at his own pace with no symptoms of SOB.  He probably will need to go home on home oxygen due to oxygen desaturations with ambulation.  He does have wheezing bilaterally unclear if this is cardiac or pulmonary.  Will see him back in the cardiology clinic in 1 to 2 weeks to reassess. - Echocardiogram this admission showed severe aortic valve stenosis (moderate AS in September 2024).valve area 0.85 cm, mean PG 40.7 mmHg, V-max 3.94 m/s, DVI 0.23 now.  Physical exam showed findings  consistent with severe AS as well. - He is reasonably active at his age, goes out for dancing etc. and not sedentary. - Ideally, he will benefit from TAVR due to decent functional status, and hence LHC/RHC, CT cardiac TAVR protocol. - However per chart review, he had history of UGIB from gastric ulcer disease secondary to NSAID use, underwent EGD in March and July 2025, GI recommended to stay away from NSAIDs indefinitely.  Continue aspirin  81 mg once daily, CBC pending today. He was supposed to be on pantoprazole  40 mg once daily per GI recommendations, not on it at home.  Started him on p.o. pantoprazole  40 mg once daily this hospital admission.  If he does not bleed or/and keeps his hemoglobin stable for the next 1 month, he likely will be a candidate for TAVR.  If not, palliative care will need to be involved.  Will follow-up on this in the outpatient setting.  CBC in 1 month, general cardiology follow-up in 1-2 weeks and structural heart clinic in 6 weeks.  Patient and family agreeable to this plan. - Patient is currently DNR however he did agree to reverse his CODE STATUS to full code for the invasive cardiac procedures.   Frequent PVCs/couplets/NSVT - Likely secondary to severe AS, acute on chronic diastolic heart failure - Started metoprolol  tartrate 12.5 mg twice daily, PVCs improved. - Echocardiogram showed normal LVEF. - 2-week event monitor upon discharge.   [facility] will sign off.   Medication Recommendations: Continue current medications Other recommendations (labs, testing, etc): 2-week event monitor, NL; CBC in one month Follow up as an outpatient:  general cardiology follow-up in 1 to 2 weeks, structural heart referral   Signed, Willey Due P Olyver Hawes, MD  04/16/2024, 10:40 AM

## 2024-04-16 NOTE — Progress Notes (Signed)
No legal guardian 

## 2024-04-16 NOTE — Telephone Encounter (Signed)
 Per Dr. Alissa request, please arrange 2 week non live Zio monitor for PVCs/NSVT, and also place order for patient to have CBC within a few days before he sees [place] on 9/18. Thank you!

## 2024-04-16 NOTE — Discharge Instructions (Signed)
 1)you need oxygen at home at 2 L via nasal cannula continuously while awake and while asleep--- smoking or having open fires around oxygen can cause fire, significant injury and death  2)Very Low-salt diet advised---Less than 2 gm of Sodium per day advised----ok to use Mrs DASH salt substitute instead of Salt 3)Weigh yourself daily, call if you gain more than 3 pounds in 1 day or more than 5 pounds in 1 week as your diuretic medications may need to be adjusted 4)Limit your Fluid  intake to No more than 60 ounces (1.8 Liters) per day 5)Avoid ibuprofen /Advil /Aleve/Motrin Josefine Powders/Naproxen/BC powders/Meloxicam /Diclofenac /Indomethacin and other Nonsteroidal anti-inflammatory medications as these will make you more likely to bleed and can cause stomach ulcers, can also cause Kidney problems.  6) outpatient follow-up with structural heart team for evaluation of possible aortic valve repair advised 7) please consider referral to palliative care medicine if you are deemed not to be a good candidate for aortic valve repair

## 2024-04-16 NOTE — Progress Notes (Signed)
 Follow-up requested by Dr. Mallipeddi. Patient already scheduled for TOC HF f/u 8/27 and appt with [place] [facility] [name] 9/18. Msg sent to structural team for referral. Also sent request to office to help arrange outpatient CBC (before seeing [place] on 9/18) as well as non-live Zio per Dr. Mallipeddi. I outlined instructions on AVS regarding CBC and non-live Zio to be mailed to his house. Someone else has already added his follow-up information to AVS.

## 2024-04-16 NOTE — Telephone Encounter (Signed)
 Dr. Mallipeddi requested referral to structural heart team. Upon opening phone note appears referral was already placed by team yesterday but will route to structural team as FYI to schedule and call patient with appt. Thank you!

## 2024-04-16 NOTE — Plan of Care (Signed)

## 2024-04-16 NOTE — Care Management Important Message (Signed)
 Important Message  Patient Details  Name: DANH BAYUS MRN: [id number] Date of Birth: 04/21/1928   Important Message Given:  Yes - Medicare IM     Lazar Tierce L Rudean Icenhour 04/16/2024, 12:02 PM

## 2024-04-16 NOTE — Progress Notes (Addendum)
 SATURATION QUALIFICATIONS: (This note is used to comply with regulatory documentation for home oxygen)   Patient Saturations on Room Air at Rest =91%   Patient Saturations on Room Air while Ambulating =87%   Patient Saturations on 2 Liters of oxygen while Ambulating = 95%   Please briefly explain why patient needs home oxygen: Desats on room air  Dx--congestive heart failure  Rendall Carwin, MD

## 2024-04-16 NOTE — Telephone Encounter (Signed)
 CBC order placed for [facility], Zio monitor to be mailed to patient home.  In spoke with son who is with the patient in the hospital now and he confirmed the address for monitor. They plan on going to [place] for cbc before follow up [facility].

## 2024-04-17 ENCOUNTER — Encounter: Payer: Self-pay | Admitting: Family Medicine

## 2024-04-18 LAB — CULTURE, BLOOD (ROUTINE X 2)
Culture: NO GROWTH
Culture: NO GROWTH
Special Requests: ADEQUATE
Special Requests: ADEQUATE

## 2024-04-19 ENCOUNTER — Telehealth: Payer: Self-pay | Admitting: *Deleted

## 2024-04-19 NOTE — Transitions of Care (Post Inpatient/ED Visit) (Signed)
   04/19/2024  Name: Gregory Pearson MRN: [id number] DOB: 1927/11/19  Today's TOC FU Call Status: Today's TOC FU Call Status:: Unsuccessful Call (1st Attempt) Unsuccessful Call (1st Attempt) Date: 04/19/24  Attempted to reach the patient regarding the most recent Inpatient/ED visit.  Follow Up Plan: Additional outreach attempts will be made  [name] [facility], BSN RN Care Manager/ Transition of Care [place]/ [facility] [phone]

## 2024-04-20 ENCOUNTER — Telehealth: Payer: Self-pay | Admitting: *Deleted

## 2024-04-20 ENCOUNTER — Telehealth (HOSPITAL_COMMUNITY): Payer: Self-pay

## 2024-04-20 NOTE — Telephone Encounter (Signed)
 Called to confirm/remind patient of their appointment at the [facility] on 04/21/24 9:00.   Appointment:   [] Confirmed  [x] Left mess   [] No answer/No voice mail  [] VM Full/unable to leave message  [] Phone not in service  Patient reminded to bring all medications and/or complete list.  Confirmed patient has transportation. Gave directions, instructed to utilize valet parking.

## 2024-04-20 NOTE — Progress Notes (Signed)
 HEART & VASCULAR TRANSITION OF CARE CONSULT NOTE     Referring Physician: Dr. Stacia PCP: Dettinger, Fonda LABOR, MD   Chief Complaint: Chronic diastolic heart failure  HPI: Referred to clinic by Dr. Mallipeddi for heart failure consultation.   Demontae Antunes Jeansonne is a 89 y.o. male with chronic diastolic heart failure, UGIB 2/2 peptic ulcer disease, mod AV stenosis and prostate cancer s/p prostatectomy.   Admitted earlier this month with ?PNA, severe AV stenosis and A/C HFpEF. Diuresed well with IV lasix , transitioned to PO Torsemide . Sent home with O2. Plan for OP TAVR workup though hx of GIB may hinder A/C. May need to consider a more palliative approach.   Today he presents for AHF [facility] clinic visit with her son. Overall feeling good. Denies palpitations, CP, dizziness, edema, or PND/Orthopnea. Denies SOB now that he has oxygen. Appetite is very good. No fever or chills. Weight at home 205-207 pounds. Taking all medications. Denies ETOH, tobacco or drug use. Prior to hospitalization he was very active and able to go to the grocery store by himself. He is currently limited by having to carry around his oxygen tank so his son helps with that. Plan to ask ordering provider for portable oxygen concentrator.   Cardiac Testing  Echo 8/25: EF 60-65%, RV normal, trivial MR, sever mitral annular calcification, severe calcification of aortic valve. AVA 0.85 cm, mean PG 40.7 mmHg, V-max 3.94 m/s, DVI 0.23  Past Medical History:  Diagnosis Date   Asbestos exposure    Blood transfusion without reported diagnosis    after ruptured appendix 1963   Diverticulosis    Gastric ulcer 06/2019   GERD (gastroesophageal reflux disease)    Heart murmur    History of stomach ulcers 1995   H.pylori s/p treatment   History of stomach ulcers    Hyperlipidemia    Muscle strain of right gluteal region 01/24/2014   Prostate cancer (HCC) 2002   prostatectomy   Traumatic hematoma of buttock 01/24/2014     Current Outpatient Medications  Medication Sig Dispense Refill   acetaminophen  (TYLENOL ) 500 MG tablet Take 1 tablet (500 mg total) by mouth every 6 (six) hours as needed. 30 tablet 0   ascorbic acid (VITAMIN C) 500 MG tablet Take 500 mg by mouth daily.     aspirin  81 MG chewable tablet Chew 1 tablet (81 mg total) by mouth daily with breakfast. 30 tablet 5   ferrous sulfate  325 (65 FE) MG EC tablet Take 65 mg by mouth as needed.     fluticasone  (FLONASE ) 50 MCG/ACT nasal spray Place 1 spray into both nostrils 2 (two) times daily as needed for allergies or rhinitis. 16 g 6   magnesium  (MAGTAB) 84 MG ( ) TBCR SR tablet Take 84 mg by mouth daily.     metoprolol  tartrate (LOPRESSOR ) 25 MG tablet Take 0.5 tablets (12.5 mg total) by mouth 2 (two) times daily. 30 tablet 5   Multiple Vitamin (MULTIVITAMIN) tablet Take 1 tablet by mouth daily.     pantoprazole  (PROTONIX ) 40 MG tablet Take 1 tablet (40 mg total) by mouth daily. 30 tablet 5   Riboflavin (VITAMIN B-2 PO) Take 1 tablet by mouth daily.     sertraline  (ZOLOFT ) 100 MG tablet Take 1 tablet (100 mg total) by mouth daily. 90 tablet 1   torsemide  (DEMADEX ) 20 MG tablet Take 1 tablet (20 mg total) by mouth daily. 30 tablet 5   traMADol (ULTRAM) 50 MG tablet Take 50 mg by  mouth every 6 (six) hours as needed for moderate pain (pain score 4-6).     traZODone  (DESYREL ) 100 MG tablet Take 1 tablet (100 mg total) by mouth at bedtime as needed. for sleep 90 tablet 3   vitamin B-12 (CYANOCOBALAMIN) 100 MCG tablet Take 100 mcg by mouth daily.     No current facility-administered medications for this encounter.    Allergies  Allergen Reactions   Nsaids Other (See Comments)    GI bleed, PUD    Social History   Socioeconomic History   Marital status: Widowed    Spouse name: Not on file   Number of children: 3   Years of education: Not on file   Highest education level: 12th grade  Occupational History   Occupation: retired Psychiatric nurse power   Tobacco Use   Smoking status: Never   Smokeless tobacco: Never  Vaping Use   Vaping status: Never Used  Substance and Sexual Activity   Alcohol use: Not Currently    Comment: last drank about 15 years ago   Drug use: Never   Sexual activity: Not on file  Other Topics Concern   Not on file  Social History Narrative   3 children.   Widowed since 01/16/2021. Married x 61 years.    Social Drivers of Corporate investment banker Strain: Low Risk  (04/15/2024)   Overall Financial Resource Strain (CARDIA)    Difficulty of Paying Living Expenses: Not hard at all  Food Insecurity: No Food Insecurity (04/20/2024)   Hunger Vital Sign    Worried About Running Out of Food in the Last Year: Never true    Ran Out of Food in the Last Year: Never true  Transportation Needs: No Transportation Needs (04/20/2024)   PRAPARE - Administrator, Civil Service (Medical): No    Lack of Transportation (Non-Medical): No  Physical Activity: Inactive (12/23/2023)   Exercise Vital Sign    Days of Exercise per Week: 0 days    Minutes of Exercise per Session: 20 min  Stress: No Stress Concern Present (12/23/2023)   [employer] of Occupational Health - Occupational Stress Questionnaire    Feeling of Stress : Not at all  Social Connections: Moderately Integrated (04/12/2024)   Social Connection and Isolation Panel    Frequency of Communication with Friends and Family: More than three times a week    Frequency of Social Gatherings with Friends and Family: More than three times a week    Attends Religious Services: More than 4 times per year    Active Member of [employer] or Organizations: Yes    Attends Banker Meetings: More than 4 times per year    Marital Status: Widowed  Intimate Partner Violence: Not At Risk (04/20/2024)   Humiliation, Afraid, Rape, and Kick questionnaire    Fear of Current or Ex-Partner: No    Emotionally Abused: No    Physically Abused: No    Sexually Abused: No    Family History  Problem Relation Age of Onset   Prostate cancer Brother    Prostate cancer Brother    Suicidality Brother    Prostate cancer Brother    Stomach cancer Neg Hx    Colon cancer Neg Hx    Vitals:   04/21/24 0846  BP: 108/68  Pulse: 70  SpO2: 97%  Weight: 96.1 kg (211 lb 12.8 oz)  Height: 6' (1.829 m)    PHYSICAL EXAM: General:  elderly appearing.  On 2L [place]. Arrived  in motorized scooter.  Neck: JVD ~8 cm.  Cor: Regular rate & rhythm. 3/6 RUSB murmur, 2/6 MR. Lungs: clear, coarse bases Extremities: +1 BLE edema  Neuro: alert & oriented x 3. Affect pleasant.   ASSESSMENT & PLAN: Chronic diastolic heart failure - Echo 1/74: EF 60-65%, RV normal, trivial MR, sever mitral annular calcification, severe calcification of aortic valve. AVA 0.85 cm, mean PG 40.7 mmHg, V-max 3.94 m/s, DVI 0.23 NYHA IIIa GDMT  Diuretic- Continue Torsemide  20 mg daily. Volume mildly elevated, starting SGLT2i today. Instructed to take extra 20 mg Torsemide  if needed for weight gain >3lbs overnight or 5lbs in 1 week.  BB- Continue metoprolol  tartrate 12.5 mg BID Ace/ARB/ARNI MRA SGLT2i - Start Farxiga  10 mg daily. Labs today (A1c, BMET, BNP). Repeat labs in 1 week.  - Not a candidate for advanced therapies (heart transplant/LVAD) with advanced age.   Severe aortic valve stenosis - Echo 8/25: severe aortic valve stenosis (moderate AS in September 2024).valve area 0.85 cm, mean PG 40.7 mmHg, V-max 3.94 m/s, DVI 0.23 now.  - Plan for TAVR workup though he has a history of GIB which would complicate A/C post procedure, started on PPI. Structural heart team to eval, if deemed not a candidate would benefit from palliative approach.  - Denies abnormal bleeding.   PVCs/NSVT - Zio per gen cards - Continue toprol  tartrate 12.5 mg BID   Referred to HFSW (PCP, Medications, Transportation, ETOH Abuse, Drug Abuse, Insurance, Financial ): No Refer to Pharmacy:  No Refer to Home Health:  No Refer  to Advanced Heart Failure Clinic: No  Refer to General Cardiology: Yes, has f/u with gen cards next month.   Follow up as scheduled next month with gen cards.

## 2024-04-20 NOTE — Transitions of Care (Post Inpatient/ED Visit) (Signed)
   04/20/2024  Name: Gregory Pearson MRN: [id number] DOB: August 11, 1928  Today's TOC FU Call Status: Today's TOC FU Call Status:: Unsuccessful Call (2nd Attempt) Unsuccessful Call (2nd Attempt) Date: 04/20/24  Attempted to reach the patient regarding the most recent Inpatient/ED visit.  Follow Up Plan: Additional outreach attempts will be made to reach the patient to complete the Transitions of Care (Post Inpatient/ED visit) call.   [name] [facility], BSN RN Care Manager/ Transition of Care [place]/ [facility] [phone]

## 2024-04-20 NOTE — Transitions of Care (Post Inpatient/ED Visit) (Signed)
 04/20/2024  Name: Gregory Pearson MRN: [id number] DOB: 1927-12-19  Today's TOC FU Call Status: Today's TOC FU Call Status:: Successful TOC FU Call Completed TOC FU Call Complete Date: 04/20/24 Patient's Name and Date of Birth confirmed.  Transition Care Management Follow-up Telephone Call Date of Discharge: 04/16/24 [facility] Type of Discharge: Inpatient Admission Primary Inpatient Discharge Diagnosis:: Acute hypoxic respiratory failure How have you been since you were released from the hospital?:  (appetite good, no issues with bowel/ bladder, ambulating with walker) Any questions or concerns?: No  Items Reviewed: Did you receive and understand the discharge instructions provided?: No Medications obtained,verified, and reconciled?: Yes (Medications Reviewed) Any new allergies since your discharge?: No Dietary orders reviewed?: Yes Type of Diet Ordered:: heart healthy    low sodium Do you have support at home?: Yes People in Home [RPT]: alone (son lives across the street) Name of Support/Comfort Primary Source: [place]    son Reviewed signs/ symptoms of infection Reviewed oxygen safety Reviewed HF action plan  Medications Reviewed Today: Medications Reviewed Today     Reviewed by [name] LABOR, RN (Registered Nurse) on 04/20/24 at 1452  Med List Status: <None>   Medication Order Taking? Sig Documenting Provider Last Dose Status Informant  acetaminophen  (TYLENOL ) 500 MG tablet [id number] Yes Take 1 tablet (500 mg total) by mouth every 6 (six) hours as needed. [name] HERO, NP  Active Self, Pharmacy Records  ascorbic acid (VITAMIN C) 500 MG tablet [id number] Yes Take 500 mg by mouth daily. [provider]  Active Self, Pharmacy Records  aspirin  81 MG chewable tablet [id number] Yes Chew 1 tablet (81 mg total) by mouth daily with breakfast. [name], MD  Active   fluticasone  (FLONASE ) 50 MCG/ACT nasal spray [id number] Yes Place 1 spray  into both nostrils 2 (two) times daily as needed for allergies or rhinitis. Dettinger, Fonda LABOR, MD  Active Self, Pharmacy Records  magnesium  (MAGTAB) 84 MG ( ) TBCR SR tablet [id number] Yes Take 84 mg by mouth daily. [provider]  Active Self, Pharmacy Records  metoprolol  tartrate (LOPRESSOR ) 25 MG tablet [id number] Yes Take 0.5 tablets (12.5 mg total) by mouth 2 (two) times daily. [name], MD  Active   Multiple Vitamin (MULTIVITAMIN) tablet [id number] Yes Take 1 tablet by mouth daily. [provider]  Active Self, Pharmacy Records  pantoprazole  (PROTONIX ) 40 MG tablet [id number] Yes Take 1 tablet (40 mg total) by mouth daily. [name], MD  Active   Riboflavin (VITAMIN B-2 PO) [id number] Yes Take 1 tablet by mouth daily. [provider]  Active Self, Pharmacy Records  sertraline  (ZOLOFT ) 100 MG tablet [id number] Yes Take 1 tablet (100 mg total) by mouth daily. Dettinger, Fonda LABOR, MD  Active Self, Pharmacy Records  torsemide  (DEMADEX ) 20 MG tablet [id number] Yes Take 1 tablet (20 mg total) by mouth daily. [name], MD  Active   traZODone  (DESYREL ) 100 MG tablet [id number] Yes Take 1 tablet (100 mg total) by mouth at bedtime as needed. for sleep Dettinger, Fonda LABOR, MD  Active Self, Pharmacy Records  vitamin B-12 (CYANOCOBALAMIN) 100 MCG tablet [id number] Yes Take 100 mcg by mouth daily. [provider]  Active Self, Pharmacy Records            Home Care and Equipment/Supplies: Were Home Health Services Ordered?: No Any new equipment or medical supplies ordered?: No  Functional Questionnaire: Do you need assistance with bathing/showering or dressing?: No Do you need assistance with meal preparation?:  No Do you need assistance with eating?: No Do you have difficulty maintaining continence: No Do you need assistance with getting out of bed/getting out of a chair/moving?: Yes (walker) Do you have difficulty managing or taking your  medications?: No  Follow up appointments reviewed: PCP Follow-up appointment confirmed?: Yes Date of PCP follow-up appointment?: 05/20/24 Follow-up Provider: Dr. Maryanne  @ 1255 pm,    pt declined sooner appointment, only wants to see Dr. Maryanne Specialist [facility] Follow-up appointment confirmed?: Yes Date of Specialist follow-up appointment?: 04/21/24 Follow-Up Specialty Provider:: HF clinic  @ 9 am Do you need transportation to your follow-up appointment?: No Do you understand care options if your condition(s) worsen?: Yes-patient verbalized understanding  SDOH Interventions Today    Flowsheet Row Most Recent Value  SDOH Interventions   Food Insecurity Interventions Intervention Not Indicated  Housing Interventions Intervention Not Indicated  Transportation Interventions Intervention Not Indicated  Utilities Interventions Intervention Not Indicated    [name] [facility], BSN RN Care Manager/ Transition of Care [place]/ [facility] Population Health [phone]

## 2024-04-21 ENCOUNTER — Other Ambulatory Visit (HOSPITAL_COMMUNITY): Payer: Self-pay

## 2024-04-21 ENCOUNTER — Encounter (HOSPITAL_COMMUNITY): Payer: Self-pay

## 2024-04-21 ENCOUNTER — Telehealth (HOSPITAL_COMMUNITY): Payer: Self-pay

## 2024-04-21 ENCOUNTER — Encounter (HOSPITAL_COMMUNITY): Payer: Self-pay | Admitting: Internal Medicine

## 2024-04-21 ENCOUNTER — Ambulatory Visit (HOSPITAL_COMMUNITY)
Admit: 2024-04-21 | Discharge: 2024-04-21 | Disposition: A | Discharge status: Home / Self Care | Source: Ambulatory Visit | Attending: Internal Medicine | Admitting: Internal Medicine

## 2024-04-21 ENCOUNTER — Ambulatory Visit (HOSPITAL_COMMUNITY): Payer: Self-pay | Admitting: Internal Medicine

## 2024-04-21 VITALS — BP 108/68 | HR 70 | Ht 72.0 in | Wt 211.8 lb

## 2024-04-21 DIAGNOSIS — I5032 Chronic diastolic (congestive) heart failure: Secondary | ICD-10-CM | POA: Diagnosis present

## 2024-04-21 DIAGNOSIS — I350 Nonrheumatic aortic (valve) stenosis: Secondary | ICD-10-CM | POA: Insufficient documentation

## 2024-04-21 DIAGNOSIS — Z79899 Other long term (current) drug therapy: Secondary | ICD-10-CM | POA: Diagnosis not present

## 2024-04-21 DIAGNOSIS — I4729 Other ventricular tachycardia: Secondary | ICD-10-CM | POA: Diagnosis not present

## 2024-04-21 DIAGNOSIS — I493 Ventricular premature depolarization: Secondary | ICD-10-CM | POA: Diagnosis not present

## 2024-04-21 LAB — BASIC METABOLIC PANEL WITH GFR
Anion gap: 8 (ref 5–15)
BUN: 21 mg/dL (ref 8–23)
CO2: 29 mmol/L (ref 22–32)
Calcium: 8.7 mg/dL — ABNORMAL LOW (ref 8.9–10.3)
Chloride: 99 mmol/L (ref 98–111)
Creatinine, Ser: 0.71 mg/dL (ref 0.61–1.24)
GFR, Estimated: >60 mL/min (ref >60)
Glucose, Bld: 113 mg/dL — ABNORMAL HIGH (ref 70–99)
Potassium: 4.2 mmol/L (ref 3.5–5.1)
Sodium: 136 mmol/L (ref 135–145)

## 2024-04-21 LAB — BRAIN NATRIURETIC PEPTIDE: B Natriuretic Peptide: 543.7 pg/mL — ABNORMAL HIGH (ref 0.0–100.0)

## 2024-04-21 MED ORDER — DAPAGLIFLOZIN PROPANEDIOL 10 MG PO TABS: 10.0000 mg | ORAL_TABLET | Freq: Every day | ORAL | 3 refills | Status: DC

## 2024-04-21 NOTE — Telephone Encounter (Signed)
 Advanced Heart Failure Patient Advocate Encounter  The patient was approved for a [employer] grant that will help cover the cost of Farxiga , Metoprolol .  Total amount awarded, $7,500.  Effective: 03/22/2024 - 03/21/2025.  BIN [id number] PCN [id number] [id number] ID [id number]  Patient provided with approval and processing information while in office. Billing information also added to GAILA Rachel DEL, CPhT Rx Patient Advocate Phone: [phone]

## 2024-04-21 NOTE — Patient Instructions (Signed)
 Medication Changes:  START FARXIGA  10MG  ONCE DAILY   Lab Work:  Labs done today, your results will be available in MyChart, we will contact you for abnormal readings.  PLEASE GO TO [facility] IN 1 WEEK FOR REPEAT LABS--ORDER GIVEN   Follow-Up in: follow up with General Cardiology as scheduled   At the [facility], you and your health needs are our priority. We have a designated team specialized in the treatment of Heart Failure. This Care Team includes your primary Heart Failure Specialized Cardiologist (physician), Advanced Practice Providers (APPs- Physician Assistants and Nurse Practitioners), and Pharmacist who all work together to provide you with the care you need, when you need it.   You may see any of the following providers on your designated Care Team at your next follow up:  Dr. Toribio Fuel Dr. Ezra Shuck Dr. Ria Commander Dr. Odis Brownie Greig Mosses, NP Caffie Shed, [place] [facility] [place], [place] Beckey Coe, NP [place] Lee, NP Tinnie Redman, PharmD   Please be sure to bring in all your medications bottles to every appointment.   Need to Contact Us :  If you have any questions or concerns before your next appointment please send us  a message through [facility] or call our office at [phone].    TO LEAVE A MESSAGE FOR THE NURSE SELECT OPTION 2, PLEASE LEAVE A MESSAGE INCLUDING: YOUR NAME DATE OF BIRTH CALL BACK NUMBER REASON FOR CALL**this is important as we prioritize the call backs  YOU WILL RECEIVE A CALL BACK THE SAME DAY AS LONG AS YOU CALL BEFORE 4:00 PM

## 2024-04-22 LAB — HEMOGLOBIN A1C
Hgb A1c MFr Bld: 5.6 % (ref 4.8–5.6)
Mean Plasma Glucose: 114 mg/dL

## 2024-04-27 ENCOUNTER — Other Ambulatory Visit

## 2024-04-28 ENCOUNTER — Other Ambulatory Visit

## 2024-04-28 LAB — BASIC METABOLIC PANEL WITH GFR
BUN/Creatinine Ratio: 38 — ABNORMAL HIGH (ref 10–24)
BUN: 26 mg/dL (ref 10–36)
CO2: 26 mmol/L (ref 20–29)
Calcium: 9 mg/dL (ref 8.6–10.2)
Chloride: 98 mmol/L (ref 96–106)
Creatinine, Ser: 0.68 mg/dL — ABNORMAL LOW (ref 0.76–1.27)
Glucose: 107 mg/dL — ABNORMAL HIGH (ref 70–99)
Potassium: 4.4 mmol/L (ref 3.5–5.2)
Sodium: 139 mmol/L (ref 134–144)
eGFR: 86 mL/min/1.73 (ref >59)

## 2024-05-11 ENCOUNTER — Ambulatory Visit: Admitting: *Deleted

## 2024-05-11 ENCOUNTER — Other Ambulatory Visit

## 2024-05-11 DIAGNOSIS — R410 Disorientation, unspecified: Secondary | ICD-10-CM

## 2024-05-11 DIAGNOSIS — I5033 Acute on chronic diastolic (congestive) heart failure: Secondary | ICD-10-CM

## 2024-05-11 LAB — CBC
Hematocrit: 36.9 % — ABNORMAL LOW (ref 37.5–51.0)
Hemoglobin: 11.7 g/dL — ABNORMAL LOW (ref 13.0–17.7)
MCH: 30.7 pg (ref 26.6–33.0)
MCHC: 31.7 g/dL (ref 31.5–35.7)
MCV: 97 fL (ref 79–97)
Platelets: 258 x10E3/uL (ref 150–450)
RBC: 3.81 x10E6/uL — ABNORMAL LOW (ref 4.14–5.80)
RDW: 13.2 % (ref 11.6–15.4)
WBC: 10.4 x10E3/uL (ref 3.4–10.8)

## 2024-05-11 NOTE — Progress Notes (Signed)
 Pt was here today for labwork appt. Son wanting his O2 level checked to make sure that he was getting enough on 2L continuously. Last evening pt got in his car and drove off, parked in a ladies driveway whom was unknown to him or her. She called the cops who intern called the son to come pick him up. O2 level on 2L ran between 91-95% while we sat here and talked for about 10 mins. Pt is having no other sxs. He does see his Cardiologist next week and has a HFU w/ his PCP which has been moved up to [facility] 06/16/24 from next Thurs.

## 2024-05-12 NOTE — Consults (Signed)
 ------------------------------------------------------------------------------- Attestation signed by Thom Earla Coy, MD at 05/12/24 2105 Attending Physician Attestation:    I confirm that I interviewed and examined the patient, reviewed all the relevant lab and diagnostic data, and we agreed upon a plan of treatment. I agree with the findings, assessment, and plan as documented by Jodie Medley, NP.  [phone] PMHx severe aortic stenosis, HFpEF, HLD, PVC's, COPD on home O2, GERD, recent GI bleed 10/2023 2/2 gastric ulcer p/w exertional SOB found with PNA. Physical exam showed systolic murmur, bilateral wheezing, warm extremities with bilateral swelling. Labs showed WBC 22, trop neg x3, pro-BNP 2043. CXR showed bilateral opacities. EKG showed sinus rhythm, PVC's, nonspecific ST changes. Recent TTE 04/23/24 showed EF 60-65%, severe aortic stenosis (AVA 0.85, VMax 3.94, MG 40.7). Patient likely has decompensated HF occurring in setting of PNA though he remains well compensated and reports responding well to IV diuresis.  Plan: - Continue IV Lasix  for diuresis, switch back to home PO torsemide  when patient is at baseline O2. If his oxygenation does not improve with diuresis then the hypoxia would more likely be due to pulmonary etiology. In that case recommend repeat echo to assess intracardiac pressures to guide diuresis. Would want to avoid overdiuresis in patient with severe aortic stenosis. - Ok to hold ASA given dark stools. ASA was started by his outpatient cardiologist to see if he would tolerate antithrombotic therapy which he would require after TAVR. He recently had GI bleed due to gastric ulcer in setting of NSAID use in 10/2023. - Continue metoprolol  for PVC/NSVT  - Continue Jardiance for HFpEF - Follow up with outpatient cardiologist Dr. Vishnu Mallipedi and structural heart team at [facility], MD,  MPH -------------------------------------------------------------------------------  Assessment/Plan:  1.  CHF exacerbation - Presented with acute onset of shortness of breath earlier today.  proBNP 2043.  CXR C/W infection or edema - Recent hospitalization for acute on chronic hypoxic respiratory failure/acute on chronic diastolic HF - Prior echo 04/2023 EF 60 to 65%, G1 DD, moderate aortic stenosis not on diuretics - Echo at recent hospitalization on 04/13/2024 EF 50 to 55%, severe aortic stenosis per hospitalist note.  See #2 below - He was recently referred to the structural heart team for evaluation for TAVR for severe aortic stenosis. - Received IV Lasix  in ED.  Hospitalist started IV Lasix  40 mg daily - Continue IV diuresis until euvolemic, then transition to home torsemide  - Strict I's and O's, daily weights, 1.5 L/day fluid restrictions, sodium restrictions, low-sodium diet. - Daily BMP to assess electrolytes and renal function. - Recently discharged on torsemide  20 mg p.o. daily.  May take extra dose as needed for weight gain greater than 3 pounds in 24 hours or 5 pounds in 1 week per heart failure clinic notes - Continue Farxiga  10 mg p.o. daily postdischarge (receiving Jardiance while in hospital) Farxiga  not on hospital formulary - Recently discharged on metoprolol  to tartrate 12.5 mg p.o. twice daily.  Continue inpatient - Sees advanced heart failure clinic via [facility]. Recent office visit April 21, 2024.  See note  - Will need to follow-up with advanced heart failure clinic postdischarge postdischarge. - Sees Dr. Vishnu Mallipedi cardiology at [facility].  Will need to follow-up with her postdischarge - O2 saturation currently 90% on 2 L.  ABG with PaO2 of 59 on arrival with O2 sat 90.8%. - If oxygenation does not improve overnight will order repeat echocardiogram in AM.  2.  Severe aortic valve stenosis. - Recent echo  during recent hospitalization with preserved LVEF but  aortic stenosis worse on echo. - Severe aortic valve stenosis; AVA by VTI 0.85 cm.  Aortic valve mean gradient 40.7 mmHg, aortic valve V-max 3.94 m/s, DVI 0.23 - Patient was referred to structural heart team for evaluation for possible TAVR. - Started on aspirin  81 mg given recent plans for TAVR per hospital note - Hospitalist has ordered repeat echocardiogram - Will need to follow-up with: Structural heart team post discharge.  3.  Chronic hypoxic respiratory failure - Recent hospitalization for acute on chronic hypoxic respiratory failure in setting of diastolic CHF and severe aortic stenosis. - He was discharged home on 2 L of O2 nasal cannula - ABG with pH 7.4, pCO2 45.4, pO2 59, bicarb 28, O2 sat 90.8% - Continue O2 and as needed nebs.   4.  History of GI bleed. - Hemoglobin was stable at recent hospitalization at [facility].   - As mentioned above aspirin  81 mg was started recently given plan for TAVR evaluation. - Takes Protonix  at home. - Hospitalist service stopped ASA on arrival secondary to black stools on arrival  5.  HCAP - Per hospitalist hospital-acquired pneumonia recent admission to [facility] for pneumonia/acute on chronic hypoxic respiratory failure, severe aortic stenosis, acute on chronic hypoxic respiratory failure. - X-ray; opacities consistent with infection or edema. - Received IV Rocephin  and azithromycin  in ED. - Blood cultures were sent. - Hospitalist ordered Zosyn, sputum culture and respiratory pathogen pathology panel. - Hospitalist service managing.   History of Present Illness:  Chief Complaint  Gregory Pearson is evaluated at request of Hospitalist in regards to CHF exacerbation.  Has past medical history of COPD/chronic hypoxic respiratory failure on home oxygen.  HFpEF, osteoarthritis GERD, gastric ulcer, history of stomach ulcers, hyperlipidemia, prostate CA status post prostatectomy, diverticulosis, asbestos exposure.  Severe aortic stenosis, GI  bleed, PVCs/NSVT  Patient presented earlier this morning around 3:30 AM via EMS for complaints of shortness of breath that started when he woke up to go to the bathroom.  He denied any cough or congestion, fever, chills, or chest pain.  Describes symptoms as worse with exertion.  History of COPD on home oxygen.  He was given DuoNebs and Solu-Medrol  by EMS.  Per EMS sats for 70s on his normal 3 L of home oxygen.   ED workup demonstrated significantly elevated WBC with hemoglobin of 12, absolute neutrophils of 19.4.  Chemistry with BUN of 25, creatinine 0.7, calcium  8.4, albumin 2.7.  Troponins were 15 => 17 => 19 respectively.  proBNP was 2043.  ABG with pH 7.4, pCO2 45.4, pO2 59, bicarb 28, O2 sat 90.8%.  Blood cultures were sent.  Viral panel was negative. Initial EKG on arrival sinus rhythm with frequent PVCs and consecutive premature ventricular complexes rate of 97 nonspecific ST abnormality, prolonged QT.  Second EKG sinus rhythm with PACs with aberrant conduction nonspecific ST abnormality rate of 89.  Portable CXR heterogeneous opacities compatible with edema or infection.   He was treated in the emergency department with DuoNebs, IV Lasix  40 mg, Rocephin  2 g IV, azithromycin  500 mg IV.  He was given a second dose of IV Lasix  40 mg followed by morphine  1 mg IV.  He was given IV Protonix .  He was admitted to hospitalist service for the acute epoxy respiratory failure secondary to exacerbation of CHF and likely pneumonia.  Hospital service started IV Lasix , O2, nebulizer as needed, I's and O's, daily weights, echocardiogram.  Hospitalist ordered trending  troponins check TSH and lipids.  Hospitalist mention will need IV ARB and beta-blocker.   He had a recent hospital admission on 04/16/2024 to [facility] secondary to pneumonia, acute on chronic diastolic heart failure, acute hypoxic respiratory failure, severe aortic valve stenosis, frequent PVCs, history of GI bleed   Of note, he was recently  seen at advanced heart failure clinic at [facility] health for diastolic heart failure referred by his primary cardiologist Dr. Mallipeddi for heart failure consultation.  He was continuing his torsemide .  SGLT2 inhibitor was started.  He was advised he could take an extra torsemide  if needed for weight gain of 3 pounds overnight or 5 pounds in 1 week.  He was continuing his metoprolol  to tartrate 12.5 mg p.o. twice daily.  Farxiga  was started at 10 mg.  With plans to repeat A1c, BP med and BMP in 1 week.  Per office note he was not candidate for advanced therapies i.e. heart transplant or LVAD given advanced age.  Plan was for TAVR workup though he has a history of GI bleed which would complicate anticoagulation postprocedure.  Office note stated the structural heart team evaluated and if deemed not a candidate would benefit from palliative approach.  He was continuing his metoprolol  to tartrate 12.5 mg p.o. twice daily for frequent PVCs/NSVT.  Labs during that visit showed glucose of 107, creatinine 0.68, BUN creatinine ratio of 38 otherwise chemistry was normal.  Hemoglobin A1c was 5.6%, BNP was 543.7.    Upon speaking with him today he states he became suddenly short of breath earlier and had some leg swelling as well.  He states he has been taking his medications as directed.  He states that he received the IV Lasix  in the ED his breathing has improved and he has urinating quite a bit.  He has his home compression stockings on but does have some lower extremity and both lower extremities right greater than left.  He is in no acute respiratory distress but does have rales and wheezing bilaterally.  He does have chronic hypoxic respiratory failure and is on oxygen at home.  His son and daughter in law were at the nursing station and discussion took place regarding continuation of his pneumonia and he is being treated with IV antibiotics.  Discussed continuation of IV diuresis as well.  They are unsure of his  compliance with fluid intake.  Upon discussion this with him he states he usually drinks 4 bottles of fluid but admits to drinking coffee and uses MiraLAX  and water  at home and sometimes fixes smoothies but states he has decreased the amount of smoothies he prepares at home.  We discussed his severe aortic stenosis and referral to structural heart team.  He states he has an upcoming appointment with the structural heart team for evaluation for possible TAVR.  Hospitalist has stopped his aspirin  which he was on pending TAVR evaluation due to black stools.  His family states he has been taking Pepto-Bismol frequently.  He states his breathing is better since he received the IV diuretics and nebulizer therapy.  He states the reason EMS brought him here versus [facility] [place] was because both [facility] and [facility] were full.  He appears stable and is in no acute heart acute distress at the moment.  Allergies: Nsaids (non-steroidal anti-inflammatory drug)  Medications:  Current Medications[1]  Medical History: Past Medical History[2]  Surgical History: Past Surgical History[3]  Social History: Tobacco use: Tobacco Use History[4]  Alcohol use:  Social History   Substance and Sexual Activity  Alcohol Use No    Drug use:  Social History   Substance and Sexual Activity  Drug Use No     Family History: The patient's family history is not on file..  Code Status: Full Code  Review of Systems: Review of Systems  Constitutional:  Positive for activity change.  HENT: Negative.    Eyes: Negative.   Respiratory:  Positive for shortness of breath.   Cardiovascular:  Positive for leg swelling.  Endocrine: Negative.   Genitourinary: Negative.   Musculoskeletal: Negative.   Skin: Negative.   Allergic/Immunologic: Negative.   Neurological: Negative.   Hematological: Negative.   Psychiatric/Behavioral: Negative.      Objective:   Patient Vitals for the past 8 hrs:  BP Temp Temp  src Pulse SpO2 Pulse Resp SpO2  05/12/24 1144 109/80 37 C (98.6 F) Oral 80 -- 18 94 %  05/12/24 0804 -- 36.6 C (97.8 F) Oral -- -- -- --  05/12/24 0644 110/96 -- -- 91 90 18 91 %  05/12/24 0511 -- -- -- 99 98 25 90 %  05/12/24 0500 119/79 -- -- 91 91 28 (!) 89 %   I/O this shift: In: 250 [IV Piggyback:250] Out: -   Physical Exam Physical Exam Constitutional:      Appearance: Normal appearance.  HENT:     Head: Normocephalic.  Eyes:     Pupils: Pupils are equal, round, and reactive to light.  Cardiovascular:     Rate and Rhythm: Normal rate and regular rhythm.     Pulses: Normal pulses.     Heart sounds: Murmur heard.  Pulmonary:     Effort: Pulmonary effort is normal.     Breath sounds: Wheezing and rales present.  Abdominal:     Palpations: Abdomen is soft.  Musculoskeletal:        General: Swelling present.     Cervical back: Normal range of motion.     Right lower leg: Edema present.     Left lower leg: Edema present.  Skin:    General: Skin is warm and dry.     Capillary Refill: Capillary refill takes less than 2 seconds.  Neurological:     General: No focal deficit present.     Mental Status: He is alert and oriented to person, place, and time.  Psychiatric:        Mood and Affect: Mood normal.        Behavior: Behavior normal.        Thought Content: Thought content normal.        Judgment: Judgment normal.     Test Results Data Review:   Recent Results (from the past 72 hours)  Rapid Influenza / RSV / COVID PCR   Collection Time: 05/12/24  3:46 AM   Specimen: Nasopharyngeal Swab  Result Value Ref Range   SARS-CoV-2 PCR Negative Negative   Influenza A Negative Negative   Influenza B Negative Negative   RSV Negative Negative  ECG 12 Lead   Collection Time: 05/12/24  3:49 AM  Result Value Ref Range   EKG Systolic BP  mmHg   EKG Diastolic BP  mmHg   EKG Ventricular Rate 97 BPM   EKG Atrial Rate 97 BPM   EKG P-R Interval 144 ms   EKG QRS  Duration 102 ms   EKG Q-T Interval 388 ms   EKG QTC Calculation 492 ms   EKG Calculated P  Axis 40 degrees   EKG Calculated R Axis 38 degrees   EKG Calculated T Axis 73 degrees   QTC Fredericia 455 ms  Arterial Blood Gas   Collection Time: 05/12/24  3:54 AM  Result Value Ref Range   pH, Arterial 7.40 7.35 - 7.45   pCO2, Arterial 45.4 35.0 - 48.0 mm[Hg]   pO2, Arterial 59 (L) 83 - 108 mm[Hg]   HCO3 (Bicarbonate), Arterial 28.0 (H) 18.0 - 23.0 mmol/L   Base Excess, Arterial 2.7 -2.0 - 3.0 mmol/L   O2 Sat, Arterial 90.8 (L) 95.0 - 98.0 %   Total Carbon Dioxide, Arterial 29 22 - 29 mmol/L   Carboxyhemoglobin 1.8 0.0 - 4.9 %   ABG Allen Test POS    BG Draw Site Radial, right   Comprehensive Metabolic Panel   Collection Time: 05/12/24  4:14 AM  Result Value Ref Range   Sodium 138 135 - 145 mmol/L   Potassium 4.0 3.5 - 5.0 mmol/L   Chloride 102 98 - 107 mmol/L   CO2 31.9 21.0 - 32.0 mmol/L   Anion Gap 4 3 - 11 mmol/L   BUN 25 (H) 8 - 20 mg/dL   Creatinine 9.29 (L) 9.19 - 1.30 mg/dL   BUN/Creatinine Ratio 36    eGFR CKD-EPI (2021) Male 85 >=60 mL/min/1.3m2   Glucose 148 70 - 179 mg/dL   Calcium  8.4 (L) 8.5 - 10.1 mg/dL   Albumin 2.7 (L) 3.5 - 5.0 g/dL   Total Protein 7.2 6.0 - 8.0 g/dL   Total Bilirubin 0.7 0.3 - 1.2 mg/dL   AST 21 15 - 40 U/L   ALT 22 12 - 78 U/L   Alkaline Phosphatase 113 46 - 116 U/L  Lactate Sepsis   Collection Time: 05/12/24  4:14 AM  Result Value Ref Range   Lactate 1.0 0.5 - 1.9 mmol/L  Pro-BNP   Collection Time: 05/12/24  4:14 AM  Result Value Ref Range   PRO-BNP 2,043.0 (H) 0.0 - 450.0 pg/mL  hsTroponin I (serial 0-2-6H w/ delta)   Collection Time: 05/12/24  4:14 AM  Result Value Ref Range   hsTroponin I 15 <=53 ng/L  CBC w/ Differential   Collection Time: 05/12/24  4:14 AM  Result Value Ref Range   WBC 22.0 (H) 4.0 - 10.5 10*9/L   RBC 3.98 (L) 4.10 - 5.60 10*12/L   HGB 12.0 (L) 12.5 - 17.0 g/dL   HCT 62.6 63.9 - 49.9 %   MCV 93.7 80.0 -  98.0 fL   MCH 30.2 27.0 - 34.0 pg   MCHC 32.2 32.0 - 36.0 g/dL   RDW 85.0 (H) 88.4 - 85.4 %   MPV 9.8 7.4 - 10.4 fL   Platelet 250 140 - 415 10*9/L  Manual Differential   Collection Time: 05/12/24  4:14 AM  Result Value Ref Range   Neutrophils % 87 %   Lymphocytes % 6 %   Monocytes % 3 %   Eosinophils % 3 %   Bands % 1 %   Absolute Neutrophils 19.4 (H) 1.8 - 7.8 10*9/L   Absolute Lymphocytes 1.3 0.7 - 4.5 10*9/L   Absolute Monocytes 0.7 0.1 - 1.0 10*9/L   Absolute Eosinophils 0.7 (H) 0.0 - 0.4 10*9/L   Absolute Basophils    ECG 12 Lead   Collection Time: 05/12/24  5:45 AM  Result Value Ref Range   EKG Systolic BP  mmHg   EKG Diastolic BP  mmHg   EKG Ventricular Rate 89 BPM  EKG Atrial Rate 89 BPM   EKG P-R Interval 166 ms   EKG QRS Duration 90 ms   EKG Q-T Interval 384 ms   EKG QTC Calculation 467 ms   EKG Calculated P Axis 47 degrees   EKG Calculated R Axis 52 degrees   EKG Calculated T Axis 75 degrees   QTC Fredericia 437 ms  hsTroponin I - 2 Hour   Collection Time: 05/12/24  6:01 AM  Result Value Ref Range   hsTroponin I 17 <=53 ng/L   delta hsTroponin I 2 <=7 ng/L  Hemoglobin and Hematocrit   Collection Time: 05/12/24 10:11 AM  Result Value Ref Range   HGB 11.3 (L) 12.5 - 17.0 g/dL   HCT 66.0 (L) 63.9 - 49.9 %  hsTroponin I - 6 Hour   Collection Time: 05/12/24 10:11 AM  Result Value Ref Range   hsTroponin I 19 <=53 ng/L   delta hsTroponin I 2 <=7 ng/L    Imaging: Radiology studies were personally reviewed    Echocardiogram 04/13/2024 [facility]  1. Left ventricular ejection fraction, by estimation, is 60 to 65%. The left ventricle has normal function. Left ventricular endocardial border not optimally defined to evaluate regional wall motion. Left ventricular diastolic function could not be  evaluated.   2. Right ventricular systolic function is normal. The right ventricular size is normal. Tricuspid regurgitation signal is inadequate for assessing PA  pressure.   3. Left atrial size was severely dilated.   4. The mitral valve is abnormal. Trivial mitral valve regurgitation. No evidence of mitral stenosis. Severe mitral annular calcification.   5. The aortic valve is tricuspid. There is severe calcifcation of the aortic valve. There is severe thickening of the aortic valve. Aortic valve regurgitation is mild. Severe aortic valve stenosis. Aortic valve area, by VTI measures 0.85 cm. Aortic  valve mean gradient measures 40.7 mmHg. Aortic valve Vmax measures 3.94 m/s. DVI is 0.23.   6. Aortic dilatation noted. There is borderline dilatation of the aortic root, measuring 39 mm.    Echocardiogram 05/23/2023 [facility] 1. Left ventricular ejection fraction, by estimation, is 60 to 65%. The left ventricle has normal function. The left ventricle has no regional wall motion abnormalities. There is mild asymmetric left ventricular hypertrophy of the basal and septal  segments. Left ventricular diastolic parameters are consistent with Grade I diastolic dysfunction (impaired relaxation). Elevated left ventricular end-diastolic pressure.   2. Right ventricular systolic function is normal. The right ventricular size is normal.   3. The mitral valve is abnormal. Trivial mitral valve regurgitation. No evidence of mitral stenosis. Moderate mitral annular calcification.   4. The aortic valve is tricuspid. There is severe calcifcation of the aortic valve. There is moderate thickening of the aortic valve. Aortic valve regurgitation is mild. Moderate aortic valve stenosis. Aortic valve area, by VTI measures 0.93 cm. Aortic   valve mean gradient measures 25.0 mmHg. Aortic valve Vmax measures 3.37 m/s. DVI is 0.28.         [1] Current Facility-Administered Medications  Medication Dose Route Frequency Provider Last Rate Last Admin  . acetaminophen  (TYLENOL ) tablet 650 mg  650 mg Oral Q4H PRN Hugelmeyer, Alexis, DO      . albuterol 2.5 mg /3 mL (0.083 %)  nebulizer solution 2.5 mg  2.5 mg Nebulization Q6H PRN Hugelmeyer, Alexis, DO      . aluminum-magnesium  hydroxide-simethicone  (MAALOX MAX) 80-80-8 mg/mL oral suspension  30 mL Oral Q4H PRN Hugelmeyer, Alexis, DO      .  bisacodyl (DULCOLAX) EC tablet 10 mg  10 mg Oral Daily PRN Hugelmeyer, Alexis, DO      . empagliflozin (JARDIANCE) tablet 10 mg  10 mg Oral Daily Pace, Hagan Eggleston, FNP      . enoxaparin  (LOVENOX ) syringe 40 mg  40 mg Subcutaneous Q24H Hugelmeyer, Alexis, DO      . ferrous sulfate  tablet 325 mg  325 mg Oral Daily Pace, Hagan Eggleston, FNP      . furosemide  (LASIX ) injection 40 mg  40 mg Intravenous Daily Pace, Hagan Eggleston, FNP   40 mg at 05/12/24 0805  . guaiFENesin (ROBITUSSIN) oral syrup  200 mg Oral Q4H PRN Hugelmeyer, Alexis, DO      . melatonin tablet 3 mg  3 mg Oral Nightly PRN Hugelmeyer, Alexis, DO      . metoPROLOL  tartrate (Lopressor ) tablet 12.5 mg  12.5 mg Oral BID Pace, Hagan Eggleston, FNP      . morphine  4 mg/mL injection 1 mg  1 mg Intravenous Q4H PRN Hugelmeyer, Alexis, DO   1 mg at 05/12/24 0805  . naloxone (NARCAN) injection 0.1 mg  0.1 mg Intravenous Q5 Min PRN Hugelmeyer, Alexis, DO      . ondansetron  (ZOFRAN -ODT) disintegrating tablet 4 mg  4 mg Oral Q8H PRN Hugelmeyer, Alexis, DO       Or  . ondansetron  (ZOFRAN -ODT) disintegrating tablet 8 mg  8 mg Oral Q8H PRN Hugelmeyer, Alexis, DO      . [Provider Hold] pantoprazole  (Protonix ) EC tablet 40 mg  40 mg Oral Daily before breakfast Pace, Hagan Eggleston, FNP      . pantoprazole  (Protonix ) injection 40 mg  40 mg Intravenous BID Pace, Hagan Eggleston, FNP   40 mg at 05/12/24 1046  . piperacillin-tazobactam (ZOSYN) 3.375 g in sodium chloride  0.9 % (NS) 100 mL IVPB-connector bag  3.375 g Intravenous Q8H Hugelmeyer, Alexis, DO      . polyethylene glycol (MIRALAX ) packet 17 g  17 g Oral Daily PRN Hugelmeyer, Alexis, DO      . prochlorperazine (COMPAZINE) injection 2.5 mg  2.5 mg Intravenous Q8H PRN  Hugelmeyer, Alexis, DO       Or  . prochlorperazine (COMPAZINE) injection 5 mg  5 mg Intravenous Q8H PRN Hugelmeyer, Alexis, DO      . sertraline  (ZOLOFT ) tablet 100 mg  100 mg Oral Daily Pace, Hagan Eggleston, FNP      . [Provider Hold] torsemide  (DEMADEX ) tablet 20 mg  20 mg Oral Daily Pace, Hagan Eggleston, FNP      . traMADol DANNY) tablet 50 mg  50 mg Oral Q6H PRN Pace, Hagan Eggleston, FNP      . traZODone  (DESYREL ) tablet 50 mg  50 mg Oral Nightly PRN Pace, Hagan Eggleston, FNP      [2] Past Medical History: Diagnosis Date  . Arthritis   . GERD (gastroesophageal reflux disease)   [3] Past Surgical History: Procedure Laterality Date  . APPENDECTOMY    . CHOLECYSTECTOMY    [4] Social History Tobacco Use  Smoking Status Never  . Passive exposure: Never  Smokeless Tobacco Never

## 2024-05-12 NOTE — H&P (Signed)
 History and Physical [facility] TELEHOSPITALIST [facility] [place]   05/12/24    Patient name: Gregory Pearson DOB April 28, 1928 MRN#: [id number] PCP: Sheffield Rouse F Time: 5:57 AM Primary Care Provider:  Sheffield Rouse FALCON Inpatient primary attending provider: Thersia Roger, D.O. / Adina Para, D.O.  _________________________________________________________________________  Admission HPI  Patient admitted on: 05/12/2024  3:37 AM  Patient admitted by: Thersia Roger, D.O.  CHIEF COMPLAINT:  SOB  Day of admission HPI:  Gregory Pearson  is a 89 y.o. male with a PMH significant for HFpEF OA, GERD, GI bleed, severe aortic stenosis, COPD on 2L at baseline, who presented with SOB, cough and congestion.  Patient lives independently, still drives at night and goes dancing twice weekly.  Has noticed DOE worsening in the past week.  Was discharged from [facility] on 04/16/24 after hospitalization for pneumonia and acute exacerbation of CHF.   Last echo 8/25 - EF 60-65%, moderate aortic stenosis, LV diastolic function could not be evaluated however last echo 9/24 showed grade 1 diastolic dysfunction.  Otherwise there has been no change in status. Patient has been taking medication as prescribed and there has been no recent change in medication or diet. There has been no recent travel or sick contacts.  Patient denies fevers/chills, weakness, dizziness, chest pain,  N/V/C/D, abdominal pain, dysuria/frequency, changes in mental status.  In the ED the patient received Rocephin , Azithro, furosemide , Duoneb.   Medical admission was requested for further workup and management of CHF exacerbation, likely pneumonia.    Patient admitted on Home O2? - Yes, 2L Patient on home anticoagulant? -  No Patient admitted with Chronic home foley catheter? - No Foley catheter placed or replaced by another service prior to admission? - No Central Line Status: None  Mental Status on Admission: The  patient IS Alert and oriented to PERSON The patient IS  Alert And oriented to TIME The patient IS Alert and oriented to LOCATION  Problem List, Assessment & Plan    ASSESSMENT & PLAN (In order of descending acuity)  89 y.o. male with a PMH significant for HFpEF, OA, GERD, GI bleed, severe aortic stenosis, COPD on 3L at baseline now admitted with:  Acute hypoxic respiratory failure 2/2 exacerbation of CHF, likely pneumonia Patient is symptomatic with: SOB, LE edema  PE: Sitting up in bed, resting comfortably on 2L, rales and LE edema present.  Labs: WBC 22K, BNP 2043, trop negative Imaging reveals: CXR - opacities c/w infection or edema - Admit inpatient, tele - IV Lasix   - O2 and nebs PRN - Intake and output - Daily weights - Check echo - Trend trops, check TSH, lipids - Will need ARB and beta blocker - Home meds will need to be reconciled when son returns.   Hospital pneumonia - IV Zosyn to cover for HCAP considering recent hospitalizations.  - Check sputum culture and resp path panel  Admission status: IP, tele IV Fluids: HL Diet/Nutrition: Heart healthy Consults: PT, cardiology DVT Px: Lovenox , SCDs and early ambulation Code Status: DNR/DNI on file, son [name] is POA Disposition Plan: To home in 1-2 days   All the records are reviewed, management plans discussed and questions answered. Patient and / or family expresses understanding and agrees with plan.      ___________________________________________    ADDITIONAL NON-ACUTE FINDINGS, OBSERVATIONS, FAMILY DISCUSSIONS, ETC. (When present):  Gen: Sitting up in bed, NAD HEENT: [place]/AT, EOMI, no scleral icterus, mucous membranes moist Lungs: Respirations unlabored, no retractions or use of accessory  muscles.  No audible cough or wheezing.  Per provider, wheezing, rhonchi and rales are present at bases.    Heart: Per provider, regular rate and rhythm Abdomen: Per provider, no tenderness noted, soft, non-distended, normal  bowel sounds. Extremities: Bilateral LE edema Skin: No jaundice or petechiae.   Neurologic: Alert and oriented x3, speech clear, no facial droop, no tremor, moves all extremities  _______________________________  Temp:  [37.2 C (99 F)] 37.2 C (99 F) Pulse:  [91-100] 99 SpO2 Pulse:  [91-98] 98 Resp:  [20-28] 25 BP: (119-144)/(73-118) 119/79 SpO2:  [88 %-90 %] 90 % Body mass index is 28.73 kg/m. Intake/Output last 3 shifts: No intake/output data recorded.  Consults Requested  IP CONSULT TO HOSPITALIST      An advanced care planning discussion is  had with patient and/or patient's decisions maker (documented separately).  CODE STATUS :  FULL CODE  Given this patient's known comorbid illnesses and condition Present on Admission, plan of care includes acute interventions of IV diuresis, IV abx as noted in the Problem List, Assessment & Plan noted above.  I fully expect, with this information in hand, that this patient will require at least two medically necessary midnights of hospital care prior to a safe discharge.   The patient's hospital stay is complicated by the above clinically significant conditions, as documented in Assessment and Plan, which are present on admission and requiring additional evaluation and treatment or having a significant effect of this patient's care.    __________________________________________________________  No Known Allergies   Past Medical History[1]  Past Surgical History[2]   Family History[3]   Current Medications[4]  REFER TO EPIC FOR FULL LIST OF CURRENT MEDICATIONS ORDERED ON ADMISSION. THESE ORDERS APPEAR ONLY WHEN RELEASED, WHICH MAY HAPPEN AFTER ADMISSION ONCE PATIENT IS TRANSFERRED FROM THE ED.  Allergies  Allergies[5]  Imaging  ECG 12 Lead Result Date: 05/12/2024 Sinus rhythm with frequent and consecutive premature ventricular complexes Nonspecific ST abnormality Prolonged QT Abnormal ECG No previous ECGs available  Confirmed by Cherie Searle ([id number]) on 05/12/2024 4:34:11 AM  XR Chest Portable Result Date: 05/12/2024 Exam:  Portable Chest  History:  Shortness of breath    Technique: Single frontal view of the chest.  Comparison:  None.    Findings:  There is heterogeneous opacity throughout the lungs. Diminished lung volumes. No visible effusion or pneumothorax. Mediastinal contours are unremarkable.        Heterogeneous opacities compatible with edema or infection.      Signed (Electronic Signature): 05/12/2024 4:24 AM Signed By: Emeline KATHEE Milroy, MD   Lab Results   Recent Labs    05/12/24 0414  WBC 22.0*  HGB 12.0*  HCT 37.3  PLT 250   Recent Labs    05/12/24 0414  NA 138  K 4.0  CL 102  CO2 31.9  BUN 25*  CREATININE 0.70*  GLU 148  CALCIUM  8.4*  ALBUMIN 2.7*  PROT 7.2  BILITOT 0.7  AST 21  ALT 22  ALKPHOS 113  LACTATE 1.0   Recent Labs    05/12/24 0414  TROPONINI 15   No results for input(s): WBCUA, NITRITE, LEUKOCYTESUR, BACTERIA, RBCUA, BLOODU, GLUCOSEU, PROTEINUA, KETONESU, KETUR in the last 72 hours. No results for input(s): OPIAU, BENZU, TRICYCLIC, PCPU, AMPHU, COCAU, CANNAU, BARBU, ETOH, ACETAMIN, SALICYLATE in the last 72 hours. No results for input(s): PREGTESTUR, PREGPOC in the last 72 hours. No results for input(s): OCCULTBLD, RAPSCRN, CDIFRPCR, CDIFFNAP1, A1C, CHOL, LDL, HDL, TRIG in the last 72 hours.  Recent Labs    05/12/24 0354  PHART 7.40  PCO2ART 45.4  PO2ART 59*  HCO3ART 28.0*  O2SATART 90.8*  BEART 2.7   Pending Labs     Order Current Status   Blood Culture In process   Blood Culture In process       EKG: NSR at 89 bpm with normal axis, PACs and nonspecific ST-T wave changes.   Home Medications   Prior to Admission medications  Medication Dose, Route, Frequency  acetaminophen  (TYLENOL ) 500 MG tablet 1 tablet, Every 6 hours PRN  azithromycin  (ZITHROMAX  Z-PAK) 250 MG tablet Take  2 tablets (500 mg) on  Day 1,  followed by 1 tablet (250 mg) once daily on Days 2 through 5.  cetirizine (ZYRTEC) 10 MG tablet 10 mg, Oral, Daily (standard)  meloxicam  (MOBIC ) 15 MG tablet 15 mg, Oral  traZODone  (DESYREL ) 50 MG tablet TAKE 1/2 TO 1 (ONE-HALF TO ONE) TABLET BY MOUTH AT BEDTIME AS NEEDED FOR SLEEP   Alexis Hugelmeyer, DO Hospitalist, [facility] 05/12/24, 5:57 AM   I was outside of the hospital.  I spent 15 minutes on the video with the patient for a consultation. Total time was 75 minutes and over 50% of the service was counseling and/or coordination of care within the patient unit. Physical Exam performed with the assistance of bedside nursing staff.       [1] Past Medical History: Diagnosis Date  . Arthritis   . GERD (gastroesophageal reflux disease)   [2] Past Surgical History: Procedure Laterality Date  . APPENDECTOMY    . CHOLECYSTECTOMY    [3] No family history on file. [4]  Current Facility-Administered Medications:  .  azithromycin  (ZITHROMAX ) 500 mg in sodium chloride  (NS) 0.9 % 250 mL IVPB-vialmate, 500 mg, Intravenous, Once, Fote, Ardeen Hanger, MD .  cefTRIAXone  (ROCEPHIN ) 2 g in sodium chloride  0.9 % (NS) 100 mL IVPB-connector bag, 2 g, Intravenous, Once, Fote, Ardeen Hanger, MD, Last Rate: 200 mL/hr at 05/12/24 0552, 2 g at 05/12/24 9447  Current Outpatient Medications:  .  acetaminophen  (TYLENOL ) 500 MG tablet, Take 1 tablet (500 mg total) by mouth every six (6) hours as needed., Disp: , Rfl:  .  azithromycin  (ZITHROMAX  Z-PAK) 250 MG tablet, Take 2 tablets (500 mg) on  Day 1,  followed by 1 tablet (250 mg) once daily on Days 2 through 5., Disp: 6 tablet, Rfl: 0 .  cetirizine (ZYRTEC) 10 MG tablet, Take 1 tablet (10 mg total) by mouth daily., Disp: 30 tablet, Rfl: 0 .  meloxicam  (MOBIC ) 15 MG tablet, Take 15 mg by mouth., Disp: , Rfl:  .  traZODone  (DESYREL ) 50 MG tablet, TAKE 1/2 TO 1 (ONE-HALF TO ONE) TABLET BY MOUTH AT BEDTIME AS NEEDED FOR SLEEP,  Disp: , Rfl:  [5] No Known Allergies

## 2024-05-12 NOTE — Nursing Note (Signed)
   05/12/24 1412  General  Care Manager / Social Worker assessed the patient by  In person interview with patient  Orientation Level Oriented X4  Functional level prior to admission Independent  Reason for referral Discharge Planning;Home Health  Advance Directive (Medical Treatment)  Does patient have an advance directive covering medical treatment? Patient has advance directive covering medical treatment, copy not in chart.  Health Care Decision Maker [HCDM] (Medical & Mental Health Treatment)  Healthcare Decision Maker HCDM documented in the HCDM/Contact Info section.  Readmission Assessment  Have you been hospitalized in the last 30 days? No  Patient Information   Lives with  Alone  Type of Residence Private residence  Location/Detail See demo  Support Systems/Concerns Friends/Neighbors;Children;Family Members  Responsibilities/Dependents at home? No  Home Care services in place prior to admission? No  Equipment Currently Used at Home walker, standard;cane, straight;wheelchair, manual;commode chair  Currently receiving outpatient dialysis? No  Financial Information  Need for financial assistance? No  Discharge Needs Assessment  Concerns to be Addressed discharge planning  Clinical risk factors > 65;Lives Alone or Absence of Caregiver to Assist with Discharge and Home Care  Barriers to taking medications No  Prior overnight hospital stay or ED visit in last 90 days No  Anticipated Changes Related to Illness none  Equipment Needed After Discharge none  Patient at risk for readmission? No  Difficult - Complex Assessment  Is patient identified as a difficult/complex discharge? No  Discharge Plan  Screen findings are Discharge planning needs identified or anticipated (Comment).  Quality data for continuing care services shared with patient and/or representative? Yes  Patient and/or family were provided with choice of facilities / services that are available and appropriate to meet  post hospital care needs? N/A  Initial Assessment complete? Yes

## 2024-05-12 NOTE — Care Plan (Signed)
 Shift Summary Knee pain improved from severe to none after acetaminophen  administration, with no further intervention required.  Moderate pitting edema in both feet and ankles subsided rapidly, and skin protection interventions were maintained.  Fall risk score decreased to zero and side rails remained up for safety throughout the shift.  Orientation and psychosocial status remained stable, with no reported stress or anxiety.  Overall, comfort and safety were supported, and no new hospital-acquired injuries or falls occurred.   Absence of Hospital-Acquired Illness or Injury: Skin protection measures were maintained throughout the shift, including frequent weight shifts, absorbent pads, and limited adhesive use; moderate pitting edema was present in both feet and ankles but subsided rapidly, and no new skin injuries were documented.   Optimal Comfort and Wellbeing: Knee pain decreased steadily from 8 to 0 on the pain scale during the shift, with acetaminophen  administered PRN and the patient declining further intervention later in the day.   Absence of Fall and Fall-Related Injury: Fall risk score dropped from 25 to 0 early in the shift, and side rails remained up for safety; no falls or injuries were reported.   Optimal Coping with Acute Illness: Orientation remained intact and psychosocial status was within defined limits, with no reported stress or anxiety.   Hemostasis: Hemoglobin and hematocrit were low in the morning, and hsTroponin I was mildly elevated with a small delta; no further changes in these labs were documented during the shift.

## 2024-05-12 NOTE — ED Provider Notes (Signed)
 Emergency Department Provider Note    ED Clinical Impression   Final diagnoses:  Acute on chronic congestive heart failure, unspecified heart failure type    (CMS-HCC) (Primary)  Hypoxia  Pneumonia due to infectious organism, unspecified laterality, unspecified part of lung    ED Assessment/Plan    Condition: Stable Disposition: Admit  This chart has been completed using Engineer, civil (consulting) software, and while attempts have been made to ensure accuracy, certain words and phrases may not be transcribed as intended.   History   Chief Complaint  Patient presents with  . Shortness of Breath   HPI  Gregory Pearson is a 89 y.o. male  who presents today to the  emergency department complaining of shortness of breath which started earlier today.  Patient denies any cough or congestion.  Denies any fever or chills.  Denies any chest pain.  He describes symptoms as moderate.  Symptoms symptoms worse with exertion.    He does have a history of oxygen dependent COPD.  Patient also has a history of CHF.  He was given a DuoNeb and 105 mg of Solu-Medrol  IV by EMS for wheezing.  Sats were in the 70s on his normal 3 L of oxygen.    Allergies: has no known allergies. Medications: has a current medication list which includes the following long-term medication(s): trazodone . PMHx:  has a past medical history of Arthritis and GERD (gastroesophageal reflux disease). PSHx:  has a past surgical history that includes Cholecystectomy and Appendectomy. SocHx:  reports that he has never smoked. He has never been exposed to tobacco smoke. He has never used smokeless tobacco. He reports that he does not drink alcohol and does not use drugs. Allergies, Medications, Medical, Surgical, and Social History were reviewed as documented above.   Social Drivers of Health with Concerns   Alcohol Use: Not on file  Housing: Not  on file  Physical Activity: Inactive (12/23/2023)   Received from [facility]   Exercise Vital Sign   . On average, how many days per week do you engage in moderate to strenuous exercise (like a brisk walk)?: 0 days   . On average, how many minutes do you engage in exercise at this level?: 20 min  Interpersonal Safety: Not on file  Substance Use: Not on file (08/19/2023)  Internet Connectivity: Not on file     Review Of Systems  Review of Systems  Constitutional:  Negative for fever.  HENT:  Negative for congestion.   Respiratory:  Positive for shortness of breath. Negative for chest tightness.   Cardiovascular:  Negative for chest pain.  Gastrointestinal:  Negative for abdominal pain.  Skin:  Negative for color change.  Psychiatric/Behavioral:  Negative for behavioral problems.   All other systems reviewed and are negative.   Physical Exam   BP 119/79   Pulse 99   Temp 37.2 C (99 F) (Oral)   Resp 25   Ht 182.9 cm (6')   Wt 96.1 kg (211 lb 12.8 oz)   SpO2 90%   BMI 28.73 kg/m   Physical Exam Vitals and nursing note reviewed.  Constitutional:      General: He is in acute distress.  HENT:     Head: Normocephalic.  Eyes:     Conjunctiva/sclera: Conjunctivae normal.  Cardiovascular:  Rate and Rhythm: Regular rhythm.     Pulses: Normal pulses.     Heart sounds: Normal heart sounds.  Pulmonary:     Effort: No respiratory distress.     Breath sounds: Wheezing, rhonchi and rales present.     Comments: Diffuse scattered wheezes and rhonchi/rales in the lung bases. Abdominal:     General: There is no distension.  Musculoskeletal:        General: No deformity.     Right lower leg: Edema present.     Left lower leg: Edema present.     Comments: Bilateral extremity pitting edema  Skin:    General: Skin is warm.     Capillary Refill: Capillary refill takes 2 to 3 seconds.     Comments: Normal cap refill.  Neurological:     General: No focal deficit present.   Psychiatric:        Mood and Affect: Mood normal.     ED Course  Medical Decision Making Differential diagnosis includes CHF exacerbation versus COPD versus pneumonia versus bronchitis.  EKG shows normal sinus rhythm at 97 bpm.  PVCs.  Prolonged QTc.  No acute injury pattern.  4:39 AM Patient is doing somewhat better but still working hard to breathe.  Chest x-ray does show pleural effusion.  Lasix  ordered.  5:35 AM Given significant leukocytosis and x-ray findings, will also cover for possible pneumonia given the presentation is more consistent with CHF.  Patient's care discussed with Dr. Dorian, hospitalist.  Will admit.  Problems Addressed: Acute on chronic congestive heart failure, unspecified heart failure type    (CMS-HCC): acute illness or injury that poses a threat to life or bodily functions Hypoxia: acute illness or injury that poses a threat to life or bodily functions Pneumonia due to infectious organism, unspecified laterality, unspecified part of lung: acute illness or injury that poses a threat to life or bodily functions  Amount and/or Complexity of Data Reviewed Independent Historian: EMS Labs: ordered. Decision-making details documented in ED Course. Radiology: ordered and independent interpretation performed. Decision-making details documented in ED Course.    Details: CHF with possible pulmonary edema Discussion of management or test interpretation with external provider(s): Patient's care discussed with hospitalist.  Risk Decision regarding hospitalization.     Procedures   Encounter Date: 05/12/24  ECG 12 Lead  Result Value   EKG Systolic BP    EKG Diastolic BP    EKG Ventricular Rate 97   EKG Atrial Rate 97   EKG P-R Interval 144   EKG QRS Duration 102   EKG Q-T Interval 388   EKG QTC Calculation 492   EKG Calculated P Axis 40   EKG Calculated R Axis 38   EKG Calculated T Axis 73   QTC Fredericia 455   Narrative   Sinus rhythm with  frequent and consecutive premature ventricular complexes Nonspecific ST abnormality Prolonged QT Abnormal ECG No previous ECGs available Confirmed by Cherie Searle ([id number]) on 05/12/2024 4:34:11 AM     ED Results Results for orders placed or performed during the hospital encounter of 05/12/24  Rapid Influenza / RSV / COVID PCR   Specimen: Nasopharyngeal Swab  Result Value Ref Range   SARS-CoV-2 PCR Negative Negative   Influenza A Negative Negative   Influenza B Negative Negative   RSV Negative Negative  Comprehensive Metabolic Panel  Result Value Ref Range   Sodium 138 135 - 145 mmol/L   Potassium 4.0 3.5 - 5.0 mmol/L   Chloride 102 98 -  107 mmol/L   CO2 31.9 21.0 - 32.0 mmol/L   Anion Gap 4 3 - 11 mmol/L   BUN 25 (H) 8 - 20 mg/dL   Creatinine 9.29 (L) 9.19 - 1.30 mg/dL   BUN/Creatinine Ratio 36    eGFR CKD-EPI (2021) Male 85 >=60 mL/min/1.69m2   Glucose 148 70 - 179 mg/dL   Calcium  8.4 (L) 8.5 - 10.1 mg/dL   Albumin 2.7 (L) 3.5 - 5.0 g/dL   Total Protein 7.2 6.0 - 8.0 g/dL   Total Bilirubin 0.7 0.3 - 1.2 mg/dL   AST 21 15 - 40 U/L   ALT 22 12 - 78 U/L   Alkaline Phosphatase 113 46 - 116 U/L  Lactate Sepsis  Result Value Ref Range   Lactate 1.0 0.5 - 1.9 mmol/L  Pro-BNP  Result Value Ref Range   PRO-BNP 2,043.0 (H) 0.0 - 450.0 pg/mL  hsTroponin I (serial 0-2-6H w/ delta)  Result Value Ref Range   hsTroponin I 15 <=53 ng/L  ECG 12 Lead  Result Value Ref Range   EKG Systolic BP  mmHg   EKG Diastolic BP  mmHg   EKG Ventricular Rate 97 BPM   EKG Atrial Rate 97 BPM   EKG P-R Interval 144 ms   EKG QRS Duration 102 ms   EKG Q-T Interval 388 ms   EKG QTC Calculation 492 ms   EKG Calculated P Axis 40 degrees   EKG Calculated R Axis 38 degrees   EKG Calculated T Axis 73 degrees   QTC Fredericia 455 ms  Arterial Blood Gas  Result Value Ref Range   pH, Arterial 7.40 7.35 - 7.45   pCO2, Arterial 45.4 35.0 - 48.0 mm[Hg]   pO2, Arterial 59 (L) 83 - 108 mm[Hg]   HCO3  (Bicarbonate), Arterial 28.0 (H) 18.0 - 23.0 mmol/L   Base Excess, Arterial 2.7 -2.0 - 3.0 mmol/L   O2 Sat, Arterial 90.8 (L) 95.0 - 98.0 %   Total Carbon Dioxide, Arterial 29 22 - 29 mmol/L   Carboxyhemoglobin 1.8 0.0 - 4.9 %   ABG Allen Test POS    BG Draw Site Radial, right   CBC w/ Differential  Result Value Ref Range   WBC 22.0 (H) 4.0 - 10.5 10*9/L   RBC 3.98 (L) 4.10 - 5.60 10*12/L   HGB 12.0 (L) 12.5 - 17.0 g/dL   HCT 62.6 63.9 - 49.9 %   MCV 93.7 80.0 - 98.0 fL   MCH 30.2 27.0 - 34.0 pg   MCHC 32.2 32.0 - 36.0 g/dL   RDW 85.0 (H) 88.4 - 85.4 %   MPV 9.8 7.4 - 10.4 fL   Platelet 250 140 - 415 10*9/L  Manual Differential  Result Value Ref Range   Neutrophils % 87 %   Lymphocytes % 6 %   Monocytes % 3 %   Eosinophils % 3 %   Bands % 1 %   Absolute Neutrophils 19.4 (H) 1.8 - 7.8 10*9/L   Absolute Lymphocytes 1.3 0.7 - 4.5 10*9/L   Absolute Monocytes 0.7 0.1 - 1.0 10*9/L   Absolute Eosinophils 0.7 (H) 0.0 - 0.4 10*9/L   Absolute Basophils     ECG 12 Lead Result Date: 05/12/2024 Sinus rhythm with frequent and consecutive premature ventricular complexes Nonspecific ST abnormality Prolonged QT Abnormal ECG No previous ECGs available Confirmed by Cherie Searle ([id number]) on 05/12/2024 4:34:11 AM  XR Chest Portable Result Date: 05/12/2024 Exam:  Portable Chest  History:  Shortness of breath  Technique: Single frontal view of the chest.  Comparison:  None.    Findings:  There is heterogeneous opacity throughout the lungs. Diminished lung volumes. No visible effusion or pneumothorax. Mediastinal contours are unremarkable.        Heterogeneous opacities compatible with edema or infection.      Signed (Electronic Signature): 05/12/2024 4:24 AM Signed By: Emeline KATHEE Milroy, MD   Medications Administered:  Medications  cefTRIAXone  (ROCEPHIN ) 2 g in sodium chloride  0.9 % (NS) 100 mL IVPB-connector bag (has no administration in time range)  azithromycin  (ZITHROMAX ) 500 mg in sodium  chloride (NS) 0.9 % 250 mL IVPB-vialmate (has no administration in time range)  ipratropium-albuterol (DUO-NEB) 0.5-2.5 mg/3 mL nebulizer solution 3 mL (3 mL Nebulization Given 05/12/24 0358)  furosemide  (LASIX ) injection 40 mg (40 mg Intravenous Given 05/12/24 0441)    Discharge Medications (Medications Prescribed during this  ED visit and Patient's Home Medications) :    Your Medication List     ASK your doctor about these medications    acetaminophen  500 MG tablet Commonly known as: TYLENOL  Take 1 tablet (500 mg total) by mouth every six (6) hours as needed.   azithromycin  250 MG tablet Commonly known as: ZITHROMAX  Z-PAK Take 2 tablets (500 mg) on  Day 1,  followed by 1 tablet (250 mg) once daily on Days 2 through 5.   cetirizine 10 MG tablet Commonly known as: ZYRTEC Take 1 tablet (10 mg total) by mouth daily.   meloxicam  15 MG tablet Commonly known as: MOBIC  Take 15 mg by mouth.   traZODone  50 MG tablet Commonly known as: DESYREL  TAKE 1/2 TO 1 (ONE-HALF TO ONE) TABLET BY MOUTH AT BEDTIME AS NEEDED FOR SLEEP          Fote, Ardeen Hanger, MD 05/12/24 [phone]

## 2024-05-13 ENCOUNTER — Ambulatory Visit: Admitting: Student

## 2024-05-13 NOTE — Nursing Note (Signed)
   05/13/24 1341  Rapid Rounds  Attendance Nursing;Physician;Advanced Practice Provider (APP);Case Manager;Dietitian/Nutrition Services;Occupational Therapy;Pharmacy;Physical Therapy;Respiratory Therapy;Social Work/Services;Speech Language Pathology  Expected Discharge Disposition [facility] Services  Today we still await: Clinical stability   Will need HH services at discharge. On 2L oxygen at baseline. Will need to follow up with Cardiology at discharge, appointment already made. Will discharge in 1-2 days.

## 2024-05-13 NOTE — Progress Notes (Signed)
 PROGRESS NOTE [facility] 05/13/24    Patient name: Gregory Pearson DOB 1927/11/12 MRN#: [id number] PCP: Dettinger, Fonda Righter, MD Time: 2:37 PM Primary Care Provider:  Dettinger, Fonda Righter, MD Inpatient primary attending provider: Joana Marice Hurst, [place]   Hospital Course: 05/12/24 Admitted for acute hypoxic respiratory failure (wears 2 L at baseline), 88% on 3L with resp rate 28 and increased work of breathing.  PNA with CHF.  Stools are black, occult positive, history of ulcer, hg 12.0. On SCDs due to history of GI bleed  _____________________________________     Admission HPI  Patient admitted on: 05/12/2024  3:37 AM  Patient admitted by: Thersia Roger, D.O.   CHIEF COMPLAINT:  SOB   Day of admission HPI:  Augie Vane  is a 89 y.o. male with a PMH significant for HFpEF OA, GERD, GI bleed, severe aortic stenosis, COPD on 2L at baseline, who presented with SOB, cough and congestion.   Patient lives independently, still drives at night and goes dancing twice weekly.  Has noticed DOE worsening in the past week.   Was discharged from [facility] on 04/16/24 after hospitalization for pneumonia and acute exacerbation of CHF.    Last echo 8/25 - EF 60-65%, moderate aortic stenosis, LV diastolic function could not be evaluated however last echo 9/24 showed grade 1 diastolic dysfunction.   In the ED the patient received Rocephin , Azithro, furosemide , Duoneb.   Medical admission was requested for further workup and management of CHF exacerbation, likely pneumonia.     Patient admitted on Home O2? - Yes, 2L Patient on home anticoagulant? -  No Patient admitted with Chronic home foley catheter? - No Foley catheter placed or replaced by another service prior to admission? - No Central Line Status: None   Mental Status on Admission: The patient IS Alert and oriented to PERSON The patient IS  Alert And oriented to TIME The patient IS Alert and oriented to LOCATION    Today; pt is sitting up in bed awake and alert.  States his shortness of breath is improved until he moves around, then he gets very short of breath.  Currently 92% on 3L.  Just had a black looking bowel movement.   Problem List, Assessment & Plan     ASSESSMENT & PLAN (In order of descending acuity)   89 y.o. male with a PMH significant for HFpEF, OA, GERD, GI bleed, severe aortic stenosis, COPD on 3L at baseline now admitted with:   Acute hypoxic respiratory failure 2/2 exacerbation of CHF, likely pneumonia Patient is symptomatic with SOB, LE edema  Labs: WBC 22K => WBC 15.8, BNP 2043, trop negative.  PO2 59 on 3L as of 05/12/2024 at 4 AM Imaging reveals: CXR - opacities c/w infection or edema TSH normal   CHF, diastolic, acute on chronic IV Lasix  for diuresis (1750 mL diuresed September 17 and 1150 mL diuresed so far September 18 as of 2:30 PM) Requiring more oxygen than baseline, nebs prn => 05/13/2024 is on 2 L of O2 and and saturating at 92 to 95% comfortably Strict I & O, daily weight Trops flat, 15,17.19 Follows outpatient with cardiology, was scheduled for hospital follow up tomorrow Appt rescheduled by case management for 11/14 BNP 2043   Hospital acquired pneumonia IV Zosyn to cover for HCAP (3.375 g, Intravenous, at 25 mL/hr, Every 8 hours, First dose on Wed 05/12/24 at 1200, For 5 day )considering recent hospitalizations.  Check sputum culture and resp path panel (negative) Blood culture  from 917 2025 x 2 equals negative = no growth at 24 hours WBC 22.0, 05/13/2024 => 15.8; afebrile   History of GI bleed, stomach ulcers Sees GI in [place], follow up scheduled for tomorrow (will reschedule) Upper endo spring 2025 per pt Protonix  IV BID Trend hg; was 12.0, down to 11.3 at 10AM Stool occult positive Continue iron tablet daily   COPD Wears 2L at baseline, used to work at [employer] and had absestos exposure Does not follow with pulmonology   GAD  Continue  Zoloft  Pt calm, cooperative   Anticoagulation; none, SCD's, GI Bleed Oxygen; 3L => 05/13/2024 2 L of O2 and saturating at 92 to 95%; using incentive spirometry every waking hour IVF; none IV antibx; Zosyn 3.375 g, Intravenous, at 25 mL/hr, Every 8 hours, First dose on Wed 05/12/24 at 1200, For 5 day cover for HCAP Code status; DNR/DNI   Continue current treatment plan.   IV diuresis, => 40 mg IV furosemide  daily iV antibx, => IV Zosyn 3.375 g every 8 hours x 5 days starting 05/12/2024 oxygen supplementation, started on 3 L of O2 but now on 2 L of O2; using incentive spirometry every waking hour frequent monitoring of H & H => 9/17 = Hb = 12.0; => 9/18 = Hb=11.3 Discussed case with Dr. Bettejane .      DVT prophylaxis; SCD's (history of GI bleed)   ADDITIONAL NON-ACUTE FINDINGS, OBSERVATIONS, FAMILY DISCUSSIONS, ETC. (When present):    Physical exam General: 89 year old white male in no acute distress HEENT /AT; moist mucous membrane Heart: Heart rate wnl = 70s to 80s: Rhythm is reg    Lungs: Saturating between 92 to 95% on 2L of supplemental O2; faint rales bilaterally; good air movement; speaking in complete sentences; states that he feels more comfortable and better today than yesterday; using incentive spirometry every hour Abdomen: Soft nontender nondistended Extremities: Bilateral peripheral edema Skin: Dry; no rash Neuropsych: Within normal limits; awake alert and oriented x 3;  Discharge planning => Follows outpatient with cardiology, was scheduled for hospital follow up tomorrow Cardiology outpatient appt rescheduled by case management for 11/14  _____________________________________    Temp:  [36.4 C (97.5 F)-36.8 C (98.2 F)] 36.4 C (97.5 F) Pulse:  [67-98] 73 Resp:  [16-20] 18 BP: (98-120)/(67-81) 98/67 FiO2 (%):  [28 %] 28 % SpO2:  [92 %-97 %] 92 % Body mass index is 27.72 kg/m. Intake/Output last 3 shifts: I/O last 3 completed shifts: In: 1910 [P.O.:1560;  IV Piggyback:350] Out: 1750 [Urine:1750]  Consults Requested  IP CONSULT TO HOSPITALIST IP CONSULT TO CARDIOLOGY     In hospital Nutrition: Nutrition Therapy Heart Healthy; Fluid 1800 ml    An advanced care planning discussion was had with patient and/or patient's decisions maker (documented separately).  CODE STATUS :                    DNR and DNI   Discharge estimated within 1- 2 days. Anticipated disposition:  To Home with Home Health ______________________________________________________________  Current Medications[1] ________________________________________________________________  Allergies  Allergen Reactions  . Nsaids (Non-Steroidal Anti-Inflammatory Drug) Other (See Comments)    GI bleed, PUD     Past Medical History[2]  Past Surgical History[3]   Family History[4]        Imaging  ECG 12 Lead Result Date: 05/12/2024 Sinus rhythm with premature atrial complexes with aberrant conduction Nonspecific ST abnormality Abnormal ECG When compared with ECG of 12-May-2024 03:49, premature ventricular complexes are no longer present  aberrant conduction is now present Confirmed by Cherie Searle ([id number]) on 05/12/2024 11:47:08 PM  ECG 12 Lead Result Date: 05/12/2024 Sinus rhythm with frequent and consecutive premature ventricular complexes Nonspecific ST abnormality Prolonged QT Abnormal ECG No previous ECGs available Confirmed by Cherie Searle ([id number]) on 05/12/2024 4:34:11 AM  XR Chest Portable Result Date: 05/12/2024 Exam:  Portable Chest  History:  Shortness of breath    Technique: Single frontal view of the chest.  Comparison:  None.    Findings:  There is heterogeneous opacity throughout the lungs. Diminished lung volumes. No visible effusion or pneumothorax. Mediastinal contours are unremarkable.        Heterogeneous opacities compatible with edema or infection.      Signed (Electronic Signature): 05/12/2024 4:24 AM Signed By: Emeline KATHEE Milroy, MD   Lab Results    Recent Labs    05/13/24 0412  WBC 15.8*  HGB 11.3*  HCT 35.4*  PLT 253   Recent Labs    05/12/24 0414 05/13/24 0412  NA 138 134*  K 4.0 3.7  CL 102 97*  CO2 31.9 32.5*  BUN 25* 24*  CREATININE 0.70* 0.82  GLU 148 101  CALCIUM  8.4* 8.4*  ALBUMIN 2.7*  --   PROT 7.2  --   BILITOT 0.7  --   AST 21  --   ALT 22  --   ALKPHOS 113  --   MG  --  2.2  LACTATE 1.0  --    Recent Labs    05/12/24 1011  TROPONINI 19   No results for input(s): WBCUA, NITRITE, LEUKOCYTESUR, BACTERIA, RBCUA, BLOODU, GLUCOSEU, PROTEINUA, KETONESU, KETUR in the last 72 hours. No results for input(s): OPIAU, BENZU, TRICYCLIC, PCPU, AMPHU, COCAU, CANNAU, BARBU, ETOH, ACETAMIN, SALICYLATE in the last 72 hours. No results for input(s): PREGTESTUR, PREGPOC in the last 72 hours. No results for input(s): OCCULTBLD, RAPSCRN, CDIFRPCR, CDIFFNAP1, A1C, CHOL, LDL, HDL, TRIG in the last 72 hours. Recent Labs    05/12/24 0354  PHART 7.40  PCO2ART 45.4  PO2ART 59*  HCO3ART 28.0*  O2SATART 90.8*  BEART 2.7   Pending Labs     Order Current Status   Blood Culture Preliminary result   Blood Culture Preliminary result       Joana JONETTA Hurst, PA Hospitalist, [facility] 05/13/24, 2:37 PM       [1]  Current Facility-Administered Medications:  .  acetaminophen  (TYLENOL ) tablet 650 mg, 650 mg, Oral, Q4H PRN, Hugelmeyer, Alexis, DO, 650 mg at 05/12/24 1341 .  albuterol 2.5 mg /3 mL (0.083 %) nebulizer solution 2.5 mg, 2.5 mg, Nebulization, Q6H PRN, Hugelmeyer, Alexis, DO .  aluminum-magnesium  hydroxide-simethicone  (MAALOX MAX) 80-80-8 mg/mL oral suspension, 30 mL, Oral, Q4H PRN, Hugelmeyer, Alexis, DO .  ascorbic acid (vitamin C) (VITAMIN C) tablet 500 mg, 500 mg, Oral, Daily, Pace, Hagan Eggleston, FNP, 500 mg at 05/13/24 0935 .  bisacodyl (DULCOLAX) EC tablet 10 mg, 10 mg, Oral, Daily PRN, Hugelmeyer, Alexis, DO .  empagliflozin  (JARDIANCE) tablet 10 mg, 10 mg, Oral, Daily, Pace, Hagan Eggleston, FNP, 10 mg at 05/13/24 0935 .  enoxaparin  (LOVENOX ) syringe 40 mg, 40 mg, Subcutaneous, Q24H, Hugelmeyer, Alexis, DO, 40 mg at 05/13/24 1203 .  ferrous sulfate  tablet 325 mg, 325 mg, Oral, Daily, Pace, Hagan Eggleston, FNP, 325 mg at 05/13/24 0935 .  furosemide  (LASIX ) injection 40 mg, 40 mg, Intravenous, Daily, Pace, Hagan Eggleston, FNP, 40 mg at 05/13/24 0936 .  guaiFENesin (ROBITUSSIN) oral syrup, 200 mg, Oral, Q4H PRN,  Hugelmeyer, Alexis, DO .  melatonin tablet 3 mg, 3 mg, Oral, Nightly PRN, Hugelmeyer, Alexis, DO .  metoPROLOL  tartrate (Lopressor ) tablet 12.5 mg, 12.5 mg, Oral, BID, Pace, Hagan Eggleston, FNP, 12.5 mg at 05/13/24 0935 .  morphine  4 mg/mL injection 1 mg, 1 mg, Intravenous, Q4H PRN, Hugelmeyer, Alexis, DO, 1 mg at 05/12/24 0805 .  naloxone (NARCAN) injection 0.1 mg, 0.1 mg, Intravenous, Q5 Min PRN, Hugelmeyer, Alexis, DO .  ondansetron  (ZOFRAN -ODT) disintegrating tablet 4 mg, 4 mg, Oral, Q8H PRN **OR** ondansetron  (ZOFRAN -ODT) disintegrating tablet 8 mg, 8 mg, Oral, Q8H PRN, Hugelmeyer, Alexis, DO .  [Provider Hold] pantoprazole  (Protonix ) EC tablet 40 mg, 40 mg, Oral, Daily before breakfast, Pace, Hagan Eggleston, FNP .  pantoprazole  (Protonix ) injection 40 mg, 40 mg, Intravenous, BID, Pace, Hagan Eggleston, FNP, 40 mg at 05/13/24 0936 .  piperacillin-tazobactam (ZOSYN) 3.375 g in sodium chloride  0.9 % (NS) 100 mL IVPB-connector bag, 3.375 g, Intravenous, Q8H, Hugelmeyer, Alexis, DO, Last Rate: 25 mL/hr at 05/13/24 1208, 3.375 g at 05/13/24 1208 .  polyethylene glycol (MIRALAX ) packet 17 g, 17 g, Oral, Daily PRN, Hugelmeyer, Alexis, DO .  prochlorperazine (COMPAZINE) injection 2.5 mg, 2.5 mg, Intravenous, Q8H PRN **OR** prochlorperazine (COMPAZINE) injection 5 mg, 5 mg, Intravenous, Q8H PRN, Hugelmeyer, Alexis, DO .  sertraline  (ZOLOFT ) tablet 100 mg, 100 mg, Oral, Daily, Pace, Hagan Eggleston, FNP, 100 mg at  05/13/24 0936 .  [Provider Hold] torsemide  (DEMADEX ) tablet 20 mg, 20 mg, Oral, Daily, Pace, Hagan Eggleston, FNP .  traMADol (ULTRAM) tablet 50 mg, 50 mg, Oral, Q6H PRN, Pace, Hagan Eggleston, FNP .  traZODone  (DESYREL ) tablet 50 mg, 50 mg, Oral, Nightly PRN, Pace, Hagan Eggleston, FNP [2] Past Medical History: Diagnosis Date  . Arthritis   . GERD (gastroesophageal reflux disease)   [3] Past Surgical History: Procedure Laterality Date  . APPENDECTOMY    . CHOLECYSTECTOMY    [4] History reviewed. No pertinent family history.

## 2024-05-13 NOTE — Care Plan (Signed)
 Shift Summary Oxygen flow was reduced to home baseline early in the shift and SpO2 remained above 92%.  Blood pressure and MAP decreased but did not require additional intervention.  One dose of piperacillin-tazobactam (ZOSYN) injection was administered during the shift.  Fall prevention strategies were maintained and no falls or injuries occurred.  Overall, vital signs and intake/output were monitored and remained stable throughout the shift.  Absence of Fall and Fall-Related Injury: Fall reduction interventions, hourly visual checks, and bed safety measures were consistently maintained throughout the shift, with no documented falls or injuries.  Blood Pressure in Desired Range: Blood pressure trended lower during the shift, with MAP decreasing from 88 to 72, but remained within a range that did not prompt additional intervention.  Fluid and Electrolyte Balance: Oral intake increased over the shift and bowel function remained stable, with one formed stool and no incontinence noted.  Effective Oxygenation and Ventilation: Oxygen flow was successfully weaned to home baseline early in the shift, SpO2 levels remained above 92%, and breath sounds were clear with a regular respiratory pattern documented.

## 2024-05-13 NOTE — Consults (Signed)
 PT screen. Pt currently independent-Mod I with bed mobility, STS transfer, and gait x280 feet with standing frame walker (which pt uses at baseline) with no loss of balance. Pt reports to be at baseline mobility with no further acute or post-acute PT needs. Will place pt on mobility program for ambulation to prevent functional decline while hospitalized. DC PT. Thank you for this consultation.

## 2024-05-13 NOTE — Care Plan (Signed)
 Shift Summary Oxygen therapy was maintained throughout the shift, with SpO2 values remaining above 94% and respiratory pattern regular and unlabored. piperacillin-tazobactam (ZOSYN) injection 3.375 g was administered twice and stopped once during the shift. ECG revealed changes in cardiac rhythm, with aberrant conduction and persistent premature atrial complexes, but no premature ventricular complexes compared to prior ECG. Fall prevention measures were consistently implemented, and no falls or injuries occurred during the shift. Patient remained stable with no acute changes in fluid balance, gastrointestinal status, or coping, and was awake and oriented during checks.  Absence of Fall and Fall-Related Injury: Fall reduction program and safety interventions were consistently maintained throughout the shift, with hourly visual checks confirming the patient remained in bed and the room door stayed open for monitoring; no falls or injuries occurred.  Optimal Coping with Acute Illness: Oral care was performed and the patient remained oriented and awake during checks, with no documentation of distress or confusion; coping appeared adequate during the shift.  Stable Heart Rate and Rhythm: Heart rate remained mostly within a moderate range, though there were intermittent premature ventricular and atrial contractions noted; ECG showed changes in rhythm with aberrant conduction and no premature ventricular complexes compared to prior, but rhythm disturbances persisted.  Fluid and Electrolyte Balance: Oral intake was documented and weight was stable, with bowel sounds active and passing flatus noted; no acute changes in gastrointestinal status were observed.  Effective Oxygenation and Ventilation: Oxygen therapy via nasal cannula at 3 L/min was maintained, SpO2 values remained above 94% throughout the shift, and breath sounds were clear or diminished at times; respiratory rate and pattern stayed regular and  unlabored.

## 2024-05-14 NOTE — Care Plan (Signed)
 Shift Summary Piperacillin-tazobactam was administered twice during the shift, with a period when it was stopped.  Blood cultures were collected and showed no growth at 48 hours.  Oxygen therapy was maintained via nasal cannula, and SpO2 levels remained between 93-96%.  Sleep medications were given PRN to support rest.  Overall, vital signs and IV site status remained stable, and no acute interventions were required.   Readiness for Transition of Care: Unplanned readmission score increased slightly during the shift, and blood cultures collected showed no growth at 48 hours; IV site remained clean, dry, and intact with no intervention needed.   Blood Pressure in Desired Range: Blood pressure remained within a moderate range throughout the shift, with a slight downward trend but no abrupt changes.   Effective Oxygenation and Ventilation: Oxygen therapy via nasal cannula was maintained, SpO2 fluctuated between 93-96%, and FiO2 was 28%; breath sounds were diminished but chest expansion was symmetrical.   Effective Breathing Pattern During Sleep: Respiratory rate remained stable and regular, and sleep medications were administered as needed.

## 2024-05-17 ENCOUNTER — Ambulatory Visit: Admitting: Family Medicine

## 2024-05-17 ENCOUNTER — Telehealth: Payer: Self-pay | Admitting: *Deleted

## 2024-05-17 ENCOUNTER — Encounter: Payer: Self-pay | Admitting: Family Medicine

## 2024-05-17 VITALS — BP 107/58 | HR 72 | Temp 97.2°F | Ht 72.0 in | Wt 201.4 lb

## 2024-05-17 DIAGNOSIS — J189 Pneumonia, unspecified organism: Secondary | ICD-10-CM

## 2024-05-17 DIAGNOSIS — J449 Chronic obstructive pulmonary disease, unspecified: Secondary | ICD-10-CM

## 2024-05-17 DIAGNOSIS — I5033 Acute on chronic diastolic (congestive) heart failure: Secondary | ICD-10-CM

## 2024-05-17 DIAGNOSIS — Y95 Nosocomial condition: Secondary | ICD-10-CM

## 2024-05-17 DIAGNOSIS — Z23 Encounter for immunization: Secondary | ICD-10-CM | POA: Diagnosis not present

## 2024-05-17 DIAGNOSIS — J441 Chronic obstructive pulmonary disease with (acute) exacerbation: Secondary | ICD-10-CM | POA: Diagnosis not present

## 2024-05-17 MED ORDER — ALBUTEROL SULFATE HFA 108 (90 BASE) MCG/ACT IN AERS: 2.0000 | INHALATION_SPRAY | Freq: Four times a day (QID) | RESPIRATORY_TRACT | 1 refills | Status: DC | PRN

## 2024-05-17 NOTE — Progress Notes (Signed)
 BP (!) 107/58   Pulse 72   Temp (!) 97.2 F (36.2 C)   Ht 6' (1.829 m)   Wt 201 lb 6.4 oz (91.4 kg)   SpO2 97%   BMI 27.31 kg/m    Subjective:   Patient ID: Gregory Pearson, male    DOB: 09-01-1927, 89 y.o.   MRN: [id number]  HPI: Gregory Pearson is a 89 y.o. male presenting on 05/17/2024 for Hospitalization Follow-up   Discussed the use of AI scribe software for clinical note transcription with the patient, who gave verbal consent to proceed.  History of Present Illness   Gregory Pearson is a 89 year old male with COPD and CHF who presents for hospital follow-up after treatment for acute hypoxic respiratory failure and pneumonia.  He was admitted to [facility] from September 17 to May 14, 2024, for acute hypoxic respiratory failure and COPD exacerbation. He was treated for pneumonia and CHF.  He is currently on two liters of oxygen at home. His breathing is good with the oxygen, and he is making good urine output. He only had to get up once last night to urinate. He has no swelling in his legs and his breathing is good as long as he has the oxygen on.  He was treated for hospital-acquired pneumonia with Augmentin  and ciprofloxacin and was given guaifenesin for cough. He started these antibiotics recently and is expected to continue them for the rest of the week and a few more days.  He has a history of congestive heart failure, and his caregiver mentions concerns about fluid retention and the need for a portable oxygen concentrator due to issues with the current oxygen setup causing potential tripping hazards.  He experienced a recent episode of delirium, which led to a temporary loss of his driving privileges. His caregiver recounts an incident where he became confused and ended up in an unfamiliar location, leading to police involvement. He is currently not driving, and his caregiver has taken his keys.  He is taking ferrous sulfate  for iron supplementation, which  may be causing darker stools. He is also on Farxiga , which is for his kidneys and urine output. He has not been taking Pepto Bismol recently.  He has a history of a calcified heart valve and a systolic murmur.          Relevant past medical, surgical, family and social history reviewed and updated as indicated. Interim medical history since our last visit reviewed. Allergies and medications reviewed and updated.  Review of Systems  Constitutional:  Negative for chills and fever.  HENT:  Positive for congestion.   Eyes:  Negative for visual disturbance.  Respiratory:  Positive for cough, shortness of breath and wheezing.   Cardiovascular:  Negative for chest pain and leg swelling.  Musculoskeletal:  Negative for back pain and gait problem.  Skin:  Negative for rash.  Neurological:  Negative for dizziness and light-headedness.  All other systems reviewed and are negative.   Per HPI unless specifically indicated above   Allergies as of 05/17/2024       Reactions   Nsaids Other (See Comments)   GI bleed, PUD        Medication List        Accurate as of May 17, 2024  9:32 AM. If you have any questions, ask your nurse or doctor.          acetaminophen  500 MG tablet Commonly known as: TYLENOL  Take 1  tablet (500 mg total) by mouth every 6 (six) hours as needed.   albuterol  108 (90 Base) MCG/ACT inhaler Commonly known as: VENTOLIN  HFA Inhale 2 puffs into the lungs every 6 (six) hours as needed for wheezing or shortness of breath. Started by: Fonda [facility]OR Danis [place]   ascorbic acid 500 MG tablet Commonly known as: VITAMIN C Take 500 mg by mouth daily.   aspirin  81 MG chewable tablet Chew 1 tablet (81 mg total) by mouth daily with breakfast.   dapagliflozin  propanediol 10 MG Tabs tablet Commonly known as: Farxiga  Take 1 tablet (10 mg total) by mouth daily before breakfast.   ferrous sulfate  325 (65 FE) MG EC tablet Take 65 mg by mouth as needed.   fluticasone   50 MCG/ACT nasal spray Commonly known as: FLONASE  Place 1 spray into both nostrils 2 (two) times daily as needed for allergies or rhinitis.   magnesium  84 MG ( ) Tbcr SR tablet Commonly known as: MAGTAB Take 84 mg by mouth daily.   metoprolol  tartrate 25 MG tablet Commonly known as: LOPRESSOR  Take 0.5 tablets (12.5 mg total) by mouth 2 (two) times daily.   multivitamin tablet Take 1 tablet by mouth daily.   pantoprazole  40 MG tablet Commonly known as: PROTONIX  Take 1 tablet (40 mg total) by mouth daily.   sertraline  100 MG tablet Commonly known as: Zoloft  Take 1 tablet (100 mg total) by mouth daily.   torsemide  20 MG tablet Commonly known as: DEMADEX  Take 1 tablet (20 mg total) by mouth daily.   traMADol 50 MG tablet Commonly known as: ULTRAM Take 50 mg by mouth every 6 (six) hours as needed for moderate pain (pain score 4-6).   traZODone  100 MG tablet Commonly known as: DESYREL  Take 1 tablet (100 mg total) by mouth at bedtime as needed. for sleep   vitamin B-12 100 MCG tablet Commonly known as: CYANOCOBALAMIN Take 100 mcg by mouth daily.   VITAMIN B-2 PO Take 1 tablet by mouth daily.               Durable Medical Equipment  (From admission, onward)           Start     Ordered   05/17/24 0000  For home use only DME oxygen       Comments: Portable oxygen concentrator Patient already on oxygen 2 L nasal cannula for COPD and CHF Frequent issues with cord problems including tripping hazard and needs compression of the cord due to his walker, for this reason he needs portable oxygen concentrator to be able to move around  Question Answer Comment  Length of Need Lifetime   Mode or (Route) Nasal cannula   Liters per Minute -1   Oxygen delivery system Gas      05/17/24 0930             Objective:   BP (!) 107/58   Pulse 72   Temp (!) 97.2 F (36.2 C)   Ht 6' (1.829 m)   Wt 201 lb 6.4 oz (91.4 kg)   SpO2 97%   BMI 27.31 kg/m   Wt  Readings from Last 3 Encounters:  05/17/24 201 lb 6.4 oz (91.4 kg)  04/21/24 211 lb 12.8 oz (96.1 kg)  04/20/24 207 lb (93.9 kg)    Physical Exam Vitals and nursing note reviewed.  Constitutional:      Appearance: Normal appearance.  Musculoskeletal:        General: No swelling.  Neurological:  Mental Status: He is alert.    Physical Exam   CHEST: Wheezing on expiration. CARDIOVASCULAR: Regular heart sounds with a systolic murmur present.         Assessment & Plan:   Problem List Items Addressed This Visit   None Visit Diagnoses       Hospital-acquired pneumonia    -  Primary   Relevant Medications   albuterol  (VENTOLIN  HFA) 108 (90 Base) MCG/ACT inhaler   Other Relevant Orders   CBC with Differential/Platelet   CMP14+EGFR   For home use only DME oxygen     Encounter for immunization       Relevant Orders   Flu vaccine HIGH DOSE PF(Fluzone Trivalent) (Completed)     Acute on chronic diastolic congestive heart failure (HCC)       Relevant Medications   albuterol  (VENTOLIN  HFA) 108 (90 Base) MCG/ACT inhaler   Other Relevant Orders   CBC with Differential/Platelet   CMP14+EGFR   For home use only DME oxygen     COPD exacerbation (HCC)       Relevant Medications   albuterol  (VENTOLIN  HFA) 108 (90 Base) MCG/ACT inhaler   Other Relevant Orders   CBC with Differential/Platelet   CMP14+EGFR   For home use only DME oxygen     Chronic obstructive pulmonary disease with hypoxia (HCC)       Relevant Medications   albuterol  (VENTOLIN  HFA) 108 (90 Base) MCG/ACT inhaler   Other Relevant Orders   For home use only DME oxygen          Hospital-acquired pneumonia with acute hypoxic respiratory failure and delirium (resolved) Symptoms improving with Augmentin  and ciprofloxacin. Currently on home oxygen. - Continue Augmentin  and ciprofloxacin as prescribed. - Monitor for signs of recurrent infection or delirium.  Chronic obstructive pulmonary disease with acute  exacerbation Recent exacerbation treated. Mild wheezing likely residual from pneumonia. - Prescribe albuterol  inhaler for wheezing as needed. - Send albuterol  prescription to [facility]. - Monitor respiratory status and use of albuterol .  Congestive heart failure No current swelling or fluid overload. Adequate urine output. - Monitor for signs of fluid overload. - Recheck blood counts and kidney function.  Portable oxygen concentrator need Current oxygen setup poses risk due to cord length and potential pinching. - Perform oxygen testing to qualify for portable oxygen concentrator. - Arrange for portable oxygen concentrator if criteria met.  Driving ability Driving privileges suspended due to delirium. Awaiting stable mental status for reevaluation. - Reevaluate driving ability in one week after completing antibiotics. - Monitor for any recurrence of delirium or confusion.       On exertion oxygen dipped down to 88% on room air, oxygen went back up to 93% on 2 L nasal cannula   Follow up plan: Return if symptoms worsen or fail to improve.  Counseling provided for all of the vaccine components Orders Placed This Encounter  Procedures   For home use only DME oxygen   Flu vaccine HIGH DOSE PF(Fluzone Trivalent)   CBC with Differential/Platelet   CMP14+EGFR    Fonda Levins, MD [facility] 05/17/2024, 9:32 AM

## 2024-05-17 NOTE — Addendum Note (Signed)
 Addended by: Kaleab Frasier D on: 05/17/2024 01:30 PM   Modules accepted: Orders

## 2024-05-17 NOTE — Transitions of Care (Post Inpatient/ED Visit) (Signed)
   05/17/2024  Name: ARIF AMENDOLA MRN: [id number] DOB: 01/15/1928  Today's TOC FU Call Status: Today's TOC FU Call Status:: Unsuccessful Call (1st Attempt) Unsuccessful Call (1st Attempt) Date: 05/17/24  Attempted to reach the patient regarding the most recent Inpatient/ED visit.  Follow Up Plan: Additional outreach attempts will be made to reach the patient to complete the Transitions of Care (Post Inpatient/ED visit) call.   [name] [facility], BSN RN Care Manager/ Transition of Care [place]/ [facility] [phone]

## 2024-05-17 NOTE — Addendum Note (Signed)
 Addended by: Sonam Wandel D on: 05/17/2024 09:46 AM   Modules accepted: Orders

## 2024-05-18 ENCOUNTER — Telehealth: Payer: Self-pay | Admitting: *Deleted

## 2024-05-18 LAB — CBC WITH DIFFERENTIAL/PLATELET
Basophils Absolute: 0.1 x10E3/uL (ref 0.0–0.2)
Basos: 1 %
EOS (ABSOLUTE): 0.4 x10E3/uL (ref 0.0–0.4)
Eos: 4 %
Hematocrit: 35.7 % — ABNORMAL LOW (ref 37.5–51.0)
Hemoglobin: 11.3 g/dL — ABNORMAL LOW (ref 13.0–17.7)
Immature Grans (Abs): 0.1 x10E3/uL (ref 0.0–0.1)
Immature Granulocytes: 1 %
Lymphocytes Absolute: 0.9 x10E3/uL (ref 0.7–3.1)
Lymphs: 9 %
MCH: 30.1 pg (ref 26.6–33.0)
MCHC: 31.7 g/dL (ref 31.5–35.7)
MCV: 95 fL (ref 79–97)
Monocytes Absolute: 0.7 x10E3/uL (ref 0.1–0.9)
Monocytes: 7 %
Neutrophils Absolute: 8.1 x10E3/uL — ABNORMAL HIGH (ref 1.4–7.0)
Neutrophils: 78 %
Platelets: 259 x10E3/uL (ref 150–450)
RBC: 3.76 x10E6/uL — ABNORMAL LOW (ref 4.14–5.80)
RDW: 13.1 % (ref 11.6–15.4)
WBC: 10.3 x10E3/uL (ref 3.4–10.8)

## 2024-05-18 LAB — CMP14+EGFR
ALT: 24 IU/L (ref 0–44)
AST: 25 IU/L (ref 0–40)
Albumin: 3.4 g/dL — AB (ref 3.6–4.6)
Alkaline Phosphatase: 112 IU/L (ref 48–129)
BUN/Creatinine Ratio: 30 — AB (ref 10–24)
BUN: 21 mg/dL (ref 10–36)
Bilirubin Total: 0.3 mg/dL (ref 0.0–1.2)
CO2: 28 mmol/L (ref 20–29)
Calcium: 9.2 mg/dL (ref 8.6–10.2)
Chloride: 95 mmol/L — ABNORMAL LOW (ref 96–106)
Creatinine, Ser: 0.7 mg/dL — AB (ref 0.76–1.27)
Globulin, Total: 2.9 g/dL (ref 1.5–4.5)
Glucose: 100 mg/dL — ABNORMAL HIGH (ref 70–99)
Potassium: 4.9 mmol/L (ref 3.5–5.2)
Sodium: 135 mmol/L (ref 134–144)
Total Protein: 6.3 g/dL (ref 6.0–8.5)
eGFR: 85 mL/min/1.73 (ref >59)

## 2024-05-18 NOTE — Transitions of Care (Post Inpatient/ED Visit) (Signed)
 05/18/2024  Name: Gregory Pearson MRN: [id number] DOB: August 01, 1928  Today's TOC FU Call Status: Today's TOC FU Call Status:: Successful TOC FU Call Completed TOC FU Call Complete Date: 05/18/24 Patient's Name and Date of Birth confirmed.  Transition Care Management Follow-up Telephone Call Date of Discharge: 05/14/24 Discharge Facility: Other ([facility]) Name of Other ([facility]) Discharge Facility: [facility] Type of Discharge: Inpatient Admission Primary Inpatient Discharge Diagnosis:: Acute on chronic congestive heart failure How have you been since you were released from the hospital?: Better (eating, drinking well, uses walker for ambulating, weighs daily) Any questions or concerns?: No  Items Reviewed: Did you receive and understand the discharge instructions provided?: Yes Medications obtained,verified, and reconciled?: No Medications Not Reviewed Reasons:: Other: (unable to review due to patient's son is not available, son provides oversight for all medications) Any new allergies since your discharge?: No Dietary orders reviewed?: Yes Type of Diet Ordered:: heart healthy,  low sodium Do you have support at home?: Yes People in Home [RPT]: alone Name of Support/Comfort Primary Source: son [name]  DPR  Medications Reviewed Today: Unable to complete medication reconciliation, patient's son not available Medications Reviewed Today   Medications were not reviewed in this encounter     Home Care and Equipment/Supplies: Were Home Health Services Ordered?: No Any new equipment or medical supplies ordered?: No  Functional Questionnaire: Do you need assistance with bathing/showering or dressing?: No Do you need assistance with meal preparation?: No Do you need assistance with eating?: No Do you have difficulty maintaining continence: No Do you need assistance with getting out of bed/getting out of a chair/moving?: Yes (walker) Do you have difficulty  managing or taking your medications?: Yes (son provides oversight)  Follow up appointments reviewed: PCP Follow-up appointment confirmed?: Yes Date of PCP follow-up appointment?: 05/17/24 Follow-up Provider: saw Dr. Maryanne  05/17/24 Specialist Hospital Follow-up appointment confirmed?: Yes Date of Specialist follow-up appointment?: 05/28/24 Follow-Up Specialty Provider:: [name]   cardiology Do you need transportation to your follow-up appointment?: No Do you understand care options if your condition(s) worsen?: Yes-patient verbalized understanding  SDOH Interventions Today    Flowsheet Row Most Recent Value  SDOH Interventions   Food Insecurity Interventions Intervention Not Indicated  Housing Interventions Intervention Not Indicated  Transportation Interventions Intervention Not Indicated  Utilities Interventions Intervention Not Indicated     Goals Addressed             This Visit's Progress    VBCI Transitions of Care (TOC) Care Plan       Problems:  Recent Hospitalization for treatment of CHF Pt weighs daily, son provides oversight for medications, unable to speak with son today, unable to complete medication reconciliation  Goal:  Over the next 30 days, the patient will not experience hospital readmission  Interventions:  Heart Failure Interventions: Basic overview and discussion of pathophysiology of Heart Failure reviewed Provided education on low sodium diet Assessed need for readable accurate scales in home Provided education about placing scale on hard, flat surface Advised patient to weigh each morning after emptying bladder Discussed importance of daily weight and advised patient to weigh and record daily Discussed the importance of keeping all appointments with provider Screening for signs and symptoms of depression related to chronic disease state  Assessed social determinant of health barriers  Reviewed HF action plan Reviewed safety  precautions Reviewed oxygen safety  Patient Self Care Activities:  Attend all scheduled provider appointments Attend church or other social activities Call pharmacy  for medication refills 3-7 days in advance of running out of medications Call provider office for new concerns or questions  Notify RN Care Manager of TOC call rescheduling needs Participate in Transition of Care Program/Attend TOC scheduled calls Take medications as prescribed   call office if I gain more than 2 pounds in one day or 5 pounds in one week keep legs up while sitting track weight in diary use salt in moderation watch for swelling in feet, ankles and legs every day weigh myself daily develop a rescue plan follow rescue plan if symptoms flare-up dress right for the weather, hot or cold  Plan:  Telephone follow up appointment with care management team member scheduled for:  05/26/24 @ 215 pm The patient has been provided with contact information for the care management team and has been advised to call with any health related questions or concerns.         [name] [facility], BSN RN Care Manager/ Transition of Care [place]/ [facility] [phone]

## 2024-05-20 ENCOUNTER — Inpatient Hospital Stay: Admitting: Family Medicine

## 2024-05-24 ENCOUNTER — Ambulatory Visit: Payer: Self-pay | Admitting: Family Medicine

## 2024-05-26 ENCOUNTER — Ambulatory Visit: Payer: Self-pay | Admitting: Internal Medicine

## 2024-05-26 ENCOUNTER — Other Ambulatory Visit: Payer: Self-pay | Admitting: *Deleted

## 2024-05-26 NOTE — Patient Instructions (Signed)
 Visit Information  Thank you for taking time to visit with me today. Please don't hesitate to contact me if I can be of assistance to you before our next scheduled telephone appointment.  Our next appointment is by telephone on 06/02/24 @ 215 pm  Following is a copy of your care plan:   Goals Addressed             This Visit's Progress    VBCI Transitions of Care (TOC) Care Plan       Problems:  Recent Hospitalization for treatment of CHF Pt weighs daily, son provides oversight for medications, spoke with pt and son [name],  no new concerns reported  Goal:  Over the next 30 days, the patient will not experience hospital readmission  Interventions:  Heart Failure Interventions: Basic overview and discussion of pathophysiology of Heart Failure reviewed Discussed importance of daily weight and advised patient to weigh and record daily Discussed the importance of keeping all appointments with provider Assessed social determinant of health barriers  Reinforced HF action plan Reinforced safety precautions Reinforced oxygen safety Reviewed medications with patient's son, importance of taking as prescribed  Patient Self Care Activities:  Attend all scheduled provider appointments Attend church or other social activities Call pharmacy for medication refills 3-7 days in advance of running out of medications Call provider office for new concerns or questions  Notify RN Care Manager of TOC call rescheduling needs Participate in Transition of Care Program/Attend TOC scheduled calls Take medications as prescribed   call office if I gain more than 2 pounds in one day or 5 pounds in one week keep legs up while sitting track weight in diary use salt in moderation watch for swelling in feet, ankles and legs every day weigh myself daily develop a rescue plan follow rescue plan if symptoms flare-up dress right for the weather, hot or cold  Plan:  Telephone follow up appointment with  care management team member scheduled for:  06/02/24 @ 215 pm The patient has been provided with contact information for the care management team and has been advised to call with any health related questions or concerns.         Patient verbalizes understanding of instructions and care plan provided today and agrees to view in MyChart. Active MyChart status and patient understanding of how to access instructions and care plan via MyChart confirmed with patient.     Telephone follow up appointment with care management team member scheduled for: 06/02/24 @ 215 pm  Please call the care guide team at [phone] if you need to cancel or reschedule your appointment.   Please call the Suicide and Crisis Lifeline: 988 call the [place]  National Suicide Prevention Lifeline: 309-691-8976 or TTY: 574-821-5333 TTY (650)201-0677) to talk to a trained counselor call 1-800-273-TALK (toll free, 24 hour hotline) go to [facility] [address] [phone]) call the [facility] Crisis Line: 2264608739 call 911 if you are experiencing a Mental Health or Behavioral Health Crisis or need someone to talk to.  [name] [facility], BSN RN Care Manager/ Transition of Care [employer]/ [facility] [phone]

## 2024-05-26 NOTE — Patient Outreach (Signed)
 Transition of Care week 2  Visit Note  05/26/2024  Name: Gregory Pearson MRN: [id number]          DOB: 1927-10-03  Situation: Patient enrolled in [facility] 30-day program. Visit completed with patient by telephone.   Background:   Past Medical History:  Diagnosis Date   Asbestos exposure    Blood transfusion without reported diagnosis    after ruptured appendix 1963   Diverticulosis    Gastric ulcer 06/2019   GERD (gastroesophageal reflux disease)    Heart murmur    History of stomach ulcers 1995   H.pylori s/p treatment   History of stomach ulcers    Hyperlipidemia    Muscle strain of right gluteal region 01/24/2014   Prostate cancer (HCC) 2002   prostatectomy   Traumatic hematoma of buttock 01/24/2014    Assessment: Patient Reported Symptoms: Cognitive Cognitive Status: No symptoms reported, Alert and oriented to person, place, and time, Normal speech and language skills, Able to follow simple commands      Neurological Neurological Review of Symptoms: No symptoms reported    HEENT HEENT Symptoms Reported: No symptoms reported      Cardiovascular Cardiovascular Symptoms Reported: No symptoms reported Does patient have uncontrolled Hypertension?: No Weight: 196 lb 11.2 oz (89.2 kg) Cardiovascular Self-Management Outcome: 4 (good) Cardiovascular Comment: Reinforced HF action plan, pt weighs daily, on oxygen at 2 liters via [place] continuous  Respiratory Respiratory Symptoms Reported: No symptoms reported Other Respiratory Symptoms: reinforced oxygen safety Respiratory Management Strategies: Oxygen therapy  Endocrine Endocrine Symptoms Reported: No symptoms reported Is patient diabetic?: No    Gastrointestinal Gastrointestinal Symptoms Reported: No symptoms reported      Genitourinary      Integumentary      Musculoskeletal Musculoskelatal Symptoms Reviewed: Unsteady gait Additional Musculoskeletal Details: walker Musculoskeletal Management Strategies: Routine  screening, Medical device Musculoskeletal Self-Management Outcome: 4 (good) Musculoskeletal Comment: reinforced safety precautions      Psychosocial Psychosocial Symptoms Reported: No symptoms reported         There were no vitals filed for this visit.  Medications Reviewed Today     Reviewed by [name] LABOR, RN (Registered Nurse) on 05/26/24 at 1414  Med List Status: <None>   Medication Order Taking? Sig Documenting Provider Last Dose Status Informant  acetaminophen  (TYLENOL ) 500 MG tablet [id number] Yes Take 1 tablet (500 mg total) by mouth every 6 (six) hours as needed. [name] HERO, NP  Active Self, Pharmacy Records  albuterol  (VENTOLIN  HFA) [phone] Base) MCG/ACT inhaler [id number] Yes Inhale 2 puffs into the lungs every 6 (six) hours as needed for wheezing or shortness of breath. Dettinger, Fonda LABOR, MD  Active   ascorbic acid (VITAMIN C) 500 MG tablet [id number] Yes Take 500 mg by mouth daily. [provider]  Active Self, Pharmacy Records  aspirin  81 MG chewable tablet [id number] Yes Chew 1 tablet (81 mg total) by mouth daily with breakfast. Pearlean, Courage, MD  Active   dapagliflozin  propanediol (FARXIGA ) 10 MG TABS tablet [id number] Yes Take 1 tablet (10 mg total) by mouth daily before breakfast. Hayes Beckey CROME, NP  Active   ferrous sulfate  325 (65 FE) MG EC tablet [id number] Yes Take 65 mg by mouth as needed. [provider]  Active   fluticasone  (FLONASE ) 50 MCG/ACT nasal spray [id number] Yes Place 1 spray into both nostrils 2 (two) times daily as needed for allergies or rhinitis. Dettinger, Fonda LABOR, MD  Active Self, Pharmacy Records  magnesium  (MAGTAB) 84 MG (  ) TBCR SR tablet [id number] Yes Take 84 mg by mouth daily. [provider]  Active Self, Pharmacy Records  metoprolol  tartrate (LOPRESSOR ) 25 MG tablet [id number] Yes Take 0.5 tablets (12.5 mg total) by mouth 2 (two) times daily. [name], MD  Active   Multiple Vitamin (MULTIVITAMIN)  tablet [id number] Yes Take 1 tablet by mouth daily. [provider]  Active Self, Pharmacy Records  pantoprazole  (PROTONIX ) 40 MG tablet [id number] Yes Take 1 tablet (40 mg total) by mouth daily. [name], MD  Active   Riboflavin (VITAMIN B-2 PO) [id number] Yes Take 1 tablet by mouth daily. [provider]  Active Self, Pharmacy Records  sertraline  (ZOLOFT ) 100 MG tablet [id number] Yes Take 1 tablet (100 mg total) by mouth daily. Dettinger, Fonda LABOR, MD  Active Self, Pharmacy Records  torsemide  (DEMADEX ) 20 MG tablet [id number] Yes Take 1 tablet (20 mg total) by mouth daily. [name], MD  Active   traMADol DANNY) 50 MG tablet [id number] Yes Take 50 mg by mouth every 6 (six) hours as needed for moderate pain (pain score 4-6). [provider]  Active   traZODone  (DESYREL ) 100 MG tablet [id number] Yes Take 1 tablet (100 mg total) by mouth at bedtime as needed. for sleep Dettinger, Fonda LABOR, MD  Active Self, Pharmacy Records  vitamin B-12 (CYANOCOBALAMIN) 100 MCG tablet [id number] Yes Take 100 mcg by mouth daily. [provider]  Active Self, Pharmacy Records            Goals Addressed             This Visit's Progress    VBCI Transitions of Care (TOC) Care Plan       Problems:  Recent Hospitalization for treatment of CHF Pt weighs daily, son provides oversight for medications, spoke with pt and son [name],  no new concerns reported  Goal:  Over the next 30 days, the patient will not experience hospital readmission  Interventions:  Heart Failure Interventions: Basic overview and discussion of pathophysiology of Heart Failure reviewed Discussed importance of daily weight and advised patient to weigh and record daily Discussed the importance of keeping all appointments with provider Assessed social determinant of health barriers  Reinforced HF action plan Reinforced safety precautions Reinforced oxygen safety Reviewed medications with  patient's son, importance of taking as prescribed  Patient Self Care Activities:  Attend all scheduled provider appointments Attend church or other social activities Call pharmacy for medication refills 3-7 days in advance of running out of medications Call provider office for new concerns or questions  Notify RN Care Manager of TOC call rescheduling needs Participate in Transition of Care Program/Attend TOC scheduled calls Take medications as prescribed   call office if I gain more than 2 pounds in one day or 5 pounds in one week keep legs up while sitting track weight in diary use salt in moderation watch for swelling in feet, ankles and legs every day weigh myself daily develop a rescue plan follow rescue plan if symptoms flare-up dress right for the weather, hot or cold  Plan:  Telephone follow up appointment with care management team member scheduled for:  06/02/24 @ 215 pm The patient has been provided with contact information for the care management team and has been advised to call with any health related questions or concerns.         Recommendation:   PCP Follow-up  Follow Up Plan:   Telephone follow-up    [name] [facility], BSN RN Care  Manager/ Transition of Care [place]/ [facility] [phone]

## 2024-05-28 ENCOUNTER — Encounter: Payer: Self-pay | Admitting: Student

## 2024-05-28 ENCOUNTER — Ambulatory Visit: Attending: Student | Admitting: Student

## 2024-05-28 VITALS — BP 104/60 | HR 75 | Ht 72.0 in | Wt 203.8 lb

## 2024-05-28 DIAGNOSIS — I350 Nonrheumatic aortic (valve) stenosis: Secondary | ICD-10-CM | POA: Diagnosis not present

## 2024-05-28 DIAGNOSIS — I493 Ventricular premature depolarization: Secondary | ICD-10-CM | POA: Diagnosis not present

## 2024-05-28 DIAGNOSIS — Z8719 Personal history of other diseases of the digestive system: Secondary | ICD-10-CM | POA: Diagnosis not present

## 2024-05-28 DIAGNOSIS — I5032 Chronic diastolic (congestive) heart failure: Secondary | ICD-10-CM

## 2024-05-28 NOTE — Progress Notes (Signed)
 Cardiology Office Note    Date:  05/28/2024  ID:  Gregory Pearson, DOB 16-Nov-1927, MRN [id number] Cardiologist: Diannah SHAUNNA Maywood, MD { :  History of Present Illness:    Gregory Pearson is a 89 y.o. male with past medical history of chronic HFpEF, aortic stenosis, PVC's and history of GIB who presents to the office today for hospital follow-up.  He was admitted to [facility] in 03/2024 for acute hypoxic respiratory failure in the setting of an acute CHF exacerbation and severe aortic stenosis. Echocardiogram during admission showed a preserved EF of 60 to 65% with normal RV function but he did have severe aortic valve stenosis with VTI at 0.85 cm and mean gradient at 40.7 mmHg. Responded well to IV Lasix  and was transitioned to Torsemide  20 mg daily at discharge. He had previously experienced issues with anemia and he was started on ASA 81 mg daily along with Protonix  40 mg daily. It was recommend that if his hemoglobin remained stable with no bleeding issues, he may possibly be a candidate for TAVR. Otherwise, would need to involve palliative care. Was noted to have frequent PVC's along with couplets and NSVT during admission and was started on Lopressor  12.5 mg twice daily. An outpatient monitor was recommended to assess for significant arrhythmias.  By review of Care Everywhere, he was admitted to [facility] from 9/17 - 05/14/2024 for recurrent hypoxic respiratory failure in the setting of a COPD exacerbation and CHF exacerbation. Also diagnosed with hospital-acquired pneumonia during admission and treated with antibiotic therapy. He was again discharged on Lopressor  12.5 mg twice daily, Farxiga  10mg  daily and Torsemide  20 mg daily.   In talking with the patient and his son today, he reports overall doing well since his hospital discharge. He is still on 2 L nasal cannula at baseline but sometimes goes without this and feels well. He denies any orthopnea, PND or pitting edema. He has been  wearing compression stockings on a regular basis. No recent chest pain or palpitations. Says he was overall asymptomatic while wearing his heart monitor. He still lives by himself and performs ADL's independently. Prior to his hospitalizations, he enjoyed going dancing several days a week.   Studies Reviewed:   EKG: EKG is not ordered today.  Echocardiogram: 03/2024 IMPRESSIONS     1. Left ventricular ejection fraction, by estimation, is 60 to 65%. The  left ventricle has normal function. Left ventricular endocardial border  not optimally defined to evaluate regional wall motion. Left ventricular  diastolic function could not be  evaluated.   2. Right ventricular systolic function is normal. The right ventricular  size is normal. Tricuspid regurgitation signal is inadequate for assessing  PA pressure.   3. Left atrial size was severely dilated.   4. The mitral valve is abnormal. Trivial mitral valve regurgitation. No  evidence of mitral stenosis. Severe mitral annular calcification.   5. The aortic valve is tricuspid. There is severe calcifcation of the  aortic valve. There is severe thickening of the aortic valve. Aortic valve  regurgitation is mild. Severe aortic valve stenosis. Aortic valve area, by  VTI measures 0.85 cm. Aortic  valve mean gradient measures 40.7 mmHg. Aortic valve Vmax measures 3.94  m/s. DVI is 0.23.   6. Aortic dilatation noted. There is borderline dilatation of the aortic  root, measuring 39 mm.    Event Monitor: 04/2024  Patch Wear Time:  13 days and 23 hours (2025-08-30T10:49:39-0400 to 2025-09-13T10:49:31-0400)   Patient had a  min HR of 56 bpm, max HR of 190 bpm, and avg HR of 82 bpm. Predominant underlying rhythm was Sinus Rhythm. 15 Ventricular Tachycardia runs occurred, the run with the fastest interval lasting 4 beats with a max rate of 190 bpm, the longest  lasting 5 beats with an avg rate of 134 bpm. 145 Supraventricular Tachycardia runs occurred,  the run with the fastest interval lasting 4 beats with a max rate of 188 bpm, the longest lasting 26.9 secs with an avg rate of 107 bpm. Some episodes of  Supraventricular Tachycardia may be possible Atrial Tachycardia with variable block. True duration of Supraventricular Tachycardia difficult to ascertain due to artifact. Isolated SVEs were frequent (6.9%, [id number]), SVE Couplets were rare (<1.0%, 5189),  and SVE Triplets were rare (<1.0%, 924). Isolated VEs were frequent (17.8%, [id number]), VE Couplets were rare (<1.0%, 3658), and VE Triplets were rare (<1.0%, 576). Ventricular Bigeminy and Trigeminy were present.   Physical Exam:   VS:  BP 104/60 (BP Location: Left Arm, Cuff Size: Normal)   Pulse 75   Ht 6' (1.829 m)   Wt 203 lb 12.8 oz (92.4 kg)   SpO2 92% Comment: 2 LPN via [place]  BMI 27.64 kg/m    Wt Readings from Last 3 Encounters:  05/28/24 203 lb 12.8 oz (92.4 kg)  05/26/24 196 lb 11.2 oz (89.2 kg)  05/18/24 193 lb 11.2 oz (87.9 kg)     GEN: Pleasant, elderly male appearing in no acute distress NECK: No JVD; No carotid bruits CARDIAC: RRR, 3/6 SEM along RUSB.  RESPIRATORY:  Clear to auscultation without rales, wheezing or rhonchi  ABDOMEN: Appears non-distended. No obvious abdominal masses. EXTREMITIES: Compression stockings in place. Trace ankle edema bilaterally.  Distal pedal pulses are 2+ bilaterally.   Assessment and Plan:   1. Chronic heart failure with preserved ejection fraction (HFpEF) (HCC) - Echocardiogram in 03/2024 showed a preserved EF of 60 to 65% with normal RV function. His weight has been stable and he only has trace edema on examination today.  - Continue current medical therapy for now with Farxiga  10mg  daily, Lopressor  12.5mg  BID and Torsemide  20mg  daily. Creatinine was stable at 0.70 when checked on 05/17/2024.  2. Severe aortic valve stenosis - He did have severe AS by echocardiogram in 03/2024 with mean gradient at 40.7 mmHg. We reviewed options and he does  wish to be referred [facility] to see if he would be a candidate for TAVR. While he is 89 yo, he is very functional for his age and lives by himself and performs ADL's independently.   3. PVC (premature ventricular contraction) - The preliminary report of his recent monitor showed predominantly normal sinus rhythm with 15 episodes of NSVT with the longest lasting for 5 beats. Also noted to have episodes of SVT with the longest lasting for 26.9 seconds. He did have a 6.9% PAC burden and 17.8% PVC burden. - Continue Lopressor  12.5mg  BID for now. Unable to further titrate given his soft BP at 104/60 during today's visit and they report similar readings when checked at home.   4. History of GI bleed - Diagnosed during admission in 10/2023 and due to gastric ulcer at that time. Repeat EGD in 02/2024 showed that this had healed. Hgb was at 11.3 when checked on 05/17/2024.   Signed, Laymon CHRISTELLA Qua, PA-C

## 2024-05-28 NOTE — Patient Instructions (Signed)
 Medication Instructions:  Your physician recommends that you continue on your current medications as directed. Please refer to the Current Medication list given to you today.  *If you need a refill on your cardiac medications before your next appointment, please call your pharmacy*  Lab Work: NONE   If you have labs (blood work) drawn today and your tests are completely normal, you will receive your results only by: MyChart Message (if you have MyChart) OR A paper copy in the mail If you have any lab test that is abnormal or we need to change your treatment, we will call you to review the results.  Testing/Procedures: NONE   Follow-Up: At [facility], you and your health needs are our priority.  As part of our continuing mission to provide you with exceptional heart care, our providers are all part of one team.  This team includes your primary Cardiologist (physician) and Advanced Practice Providers or APPs (Physician Assistants and Nurse Practitioners) who all work together to provide you with the care you need, when you need it.  Your next appointment:   3 month(s)  Provider:   You may see [name] Mallipeddi, MD or one of the following Advanced Practice Providers on your designated Care Team:   [place], PA-C  [place], [place] Olivia [place]avy, [place]     We recommend signing up for the patient portal called MyChart.  Sign up information is provided on this After Visit Summary.  MyChart is used to connect with patients for Virtual Visits (Telemedicine).  Patients are able to view lab/test results, encounter notes, upcoming appointments, etc.  Non-urgent messages can be sent to your provider as well.   To learn more about what you can do with MyChart, go to [email or web address].   Other Instructions Thank you for choosing [facility]!

## 2024-06-02 ENCOUNTER — Other Ambulatory Visit: Payer: Self-pay | Admitting: *Deleted

## 2024-06-02 NOTE — Patient Instructions (Signed)
 Visit Information  Thank you for taking time to visit with me today. Please don't hesitate to contact me if I can be of assistance to you before our next scheduled telephone appointment.  Our next appointment is by telephone on 06/10/24 @ 930 am  Following is a copy of your care plan:   Goals Addressed             This Visit's Progress    VBCI Transitions of Care (TOC) Care Plan       Problems:  Recent Hospitalization for treatment of CHF Pt weighs daily, son provides oversight for medications, spoke with pt and son [name],  no new concerns reported, pt saw cardiologist on 10/3, has follow up with cardiologist Dr. Verlin on 10/9 for TAVR consult/ review  Goal:  Over the next 30 days, the patient will not experience hospital readmission  Interventions:  Heart Failure Interventions: Basic overview and discussion of pathophysiology of Heart Failure reviewed Discussed importance of daily weight and advised patient to weigh and record daily Discussed the importance of keeping all appointments with provider Assessed social determinant of health barriers  Reviewed HF action plan Reviewed safety precautions Reviewed oxygen safety Reviewed importance of taking medications as prescribed  Patient Self Care Activities:  Attend all scheduled provider appointments Attend church or other social activities Call pharmacy for medication refills 3-7 days in advance of running out of medications Call provider office for new concerns or questions  Notify RN Care Manager of TOC call rescheduling needs Participate in Transition of Care Program/Attend TOC scheduled calls Take medications as prescribed   call office if I gain more than 2 pounds in one day or 5 pounds in one week keep legs up while sitting track weight in diary use salt in moderation watch for swelling in feet, ankles and legs every day weigh myself daily develop a rescue plan follow rescue plan if symptoms flare-up dress  right for the weather, hot or cold  Plan:  Telephone follow up appointment with care management team member scheduled for:  06/10/24 @ 930 am The patient has been provided with contact information for the care management team and has been advised to call with any health related questions or concerns.         Patient verbalizes understanding of instructions and care plan provided today and agrees to view in MyChart. Active MyChart status and patient understanding of how to access instructions and care plan via MyChart confirmed with patient.     Telephone follow up appointment with care management team member scheduled for: 06/10/24 @ 930 am  Please call the care guide team at [phone] if you need to cancel or reschedule your appointment.   Please call the Suicide and Crisis Lifeline: 988 call the [place]  National Suicide Prevention Lifeline: 228-609-5379 or TTY: 7085095014 TTY 601-637-7307) to talk to a trained counselor call 1-800-273-TALK (toll free, 24 hour hotline) go to [facility] [address], [place] [phone]) call the [facility] Crisis Line: (870)578-6833 call 911 if you are experiencing a Mental Health or Behavioral Health Crisis or need someone to talk to.  [name] [facility], BSN RN Care Manager/ Transition of Care [place]/ [facility] [phone]

## 2024-06-02 NOTE — Patient Outreach (Signed)
 Transition of Care week 3  Visit Note  06/02/2024  Name: Gregory Pearson MRN: [id number]          DOB: [birth year]  Situation: Patient enrolled in [facility] 30-day program. Visit completed with patient by telephone.   Background:    Past Medical History:  Diagnosis Date   Asbestos exposure    Blood transfusion without reported diagnosis    after ruptured appendix 1963   Diverticulosis    Gastric ulcer 06/2019   GERD (gastroesophageal reflux disease)    Heart murmur    History of stomach ulcers 1995   H.pylori s/p treatment   History of stomach ulcers    Hyperlipidemia    Muscle strain of right gluteal region 01/24/2014   Pneumonia    Prostate cancer (HCC) 2002   prostatectomy   Traumatic hematoma of buttock 01/24/2014    Assessment: Patient Reported Symptoms: Cognitive Cognitive Status: No symptoms reported, Alert and oriented to person, place, and time, Normal speech and language skills, Able to follow simple commands      Neurological Neurological Review of Symptoms: No symptoms reported    HEENT HEENT Symptoms Reported: No symptoms reported      Cardiovascular Cardiovascular Symptoms Reported: No symptoms reported Does patient have uncontrolled Hypertension?: No Weight: 198 lb (89.8 kg) Cardiovascular Self-Management Outcome: 4 (good) Cardiovascular Comment: Reviewed HF action plan, pt weighs daily, on oxygen at 2 liters via [place] continuous  Respiratory Respiratory Symptoms Reported: No symptoms reported Other Respiratory Symptoms: reviewed energy conservation Respiratory Management Strategies: Oxygen therapy Respiratory Self-Management Outcome: 4 (good)  Endocrine Endocrine Symptoms Reported: No symptoms reported Is patient diabetic?: No    Gastrointestinal Gastrointestinal Symptoms Reported: No symptoms reported      Genitourinary Genitourinary Symptoms Reported: No symptoms reported    Integumentary Integumentary Symptoms Reported: No symptoms reported     Musculoskeletal Musculoskelatal Symptoms Reviewed: Unsteady gait Additional Musculoskeletal Details: uses walker, pt states  getting much stronger Musculoskeletal Management Strategies: Routine screening, Medical device Musculoskeletal Self-Management Outcome: 4 (good) Musculoskeletal Comment: reviewed safety precautions      Psychosocial Psychosocial Symptoms Reported: No symptoms reported         There were no vitals filed for this visit.  Medications Reviewed Today     Reviewed by [name] LABOR, RN (Registered Nurse) on 06/02/24 at 1426  Med List Status: <None>   Medication Order Taking? Sig Documenting Provider Last Dose Status Informant  acetaminophen  (TYLENOL ) 500 MG tablet [id number]  Take 1 tablet (500 mg total) by mouth every 6 (six) hours as needed. [name] HERO, NP  Active Self, Pharmacy Records  albuterol  (VENTOLIN  HFA) [phone] Base) MCG/ACT inhaler [id number]  Inhale 2 puffs into the lungs every 6 (six) hours as needed for wheezing or shortness of breath. Dettinger, Fonda LABOR, MD  Active   ascorbic acid (VITAMIN C) 500 MG tablet [id number]  Take 500 mg by mouth daily. [provider]  Active Self, Pharmacy Records  aspirin  81 MG chewable tablet [id number]  Chew 1 tablet (81 mg total) by mouth daily with breakfast. Pearlean, Courage, MD  Active   dapagliflozin  propanediol (FARXIGA ) 10 MG TABS tablet [id number]  Take 1 tablet (10 mg total) by mouth daily before breakfast. Hayes Beckey CROME, NP  Active   ferrous sulfate  325 (65 FE) MG EC tablet [id number]  Take 65 mg by mouth as needed. [provider]  Active   fluticasone  (FLONASE ) 50 MCG/ACT nasal spray [id number]  Place 1 spray into both nostrils 2 (  two) times daily as needed for allergies or rhinitis. Dettinger, Fonda LABOR, MD  Active Self, Pharmacy Records  magnesium  (MAGTAB) 84 MG ( ) TBCR SR tablet [id number]  Take 84 mg by mouth daily. [provider]  Active Self, Pharmacy Records  metoprolol   tartrate (LOPRESSOR ) 25 MG tablet [id number]  Take 0.5 tablets (12.5 mg total) by mouth 2 (two) times daily. [name], MD  Active   Multiple Vitamin (MULTIVITAMIN) tablet [id number]  Take 1 tablet by mouth daily. [provider]  Active Self, Pharmacy Records  pantoprazole  (PROTONIX ) 40 MG tablet [id number]  Take 1 tablet (40 mg total) by mouth daily. [name], MD  Active   Riboflavin (VITAMIN B-2 PO) [id number]  Take 1 tablet by mouth daily. [provider]  Active Self, Pharmacy Records  sertraline  (ZOLOFT ) 100 MG tablet [id number]  Take 1 tablet (100 mg total) by mouth daily. Dettinger, Fonda LABOR, MD  Active Self, Pharmacy Records  torsemide  (DEMADEX ) 20 MG tablet [id number]  Take 1 tablet (20 mg total) by mouth daily. [name], MD  Active   traMADol (ULTRAM) 50 MG tablet [id number]  Take 50 mg by mouth every 6 (six) hours as needed for moderate pain (pain score 4-6). [provider]  Active   traZODone  (DESYREL ) 100 MG tablet [id number]  Take 1 tablet (100 mg total) by mouth at bedtime as needed. for sleep Dettinger, Fonda LABOR, MD  Active Self, Pharmacy Records  vitamin B-12 (CYANOCOBALAMIN) 100 MCG tablet [id number]  Take 100 mcg by mouth daily. [provider]  Active Self, Pharmacy Records            Goals Addressed             This Visit's Progress    [facility] Transitions of Care (TOC) Care Plan       Problems:  Recent Hospitalization for treatment of CHF Pt weighs daily, son provides oversight for medications, spoke with pt and son [name],  no new concerns reported, pt saw cardiologist on 10/3, has follow up with cardiologist Dr. Verlin on 10/9 for TAVR consult/ review  Goal:  Over the next 30 days, the patient will not experience hospital readmission  Interventions:  Heart Failure Interventions: Basic overview and discussion of pathophysiology of Heart Failure reviewed Discussed importance of daily weight and advised patient to  weigh and record daily Discussed the importance of keeping all appointments with provider Assessed social determinant of health barriers  Reviewed HF action plan Reviewed safety precautions Reviewed oxygen safety Reviewed importance of taking medications as prescribed  Patient Self Care Activities:  Attend all scheduled provider appointments Attend church or other social activities Call pharmacy for medication refills 3-7 days in advance of running out of medications Call provider office for new concerns or questions  Notify RN Care Manager of TOC call rescheduling needs Participate in Transition of Care Program/Attend TOC scheduled calls Take medications as prescribed   call office if I gain more than 2 pounds in one day or 5 pounds in one week keep legs up while sitting track weight in diary use salt in moderation watch for swelling in feet, ankles and legs every day weigh myself daily develop a rescue plan follow rescue plan if symptoms flare-up dress right for the weather, hot or cold  Plan:  Telephone follow up appointment with care management team member scheduled for:  06/10/24 @ 930 am The patient has been provided with contact information for the care management team and has been advised to  call with any health related questions or concerns.          Recommendation:   PCP Follow-up Specialty provider follow-up cardiology 06/03/24  Follow Up Plan:   Telephone follow-up 06/10/24 @ 930 am  [name] [facility], BSN RN Care Manager/ Transition of Care [place]/ [facility] [phone]

## 2024-06-03 ENCOUNTER — Encounter: Payer: Self-pay | Admitting: Cardiovascular Disease

## 2024-06-03 ENCOUNTER — Ambulatory Visit: Attending: Cardiovascular Disease | Admitting: Cardiovascular Disease

## 2024-06-03 VITALS — BP 123/69 | HR 81 | Ht 72.0 in | Wt 199.0 lb

## 2024-06-03 DIAGNOSIS — I350 Nonrheumatic aortic (valve) stenosis: Secondary | ICD-10-CM

## 2024-06-03 NOTE — Patient Instructions (Addendum)
 Medication Instructions:  Your physician recommends that you continue on your current medications as directed. Please refer to the Current Medication list given to you today.  *If you need a refill on your cardiac medications before your next appointment, please call your pharmacy*  Lab Work: TODAY-BMET & CBC If you have labs (blood work) drawn today and your tests are completely normal, you will receive your results only by: MyChart Message (if you have MyChart) OR A paper copy in the mail If you have any lab test that is abnormal or we need to change your treatment, we will call you to review the results.  Testing/Procedures: Your physician has requested that you have a cardiac catheterization. Cardiac catheterization is used to diagnose and/or treat various heart conditions. Doctors may recommend this procedure for a number of different reasons. The most common reason is to evaluate chest pain. Chest pain can be a symptom of coronary artery disease (CAD), and cardiac catheterization can show whether plaque is narrowing or blocking your heart's arteries. This procedure is also used to evaluate the valves, as well as measure the blood flow and oxygen levels in different parts of your heart. For further information please visit [email or web address] Please follow instruction sheet, as given.  Follow-Up: At [facility], you and your health needs are our priority.  As part of our continuing mission to provide you with exceptional heart care, our providers are all part of one team.  This team includes your primary Cardiologist (physician) and Advanced Practice Providers or APPs (Physician Assistants and Nurse Practitioners) who all work together to provide you with the care you need, when you need it.   We recommend signing up for the patient portal called MyChart.  Sign up information is provided on this After Visit Summary.  MyChart is used to connect with patients for Virtual Visits  (Telemedicine).  Patients are able to view lab/test results, encounter notes, upcoming appointments, etc.  Non-urgent messages can be sent to your provider as well.   To learn more about what you can do with MyChart, go to [email or web address].   Other Instructions         Cardiac Catheterization   You are scheduled for a Cardiac Catheterization on Thursday, October 16 with Dr. Lonni Cash.  1. Please arrive at the [facility] (Main Entrance A) at [facility]: [address] [place], [place] [address] at 10:00 AM (This time is 2 hour(s) before your procedure to ensure your preparation).   Free valet parking service is available. You will check in at ADMITTING. The support person will be asked to wait in the waiting room.  It is OK to have someone drop you off and come back when you are ready to be discharged.        Special note: Every effort is made to have your procedure done on time. Please understand that emergencies sometimes delay scheduled procedures.  2. Diet: Light meals may be eaten up to 6 hours before scheduled procedures from 12N and after; please stop eating at 6:00 AM   Light meal consist of plain toast, fruit, light soups, crackers.  3. Hydration:On October 16, you may drink approved liquids (see below) until 2 hours before the procedure with 8 oz of water  as your last intake.   List of approved liquids water , clear juice, clear tea, black coffee, fruit juices, non-citric and without pulp, carbonated beverages, Gatorade, Kool -Aid, plain Jello-O and plain ice popsicles.  4. Labs: You will  need to have blood drawn on Thursday, October 9 at [facility] (1st Floor), [address] [place], [place] [address]. You do not need to be fasting.  5. Medication instructions in preparation for your procedure:   Contrast Allergy: No  HOLD FARXIGA  (DAPAGLIFLOZIN ) 3 DAYS PRIOR TO PROCEDURE (06/06/2024 DO NOT TAKE  ANY MORE AFTER SUNDAY DOSE)  On the morning of your procedure, take Aspirin  81 mg and any morning medicines NOT listed above.  You may use sips of water .  6. Plan to go home the same day, you will only stay overnight if medically necessary. 7. You MUST have a responsible adult to drive you home. 8. An adult MUST be with you the first 24 hours after you arrive home. 9. Bring a current list of your medications, and the last time and date medication taken. 10. Bring ID and current insurance cards. 11.Please wear clothes that are easy to get on and off and wear slip-on shoes.  Thank you for allowing us  to care for you!   -- [facility]

## 2024-06-03 NOTE — H&P (View-Only) (Signed)
 [facility] Consult Note  Chief Complaint  Patient presents with   New Patient (Initial Visit)    Severe aortic stenosis   History of Present Illness: 89 yo male with history of chronic diastolic CHF, PVCs, COPD, GERD, prior GI bleeding, HLD, prostate cancer and severe aortic stenosis who is here today as a new consult, referred by Dr. Mallipeddi, for further discussion regarding his aortic stenosis and possible TAVR. He was admitted to [facility] in August 2025 with acute hypoxic respiratory failure and was found to have acute CHF. Echo August 2025 with LVEF=60-65%. Normal RV function. Severe aortic stenosis with mean gradient 41 mmHg, AVA 0.78 cm2, DI 0.27, SVI 37. He was diuresed with torsemide . He was admitted to [facility] on 05/12/24 with recurrent hypoxic respiratory failure in setting of COPD exacerbation, acute CHF and was also found to have pneumonia. He was seen in our [facility] office on 05/28/24 and reported feeling well overall while wearing supplemental O2. No bleeding issues reported on ASA.   He tells me today that he feels well overall. He is active during the day. He has baseline dyspnea with exertion. No chest pain, dizziness, near syncope, syncope or LE edema. He has been taking torsemide  20 mg daily. He lives in [place], [place] alone. He is retired from [employer]. He has full dentures. He is here today with his son.   Primary Care Physician: Dettinger, Fonda LABOR, MD Primary Cardiologist: Mallipeddi Referring Cardiologist: Mallipeddi  Past Medical History:  Diagnosis Date   Aortic stenosis    Asbestos exposure    Blood transfusion without reported diagnosis    after ruptured appendix 1963   Diverticulosis    Gastric ulcer 06/2019   GERD (gastroesophageal reflux disease)    History of stomach ulcers 1995   H.pylori s/p treatment   History of stomach ulcers    Hyperlipidemia    Muscle strain of right gluteal region  01/24/2014   Pneumonia    Prostate cancer (HCC) 2002   prostatectomy   Traumatic hematoma of buttock 01/24/2014    Past Surgical History:  Procedure Laterality Date   APPENDECTOMY  1963   ruptured appendix/gangrene   BIOPSY  07/06/2019   Procedure: BIOPSY;  Surgeon: Shaaron Lamar HERO, MD;  Location: [facility];  Service: Endoscopy;;   CATARACT EXTRACTION W/PHACO Left 04/04/2023   Procedure: CATARACT EXTRACTION PHACO AND INTRAOCULAR LENS PLACEMENT (IOC);  Surgeon: Juli Blunt, MD;  Location: [facility] ORS;  Service: Ophthalmology;  Laterality: Left;  CDE  5.20   CATARACT EXTRACTION W/PHACO Right 04/18/2023   Procedure: CATARACT EXTRACTION PHACO AND INTRAOCULAR LENS PLACEMENT (IOC);  Surgeon: Juli Blunt, MD;  Location: [facility] OR[facility];  Service: Ophthalmology;  Laterality: Right;  CDE 12.07   COLONOSCOPY  2013   Dr. Aneita: multiple colon polyps (tubular adenomas), diverticulosis. no further surveillance colonoscopies due to age.   ESOPHAGOGASTRODUODENOSCOPY  8004,8004   Dr. Aneita: gastric ulcer (h.pylori + s/p tx), follow up EGD verified ulcer healing   ESOPHAGOGASTRODUODENOSCOPY N/A 09/26/2016   Procedure: ESOPHAGOGASTRODUODENOSCOPY (EGD);  Surgeon: Lamar HERO Shaaron, MD;  [facility];  Service: Endoscopy;  Laterality: N/A;   ESOPHAGOGASTRODUODENOSCOPY N/A 01/23/2017   Procedure: ESOPHAGOGASTRODUODENOSCOPY (EGD);  Surgeon: Shaaron Lamar HERO, MD;  [facility];  Service: Endoscopy;  Laterality: N/A;  12:15pm   ESOPHAGOGASTRODUODENOSCOPY N/A 07/06/2019   Procedure: ESOPHAGOGASTRODUODENOSCOPY (EGD);  Surgeon: Shaaron Lamar HERO, MD; normal esophagus, nonbleeding gastric ulcer with pigmented material, abnormal gastric mucosa of  uncertain significance s/p biopsy, normal duodenum.  Pathology with mild reactive gastropathy, mild chronic gastritis, H. pylori negative.   ESOPHAGOGASTRODUODENOSCOPY N/A 10/20/2019   Procedure: ESOPHAGOGASTRODUODENOSCOPY (EGD);  Surgeon: Shaaron Lamar HERO,  MD;  [facility];  Service: Endoscopy;  Laterality: N/A;  9:30am   ESOPHAGOGASTRODUODENOSCOPY N/A 11/19/2023   Surgeon: Shaaron Lamar HERO, MD; 8 mm satellite shaped gastric ulcer.  Gastric biopsies negative for H. pylori   ESOPHAGOGASTRODUODENOSCOPY N/A 03/10/2024   Procedure: EGD (ESOPHAGOGASTRODUODENOSCOPY);  Surgeon: Shaaron Lamar HERO, MD;  [facility];  Service: Endoscopy;  Laterality: N/A;  2:00 pm, asa 3   HEMORRHOID SURGERY  1970   LAPAROSCOPIC CHOLECYSTECTOMY  2003   PROSTATECTOMY  2002    Current Outpatient Medications  Medication Sig Dispense Refill   acetaminophen  (TYLENOL ) 500 MG tablet Take 1 tablet (500 mg total) by mouth every 6 (six) hours as needed. 30 tablet 0   albuterol  (VENTOLIN  HFA) 108 (90 Base) MCG/ACT inhaler Inhale 2 puffs into the lungs every 6 (six) hours as needed for wheezing or shortness of breath. 8 g 1   ascorbic acid (VITAMIN C) 500 MG tablet Take 500 mg by mouth daily.     aspirin  81 MG chewable tablet Chew 1 tablet (81 mg total) by mouth daily with breakfast. 30 tablet 5   dapagliflozin  propanediol (FARXIGA ) 10 MG TABS tablet Take 1 tablet (10 mg total) by mouth daily before breakfast. 90 tablet 3   ferrous sulfate  325 (65 FE) MG EC tablet Take 65 mg by mouth as needed.     fluticasone  (FLONASE ) 50 MCG/ACT nasal spray Place 1 spray into both nostrils 2 (two) times daily as needed for allergies or rhinitis. 16 g 6   magnesium  (MAGTAB) 84 MG ( ) TBCR SR tablet Take 84 mg by mouth daily.     metoprolol  tartrate (LOPRESSOR ) 25 MG tablet Take 0.5 tablets (12.5 mg total) by mouth 2 (two) times daily. 30 tablet 5   Multiple Vitamin (MULTIVITAMIN) tablet Take 1 tablet by mouth daily.     pantoprazole  (PROTONIX ) 40 MG tablet Take 1 tablet (40 mg total) by mouth daily. 30 tablet 5   Riboflavin (VITAMIN B-2 PO) Take 1 tablet by mouth daily.     sertraline  (ZOLOFT ) 100 MG tablet Take 1 tablet (100 mg total) by mouth daily. 90 tablet 1   torsemide   (DEMADEX ) 20 MG tablet Take 1 tablet (20 mg total) by mouth daily. 30 tablet 5   traMADol (ULTRAM) 50 MG tablet Take 50 mg by mouth every 6 (six) hours as needed for moderate pain (pain score 4-6).     traZODone  (DESYREL ) 100 MG tablet Take 1 tablet (100 mg total) by mouth at bedtime as needed. for sleep 90 tablet 3   vitamin B-12 (CYANOCOBALAMIN) 100 MCG tablet Take 100 mcg by mouth daily.     No current facility-administered medications for this visit.    Allergies  Allergen Reactions   Nsaids Other (See Comments)    GI bleed, PUD    Social History   Socioeconomic History   Marital status: Widowed    Spouse name: Not on file   Number of children: 3   Years of education: Not on file   Highest education level: 12th grade  Occupational History   Occupation: retired Psychiatric nurse power  Tobacco Use   Smoking status: Never   Smokeless tobacco: Never  Vaping Use   Vaping status: Never Used  Substance and Sexual Activity   Alcohol use: Not  Currently    Comment: last drank about 15 years ago   Drug use: Never   Sexual activity: Not on file  Other Topics Concern   Not on file  Social History Narrative   3 children.   Widowed since 01/16/2021. Married x 61 years.    Social Drivers of Corporate investment banker Strain: Low Risk  (05/12/2024)   Received from [facility]   Overall Financial Resource Strain (CARDIA)    How hard is it for you to pay for the very basics like food, housing, medical care, and heating?: Not hard at all  Food Insecurity: No Food Insecurity (05/18/2024)   Hunger Vital Sign    Worried About Running Out of Food in the Last Year: Never true    Ran Out of Food in the Last Year: Never true  Transportation Needs: No Transportation Needs (05/18/2024)   PRAPARE - Administrator, Civil Service (Medical): No    Lack of Transportation (Non-Medical): No  Physical Activity: Inactive (05/12/2024)   Received from [facility]   Exercise Vital Sign    On  average, how many days per week do you engage in moderate to strenuous exercise (like a brisk walk)?: 0 days    On average, how many minutes do you engage in exercise at this level?: 0 min  Stress: No Stress Concern Present (05/12/2024)   Received from [facility] - Occupational Stress Questionnaire    Do you feel stress - tense, restless, nervous, or anxious, or unable to sleep at night because your mind is troubled all the time - these days?: Not at all  Social Connections: Moderately Integrated (05/12/2024)   Received from [facility]   Social Connection and Isolation Panel    In a typical week, how many times do you talk on the phone with family, friends, or neighbors?: More than three times a week    How often do you get together with friends or relatives?: More than three times a week    How often do you attend church or religious services?: More than 4 times per year    Do you belong to any clubs or organizations such as church groups, unions, fraternal or athletic groups, or school groups?: No    How often do you attend meetings of the clubs or organizations you belong to?: More than 4 times per year    Are you married, widowed, divorced, separated, never married, or living with a partner?: Widowed  Intimate Partner Violence: Not At Risk (05/18/2024)   Humiliation, Afraid, Rape, and Kick questionnaire    Fear of Current or Ex-Partner: No    Emotionally Abused: No    Physically Abused: No    Sexually Abused: No    Family History  Problem Relation Age of Onset   Prostate cancer Brother    Prostate cancer Brother    Suicidality Brother    Prostate cancer Brother    Stomach cancer Neg Hx    Colon cancer Neg Hx     Review of Systems:  As stated in the HPI and otherwise negative.   BP 123/69 (BP Location: Left Arm, Patient Position: Sitting, Cuff Size: Normal)   Pulse 81   Ht 6' (1.829 m)   Wt 199 lb (90.3 kg)   SpO2 91%   BMI  26.99 kg/m   Physical Examination: General: Well developed, well nourished, NAD  HEENT: OP clear,  mucus membranes moist  SKIN: warm, dry. No rashes. Neuro: No focal deficits  Musculoskeletal: Muscle strength 5/5 all ext  Psychiatric: Mood and affect normal  Neck: No JVD, no carotid bruits, no thyromegaly, no lymphadenopathy.  Lungs:Clear bilaterally, no wheezes, rhonci, crackles Cardiovascular: Regular rate and rhythm. Loud, harsh, late peaking systolic murmur.  Abdomen:Soft. Bowel sounds present. Non-tender.  Extremities: No lower extremity edema. Pulses are 2 + in the bilateral DP/PT.  EKG:  EKG is ordered today. The ekg ordered today demonstrates  EKG Interpretation Date/Time:  Thursday June 03 2024 14:05:17 EDT Ventricular Rate:  67 PR Interval:  170 QRS Duration:  94 QT Interval:  412 QTC Calculation: 435 R Axis:   26  Text Interpretation: Normal sinus rhythm Normal ECG Confirmed by Verlin Bruckner [phone]) on 06/03/2024 2:09:05 PM   Echo 04/13/24:  1. Left ventricular ejection fraction, by estimation, is 60 to 65%. The  left ventricle has normal function. Left ventricular endocardial border  not optimally defined to evaluate regional wall motion. Left ventricular  diastolic function could not be  evaluated.   2. Right ventricular systolic function is normal. The right ventricular  size is normal. Tricuspid regurgitation signal is inadequate for assessing  PA pressure.   3. Left atrial size was severely dilated.   4. The mitral valve is abnormal. Trivial mitral valve regurgitation. No  evidence of mitral stenosis. Severe mitral annular calcification.   5. The aortic valve is tricuspid. There is severe calcifcation of the  aortic valve. There is severe thickening of the aortic valve. Aortic valve  regurgitation is mild. Severe aortic valve stenosis. Aortic valve area, by  VTI measures 0.85 cm. Aortic  valve mean gradient measures 40.7 mmHg. Aortic valve Vmax  measures 3.94  m/s. DVI is 0.23.   6. Aortic dilatation noted. There is borderline dilatation of the aortic  root, measuring 39 mm.   FINDINGS   Left Ventricle: Left ventricular ejection fraction, by estimation, is 60  to 65%. The left ventricle has normal function. Left ventricular  endocardial border not optimally defined to evaluate regional wall motion.  Strain was performed and the global  longitudinal strain is indeterminate. The left ventricular internal cavity  size was normal in size. There is no left ventricular hypertrophy. Left  ventricular diastolic function could not be evaluated due to mitral  annular calcification (moderate or  greater). Left ventricular diastolic function could not be evaluated.   Right Ventricle: The right ventricular size is normal. No increase in  right ventricular wall thickness. Right ventricular systolic function is  normal. Tricuspid regurgitation signal is inadequate for assessing PA  pressure.   Left Atrium: Left atrial size was severely dilated.   Right Atrium: Right atrial size was normal in size.   Pericardium: There is no evidence of pericardial effusion.   Mitral Valve: The mitral valve is abnormal. Severe mitral annular  calcification. Trivial mitral valve regurgitation. No evidence of mitral  valve stenosis.   Tricuspid Valve: The tricuspid valve is normal in structure. Tricuspid  valve regurgitation is mild . No evidence of tricuspid stenosis.   Aortic Valve: The aortic valve is tricuspid. There is severe calcifcation  of the aortic valve. There is severe thickening of the aortic valve.  Aortic valve regurgitation is mild. Severe aortic stenosis is present.  Aortic valve mean gradient measures 40.7   mmHg. Aortic valve peak gradient measures 62.2 mmHg. Aortic valve area,  by VTI measures 0.85 cm.   Pulmonic Valve: The pulmonic valve  was not well visualized. Pulmonic valve  regurgitation is trivial. No evidence of pulmonic  stenosis.   Aorta: Aortic dilatation noted. There is borderline dilatation of the  aortic root, measuring 39 mm.   Venous: The inferior vena cava was not well visualized.   IAS/Shunts: The interatrial septum was not well visualized.   Additional Comments: 3D was performed not requiring image post processing  on an independent workstation and was indeterminate.     LEFT VENTRICLE  PLAX 2D  LVIDd:         5.05 cm   Diastology  LVIDs:         2.90 cm   LV e' medial:    6.53 cm/s  LV PW:         1.00 cm   LV E/e' medial:  21.1  LV IVS:        1.10 cm   LV e' lateral:   10.70 cm/s  LVOT diam:     2.00 cm   LV E/e' lateral: 12.9  LV SV:         79  LV SV Index:   37  LVOT Area:     3.14 cm     RIGHT VENTRICLE  RV S prime:     14.00 cm/s  TAPSE (M-mode): 2.6 cm   LEFT ATRIUM              Index        RIGHT ATRIUM           Index  LA diam:        4.40 cm  2.07 cm/m   RA Area:     21.20 cm  LA Vol (A2C):   124.0 ml 58.37 ml/m  RA Volume:   58.40 ml  27.49 ml/m  LA Vol (A4C):   99.6 ml  46.88 ml/m  LA Biplane Vol: 112.0 ml 52.72 ml/m   AORTIC VALVE  AV Area (Vmax):    0.83 cm  AV Area (Vmean):   0.78 cm  AV Area (VTI):     0.85 cm  AV Vmax:           394.33 cm/s  AV Vmean:          308.333 cm/s  AV VTI:            0.933 m  AV Peak Grad:      62.2 mmHg  AV Mean Grad:      40.7 mmHg  LVOT Vmax:         103.87 cm/s  LVOT Vmean:        76.467 cm/s  LVOT VTI:          0.251 m  LVOT/AV VTI ratio: 0.27    AORTA  Ao Root diam: 3.90 cm   MITRAL VALVE  MV Area (PHT): 4.80 cm     SHUNTS  MV Decel Time: 158 msec     Systemic VTI:  0.25 m  MV E velocity: 138.00 cm/s  Systemic Diam: 2.00 cm  MV A velocity: 96.40 cm/s  MV E/A ratio:  1.43   Recent Labs: 04/12/2024: Magnesium  2.3; TSH 0.968 04/21/2024: B Natriuretic Peptide 543.7 05/17/2024: ALT 24; BUN 21; Creatinine, Ser 0.70; Hemoglobin 11.3; Platelets 259; Potassium 4.9; Sodium 135   Lipid Panel    Component Value  Date/Time   CHOL 165 10/08/2023 1355   TRIG 95 10/08/2023 1355   HDL 64 10/08/2023 1355   CHOLHDL 2.6 10/08/2023 1355  LDLCALC 84 10/08/2023 1355    Wt Readings from Last 3 Encounters:  06/03/24 199 lb (90.3 kg)  06/02/24 198 lb (89.8 kg)  05/28/24 203 lb 12.8 oz (92.4 kg)    Assessment and Plan:   1. Severe Aortic Valve Stenosis: He has severe, stage D aortic valve stenosis. NYHA class 2 symptoms. I have personally reviewed the echo images. The aortic valve is thickened and calcified with limited leaflet mobility. I think he would benefit from AVR. Given advanced age, he is not a good candidate for conventional AVR by surgical approach. I think he may be a good candidate for TAVR.   I have reviewed the natural history of aortic stenosis with the patient and their family members  who are present today. We have discussed the limitations of medical therapy and the poor prognosis associated with symptomatic aortic stenosis. We have reviewed potential treatment options, including palliative medical therapy, conventional surgical aortic valve replacement, and transcatheter aortic valve replacement. We discussed treatment options in the context of the patient's specific comorbid medical conditions.   He would like to proceed with planning for TAVR. I will arrange a right and left heart catheterization at [facility] 06/10/24 at noon. Risks and benefits of the cath procedure and the valve procedure are reviewed with the patient. After the cath, he will have a cardiac CT, CTA of the chest/abdomen and pelvis and will then be referred to see one of the CT surgeons on our TAVR team.     Labs/ tests ordered today include:  Orders Placed This Encounter  Procedures   Basic Metabolic Panel (BMET)   CBC   EKG 12-Lead   Disposition:   F/U will be arranged with the structural team  Signed, Lonni Cash, MD, [facility] 06/03/2024 2:26 PM    [facility] [address] [place], [place],  [place]  [address] Phone: [phone]; Fax: [phone]

## 2024-06-03 NOTE — Progress Notes (Signed)
 [facility] Consult Note  Chief Complaint  Patient presents with   New Patient (Initial Visit)    Severe aortic stenosis   History of Present Illness: 89 yo male with history of chronic diastolic CHF, PVCs, COPD, GERD, prior GI bleeding, HLD, prostate cancer and severe aortic stenosis who is here today as a new consult, referred by Dr. Mallipeddi, for further discussion regarding his aortic stenosis and possible TAVR. He was admitted to [facility] in August 2025 with acute hypoxic respiratory failure and was found to have acute CHF. Echo August 2025 with LVEF=60-65%. Normal RV function. Severe aortic stenosis with mean gradient 41 mmHg, AVA 0.78 cm2, DI 0.27, SVI 37. He was diuresed with torsemide . He was admitted to [facility] on 05/12/24 with recurrent hypoxic respiratory failure in setting of COPD exacerbation, acute CHF and was also found to have pneumonia. He was seen in our [facility] office on 05/28/24 and reported feeling well overall while wearing supplemental O2. No bleeding issues reported on ASA.   He tells me today that he feels well overall. He is active during the day. He has baseline dyspnea with exertion. No chest pain, dizziness, near syncope, syncope or LE edema. He has been taking torsemide  20 mg daily. He lives in [place], [place] alone. He is retired from [employer]. He has full dentures. He is here today with his son.   Primary Care Physician: Dettinger, Fonda LABOR, MD Primary Cardiologist: Mallipeddi Referring Cardiologist: Mallipeddi  Past Medical History:  Diagnosis Date   Aortic stenosis    Asbestos exposure    Blood transfusion without reported diagnosis    after ruptured appendix 1963   Diverticulosis    Gastric ulcer 06/2019   GERD (gastroesophageal reflux disease)    History of stomach ulcers 1995   H.pylori s/p treatment   History of stomach ulcers    Hyperlipidemia    Muscle strain of right gluteal region  01/24/2014   Pneumonia    Prostate cancer (HCC) 2002   prostatectomy   Traumatic hematoma of buttock 01/24/2014    Past Surgical History:  Procedure Laterality Date   APPENDECTOMY  1963   ruptured appendix/gangrene   BIOPSY  07/06/2019   Procedure: BIOPSY;  Surgeon: Shaaron Lamar HERO, MD;  Location: [facility];  Service: Endoscopy;;   CATARACT EXTRACTION W/PHACO Left 04/04/2023   Procedure: CATARACT EXTRACTION PHACO AND INTRAOCULAR LENS PLACEMENT (IOC);  Surgeon: Juli Blunt, MD;  Location: [facility] ORS;  Service: Ophthalmology;  Laterality: Left;  CDE  5.20   CATARACT EXTRACTION W/PHACO Right 04/18/2023   Procedure: CATARACT EXTRACTION PHACO AND INTRAOCULAR LENS PLACEMENT (IOC);  Surgeon: Juli Blunt, MD;  Location: [facility] ORS;  Service: Ophthalmology;  Laterality: Right;  CDE 12.07   COLONOSCOPY  2013   Dr. Aneita: multiple colon polyps (tubular adenomas), diverticulosis. no further surveillance colonoscopies due to age.   ESOPHAGOGASTRODUODENOSCOPY  8004,8004   Dr. Aneita: gastric ulcer (h.pylori + s/p tx), follow up EGD verified ulcer healing   ESOPHAGOGASTRODUODENOSCOPY N/A 09/26/2016   Procedure: ESOPHAGOGASTRODUODENOSCOPY (EGD);  Surgeon: Lamar HERO Shaaron, MD;  Location: [facility];  Service: Endoscopy;  Laterality: N/A;   ESOPHAGOGASTRODUODENOSCOPY N/A 01/23/2017   Procedure: ESOPHAGOGASTRODUODENOSCOPY (EGD);  Surgeon: Shaaron Lamar HERO, MD;  Location: [facility];  Service: Endoscopy;  Laterality: N/A;  12:15pm   ESOPHAGOGASTRODUODENOSCOPY N/A 07/06/2019   Procedure: ESOPHAGOGASTRODUODENOSCOPY (EGD);  Surgeon: Shaaron Lamar HERO, MD; normal esophagus, nonbleeding gastric ulcer with pigmented material, abnormal gastric mucosa of  uncertain significance s/p biopsy, normal duodenum.  Pathology with mild reactive gastropathy, mild chronic gastritis, H. pylori negative.   ESOPHAGOGASTRODUODENOSCOPY N/A 10/20/2019   Procedure: ESOPHAGOGASTRODUODENOSCOPY (EGD);  Surgeon: Shaaron Lamar HERO,  MD;  [facility];  Service: Endoscopy;  Laterality: N/A;  9:30am   ESOPHAGOGASTRODUODENOSCOPY N/A 11/19/2023   Surgeon: Shaaron Lamar HERO, MD; 8 mm satellite shaped gastric ulcer.  Gastric biopsies negative for H. pylori   ESOPHAGOGASTRODUODENOSCOPY N/A 03/10/2024   Procedure: EGD (ESOPHAGOGASTRODUODENOSCOPY);  Surgeon: Shaaron Lamar HERO, MD;  [facility];  Service: Endoscopy;  Laterality: N/A;  2:00 pm, asa 3   HEMORRHOID SURGERY  1970   LAPAROSCOPIC CHOLECYSTECTOMY  2003   PROSTATECTOMY  2002    Current Outpatient Medications  Medication Sig Dispense Refill   acetaminophen  (TYLENOL ) 500 MG tablet Take 1 tablet (500 mg total) by mouth every 6 (six) hours as needed. 30 tablet 0   albuterol  (VENTOLIN  HFA) 108 (90 Base) MCG/ACT inhaler Inhale 2 puffs into the lungs every 6 (six) hours as needed for wheezing or shortness of breath. 8 g 1   ascorbic acid (VITAMIN C) 500 MG tablet Take 500 mg by mouth daily.     aspirin  81 MG chewable tablet Chew 1 tablet (81 mg total) by mouth daily with breakfast. 30 tablet 5   dapagliflozin  propanediol (FARXIGA ) 10 MG TABS tablet Take 1 tablet (10 mg total) by mouth daily before breakfast. 90 tablet 3   ferrous sulfate  325 (65 FE) MG EC tablet Take 65 mg by mouth as needed.     fluticasone  (FLONASE ) 50 MCG/ACT nasal spray Place 1 spray into both nostrils 2 (two) times daily as needed for allergies or rhinitis. 16 g 6   magnesium  (MAGTAB) 84 MG ( ) TBCR SR tablet Take 84 mg by mouth daily.     metoprolol  tartrate (LOPRESSOR ) 25 MG tablet Take 0.5 tablets (12.5 mg total) by mouth 2 (two) times daily. 30 tablet 5   Multiple Vitamin (MULTIVITAMIN) tablet Take 1 tablet by mouth daily.     pantoprazole  (PROTONIX ) 40 MG tablet Take 1 tablet (40 mg total) by mouth daily. 30 tablet 5   Riboflavin (VITAMIN B-2 PO) Take 1 tablet by mouth daily.     sertraline  (ZOLOFT ) 100 MG tablet Take 1 tablet (100 mg total) by mouth daily. 90 tablet 1   torsemide   (DEMADEX ) 20 MG tablet Take 1 tablet (20 mg total) by mouth daily. 30 tablet 5   traMADol (ULTRAM) 50 MG tablet Take 50 mg by mouth every 6 (six) hours as needed for moderate pain (pain score 4-6).     traZODone  (DESYREL ) 100 MG tablet Take 1 tablet (100 mg total) by mouth at bedtime as needed. for sleep 90 tablet 3   vitamin B-12 (CYANOCOBALAMIN) 100 MCG tablet Take 100 mcg by mouth daily.     No current facility-administered medications for this visit.    Allergies  Allergen Reactions   Nsaids Other (See Comments)    GI bleed, PUD    Social History   Socioeconomic History   Marital status: Widowed    Spouse name: Not on file   Number of children: 3   Years of education: Not on file   Highest education level: 12th grade  Occupational History   Occupation: retired Psychiatric nurse power  Tobacco Use   Smoking status: Never   Smokeless tobacco: Never  Vaping Use   Vaping status: Never Used  Substance and Sexual Activity   Alcohol use: Not  Currently    Comment: last drank about 15 years ago   Drug use: Never   Sexual activity: Not on file  Other Topics Concern   Not on file  Social History Narrative   3 children.   Widowed since 01/16/2021. Married x 61 years.    Social Drivers of Corporate investment banker Strain: Low Risk  (05/12/2024)   Received from [facility]   Overall Financial Resource Strain (CARDIA)    How hard is it for you to pay for the very basics like food, housing, medical care, and heating?: Not hard at all  Food Insecurity: No Food Insecurity (05/18/2024)   Hunger Vital Sign    Worried About Running Out of Food in the Last Year: Never true    Ran Out of Food in the Last Year: Never true  Transportation Needs: No Transportation Needs (05/18/2024)   PRAPARE - Administrator, Civil Service (Medical): No    Lack of Transportation (Non-Medical): No  Physical Activity: Inactive (05/12/2024)   Received from [facility]   Exercise Vital Sign    On  average, how many days per week do you engage in moderate to strenuous exercise (like a brisk walk)?: 0 days    On average, how many minutes do you engage in exercise at this level?: 0 min  Stress: No Stress Concern Present (05/12/2024)   Received from [facility] - Occupational Stress Questionnaire    Do you feel stress - tense, restless, nervous, or anxious, or unable to sleep at night because your mind is troubled all the time - these days?: Not at all  Social Connections: Moderately Integrated (05/12/2024)   Received from [facility]   Social Connection and Isolation Panel    In a typical week, how many times do you talk on the phone with family, friends, or neighbors?: More than three times a week    How often do you get together with friends or relatives?: More than three times a week    How often do you attend church or religious services?: More than 4 times per year    Do you belong to any clubs or organizations such as church groups, unions, fraternal or athletic groups, or school groups?: No    How often do you attend meetings of the clubs or organizations you belong to?: More than 4 times per year    Are you married, widowed, divorced, separated, never married, or living with a partner?: Widowed  Intimate Partner Violence: Not At Risk (05/18/2024)   Humiliation, Afraid, Rape, and Kick questionnaire    Fear of Current or Ex-Partner: No    Emotionally Abused: No    Physically Abused: No    Sexually Abused: No    Family History  Problem Relation Age of Onset   Prostate cancer Brother    Prostate cancer Brother    Suicidality Brother    Prostate cancer Brother    Stomach cancer Neg Hx    Colon cancer Neg Hx     Review of Systems:  As stated in the HPI and otherwise negative.   BP 123/69 (BP Location: Left Arm, Patient Position: Sitting, Cuff Size: Normal)   Pulse 81   Ht 6' (1.829 m)   Wt 199 lb (90.3 kg)   SpO2 91%   BMI  26.99 kg/m   Physical Examination: General: Well developed, well nourished, NAD  HEENT: OP clear,  mucus membranes moist  SKIN: warm, dry. No rashes. Neuro: No focal deficits  Musculoskeletal: Muscle strength 5/5 all ext  Psychiatric: Mood and affect normal  Neck: No JVD, no carotid bruits, no thyromegaly, no lymphadenopathy.  Lungs:Clear bilaterally, no wheezes, rhonci, crackles Cardiovascular: Regular rate and rhythm. Loud, harsh, late peaking systolic murmur.  Abdomen:Soft. Bowel sounds present. Non-tender.  Extremities: No lower extremity edema. Pulses are 2 + in the bilateral DP/PT.  EKG:  EKG is ordered today. The ekg ordered today demonstrates  EKG Interpretation Date/Time:  Thursday June 03 2024 14:05:17 EDT Ventricular Rate:  67 PR Interval:  170 QRS Duration:  94 QT Interval:  412 QTC Calculation: 435 R Axis:   26  Text Interpretation: Normal sinus rhythm Normal ECG Confirmed by Verlin Bruckner [phone]) on 06/03/2024 2:09:05 PM   Echo 04/13/24:  1. Left ventricular ejection fraction, by estimation, is 60 to 65%. The  left ventricle has normal function. Left ventricular endocardial border  not optimally defined to evaluate regional wall motion. Left ventricular  diastolic function could not be  evaluated.   2. Right ventricular systolic function is normal. The right ventricular  size is normal. Tricuspid regurgitation signal is inadequate for assessing  PA pressure.   3. Left atrial size was severely dilated.   4. The mitral valve is abnormal. Trivial mitral valve regurgitation. No  evidence of mitral stenosis. Severe mitral annular calcification.   5. The aortic valve is tricuspid. There is severe calcifcation of the  aortic valve. There is severe thickening of the aortic valve. Aortic valve  regurgitation is mild. Severe aortic valve stenosis. Aortic valve area, by  VTI measures 0.85 cm. Aortic  valve mean gradient measures 40.7 mmHg. Aortic valve Vmax  measures 3.94  m/s. DVI is 0.23.   6. Aortic dilatation noted. There is borderline dilatation of the aortic  root, measuring 39 mm.   FINDINGS   Left Ventricle: Left ventricular ejection fraction, by estimation, is 60  to 65%. The left ventricle has normal function. Left ventricular  endocardial border not optimally defined to evaluate regional wall motion.  Strain was performed and the global  longitudinal strain is indeterminate. The left ventricular internal cavity  size was normal in size. There is no left ventricular hypertrophy. Left  ventricular diastolic function could not be evaluated due to mitral  annular calcification (moderate or  greater). Left ventricular diastolic function could not be evaluated.   Right Ventricle: The right ventricular size is normal. No increase in  right ventricular wall thickness. Right ventricular systolic function is  normal. Tricuspid regurgitation signal is inadequate for assessing PA  pressure.   Left Atrium: Left atrial size was severely dilated.   Right Atrium: Right atrial size was normal in size.   Pericardium: There is no evidence of pericardial effusion.   Mitral Valve: The mitral valve is abnormal. Severe mitral annular  calcification. Trivial mitral valve regurgitation. No evidence of mitral  valve stenosis.   Tricuspid Valve: The tricuspid valve is normal in structure. Tricuspid  valve regurgitation is mild . No evidence of tricuspid stenosis.   Aortic Valve: The aortic valve is tricuspid. There is severe calcifcation  of the aortic valve. There is severe thickening of the aortic valve.  Aortic valve regurgitation is mild. Severe aortic stenosis is present.  Aortic valve mean gradient measures 40.7   mmHg. Aortic valve peak gradient measures 62.2 mmHg. Aortic valve area,  by VTI measures 0.85 cm.   Pulmonic Valve: The pulmonic valve  was not well visualized. Pulmonic valve  regurgitation is trivial. No evidence of pulmonic  stenosis.   Aorta: Aortic dilatation noted. There is borderline dilatation of the  aortic root, measuring 39 mm.   Venous: The inferior vena cava was not well visualized.   IAS/Shunts: The interatrial septum was not well visualized.   Additional Comments: 3D was performed not requiring image post processing  on an independent workstation and was indeterminate.     LEFT VENTRICLE  PLAX 2D  LVIDd:         5.05 cm   Diastology  LVIDs:         2.90 cm   LV e' medial:    6.53 cm/s  LV PW:         1.00 cm   LV E/e' medial:  21.1  LV IVS:        1.10 cm   LV e' lateral:   10.70 cm/s  LVOT diam:     2.00 cm   LV E/e' lateral: 12.9  LV SV:         79  LV SV Index:   37  LVOT Area:     3.14 cm     RIGHT VENTRICLE  RV S prime:     14.00 cm/s  TAPSE (M-mode): 2.6 cm   LEFT ATRIUM              Index        RIGHT ATRIUM           Index  LA diam:        4.40 cm  2.07 cm/m   RA Area:     21.20 cm  LA Vol (A2C):   124.0 ml 58.37 ml/m  RA Volume:   58.40 ml  27.49 ml/m  LA Vol (A4C):   99.6 ml  46.88 ml/m  LA Biplane Vol: 112.0 ml 52.72 ml/m   AORTIC VALVE  AV Area (Vmax):    0.83 cm  AV Area (Vmean):   0.78 cm  AV Area (VTI):     0.85 cm  AV Vmax:           394.33 cm/s  AV Vmean:          308.333 cm/s  AV VTI:            0.933 m  AV Peak Grad:      62.2 mmHg  AV Mean Grad:      40.7 mmHg  LVOT Vmax:         103.87 cm/s  LVOT Vmean:        76.467 cm/s  LVOT VTI:          0.251 m  LVOT/AV VTI ratio: 0.27    AORTA  Ao Root diam: 3.90 cm   MITRAL VALVE  MV Area (PHT): 4.80 cm     SHUNTS  MV Decel Time: 158 msec     Systemic VTI:  0.25 m  MV E velocity: 138.00 cm/s  Systemic Diam: 2.00 cm  MV A velocity: 96.40 cm/s  MV E/A ratio:  1.43   Recent Labs: 04/12/2024: Magnesium  2.3; TSH 0.968 04/21/2024: B Natriuretic Peptide 543.7 05/17/2024: ALT 24; BUN 21; Creatinine, Ser 0.70; Hemoglobin 11.3; Platelets 259; Potassium 4.9; Sodium 135   Lipid Panel    Component Value  Date/Time   CHOL 165 10/08/2023 1355   TRIG 95 10/08/2023 1355   HDL 64 10/08/2023 1355   CHOLHDL 2.6 10/08/2023 1355  LDLCALC 84 10/08/2023 1355    Wt Readings from Last 3 Encounters:  06/03/24 199 lb (90.3 kg)  06/02/24 198 lb (89.8 kg)  05/28/24 203 lb 12.8 oz (92.4 kg)    Assessment and Plan:   1. Severe Aortic Valve Stenosis: He has severe, stage D aortic valve stenosis. NYHA class 2 symptoms. I have personally reviewed the echo images. The aortic valve is thickened and calcified with limited leaflet mobility. I think he would benefit from AVR. Given advanced age, he is not a good candidate for conventional AVR by surgical approach. I think he may be a good candidate for TAVR.   I have reviewed the natural history of aortic stenosis with the patient and their family members  who are present today. We have discussed the limitations of medical therapy and the poor prognosis associated with symptomatic aortic stenosis. We have reviewed potential treatment options, including palliative medical therapy, conventional surgical aortic valve replacement, and transcatheter aortic valve replacement. We discussed treatment options in the context of the patient's specific comorbid medical conditions.   He would like to proceed with planning for TAVR. I will arrange a right and left heart catheterization at [facility] 06/10/24 at noon. Risks and benefits of the cath procedure and the valve procedure are reviewed with the patient. After the cath, he will have a cardiac CT, CTA of the chest/abdomen and pelvis and will then be referred to see one of the CT surgeons on our TAVR team.     Labs/ tests ordered today include:  Orders Placed This Encounter  Procedures   Basic Metabolic Panel (BMET)   CBC   EKG 12-Lead   Disposition:   F/U will be arranged with the structural team  Signed, Lonni Cash, MD, [facility] 06/03/2024 2:26 PM    [facility] [address] [place], [place],  [place]  [address] Phone: [phone]; Fax: [phone]

## 2024-06-03 NOTE — Progress Notes (Signed)
 Pre Surgical Assessment: 5 M Walk Test  36M=16.69ft  5 Meter Walk Test- trial 1: 8.04 seconds 5 Meter Walk Test- trial 2: 6.45 seconds 5 Meter Walk Test- trial 3: 7.28 seconds 5 Meter Walk Test Average: 7.26 seconds

## 2024-06-04 ENCOUNTER — Ambulatory Visit: Payer: Self-pay | Admitting: Cardiovascular Disease

## 2024-06-04 DIAGNOSIS — I493 Ventricular premature depolarization: Secondary | ICD-10-CM

## 2024-06-04 DIAGNOSIS — I4729 Other ventricular tachycardia: Secondary | ICD-10-CM | POA: Diagnosis not present

## 2024-06-04 LAB — BASIC METABOLIC PANEL WITH GFR
BUN/Creatinine Ratio: 43 — AB (ref 10–24)
BUN: 38 mg/dL — AB (ref 10–36)
CO2: 30 mmol/L — AB (ref 20–29)
Calcium: 9.4 mg/dL (ref 8.6–10.2)
Chloride: 97 mmol/L (ref 96–106)
Creatinine, Ser: 0.88 mg/dL (ref 0.76–1.27)
Glucose: 109 mg/dL — AB (ref 70–99)
Potassium: 4.4 mmol/L (ref 3.5–5.2)
Sodium: 140 mmol/L (ref 134–144)
eGFR: 80 mL/min/1.73 (ref >59)

## 2024-06-04 LAB — CBC
Hematocrit: 38.1 % (ref 37.5–51.0)
Hemoglobin: 11.8 g/dL — ABNORMAL LOW (ref 13.0–17.7)
MCH: 29.4 pg (ref 26.6–33.0)
MCHC: 31 g/dL — ABNORMAL LOW (ref 31.5–35.7)
MCV: 95 fL (ref 79–97)
Platelets: 212 x10E3/uL (ref 150–450)
RBC: 4.01 x10E6/uL — ABNORMAL LOW (ref 4.14–5.80)
RDW: 13.1 % (ref 11.6–15.4)
WBC: 8.8 x10E3/uL (ref 3.4–10.8)

## 2024-06-08 ENCOUNTER — Telehealth: Payer: Self-pay | Admitting: *Deleted

## 2024-06-08 ENCOUNTER — Telehealth: Payer: Self-pay | Admitting: Student

## 2024-06-08 MED ORDER — METOPROLOL TARTRATE 25 MG PO TABS: ORAL_TABLET | ORAL | 3 refills | Status: DC

## 2024-06-08 NOTE — Telephone Encounter (Signed)
-----   Message from [name] Mallipeddi sent at 06/04/2024  5:24 PM EDT ----- Average HR 82 bpm.  145 runs of nonsustained SVT, 15 runs of NSVT, 6.9% PAC and 17.8% PVC burden noted.  No symptoms.  These arrhythmias are likely secondary to severe aortic valve stenosis.  Likely  will need to uptitrate beta-blocker therapy, increase metoprolol  tartrate from 12.5 mg to 25 mg twice daily.  Hold for BP less than 100 mmHg SBP. ----- Message ----- From: Stacia Diannah SQUIBB, MD Sent: 06/04/2024   4:34 PM EDT To: [name] Mallipeddi, MD

## 2024-06-08 NOTE — Telephone Encounter (Signed)
 I spoke with patient and discussed monitor results.he will increase lopressor  to 25 mg bid and has asked me to send MyChart message so son can see.

## 2024-06-08 NOTE — Telephone Encounter (Signed)
 I spoke with son and DIL and reviewed monitor results and med change.Son tells me he sees his father every day, sometimes twice a day and he will check his BP's

## 2024-06-08 NOTE — Telephone Encounter (Signed)
 Son is calling because he said someone called his dad about changing his medication and he needs to know what is going on. Please advise.

## 2024-06-08 NOTE — Telephone Encounter (Signed)
 Cardiac Catheterization scheduled at [facility] for: Thursday June 10, 2024 12 Noon Arrival time [facility] Entrance A at: 10 AM  Diet: -May have light meal until 6 AM. (6 hours before procedure time) Approved light meal consists of plain toast, fruit, light soups, crackers.  Hydration: -May drink clear liquids until 2 hours before the procedure.  Approved liquids: Water , clear tea, black coffee, fruit juices-non-citric and without pulp,Gatorade, plain Jello/popsicles.   -Please drink 16 oz of water  2 hours before procedure.  Medication instructions: -Hold:  Farxiga /Torsemide -AM of procedure -Other usual morning medications can be taken including aspirin  81 mg.  Plan to go home the same day, you will only stay overnight if medically necessary.  You must have responsible adult to drive you home.  Someone must be with you the first 24 hours after you arrive home.  Reviewed procedure instructions with patient's son (DPR), Tyrone.

## 2024-06-10 ENCOUNTER — Ambulatory Visit (HOSPITAL_COMMUNITY)
Admission: RE | Admit: 2024-06-10 | Discharge: 2024-06-10 | Disposition: A | Discharge status: Home / Self Care | Attending: Cardiovascular Disease | Admitting: Cardiovascular Disease

## 2024-06-10 ENCOUNTER — Other Ambulatory Visit: Payer: Self-pay

## 2024-06-10 ENCOUNTER — Encounter (HOSPITAL_COMMUNITY): Payer: Self-pay | Admitting: Cardiovascular Disease

## 2024-06-10 ENCOUNTER — Encounter (HOSPITAL_COMMUNITY)
Admission: RE | Disposition: A | Discharge status: Home / Self Care | Payer: Self-pay | Source: Home / Self Care | Attending: Cardiovascular Disease

## 2024-06-10 ENCOUNTER — Other Ambulatory Visit: Payer: Self-pay | Admitting: *Deleted

## 2024-06-10 DIAGNOSIS — I350 Nonrheumatic aortic (valve) stenosis: Secondary | ICD-10-CM | POA: Diagnosis present

## 2024-06-10 DIAGNOSIS — Z79899 Other long term (current) drug therapy: Secondary | ICD-10-CM | POA: Diagnosis not present

## 2024-06-10 DIAGNOSIS — I5032 Chronic diastolic (congestive) heart failure: Secondary | ICD-10-CM | POA: Insufficient documentation

## 2024-06-10 DIAGNOSIS — I2510 Atherosclerotic heart disease of native coronary artery without angina pectoris: Secondary | ICD-10-CM | POA: Diagnosis not present

## 2024-06-10 DIAGNOSIS — Z9981 Dependence on supplemental oxygen: Secondary | ICD-10-CM | POA: Insufficient documentation

## 2024-06-10 HISTORY — PX: RIGHT HEART CATH AND CORONARY ANGIOGRAPHY: CATH118264

## 2024-06-10 LAB — POCT I-STAT EG7
Acid-Base Excess: 3 mmol/L — ABNORMAL HIGH (ref 0.0–2.0)
Acid-Base Excess: 4 mmol/L — ABNORMAL HIGH (ref 0.0–2.0)
Bicarbonate: 29.6 mmol/L — ABNORMAL HIGH (ref 20.0–28.0)
Bicarbonate: 30 mmol/L — ABNORMAL HIGH (ref 20.0–28.0)
Calcium, Ion: 1.2 mmol/L (ref 1.15–1.40)
Calcium, Ion: 1.21 mmol/L (ref 1.15–1.40)
HCT: 34 % — ABNORMAL LOW (ref 39.0–52.0)
HCT: 34 % — ABNORMAL LOW (ref 39.0–52.0)
Hemoglobin: 11.6 g/dL — ABNORMAL LOW (ref 13.0–17.0)
Hemoglobin: 11.6 g/dL — ABNORMAL LOW (ref 13.0–17.0)
O2 Saturation: 59 %
O2 Saturation: 63 %
Potassium: 4.2 mmol/L (ref 3.5–5.1)
Potassium: 4.2 mmol/L (ref 3.5–5.1)
Sodium: 138 mmol/L (ref 135–145)
Sodium: 138 mmol/L (ref 135–145)
TCO2: 31 mmol/L (ref 22–32)
TCO2: 32 mmol/L (ref 22–32)
pCO2, Ven: 51.3 mmHg (ref 44–60)
pCO2, Ven: 51.8 mmHg (ref 44–60)
pH, Ven: 7.369 (ref 7.25–7.43)
pH, Ven: 7.37 (ref 7.25–7.43)
pO2, Ven: 32 mmHg (ref 32–45)
pO2, Ven: 34 mmHg (ref 32–45)

## 2024-06-10 LAB — POCT I-STAT 7, (LYTES, BLD GAS, ICA,H+H)
Acid-Base Excess: 4 mmol/L — ABNORMAL HIGH (ref 0.0–2.0)
Bicarbonate: 29 mmol/L — ABNORMAL HIGH (ref 20.0–28.0)
Calcium, Ion: 1.17 mmol/L (ref 1.15–1.40)
HCT: 34 % — ABNORMAL LOW (ref 39.0–52.0)
Hemoglobin: 11.6 g/dL — ABNORMAL LOW (ref 13.0–17.0)
O2 Saturation: 97 %
Potassium: 4.2 mmol/L (ref 3.5–5.1)
Sodium: 138 mmol/L (ref 135–145)
TCO2: 30 mmol/L (ref 22–32)
pCO2 arterial: 46.2 mmHg (ref 32–48)
pH, Arterial: 7.406 (ref 7.35–7.45)
pO2, Arterial: 89 mmHg (ref 83–108)

## 2024-06-10 SURGERY — RIGHT HEART CATH AND CORONARY ANGIOGRAPHY
Anesthesia: LOCAL

## 2024-06-10 MED ORDER — SODIUM CHLORIDE 0.9% FLUSH: 3.0000 mL | INTRAVENOUS | Status: DC | PRN

## 2024-06-10 MED ORDER — FREE WATER: 500.0000 mL | Freq: Once | Status: DC

## 2024-06-10 MED ORDER — LIDOCAINE HCL (PF) 1 % IJ SOLN
INTRAMUSCULAR | Status: AC
  Filled 2024-06-10: qty 30

## 2024-06-10 MED ORDER — ONDANSETRON HCL 4 MG/2ML IJ SOLN: 4.0000 mg | Freq: Four times a day (QID) | INTRAMUSCULAR | Status: DC | PRN

## 2024-06-10 MED ORDER — SODIUM CHLORIDE 0.9% FLUSH: 3.0000 mL | Freq: Two times a day (BID) | INTRAVENOUS | Status: DC

## 2024-06-10 MED ORDER — HEPARIN SODIUM (PORCINE) 1000 UNIT/ML IJ SOLN
INTRAMUSCULAR | Status: AC
  Filled 2024-06-10: qty 10

## 2024-06-10 MED ORDER — HEPARIN (PORCINE) IN NACL 1000-0.9 UT/500ML-% IV SOLN
INTRAVENOUS | Status: DC | PRN
  Administered 2024-06-10: 1000 mL via SURGICAL_CAVITY

## 2024-06-10 MED ORDER — HEPARIN SODIUM (PORCINE) 1000 UNIT/ML IJ SOLN: INTRAMUSCULAR | Status: DC | PRN

## 2024-06-10 MED ORDER — ASPIRIN 81 MG PO CHEW: 81.0000 mg | CHEWABLE_TABLET | ORAL | Status: DC

## 2024-06-10 MED ORDER — SODIUM CHLORIDE 0.9 % IV SOLN: 250.0000 mL | INTRAVENOUS | Status: DC | PRN

## 2024-06-10 MED ORDER — HYDRALAZINE HCL 20 MG/ML IJ SOLN: 10.0000 mg | INTRAMUSCULAR | Status: DC | PRN

## 2024-06-10 MED ORDER — VERAPAMIL HCL 2.5 MG/ML IV SOLN
INTRAVENOUS | Status: AC
  Filled 2024-06-10: qty 2

## 2024-06-10 MED ORDER — ACETAMINOPHEN 325 MG PO TABS: 650.0000 mg | ORAL_TABLET | ORAL | Status: DC | PRN

## 2024-06-10 MED ORDER — VERAPAMIL HCL 2.5 MG/ML IV SOLN
INTRAVENOUS | Status: DC | PRN
  Administered 2024-06-10: 10 mL via INTRA_ARTERIAL

## 2024-06-10 MED ORDER — LABETALOL HCL 5 MG/ML IV SOLN: 10.0000 mg | INTRAVENOUS | Status: DC | PRN

## 2024-06-10 MED ORDER — LIDOCAINE HCL (PF) 1 % IJ SOLN
INTRAMUSCULAR | Status: DC | PRN
  Administered 2024-06-10: 5 mL
  Administered 2024-06-10: 10 mL

## 2024-06-10 MED ORDER — IOHEXOL 350 MG/ML SOLN
INTRAVENOUS | Status: DC | PRN
  Administered 2024-06-10: 25 mL

## 2024-06-10 SURGICAL SUPPLY — 15 items
CATH 5FR JL3.5 JR4 ANG PIG MP (CATHETERS) IMPLANT
CATH BALLN WEDGE 5F 110CM (CATHETERS) IMPLANT
CATH INFINITI 5FR JL4 (CATHETERS) IMPLANT
CLOSURE MYNX CONTROL 5F (Vascular Products) IMPLANT
DEVICE RAD COMP TR BAND LRG (VASCULAR PRODUCTS) IMPLANT
GLIDESHEATH SLEND SS 6F .021 (SHEATH) IMPLANT
GUIDEWIRE .025 260CM (WIRE) IMPLANT
GUIDEWIRE ANGLED .035X150CM (WIRE) IMPLANT
GUIDEWIRE INQWIRE 1.5J.035X260 (WIRE) IMPLANT
PACK CARDIAC CATHETERIZATION (CUSTOM PROCEDURE TRAY) ×1 IMPLANT
SET ATX-X65L (MISCELLANEOUS) IMPLANT
SHEATH GLIDE SLENDER 4/5FR (SHEATH) IMPLANT
SHEATH PINNACLE 5F 10CM (SHEATH) IMPLANT
SHEATH PROBE COVER 6X72 (BAG) IMPLANT
WIRE MICRO SET SILHO 5FR 7 (SHEATH) IMPLANT

## 2024-06-10 NOTE — Discharge Instructions (Signed)
 Femoral Site Care The following information offers guidance on how to care for yourself after your procedure. Your health care provider may also give you more specific instructions. If you have problems or questions, contact your health care provider. What can I expect after the procedure? After the procedure, it is common to have bruising and tenderness at the incision site. This usually fades within 1-2 weeks. Follow these instructions at home: Incision site care  Follow instructions from your health care provider about how to take care of your incision site. Make sure you: Wash your hands with soap and water for at least 20 seconds before and after you change your bandage (dressing). If soap and water are not available, use hand sanitizer. Remove your dressing in 24 hours. Leave stitches (sutures), skin glue, or adhesive strips in place. These skin closures may need to stay in place for 2 weeks or longer. If adhesive strip edges start to loosen and curl up, you may trim the loose edges. Do not remove adhesive strips completely unless your health care provider tells you to do that. Do not take baths, swim, or use a hot tub for at least 1 week. You may shower 24 hours after the procedure or as told by your health care provider. Gently wash the incision site with plain soap and water. Pat the area dry with a clean towel. Do not rub the site. This may cause bleeding. Do not apply powder or lotion to the site. Keep the site clean and dry. Check your femoral site every day for signs of infection. Check for: Redness, swelling, or pain. Fluid or blood. Warmth. Pus or a bad smell. Activity If you were given a sedative during the procedure, it can affect you for several hours. Do not drive or operate machinery until your health care provider says that it is safe. Rest as told by your health care provider. Avoid sitting for a long time without moving. Get up to take short walks every 1-2 hours. This  is important to improve blood flow and breathing. Ask for help if you feel weak or unsteady. Return to your normal activities as told by your health care provider. Ask your health care provider what activities are safe for you and when you can return to work. Avoid activities that take a lot of effort for the first 2-3 days after your procedure, or as long as directed. Do not lift anything that is heavier than 10 lb (4.5 kg), or the limit that you are told, until your health care provider says that it is safe. General instructions Take over-the-counter and prescription medicines only as told by your health care provider. If you will be going home right after the procedure, plan to have a responsible adult care for you for the time you are told. This is important. Keep all follow-up visits. This is important. Contact a health care provider if: You have a fever or chills. You have any of these signs of infection at your incision site: Redness, swelling, or pain. Fluid or blood. Warmth. Pus or a bad smell. Get help right away if: The incision area swells very fast. The incision area is bleeding, and the bleeding does not stop when you hold steady pressure on the area. Your leg or foot becomes pale, cool, tingly, or numb. These symptoms may represent a serious problem that is an emergency. Do not wait to see if the symptoms will go away. Get medical help right away. Call your local emergency  services (911 in the [place] Do not drive yourself to the hospital. Summary After the procedure, it is common to have bruising and tenderness that fade within 1-2 weeks. Check your femoral site every day for signs of infection. Do not lift anything that is heavier than 10 lb (4.5 kg), or the limit that you are told, until your health care provider says that it is safe. Get help right away if the incision area swells very fast, you have bleeding at the incision area that does not stop, or your leg or foot  becomes pale, cool, or numb. This information is not intended to replace advice given to you by your health care provider. Make sure you discuss any questions you have with your health care provider. Document Revised: 05/02/2021 Document Reviewed: 10/01/2020 Elsevier Patient Education  2024 [employer].

## 2024-06-10 NOTE — Progress Notes (Signed)
 Patient and patient son given discharge instructions, education provided no further questions at this time. Patient able to ambulate and void before discharge. Able to tolerate PO intake. Patient sites are clean, dry, intact and soft upon discharge.

## 2024-06-10 NOTE — Patient Instructions (Signed)
 Visit Information  Thank you for taking time to visit with me today. Please don't hesitate to contact me if I can be of assistance to you before our next scheduled telephone appointment.  Our next appointment is by telephone on  at 06/17/24 @ 215 pm  Following is a copy of your care plan:   Goals Addressed             This Visit's Progress    VBCI Transitions of Care (TOC) Care Plan       Problems:  Recent Hospitalization for treatment of CHF Pt weighs daily, son provides oversight for medications, spoke with pt and son [name],  no new concerns reported, pt saw cardiologist on 10/3, has follow up with cardiologist Dr. Verlin on 10/9 for TAVR consult/ review 06/10/24- spoke with pt who reports he is in the car on the way to hospital for cardiac cath, not sure if will be admitted, no new concerns reported  Goal:  Over the next 30 days, the patient will not experience hospital readmission  Interventions:  Heart Failure Interventions: Basic overview and discussion of pathophysiology of Heart Failure reviewed Discussed importance of daily weight and advised patient to weigh and record daily Discussed the importance of keeping all appointments with provider Assessed social determinant of health barriers  Reviewed HF action plan Reviewed safety precautions Reviewed oxygen safety Reviewed importance of taking medications as prescribed  Patient Self Care Activities:  Attend all scheduled provider appointments Attend church or other social activities Call pharmacy for medication refills 3-7 days in advance of running out of medications Call provider office for new concerns or questions  Notify RN Care Manager of TOC call rescheduling needs Participate in Transition of Care Program/Attend TOC scheduled calls Take medications as prescribed   call office if I gain more than 2 pounds in one day or 5 pounds in one week keep legs up while sitting track weight in diary use salt in  moderation watch for swelling in feet, ankles and legs every day weigh myself daily develop a rescue plan follow rescue plan if symptoms flare-up dress right for the weather, hot or cold Will continue to follow- pt scheduled for cardiac cath today 06/10/24  Plan:  Telephone follow up appointment with care management team member scheduled for:  06/17/24 @ 215 pm The patient has been provided with contact information for the care management team and has been advised to call with any health related questions or concerns.         Patient verbalizes understanding of instructions and care plan provided today and agrees to view in MyChart. Active MyChart status and patient understanding of how to access instructions and care plan via MyChart confirmed with patient.     Telephone follow up appointment with care management team member scheduled for: 06/17/24 @ 215 pm  Please call the care guide team at [phone] if you need to cancel or reschedule your appointment.   Please call the Suicide and Crisis Lifeline: 988 call the [place]  National Suicide Prevention Lifeline: 972-481-6478 or TTY: 9362849154 TTY (540)140-4423) to talk to a trained counselor call 1-800-273-TALK (toll free, 24 hour hotline) go to [facility] [address] [place] [phone]) call the [facility] Crisis Line: (620)122-9414 call 911 if you are experiencing a Mental Health or Behavioral Health Crisis or need someone to talk to.  [name] [facility], BSN RN Care Manager/ Transition of Care [place]/ [facility] [phone]

## 2024-06-10 NOTE — Interval H&P Note (Signed)
 History and Physical Interval Note:  06/10/2024 10:55 AM  Reeve C Klee  has presented today for surgery, with the diagnosis of Severe Aortic Stenosis.  The various methods of treatment have been discussed with the patient and family. After consideration of risks, benefits and other options for treatment, the patient has consented to  Procedure(s): RIGHT/LEFT HEART CATH AND CORONARY ANGIOGRAPHY (N/A) as a surgical intervention.  The patient's history has been reviewed, patient examined, no change in status, stable for surgery.  I have reviewed the patient's chart and labs.  Questions were answered to the patient's satisfaction.    Cath Lab Visit (complete for each Cath Lab visit)  Clinical Evaluation Leading to the Procedure:   ACS: No.  Non-ACS:    Anginal Classification: No Symptoms  Anti-ischemic medical therapy: Minimal Therapy (1 class of medications)  Non-Invasive Test Results: No non-invasive testing performed  Prior CABG: No previous CABG        Lonni Cash

## 2024-06-10 NOTE — Patient Outreach (Signed)
 Transition of Care week 4  Visit Note  06/10/2024  Name: Gregory Pearson MRN: [id number]          DOB: 1928/01/12  Situation: Patient enrolled in [facility] 30-day program. Visit completed with patient by telephone.   Background:  Discharge Date and Diagnosis: 05/14/24, Acute on chronic congestive heart failure   Past Medical History:  Diagnosis Date   Aortic stenosis    Asbestos exposure    Blood transfusion without reported diagnosis    after ruptured appendix 1963   Diverticulosis    Gastric ulcer 06/2019   GERD (gastroesophageal reflux disease)    History of stomach ulcers 1995   H.pylori s/p treatment   History of stomach ulcers    Hyperlipidemia    Muscle strain of right gluteal region 01/24/2014   Pneumonia    Prostate cancer (HCC) 2002   prostatectomy   Traumatic hematoma of buttock 01/24/2014    Assessment: Patient Reported Symptoms: Cognitive Cognitive Status: No symptoms reported, Alert and oriented to person, place, and time, Normal speech and language skills, Able to follow simple commands      Neurological Neurological Review of Symptoms: No symptoms reported    HEENT HEENT Symptoms Reported: No symptoms reported      Cardiovascular Cardiovascular Symptoms Reported: No symptoms reported Does patient have uncontrolled Hypertension?: No Cardiovascular Self-Management Outcome: 4 (good) Cardiovascular Comment: pt reports he is weighing daily, does not report today's weight, pt is in car otw to hospital for scheduled cardiac cath  Respiratory Respiratory Symptoms Reported: No symptoms reported    Endocrine Endocrine Symptoms Reported: No symptoms reported    Gastrointestinal Gastrointestinal Symptoms Reported: No symptoms reported      Genitourinary Genitourinary Symptoms Reported: No symptoms reported    Integumentary Integumentary Symptoms Reported: No symptoms reported    Musculoskeletal Musculoskelatal Symptoms Reviewed: Unsteady gait Additional  Musculoskeletal Details: uses walker as needed Musculoskeletal Management Strategies: Adequate rest, Medical device, Routine screening Musculoskeletal Self-Management Outcome: 4 (good) Musculoskeletal Comment: reinforced safety precautions      Psychosocial Psychosocial Symptoms Reported: No symptoms reported         There were no vitals filed for this visit. Unable to review medications today, pt is in the car, does not have medications in front of him Medications Reviewed Today   Medications were not reviewed in this encounter     Goals Addressed             This Visit's Progress    VBCI Transitions of Care (TOC) Care Plan       Problems:  Recent Hospitalization for treatment of CHF Pt weighs daily, son provides oversight for medications, spoke with pt and son [name],  no new concerns reported, pt saw cardiologist on 10/3, has follow up with cardiologist Dr. Verlin on 10/9 for TAVR consult/ review 06/10/24- spoke with pt who reports he is in the car on the way to hospital for cardiac cath, not sure if will be admitted, no new concerns reported  Goal:  Over the next 30 days, the patient will not experience hospital readmission  Interventions:  Heart Failure Interventions: Basic overview and discussion of pathophysiology of Heart Failure reviewed Discussed importance of daily weight and advised patient to weigh and record daily Discussed the importance of keeping all appointments with provider Assessed social determinant of health barriers  Reviewed HF action plan Reviewed safety precautions Reviewed oxygen safety Reviewed importance of taking medications as prescribed  Patient Self Care Activities:  Attend all scheduled provider  appointments Attend church or other social activities Call pharmacy for medication refills 3-7 days in advance of running out of medications Call provider office for new concerns or questions  Notify RN Care Manager of TOC call rescheduling  needs Participate in Transition of Care Program/Attend TOC scheduled calls Take medications as prescribed   call office if I gain more than 2 pounds in one day or 5 pounds in one week keep legs up while sitting track weight in diary use salt in moderation watch for swelling in feet, ankles and legs every day weigh myself daily develop a rescue plan follow rescue plan if symptoms flare-up dress right for the weather, hot or cold Will continue to follow- pt scheduled for cardiac cath today 06/10/24  Plan:  Telephone follow up appointment with care management team member scheduled for:  06/17/24 @ 215 pm The patient has been provided with contact information for the care management team and has been advised to call with any health related questions or concerns.         Recommendation:   PCP Follow-up Pt scheduled for cardiac cath today 06/10/24  Follow Up Plan:   Telephone follow-up 06/17/24 @ 215 pm  [name] [facility], BSN RN Care Manager/ Transition of Care [place]/ [facility] [phone]

## 2024-06-17 ENCOUNTER — Other Ambulatory Visit: Payer: Self-pay | Admitting: *Deleted

## 2024-06-17 NOTE — Patient Outreach (Signed)
 Transition of Care week 5  Visit Note  06/17/2024  Name: Gregory Pearson MRN: [id number]          DOB: 1928-07-28  Situation: Patient enrolled in [facility] 30-day program. Visit completed with patient by telephone.   Background:  Discharge Date and Diagnosis: 05/14/24, Acute on chronic congestive heart failure   Past Medical History:  Diagnosis Date   Aortic stenosis    Asbestos exposure    Blood transfusion without reported diagnosis    after ruptured appendix 1963   Diverticulosis    Gastric ulcer 06/2019   GERD (gastroesophageal reflux disease)    History of stomach ulcers 1995   H.pylori s/p treatment   History of stomach ulcers    Hyperlipidemia    Muscle strain of right gluteal region 01/24/2014   Pneumonia    Prostate cancer (HCC) 2002   prostatectomy   Traumatic hematoma of buttock 01/24/2014    Assessment: Patient Reported Symptoms: Cognitive Cognitive Status: No symptoms reported, Alert and oriented to person, place, and time, Normal speech and language skills, Able to follow simple commands      Neurological Neurological Review of Symptoms: No symptoms reported    HEENT HEENT Symptoms Reported: No symptoms reported      Cardiovascular Cardiovascular Symptoms Reported: No symptoms reported (pt states he has no edema) Does patient have uncontrolled Hypertension?: No Cardiovascular Management Strategies: Adequate rest, Medication therapy Weight: 203 lb (92.1 kg) Cardiovascular Self-Management Outcome: 4 (good) Cardiovascular Comment: pt states he has been eating alot more food, gained weight   a little at a time, pt states he is cutting back on my food  Respiratory Respiratory Symptoms Reported: No symptoms reported Respiratory Management Strategies: Oxygen therapy Respiratory Self-Management Outcome: 4 (good)  Endocrine Endocrine Symptoms Reported: No symptoms reported Is patient diabetic?: No    Gastrointestinal Gastrointestinal Symptoms Reported: No  symptoms reported      Genitourinary Genitourinary Symptoms Reported: No symptoms reported    Integumentary Integumentary Symptoms Reported: No symptoms reported    Musculoskeletal Musculoskelatal Symptoms Reviewed: Unsteady gait Additional Musculoskeletal Details: uses walker as needed Musculoskeletal Management Strategies: Adequate rest, Routine screening, Medical device Musculoskeletal Self-Management Outcome: 4 (good) Musculoskeletal Comment: reviewed safety precautions      Psychosocial Psychosocial Symptoms Reported: No symptoms reported         There were no vitals filed for this visit.    Pt reports he has all medications and taking as prescribed, did not speak with patient's son and was not unable to review, marked reviewed as per patient report.  Medications Reviewed Today     Reviewed by [name] LABOR, RN (Registered Nurse) on 06/17/24 at 1423  Med List Status: <None>   Medication Order Taking? Sig Documenting Provider Last Dose Status Informant  acetaminophen  (TYLENOL ) 500 MG tablet [id number] No Take 1 tablet (500 mg total) by mouth every 6 (six) hours as needed. [name] HERO, NP 06/10/2024 Morning Active Self  ascorbic acid (VITAMIN C) 500 MG tablet [id number] No Take 500 mg by mouth in the morning. [provider] 06/10/2024 Morning Active Self  aspirin  81 MG chewable tablet [id number] No Chew 1 tablet (81 mg total) by mouth daily with breakfast. [name], MD 06/10/2024 Morning Active Self  Cyanocobalamin (VITAMIN B-12 PO) [id number] No Take 1 tablet by mouth in the morning. [provider] 06/10/2024 Morning Active Self  dapagliflozin  propanediol (FARXIGA ) 10 MG TABS tablet [id number] No Take 1 tablet (10 mg total) by mouth daily before breakfast. Hayes,  Beckey CROME, NP 06/06/2024 Active Self           Med Note CLAUD MICHEAL ONEIDA Sonjia Jun 08, 2024  2:56 PM) On hold due to upcoming procedure  MAGNESIUM  LACTATE PO [id number] No Take 1 tablet by  mouth at bedtime. [provider] 06/10/2024 Morning Active Self  metoprolol  tartrate (LOPRESSOR ) 25 MG tablet [id number] No Take 1 tablet (25 mg total) twice a day.HOLD for blood pressure less than 100 Mallipeddi, [name], MD 06/10/2024 Morning Active Self  Multiple Vitamin (MULTIVITAMIN) tablet [id number] No Take 1 tablet by mouth in the morning. [provider] 06/10/2024 Morning Active Self  pantoprazole  (PROTONIX ) 40 MG tablet [id number] No Take 1 tablet (40 mg total) by mouth daily. [name], MD 06/10/2024 Morning Active Self  Riboflavin (VITAMIN B-2 PO) [id number] No Take 1 tablet by mouth daily. [provider] Unknown Active Self  sertraline  (ZOLOFT ) 100 MG tablet [id number] No Take 1 tablet (100 mg total) by mouth daily. Dettinger, Fonda LABOR, MD Unknown Active Self  torsemide  (DEMADEX ) 20 MG tablet [id number] No Take 1 tablet (20 mg total) by mouth daily.  Patient taking differently: Take 20 mg by mouth daily in the afternoon.   [name], MD 06/09/2024 Active Self  traMADol (ULTRAM) 50 MG tablet [id number] No Take 50 mg by mouth every 6 (six) hours as needed for moderate pain (pain score 4-6). [provider] More than a month Active Self  traZODone  (DESYREL ) 100 MG tablet [id number] No Take 1 tablet (100 mg total) by mouth at bedtime as needed. for sleep  Patient taking differently: Take 50-100 mg by mouth at bedtime as needed for sleep.   Dettinger, Fonda LABOR, MD 06/09/2024 Active Self            Goals Addressed             This Visit's Progress    COMPLETED: VBCI Transitions of Care (TOC) Care Plan       Problems:  Recent Hospitalization for treatment of CHF Pt weighs daily, son provides oversight for medications, spoke with pt and son [name],  no new concerns reported, pt saw cardiologist on 10/3, has follow up with cardiologist Dr. Verlin on 10/9 for TAVR consult/ review 06/10/24- spoke with pt who reports he is in the car on  the way to hospital for cardiac cath, not sure if will be admitted, no new concerns reported 06/17/24- spoke with pt who reports  did well with my procedure  states incisions/ puncture sites to arm  look good, pt weighed 198 pounds on 10/18, states over the course of 2 weeks has gained 5 pounds, pt attributes to eating more, states  I'm cutting back  pt denies having edema, no dyspnea.  Goal:  Over the next 30 days, the patient will not experience hospital readmission  Interventions:  Heart Failure Interventions: Basic overview and discussion of pathophysiology of Heart Failure reviewed Discussed importance of daily weight and advised patient to weigh and record daily Discussed the importance of keeping all appointments with provider Assessed social determinant of health barriers  Reinforced HF action plan, importance of calling provider early on for change in health status, symptoms Reviewed safety precautions Reviewed oxygen safety Reviewed importance of taking medications as prescribed Reviewed upcoming appointments including 11/14 Dr. Shyrl for TAVR consultation,  10/24- cardiac CT Reviewed plan of care with pt including TOC case closure, pt adamantly refuses transfer to longitudinal case manager citing he has too many appointments and  other things going on  Patient Self Care Activities:  Attend all scheduled provider appointments Attend church or other social activities Call pharmacy for medication refills 3-7 days in advance of running out of medications Call provider office for new concerns or questions  Notify RN Care Manager of TOC call rescheduling needs Participate in Transition of Care Program/Attend TOC scheduled calls Take medications as prescribed   call office if I gain more than 2 pounds in one day or 5 pounds in one week keep legs up while sitting track weight in diary use salt in moderation watch for swelling in feet, ankles and legs every day weigh myself  daily develop a rescue plan follow rescue plan if symptoms flare-up dress right for the weather, hot or cold TOC case closure  Plan:  No further follow up required: case closure The patient has been provided with contact information for the care management team and has been advised to call with any health related questions or concerns.         Recommendation:   PCP Follow-up Specialty provider follow-up Dr. Shyrl 07/09/24  Follow Up Plan:   Closing From:  Transitions of Care Program  [name] [facility], BSN RN Care Manager/ Transition of Care [place]/ [facility] [phone]

## 2024-06-17 NOTE — Patient Instructions (Signed)
 Visit Information  Thank you for taking time to visit with me today. Please don't hesitate to contact me if I can be of assistance to you before our next scheduled telephone appointment.  Our next appointment is no further scheduled appointments.   Following is a copy of your care plan:   Goals Addressed             This Visit's Progress    COMPLETED: [facility] Transitions of Care (TOC) Care Plan       Problems:  Recent Hospitalization for treatment of CHF Pt weighs daily, son provides oversight for medications, spoke with pt and son Gregory Pearson,  no new concerns reported, pt saw cardiologist on 10/3, has follow up with cardiologist Dr. Verlin on 10/9 for TAVR consult/ review 06/10/24- spoke with pt who reports he is in the car on the way to hospital for cardiac cath, not sure if will be admitted, no new concerns reported 06/17/24- spoke with pt who reports  did well with my procedure  states incisions/ puncture sites to arm  look good, pt weighed 198 pounds on 10/18, states over the course of 2 weeks has gained 5 pounds, pt attributes to eating more, states  I'm cutting back  pt denies having edema, no dyspnea.  Goal:  Over the next 30 days, the patient will not experience hospital readmission  Interventions:  Heart Failure Interventions: Basic overview and discussion of pathophysiology of Heart Failure reviewed Discussed importance of daily weight and advised patient to weigh and record daily Discussed the importance of keeping all appointments with provider Assessed social determinant of health barriers  Reinforced HF action plan, importance of calling provider early on for change in health status, symptoms Reviewed safety precautions Reviewed oxygen safety Reviewed importance of taking medications as prescribed Reviewed upcoming appointments including 11/14 Dr. Shyrl for TAVR consultation,  10/24- cardiac CT Reviewed plan of care with pt including TOC case closure, pt  adamantly refuses transfer to longitudinal case manager citing he has too many appointments and other things going on  Patient Self Care Activities:  Attend all scheduled provider appointments Attend church or other social activities Call pharmacy for medication refills 3-7 days in advance of running out of medications Call provider office for new concerns or questions  Notify RN Care Manager of TOC call rescheduling needs Participate in Transition of Care Program/Attend TOC scheduled calls Take medications as prescribed   call office if I gain more than 2 pounds in one day or 5 pounds in one week keep legs up while sitting track weight in diary use salt in moderation watch for swelling in feet, ankles and legs every day weigh myself daily develop a rescue plan follow rescue plan if symptoms flare-up dress right for the weather, hot or cold TOC case closure  Plan:  No further follow up required: case closure The patient has been provided with contact information for the care management team and has been advised to call with any health related questions or concerns.         Care plan and visit instructions communicated with the patient verbally today. Patient agrees to receive a copy in MyChart. Active MyChart status and patient understanding of how to access instructions and care plan via MyChart confirmed with patient.     No further follow up required: case closure  Please call the care guide team at [phone] if you need to cancel or reschedule your appointment.   Please call the Suicide and Crisis Lifeline:  988 call the [place]  National Suicide Prevention Lifeline: (639)804-7958 or TTY: (845) 413-7488 TTY (267)831-6466) to talk to a trained counselor call 1-800-273-TALK (toll free, 24 hour hotline) go to [facility] [address] [place] [phone]) call the [facility] Crisis Line: (629)254-0960 call 911 if you are  experiencing a Mental Health or Behavioral Health Crisis or need someone to talk to.  [name] [facility], BSN RN Care Manager/ Transition of Care [place]/ [facility] [phone]

## 2024-06-18 ENCOUNTER — Ambulatory Visit (HOSPITAL_COMMUNITY)
Admission: RE | Admit: 2024-06-18 | Discharge: 2024-06-18 | Disposition: A | Discharge status: Home / Self Care | Attending: Cardiovascular Disease | Admitting: Cardiovascular Disease

## 2024-06-18 DIAGNOSIS — I350 Nonrheumatic aortic (valve) stenosis: Secondary | ICD-10-CM | POA: Diagnosis not present

## 2024-06-18 MED ORDER — IOHEXOL 350 MG/ML SOLN
100.0000 mL | Freq: Once | INTRAVENOUS | Status: AC | PRN
  Administered 2024-06-18: 100 mL via INTRAVENOUS

## 2024-06-21 ENCOUNTER — Ambulatory Visit: Payer: Self-pay | Admitting: Cardiovascular Disease

## 2024-06-21 NOTE — Progress Notes (Signed)
 Procedure Type: Isolated AVR Perioperative Outcome Estimate % Operative Mortality 4.15% Morbidity & Mortality 8.46% Stroke 2.26% Renal Failure 1.77% Reoperation 3.04% Prolonged Ventilation 4.13% Deep Sternal Wound Infection 0.043% Long Hospital Stay (>14 days) 5.38% Short Hospital Stay (<6 days)* 29.8%

## 2024-07-08 ENCOUNTER — Ambulatory Visit

## 2024-07-08 VITALS — BP 93/57 | HR 63 | Ht 72.0 in | Wt 199.0 lb

## 2024-07-08 DIAGNOSIS — I350 Nonrheumatic aortic (valve) stenosis: Secondary | ICD-10-CM | POA: Diagnosis not present

## 2024-07-08 NOTE — H&P (View-Only) (Signed)
 [facility]   MULTIDISCIPLINARY HEART VALVE CLINIC    BURL TAUZIN [facility] Medical Record #[id number] Date of Birth: 01/29/1928  Referring: Stacia Diannah SQUIBB, MD Primary Care: Dettinger, Fonda LABOR, MD Primary Cardiologist:Vishnu SQUIBB Stacia, MD  Chief Complaint:    Chief Complaint  Patient presents with   Aortic Stenosis    TAVR consult    History of Present Illness:     Gregory Pearson is a 89 y.o. male presents for evaluation of severe aortic stenosis and TAVR.  He has a history of chronic diastolic HF, PVCs, COPD, GERD and prostate cancer.  He also has severe AS and was admitted to [facility] in August with respiratory failure due to acute CHF.  He has since been on Torsemide  and feels much better.    Overall he feels well.  His breathing is much better as is his leg swelling.  He denies chest pain, palpitations, syncope or near syncope.  He has a history of asbestos exposure, no history of chest surgery or radiation.  He lives by himself but it's on his daughter's property who's about 50 ft away.  His son lives across the street.  He is active, enjoys gardening and is going dancing tonight.  He uses 2L [place] at all times and uses a walker for ambulation.  Of note, he is DNR but will rescind for the TAVR.  My measurements: Annulus: Area 516, perimeter 82.4 RCA 15.6, LCA 11.8 - big chunk of calcium  below LCA in the LVOT SOV: 33-35 Femoral arteries:  Poorly opacified on scan but appear capacious.   Past Medical History:  Diagnosis Date   Aortic stenosis    Asbestos exposure    Blood transfusion without reported diagnosis    after ruptured appendix 1963   Diverticulosis    Gastric ulcer 06/2019   GERD (gastroesophageal reflux disease)    History of stomach ulcers 1995   H.pylori s/p treatment   History of stomach ulcers    Hyperlipidemia    Muscle strain of right gluteal region 01/24/2014   Pneumonia    Prostate cancer (HCC) 2002   prostatectomy   Traumatic  hematoma of buttock 01/24/2014    Past Surgical History:  Procedure Laterality Date   APPENDECTOMY  1963   ruptured appendix/gangrene   BIOPSY  07/06/2019   Procedure: BIOPSY;  Surgeon: Shaaron Lamar HERO, MD;  [facility]: [facility];  Service: Endoscopy;;   CATARACT EXTRACTION W/PHACO Left 04/04/2023   Procedure: CATARACT EXTRACTION PHACO AND INTRAOCULAR LENS PLACEMENT (IOC);  Surgeon: Juli Blunt, MD;  Location: [facility];  Service: Ophthalmology;  Laterality: Left;  CDE  5.20   CATARACT EXTRACTION W/PHACO Right 04/18/2023   Procedure: CATARACT EXTRACTION PHACO AND INTRAOCULAR LENS PLACEMENT (IOC);  Surgeon: Juli Blunt, MD;  Location: [facility];  Service: Ophthalmology;  Laterality: Right;  CDE 12.07   COLONOSCOPY  2013   Dr. Aneita: multiple colon polyps (tubular adenomas), diverticulosis. no further surveillance colonoscopies due to age.   ESOPHAGOGASTRODUODENOSCOPY  8004,8004   Dr. Aneita: gastric ulcer (h.pylori + s/p tx), follow up EGD verified ulcer healing   ESOPHAGOGASTRODUODENOSCOPY N/A 09/26/2016   Procedure: ESOPHAGOGASTRODUODENOSCOPY (EGD);  Surgeon: Lamar HERO Shaaron, MD;  [facility];  Service: Endoscopy;  Laterality: N/A;   ESOPHAGOGASTRODUODENOSCOPY N/A 01/23/2017   Procedure: ESOPHAGOGASTRODUODENOSCOPY (EGD);  Surgeon: Shaaron Lamar HERO, MD;  [facility];  Service: Endoscopy;  Laterality: N/A;  12:15pm   ESOPHAGOGASTRODUODENOSCOPY N/A 07/06/2019   Procedure: ESOPHAGOGASTRODUODENOSCOPY (EGD);  Surgeon: Shaaron Lamar HERO, MD; normal esophagus, nonbleeding gastric ulcer with pigmented material, abnormal gastric mucosa of uncertain significance s/p biopsy, normal duodenum.  Pathology with mild reactive gastropathy, mild chronic gastritis, H. pylori negative.   ESOPHAGOGASTRODUODENOSCOPY N/A 10/20/2019   Procedure: ESOPHAGOGASTRODUODENOSCOPY (EGD);  Surgeon: Shaaron Lamar HERO, MD;  [facility];  Service: Endoscopy;  Laterality: N/A;  9:30am    ESOPHAGOGASTRODUODENOSCOPY N/A 11/19/2023   Surgeon: Shaaron Lamar HERO, MD; 8 mm satellite shaped gastric ulcer.  Gastric biopsies negative for H. pylori   ESOPHAGOGASTRODUODENOSCOPY N/A 03/10/2024   Procedure: EGD (ESOPHAGOGASTRODUODENOSCOPY);  Surgeon: Shaaron Lamar HERO, MD;  [facility];  Service: Endoscopy;  Laterality: N/A;  2:00 pm, asa 3   HEMORRHOID SURGERY  1970   LAPAROSCOPIC CHOLECYSTECTOMY  2003   PROSTATECTOMY  2002   RIGHT HEART CATH AND CORONARY ANGIOGRAPHY N/A 06/10/2024   Procedure: RIGHT HEART CATH AND CORONARY ANGIOGRAPHY;  Surgeon: Verlin Lonni BIRCH, MD;  Location: [facility];  Service: Cardiovascular;  Laterality: N/A;    Social History:  Social History   Tobacco Use  Smoking Status Never  Smokeless Tobacco Never    Social History   Substance and Sexual Activity  Alcohol Use Not Currently   Comment: last drank about 15 years ago     Allergies  Allergen Reactions   Nsaids Other (See Comments)    GI bleed, PUD      Current Outpatient Medications  Medication Sig Dispense Refill   acetaminophen  (TYLENOL ) 500 MG tablet Take 1 tablet (500 mg total) by mouth every 6 (six) hours as needed. 30 tablet 0   ascorbic acid (VITAMIN C) 500 MG tablet Take 500 mg by mouth in the morning.     aspirin  81 MG chewable tablet Chew 1 tablet (81 mg total) by mouth daily with breakfast. 30 tablet 5   Cyanocobalamin (VITAMIN B-12 PO) Take 1 tablet by mouth in the morning.     dapagliflozin  propanediol (FARXIGA ) 10 MG TABS tablet Take 1 tablet (10 mg total) by mouth daily before breakfast. 90 tablet 3   MAGNESIUM  LACTATE PO Take 1 tablet by mouth at bedtime.     metoprolol  tartrate (LOPRESSOR ) 25 MG tablet Take 1 tablet (25 mg total) twice a day.HOLD for blood pressure less than 100 180 tablet 3   Multiple Vitamin (MULTIVITAMIN) tablet Take 1 tablet by mouth in the morning.     pantoprazole  (PROTONIX ) 40 MG tablet Take 1 tablet (40 mg total) by mouth daily.  30 tablet 5   Riboflavin (VITAMIN B-2 PO) Take 1 tablet by mouth daily.     sertraline  (ZOLOFT ) 100 MG tablet Take 1 tablet (100 mg total) by mouth daily. 90 tablet 1   torsemide  (DEMADEX ) 20 MG tablet Take 1 tablet (20 mg total) by mouth daily. (Patient taking differently: Take 20 mg by mouth daily in the afternoon.) 30 tablet 5   traMADol (ULTRAM) 50 MG tablet Take 50 mg by mouth every 6 (six) hours as needed for moderate pain (pain score 4-6).     traZODone  (DESYREL ) 100 MG tablet Take 1 tablet (100 mg total) by mouth at bedtime as needed. for sleep (Patient taking differently: Take 50-100 mg by mouth at bedtime as needed for sleep.) 90 tablet 3   No current facility-administered medications for this visit.    (Not in a hospital admission)   Family History  Problem Relation Age of Onset   Prostate cancer Brother    Prostate cancer Brother  Suicidality Brother    Prostate cancer Brother    Stomach cancer Neg Hx    Colon cancer Neg Hx      Review of Systems:   Review of Systems  Constitutional:  Negative for malaise/fatigue and weight loss.  Respiratory:  Positive for shortness of breath.   Cardiovascular:  Negative for chest pain, palpitations and leg swelling.  Gastrointestinal:  Negative for nausea and vomiting.  Neurological:  Negative for dizziness and headaches.      Physical Exam: BP (!) 93/57   Pulse 63   Ht 6' (1.829 m)   Wt 199 lb (90.3 kg)   SpO2 95% Comment: 2L O2 per [place]  BMI 26.99 kg/m  Physical Exam Constitutional:      General: He is not in acute distress. HENT:     Head: Normocephalic and atraumatic.  Cardiovascular:     Rate and Rhythm: Normal rate and regular rhythm.     Heart sounds: Murmur heard.  Pulmonary:     Effort: Pulmonary effort is normal. No respiratory distress.  Abdominal:     General: There is no distension.     Palpations: Abdomen is soft.  Musculoskeletal:        General: Swelling (Mild bilateral leg edema) present.   Neurological:     Mental Status: He is alert.      Mild leg edema  Cardiac Studies & Procedures   ______________________________________________________________________________________________ CARDIAC CATHETERIZATION  CARDIAC CATHETERIZATION 06/10/2024  Conclusion No angiographic evidence of CAD Mild elevation right heart pressures  Continue workup for TAVR  Findings Coronary Findings Diagnostic  Dominance: Right  Left Anterior Descending Vessel is large.  Left Circumflex Vessel is large.  Right Coronary Artery Vessel is large.  Intervention  No interventions have been documented.     ECHOCARDIOGRAM  ECHOCARDIOGRAM COMPLETE 04/13/2024  Narrative ECHOCARDIOGRAM REPORT    Patient Name:   BERISH BOHMAN Date of Exam: 04/13/2024 Medical Rec #:  [id number]      Height:       72.0 in Accession #:    [id number]     Weight:       198.6 lb Date of Birth:  March 17, 1928       BSA:          2.124 m Patient Age:    89 years       BP:           110/43 mmHg Patient Gender: M              HR:           73 bpm. Exam Location:  [facility]  Procedure: 2D Echo, Cardiac Doppler and Color Doppler (Both Spectral and Color Flow Doppler were utilized during procedure).  Indications:    Congestive Heart Failure I50.9  History:        Patient has prior history of Echocardiogram examinations. CHF, Signs/Symptoms:Systolic Murmur; Risk Factors:Dyslipidemia.  Sonographer:    Aida Pizza RCS Referring Phys: [phone] EJIROGHENE E EMOKPAE  IMPRESSIONS   1. Left ventricular ejection fraction, by estimation, is 60 to 65%. The left ventricle has normal function. Left ventricular endocardial border not optimally defined to evaluate regional wall motion. Left ventricular diastolic function could not be evaluated. 2. Right ventricular systolic function is normal. The right ventricular size is normal. Tricuspid regurgitation signal is inadequate for assessing PA pressure. 3. Left atrial size  was severely dilated. 4. The mitral valve is abnormal. Trivial mitral valve regurgitation. No evidence of mitral stenosis.  Severe mitral annular calcification. 5. The aortic valve is tricuspid. There is severe calcifcation of the aortic valve. There is severe thickening of the aortic valve. Aortic valve regurgitation is mild. Severe aortic valve stenosis. Aortic valve area, by VTI measures 0.85 cm. Aortic valve mean gradient measures 40.7 mmHg. Aortic valve Vmax measures 3.94 m/s. DVI is 0.23. 6. Aortic dilatation noted. There is borderline dilatation of the aortic root, measuring 39 mm.  FINDINGS Left Ventricle: Left ventricular ejection fraction, by estimation, is 60 to 65%. The left ventricle has normal function. Left ventricular endocardial border not optimally defined to evaluate regional wall motion. Strain was performed and the global longitudinal strain is indeterminate. The left ventricular internal cavity size was normal in size. There is no left ventricular hypertrophy. Left ventricular diastolic function could not be evaluated due to mitral annular calcification (moderate or greater). Left ventricular diastolic function could not be evaluated.  Right Ventricle: The right ventricular size is normal. No increase in right ventricular wall thickness. Right ventricular systolic function is normal. Tricuspid regurgitation signal is inadequate for assessing PA pressure.  Left Atrium: Left atrial size was severely dilated.  Right Atrium: Right atrial size was normal in size.  Pericardium: There is no evidence of pericardial effusion.  Mitral Valve: The mitral valve is abnormal. Severe mitral annular calcification. Trivial mitral valve regurgitation. No evidence of mitral valve stenosis.  Tricuspid Valve: The tricuspid valve is normal in structure. Tricuspid valve regurgitation is mild . No evidence of tricuspid stenosis.  Aortic Valve: The aortic valve is tricuspid. There is severe  calcifcation of the aortic valve. There is severe thickening of the aortic valve. Aortic valve regurgitation is mild. Severe aortic stenosis is present. Aortic valve mean gradient measures 40.7 mmHg. Aortic valve peak gradient measures 62.2 mmHg. Aortic valve area, by VTI measures 0.85 cm.  Pulmonic Valve: The pulmonic valve was not well visualized. Pulmonic valve regurgitation is trivial. No evidence of pulmonic stenosis.  Aorta: Aortic dilatation noted. There is borderline dilatation of the aortic root, measuring 39 mm.  Venous: The inferior vena cava was not well visualized.  IAS/Shunts: The interatrial septum was not well visualized.  Additional Comments: 3D was performed not requiring image post processing on an independent workstation and was indeterminate.   LEFT VENTRICLE PLAX 2D LVIDd:         5.05 cm   Diastology LVIDs:         2.90 cm   LV e' medial:    6.53 cm/s LV PW:         1.00 cm   LV E/e' medial:  21.1 LV IVS:        1.10 cm   LV e' lateral:   10.70 cm/s LVOT diam:     2.00 cm   LV E/e' lateral: 12.9 LV SV:         79 LV SV Index:   37 LVOT Area:     3.14 cm   RIGHT VENTRICLE RV S prime:     14.00 cm/s TAPSE (M-mode): 2.6 cm  LEFT ATRIUM              Index        RIGHT ATRIUM           Index LA diam:        4.40 cm  2.07 cm/m   RA Area:     21.20 cm LA Vol (A2C):   124.0 ml 58.37 ml/m  RA Volume:   58.40  ml  27.49 ml/m LA Vol (A4C):   99.6 ml  46.88 ml/m LA Biplane Vol: 112.0 ml 52.72 ml/m AORTIC VALVE AV Area (Vmax):    0.83 cm AV Area (Vmean):   0.78 cm AV Area (VTI):     0.85 cm AV Vmax:           394.33 cm/s AV Vmean:          308.333 cm/s AV VTI:            0.933 m AV Peak Grad:      62.2 mmHg AV Mean Grad:      40.7 mmHg LVOT Vmax:         103.87 cm/s LVOT Vmean:        76.467 cm/s LVOT VTI:          0.251 m LVOT/AV VTI ratio: 0.27  AORTA Ao Root diam: 3.90 cm  MITRAL VALVE MV Area (PHT): 4.80 cm     SHUNTS MV Decel Time: 158  msec     Systemic VTI:  0.25 m MV E velocity: 138.00 cm/s  Systemic Diam: 2.00 cm MV A velocity: 96.40 cm/s MV E/A ratio:  1.43  Vishnu Priya Mallipeddi Electronically signed by Diannah Late Mallipeddi Signature Date/Time: 04/13/2024/4:39:57 PM    Final    MONITORS  LONG TERM MONITOR (3-14 DAYS) 05/14/2024  Narrative   Patch wear time was for 13 days and 23 hours.   Normal sinus rhythm predominantly ranging from 56 to 190 bpm with an average HR 82 bpm.   145 runs of nonsustained SVT occurred, fastest interval lasting 4 beats and the longest lasting 26.9 seconds.   15 runs of NSVT occurred, fastest interval lasting 4 beats and the longest lasting 5 beats.   No evidence of atrial fibrillation/flutter, high-grade AV block or pauses.   6.9% PAC burden and 17.8% PVC burden.   No patient triggered events noted.   CT SCANS  CT CORONARY MORPH W/CTA COR W/SCORE 06/18/2024  Addendum 06/22/2024 12:41 AM ADDENDUM REPORT: 06/22/2024 00:38  EXAM: OVER-READ INTERPRETATION  CT CHEST  The following report is an over-read performed by radiologist Dr. Morgane Naveauof [facility] on 06/22/2024. This over-read does not include interpretation of cardiac or coronary anatomy or pathology. The coronary morphology CTA and calcium  score interpretation by the cardiologist is attached.  COMPARISON:  CT chest 06/18/2024  FINDINGS: Bilateral trace pleural effusions. Calcified left lower lobe pulmonary micronodule-likely granuloma. No pulmonary embolus of the partially visualized pulmonary arteries. Bilateral atelectasis and scarring.  No lymphadenopathy. Calcified left hilar and mediastinal lymph nodes likely sequelae of prior granulomatous disease.  No acute upper abdominal abnormality. Status post cholecystectomy. Calcifications of the splenic and hepatic parenchyma suggestive of sequelae of prior granulomatous disease.  No acute osseous abnormality.  No acute soft tissue  abnormality.  IMPRESSION: Persistent bilateral trace pleural effusions.   Electronically Signed By: Morgane  Naveau M.D. On: 06/22/2024 00:38  Narrative CLINICAL DATA:  Severe Aortic Stenosis.  EXAM: Cardiac TAVR CT  TECHNIQUE: A non-contrast, gated CT scan was obtained with axial slices of 2.5 mm through the heart for aortic valve scoring. A 100 kV retrospective, gated, contrast cardiac scan was obtained. Gantry rotation speed was 230 msec and collimation was 0.63 mm. Nitroglycerin was not given. The 3D dataset was reconstructed in systole with motion correction. The 3D data set was reconstructed in 5% intervals of the 0-95% of the R-R cycle. Systolic and diastolic phases were analyzed on a dedicated workstation  using MPR, MIP, and VRT modes. The patient received 100 cc of contrast.  FINDINGS: Image quality: Average.  Noise artifact is: Limited.  Valve Morphology: Tricuspid aortic valve with severe diffuse severe calcifications.  Aortic Valve Calcium  score: 4188  Aortic annular dimension:  Phase assessed: 25%  Annular area: 540 mm2  Annular perimeter: 84.3 mm  Max diameter: 29.4 mm  Min diameter: 22.9 mm  Annular and subannular calcification: Mild annular calcium  under the LCC.  Membranous septum length: 13.0 mm  Optimal coplanar projection: RAO 6 CAU 14  Coronary Artery Height above Annulus:  Left Main: 10.3 mm  Right Coronary: 21.7 mm  Sinus of Valsalva Measurements:  Non-coronary: 35 mm  Right-coronary: 33 mm  Left-coronary: 33 mm  Sinus of Valsalva Height:  Non-coronary: 23.2 mm  Right-coronary: 27.1 mm  Left-coronary: 20.4 mm  Sinotubular Junction: 32 mm  Ascending Thoracic Aorta: 36 mm  Coronary Arteries: Normal coronary origin. Right dominance. The study was performed without use of NTG and is insufficient for plaque evaluation. Please refer to recent cardiac catheterization for coronary assessment.  Cardiac  Morphology:  Right Atrium: Right atrial size is within normal limits.  Right Ventricle: The right ventricular cavity is dilated.  Left Atrium: Left atrial size is dilated with no left atrial appendage filling defect. 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  Left Ventricle: The ventricular cavity size is within normal limits.  Pulmonary arteries: Dilated pulmonary artery suggestive of pulmonary hypertension.  Pulmonary veins: Normal pulmonary venous drainage.  Pericardium: Normal thickness with no significant effusion or calcium  present.  Mitral Valve: The mitral valve is normal structure with mild annular calcium .  Extra-cardiac findings: See attached radiology report for non-cardiac structures.  IMPRESSION: 1. Tricuspid aortic valve with severe aortic stenosis.  2. Annular measurements are in between a 26/29 mm S3. Measurements do support a 24 mm Evolut Pro.  3. No significant annular or subannular calcifications.  4. Shallow left main height (10.1 mm).  Large sinuses noted.  5. Optimal Fluoroscopic Angle for Delivery: RAO 6 CAU 14  6. Dilated pulmonary artery suggestive of pulmonary hypertension.  7. 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  Darryle T. Barbaraann, MD  Electronically Signed: By: Darryle Barbaraann M.D. On: 06/20/2024 20:28     ______________________________________________________________________________________________      ECG NSR    I have independently reviewed the above radiologic studies and discussed with the patient   Recent Lab Findings: Lab Results  Component Value Date   WBC 8.8 06/03/2024   HGB 11.6 (L) 06/10/2024   HGB 11.6 (L) 06/10/2024   HCT 34.0 (L) 06/10/2024   HCT 34.0 (L) 06/10/2024   PLT 212 06/03/2024   GLUCOSE 109 (H) 06/03/2024   CHOL 165 10/08/2023   TRIG 95 10/08/2023   HDL 64 10/08/2023   LDLCALC 84 10/08/2023   ALT 24 05/17/2024   AST 25 05/17/2024   NA 138 06/10/2024   NA 138 06/10/2024   K 4.2 06/10/2024   K 4.2 06/10/2024    CL 97 06/03/2024   CREATININE 0.88 06/03/2024   BUN 38 (H) 06/03/2024   CO2 30 (H) 06/03/2024   TSH 0.968 04/12/2024   INR 1.1 11/19/2023   HGBA1C 5.6 04/21/2024      Assessment / Plan:   89 y.o. male with severe aortic stenosis.  STS score: 4.15%.  NYHA Class II.  The risks and benefits of transfemoral TAVR were discussed in detail.  The risks included death, stroke, paravalvular leak, aortic dissection, annulus rupture, device embolization,  acute myocardial infarction, arrhythmia, heart block or need for permanent pacemaker.  We also discussed possibility of an emergent sternotomy to address any procedural complications.  Based on our discussion, we collectively decided that an emergent sternotomy would not be indicated.  The patient is agreeable to proceed.    On his CT scan, his left coronary is low but his sinuses are big.  He also has calcium  below the LCC extending into the LVOT, but that should not be prohibitive for TAVR.  Based on my review of his LHC, echo, and CTA, I agree with the multidisciplinary plan to proceed with a transfemoral 26 mm S3 TAVR.   I  spent 30 minutes counseling the patient face to face.   Con RAMAN Tyquavious Gamel 07/08/2024 11:09 AM

## 2024-07-08 NOTE — Progress Notes (Signed)
 [facility]   MULTIDISCIPLINARY HEART VALVE CLINIC    BURL TAUZIN [facility] Medical Record #[id number] Date of Birth: 01/29/1928  Referring: Stacia Diannah SQUIBB, MD Primary Care: Dettinger, Fonda LABOR, MD Primary Cardiologist:Vishnu SQUIBB Stacia, MD  Chief Complaint:    Chief Complaint  Patient presents with   Aortic Stenosis    TAVR consult    History of Present Illness:     LAVELL SUPPLE is a 89 y.o. male presents for evaluation of severe aortic stenosis and TAVR.  He has a history of chronic diastolic HF, PVCs, COPD, GERD and prostate cancer.  He also has severe AS and was admitted to [facility] in August with respiratory failure due to acute CHF.  He has since been on Torsemide  and feels much better.    Overall he feels well.  His breathing is much better as is his leg swelling.  He denies chest pain, palpitations, syncope or near syncope.  He has a history of asbestos exposure, no history of chest surgery or radiation.  He lives by himself but it's on his daughter's property who's about 50 ft away.  His son lives across the street.  He is active, enjoys gardening and is going dancing tonight.  He uses 2L [place] at all times and uses a walker for ambulation.  Of note, he is DNR but will rescind for the TAVR.  My measurements: Annulus: Area 516, perimeter 82.4 RCA 15.6, LCA 11.8 - big chunk of calcium  below LCA in the LVOT SOV: 33-35 Femoral arteries:  Poorly opacified on scan but appear capacious.   Past Medical History:  Diagnosis Date   Aortic stenosis    Asbestos exposure    Blood transfusion without reported diagnosis    after ruptured appendix 1963   Diverticulosis    Gastric ulcer 06/2019   GERD (gastroesophageal reflux disease)    History of stomach ulcers 1995   H.pylori s/p treatment   History of stomach ulcers    Hyperlipidemia    Muscle strain of right gluteal region 01/24/2014   Pneumonia    Prostate cancer (HCC) 2002   prostatectomy   Traumatic  hematoma of buttock 01/24/2014    Past Surgical History:  Procedure Laterality Date   APPENDECTOMY  1963   ruptured appendix/gangrene   BIOPSY  07/06/2019   Procedure: BIOPSY;  Surgeon: Shaaron Lamar HERO, MD;  [facility];  Service: Endoscopy;;   CATARACT EXTRACTION W/PHACO Left 04/04/2023   Procedure: CATARACT EXTRACTION PHACO AND INTRAOCULAR LENS PLACEMENT (IOC);  Surgeon: Juli Blunt, MD;  Location: [facility] OR[facility];  Service: Ophthalmology;  Laterality: Left;  CDE  5.20   CATARACT EXTRACTION W/PHACO Right 04/18/2023   Procedure: CATARACT EXTRACTION PHACO AND INTRAOCULAR LENS PLACEMENT (IOC);  Surgeon: Juli Blunt, MD;  Location: [facility];  Service: Ophthalmology;  Laterality: Right;  CDE 12.07   COLONOSCOPY  2013   Dr. Aneita: multiple colon polyps (tubular adenomas), diverticulosis. no further surveillance colonoscopies due to age.   ESOPHAGOGASTRODUODENOSCOPY  8004,8004   Dr. Aneita: gastric ulcer (h.pylori + s/p tx), follow up EGD verified ulcer healing   ESOPHAGOGASTRODUODENOSCOPY N/A 09/26/2016   Procedure: ESOPHAGOGASTRODUODENOSCOPY (EGD);  Surgeon: Lamar HERO Shaaron, MD;  [facility];  Service: Endoscopy;  Laterality: N/A;   ESOPHAGOGASTRODUODENOSCOPY N/A 01/23/2017   Procedure: ESOPHAGOGASTRODUODENOSCOPY (EGD);  Surgeon: Shaaron Lamar HERO, MD;  [facility];  Service: Endoscopy;  Laterality: N/A;  12:15pm   ESOPHAGOGASTRODUODENOSCOPY N/A 07/06/2019   Procedure: ESOPHAGOGASTRODUODENOSCOPY (EGD);  Surgeon: Shaaron Lamar HERO, MD; normal esophagus, nonbleeding gastric ulcer with pigmented material, abnormal gastric mucosa of uncertain significance s/p biopsy, normal duodenum.  Pathology with mild reactive gastropathy, mild chronic gastritis, H. pylori negative.   ESOPHAGOGASTRODUODENOSCOPY N/A 10/20/2019   Procedure: ESOPHAGOGASTRODUODENOSCOPY (EGD);  Surgeon: Shaaron Lamar HERO, MD;  [facility];  Service: Endoscopy;  Laterality: N/A;  9:30am    ESOPHAGOGASTRODUODENOSCOPY N/A 11/19/2023   Surgeon: Shaaron Lamar HERO, MD; 8 mm satellite shaped gastric ulcer.  Gastric biopsies negative for H. pylori   ESOPHAGOGASTRODUODENOSCOPY N/A 03/10/2024   Procedure: EGD (ESOPHAGOGASTRODUODENOSCOPY);  Surgeon: Shaaron Lamar HERO, MD;  [facility];  Service: Endoscopy;  Laterality: N/A;  2:00 pm, asa 3   HEMORRHOID SURGERY  1970   LAPAROSCOPIC CHOLECYSTECTOMY  2003   PROSTATECTOMY  2002   RIGHT HEART CATH AND CORONARY ANGIOGRAPHY N/A 06/10/2024   Procedure: RIGHT HEART CATH AND CORONARY ANGIOGRAPHY;  Surgeon: Verlin Lonni BIRCH, MD;  Location: [facility];  Service: Cardiovascular;  Laterality: N/A;    Social History:  Social History   Tobacco Use  Smoking Status Never  Smokeless Tobacco Never    Social History   Substance and Sexual Activity  Alcohol Use Not Currently   Comment: last drank about 15 years ago     Allergies  Allergen Reactions   Nsaids Other (See Comments)    GI bleed, PUD      Current Outpatient Medications  Medication Sig Dispense Refill   acetaminophen  (TYLENOL ) 500 MG tablet Take 1 tablet (500 mg total) by mouth every 6 (six) hours as needed. 30 tablet 0   ascorbic acid (VITAMIN C) 500 MG tablet Take 500 mg by mouth in the morning.     aspirin  81 MG chewable tablet Chew 1 tablet (81 mg total) by mouth daily with breakfast. 30 tablet 5   Cyanocobalamin (VITAMIN B-12 PO) Take 1 tablet by mouth in the morning.     dapagliflozin  propanediol (FARXIGA ) 10 MG TABS tablet Take 1 tablet (10 mg total) by mouth daily before breakfast. 90 tablet 3   MAGNESIUM  LACTATE PO Take 1 tablet by mouth at bedtime.     metoprolol  tartrate (LOPRESSOR ) 25 MG tablet Take 1 tablet (25 mg total) twice a day.HOLD for blood pressure less than 100 180 tablet 3   Multiple Vitamin (MULTIVITAMIN) tablet Take 1 tablet by mouth in the morning.     pantoprazole  (PROTONIX ) 40 MG tablet Take 1 tablet (40 mg total) by mouth daily.  30 tablet 5   Riboflavin (VITAMIN B-2 PO) Take 1 tablet by mouth daily.     sertraline  (ZOLOFT ) 100 MG tablet Take 1 tablet (100 mg total) by mouth daily. 90 tablet 1   torsemide  (DEMADEX ) 20 MG tablet Take 1 tablet (20 mg total) by mouth daily. (Patient taking differently: Take 20 mg by mouth daily in the afternoon.) 30 tablet 5   traMADol (ULTRAM) 50 MG tablet Take 50 mg by mouth every 6 (six) hours as needed for moderate pain (pain score 4-6).     traZODone  (DESYREL ) 100 MG tablet Take 1 tablet (100 mg total) by mouth at bedtime as needed. for sleep (Patient taking differently: Take 50-100 mg by mouth at bedtime as needed for sleep.) 90 tablet 3   No current facility-administered medications for this visit.    (Not in a hospital admission)   Family History  Problem Relation Age of Onset   Prostate cancer Brother    Prostate cancer Brother  Suicidality Brother    Prostate cancer Brother    Stomach cancer Neg Hx    Colon cancer Neg Hx      Review of Systems:   Review of Systems  Constitutional:  Negative for malaise/fatigue and weight loss.  Respiratory:  Positive for shortness of breath.   Cardiovascular:  Negative for chest pain, palpitations and leg swelling.  Gastrointestinal:  Negative for nausea and vomiting.  Neurological:  Negative for dizziness and headaches.      Physical Exam: BP (!) 93/57   Pulse 63   Ht 6' (1.829 m)   Wt 199 lb (90.3 kg)   SpO2 95% Comment: 2L O2 per [place]  BMI 26.99 kg/m  Physical Exam Constitutional:      General: He is not in acute distress. HENT:     Head: Normocephalic and atraumatic.  Cardiovascular:     Rate and Rhythm: Normal rate and regular rhythm.     Heart sounds: Murmur heard.  Pulmonary:     Effort: Pulmonary effort is normal. No respiratory distress.  Abdominal:     General: There is no distension.     Palpations: Abdomen is soft.  Musculoskeletal:        General: Swelling (Mild bilateral leg edema) present.   Neurological:     Mental Status: He is alert.      Mild leg edema  Cardiac Studies & Procedures   ______________________________________________________________________________________________ CARDIAC CATHETERIZATION  CARDIAC CATHETERIZATION 06/10/2024  Conclusion No angiographic evidence of CAD Mild elevation right heart pressures  Continue workup for TAVR  Findings Coronary Findings Diagnostic  Dominance: Right  Left Anterior Descending Vessel is large.  Left Circumflex Vessel is large.  Right Coronary Artery Vessel is large.  Intervention  No interventions have been documented.     ECHOCARDIOGRAM  ECHOCARDIOGRAM COMPLETE 04/13/2024  Narrative ECHOCARDIOGRAM REPORT    Patient Name:   BERISH BOHMAN Date of Exam: 04/13/2024 Medical Rec #:  [id number]      Height:       72.0 in Accession #:    [id number]     Weight:       198.6 lb Date of Birth:  March 17, 1928       BSA:          2.124 m Patient Age:    89 years       BP:           110/43 mmHg Patient Gender: M              HR:           73 bpm. Exam Location:  [facility]  Procedure: 2D Echo, Cardiac Doppler and Color Doppler (Both Spectral and Color Flow Doppler were utilized during procedure).  Indications:    Congestive Heart Failure I50.9  History:        Patient has prior history of Echocardiogram examinations. CHF, Signs/Symptoms:Systolic Murmur; Risk Factors:Dyslipidemia.  Sonographer:    Aida Pizza RCS Referring Phys: [phone] EJIROGHENE E EMOKPAE  IMPRESSIONS   1. Left ventricular ejection fraction, by estimation, is 60 to 65%. The left ventricle has normal function. Left ventricular endocardial border not optimally defined to evaluate regional wall motion. Left ventricular diastolic function could not be evaluated. 2. Right ventricular systolic function is normal. The right ventricular size is normal. Tricuspid regurgitation signal is inadequate for assessing PA pressure. 3. Left atrial size  was severely dilated. 4. The mitral valve is abnormal. Trivial mitral valve regurgitation. No evidence of mitral stenosis.  Severe mitral annular calcification. 5. The aortic valve is tricuspid. There is severe calcifcation of the aortic valve. There is severe thickening of the aortic valve. Aortic valve regurgitation is mild. Severe aortic valve stenosis. Aortic valve area, by VTI measures 0.85 cm. Aortic valve mean gradient measures 40.7 mmHg. Aortic valve Vmax measures 3.94 m/s. DVI is 0.23. 6. Aortic dilatation noted. There is borderline dilatation of the aortic root, measuring 39 mm.  FINDINGS Left Ventricle: Left ventricular ejection fraction, by estimation, is 60 to 65%. The left ventricle has normal function. Left ventricular endocardial border not optimally defined to evaluate regional wall motion. Strain was performed and the global longitudinal strain is indeterminate. The left ventricular internal cavity size was normal in size. There is no left ventricular hypertrophy. Left ventricular diastolic function could not be evaluated due to mitral annular calcification (moderate or greater). Left ventricular diastolic function could not be evaluated.  Right Ventricle: The right ventricular size is normal. No increase in right ventricular wall thickness. Right ventricular systolic function is normal. Tricuspid regurgitation signal is inadequate for assessing PA pressure.  Left Atrium: Left atrial size was severely dilated.  Right Atrium: Right atrial size was normal in size.  Pericardium: There is no evidence of pericardial effusion.  Mitral Valve: The mitral valve is abnormal. Severe mitral annular calcification. Trivial mitral valve regurgitation. No evidence of mitral valve stenosis.  Tricuspid Valve: The tricuspid valve is normal in structure. Tricuspid valve regurgitation is mild . No evidence of tricuspid stenosis.  Aortic Valve: The aortic valve is tricuspid. There is severe  calcifcation of the aortic valve. There is severe thickening of the aortic valve. Aortic valve regurgitation is mild. Severe aortic stenosis is present. Aortic valve mean gradient measures 40.7 mmHg. Aortic valve peak gradient measures 62.2 mmHg. Aortic valve area, by VTI measures 0.85 cm.  Pulmonic Valve: The pulmonic valve was not well visualized. Pulmonic valve regurgitation is trivial. No evidence of pulmonic stenosis.  Aorta: Aortic dilatation noted. There is borderline dilatation of the aortic root, measuring 39 mm.  Venous: The inferior vena cava was not well visualized.  IAS/Shunts: The interatrial septum was not well visualized.  Additional Comments: 3D was performed not requiring image post processing on an independent workstation and was indeterminate.   LEFT VENTRICLE PLAX 2D LVIDd:         5.05 cm   Diastology LVIDs:         2.90 cm   LV e' medial:    6.53 cm/s LV PW:         1.00 cm   LV E/e' medial:  21.1 LV IVS:        1.10 cm   LV e' lateral:   10.70 cm/s LVOT diam:     2.00 cm   LV E/e' lateral: 12.9 LV SV:         79 LV SV Index:   37 LVOT Area:     3.14 cm   RIGHT VENTRICLE RV S prime:     14.00 cm/s TAPSE (M-mode): 2.6 cm  LEFT ATRIUM              Index        RIGHT ATRIUM           Index LA diam:        4.40 cm  2.07 cm/m   RA Area:     21.20 cm LA Vol (A2C):   124.0 ml 58.37 ml/m  RA Volume:   58.40  ml  27.49 ml/m LA Vol (A4C):   99.6 ml  46.88 ml/m LA Biplane Vol: 112.0 ml 52.72 ml/m AORTIC VALVE AV Area (Vmax):    0.83 cm AV Area (Vmean):   0.78 cm AV Area (VTI):     0.85 cm AV Vmax:           394.33 cm/s AV Vmean:          308.333 cm/s AV VTI:            0.933 m AV Peak Grad:      62.2 mmHg AV Mean Grad:      40.7 mmHg LVOT Vmax:         103.87 cm/s LVOT Vmean:        76.467 cm/s LVOT VTI:          0.251 m LVOT/AV VTI ratio: 0.27  AORTA Ao Root diam: 3.90 cm  MITRAL VALVE MV Area (PHT): 4.80 cm     SHUNTS MV Decel Time: 158  msec     Systemic VTI:  0.25 m MV E velocity: 138.00 cm/s  Systemic Diam: 2.00 cm MV A velocity: 96.40 cm/s MV E/A ratio:  1.43  Vishnu Priya Mallipeddi Electronically signed by Diannah Late Mallipeddi Signature Date/Time: 04/13/2024/4:39:57 PM    Final    MONITORS  LONG TERM MONITOR (3-14 DAYS) 05/14/2024  Narrative   Patch wear time was for 13 days and 23 hours.   Normal sinus rhythm predominantly ranging from 56 to 190 bpm with an average HR 82 bpm.   145 runs of nonsustained SVT occurred, fastest interval lasting 4 beats and the longest lasting 26.9 seconds.   15 runs of NSVT occurred, fastest interval lasting 4 beats and the longest lasting 5 beats.   No evidence of atrial fibrillation/flutter, high-grade AV block or pauses.   6.9% PAC burden and 17.8% PVC burden.   No patient triggered events noted.   CT SCANS  CT CORONARY MORPH W/CTA COR W/SCORE 06/18/2024  Addendum 06/22/2024 12:41 AM ADDENDUM REPORT: 06/22/2024 00:38  EXAM: OVER-READ INTERPRETATION  CT CHEST  The following report is an over-read performed by radiologist Dr. Morgane Naveauof [facility] on 06/22/2024. This over-read does not include interpretation of cardiac or coronary anatomy or pathology. The coronary morphology CTA and calcium  score interpretation by the cardiologist is attached.  COMPARISON:  CT chest 06/18/2024  FINDINGS: Bilateral trace pleural effusions. Calcified left lower lobe pulmonary micronodule-likely granuloma. No pulmonary embolus of the partially visualized pulmonary arteries. Bilateral atelectasis and scarring.  No lymphadenopathy. Calcified left hilar and mediastinal lymph nodes likely sequelae of prior granulomatous disease.  No acute upper abdominal abnormality. Status post cholecystectomy. Calcifications of the splenic and hepatic parenchyma suggestive of sequelae of prior granulomatous disease.  No acute osseous abnormality.  No acute soft tissue  abnormality.  IMPRESSION: Persistent bilateral trace pleural effusions.   Electronically Signed By: Morgane  Naveau M.D. On: 06/22/2024 00:38  Narrative CLINICAL DATA:  Severe Aortic Stenosis.  EXAM: Cardiac TAVR CT  TECHNIQUE: A non-contrast, gated CT scan was obtained with axial slices of 2.5 mm through the heart for aortic valve scoring. A 100 kV retrospective, gated, contrast cardiac scan was obtained. Gantry rotation speed was 230 msec and collimation was 0.63 mm. Nitroglycerin was not given. The 3D dataset was reconstructed in systole with motion correction. The 3D data set was reconstructed in 5% intervals of the 0-95% of the R-R cycle. Systolic and diastolic phases were analyzed on a dedicated workstation  using MPR, MIP, and VRT modes. The patient received 100 cc of contrast.  FINDINGS: Image quality: Average.  Noise artifact is: Limited.  Valve Morphology: Tricuspid aortic valve with severe diffuse severe calcifications.  Aortic Valve Calcium  score: 4188  Aortic annular dimension:  Phase assessed: 25%  Annular area: 540 mm2  Annular perimeter: 84.3 mm  Max diameter: 29.4 mm  Min diameter: 22.9 mm  Annular and subannular calcification: Mild annular calcium  under the LCC.  Membranous septum length: 13.0 mm  Optimal coplanar projection: RAO 6 CAU 14  Coronary Artery Height above Annulus:  Left Main: 10.3 mm  Right Coronary: 21.7 mm  Sinus of Valsalva Measurements:  Non-coronary: 35 mm  Right-coronary: 33 mm  Left-coronary: 33 mm  Sinus of Valsalva Height:  Non-coronary: 23.2 mm  Right-coronary: 27.1 mm  Left-coronary: 20.4 mm  Sinotubular Junction: 32 mm  Ascending Thoracic Aorta: 36 mm  Coronary Arteries: Normal coronary origin. Right dominance. The study was performed without use of NTG and is insufficient for plaque evaluation. Please refer to recent cardiac catheterization for coronary assessment.  Cardiac  Morphology:  Right Atrium: Right atrial size is within normal limits.  Right Ventricle: The right ventricular cavity is dilated.  Left Atrium: Left atrial size is dilated with no left atrial appendage filling defect. 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  Left Ventricle: The ventricular cavity size is within normal limits.  Pulmonary arteries: Dilated pulmonary artery suggestive of pulmonary hypertension.  Pulmonary veins: Normal pulmonary venous drainage.  Pericardium: Normal thickness with no significant effusion or calcium  present.  Mitral Valve: The mitral valve is normal structure with mild annular calcium .  Extra-cardiac findings: See attached radiology report for non-cardiac structures.  IMPRESSION: 1. Tricuspid aortic valve with severe aortic stenosis.  2. Annular measurements are in between a 26/29 mm S3. Measurements do support a 24 mm Evolut Pro.  3. No significant annular or subannular calcifications.  4. Shallow left main height (10.1 mm).  Large sinuses noted.  5. Optimal Fluoroscopic Angle for Delivery: RAO 6 CAU 14  6. Dilated pulmonary artery suggestive of pulmonary hypertension.  7. 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  Darryle T. Barbaraann, MD  Electronically Signed: By: Darryle Barbaraann M.D. On: 06/20/2024 20:28     ______________________________________________________________________________________________      ECG NSR    I have independently reviewed the above radiologic studies and discussed with the patient   Recent Lab Findings: Lab Results  Component Value Date   WBC 8.8 06/03/2024   HGB 11.6 (L) 06/10/2024   HGB 11.6 (L) 06/10/2024   HCT 34.0 (L) 06/10/2024   HCT 34.0 (L) 06/10/2024   PLT 212 06/03/2024   GLUCOSE 109 (H) 06/03/2024   CHOL 165 10/08/2023   TRIG 95 10/08/2023   HDL 64 10/08/2023   LDLCALC 84 10/08/2023   ALT 24 05/17/2024   AST 25 05/17/2024   NA 138 06/10/2024   NA 138 06/10/2024   K 4.2 06/10/2024   K 4.2 06/10/2024    CL 97 06/03/2024   CREATININE 0.88 06/03/2024   BUN 38 (H) 06/03/2024   CO2 30 (H) 06/03/2024   TSH 0.968 04/12/2024   INR 1.1 11/19/2023   HGBA1C 5.6 04/21/2024      Assessment / Plan:   89 y.o. male with severe aortic stenosis.  STS score: 4.15%.  NYHA Class II.  The risks and benefits of transfemoral TAVR were discussed in detail.  The risks included death, stroke, paravalvular leak, aortic dissection, annulus rupture, device embolization,  acute myocardial infarction, arrhythmia, heart block or need for permanent pacemaker.  We also discussed possibility of an emergent sternotomy to address any procedural complications.  Based on our discussion, we collectively decided that an emergent sternotomy would not be indicated.  The patient is agreeable to proceed.    On his CT scan, his left coronary is low but his sinuses are big.  He also has calcium  below the LCC extending into the LVOT, but that should not be prohibitive for TAVR.  Based on my review of his LHC, echo, and CTA, I agree with the multidisciplinary plan to proceed with a transfemoral 26 mm S3 TAVR.   I  spent 30 minutes counseling the patient face to face.   Con RAMAN Tyquavious Gamel 07/08/2024 11:09 AM

## 2024-07-09 ENCOUNTER — Ambulatory Visit: Admitting: Thoracic Surgery (Cardiothoracic Vascular Surgery)

## 2024-07-09 ENCOUNTER — Ambulatory Visit: Admitting: Student

## 2024-07-14 ENCOUNTER — Other Ambulatory Visit: Payer: Self-pay

## 2024-07-14 DIAGNOSIS — I350 Nonrheumatic aortic (valve) stenosis: Secondary | ICD-10-CM

## 2024-07-21 ENCOUNTER — Encounter: Admitting: Surgery

## 2024-07-23 ENCOUNTER — Ambulatory Visit (HOSPITAL_COMMUNITY)
Admission: RE | Admit: 2024-07-23 | Discharge: 2024-07-23 | Disposition: A | Source: Ambulatory Visit | Attending: Cardiovascular Disease | Admitting: Cardiovascular Disease

## 2024-07-23 ENCOUNTER — Other Ambulatory Visit: Payer: Self-pay

## 2024-07-23 ENCOUNTER — Encounter (HOSPITAL_COMMUNITY)
Admission: RE | Admit: 2024-07-23 | Discharge: 2024-07-23 | Disposition: A | Discharge status: Home / Self Care | Source: Ambulatory Visit | Attending: Cardiovascular Disease | Admitting: Cardiovascular Disease

## 2024-07-23 DIAGNOSIS — I350 Nonrheumatic aortic (valve) stenosis: Secondary | ICD-10-CM | POA: Insufficient documentation

## 2024-07-23 DIAGNOSIS — Z01818 Encounter for other preprocedural examination: Secondary | ICD-10-CM

## 2024-07-23 DIAGNOSIS — I493 Ventricular premature depolarization: Secondary | ICD-10-CM | POA: Insufficient documentation

## 2024-07-23 LAB — URINALYSIS, ROUTINE W REFLEX MICROSCOPIC
Bilirubin Urine: NEGATIVE
Glucose, UA: 50 mg/dL — AB
Hgb urine dipstick: NEGATIVE
Ketones, ur: NEGATIVE mg/dL
Leukocytes,Ua: NEGATIVE
Nitrite: NEGATIVE
Protein, ur: NEGATIVE mg/dL
Specific Gravity, Urine: 1.006 (ref 1.005–1.030)
pH: 7 (ref 5.0–8.0)

## 2024-07-23 LAB — COMPREHENSIVE METABOLIC PANEL WITH GFR
ALT: 21 U/L (ref 0–44)
AST: 26 U/L (ref 15–41)
Albumin: 3.6 g/dL (ref 3.5–5.0)
Alkaline Phosphatase: 78 U/L (ref 38–126)
Anion gap: 12 (ref 5–15)
BUN: 18 mg/dL (ref 8–23)
CO2: 33 mmol/L — ABNORMAL HIGH (ref 22–32)
Calcium: 9 mg/dL (ref 8.9–10.3)
Chloride: 94 mmol/L — ABNORMAL LOW (ref 98–111)
Creatinine, Ser: 0.98 mg/dL (ref 0.61–1.24)
GFR, Estimated: >60 mL/min (ref >60)
Glucose, Bld: 110 mg/dL — ABNORMAL HIGH (ref 70–99)
Potassium: 3.7 mmol/L (ref 3.5–5.1)
Sodium: 139 mmol/L (ref 135–145)
Total Bilirubin: 1 mg/dL (ref 0.0–1.2)
Total Protein: 6.7 g/dL (ref 6.5–8.1)

## 2024-07-23 LAB — SURGICAL PCR SCREEN
MRSA, PCR: NEGATIVE
Staphylococcus aureus: NEGATIVE

## 2024-07-23 LAB — CBC
HCT: 36.8 % — ABNORMAL LOW (ref 39.0–52.0)
Hemoglobin: 10.9 g/dL — ABNORMAL LOW (ref 13.0–17.0)
MCH: 26 pg (ref 26.0–34.0)
MCHC: 29.6 g/dL — ABNORMAL LOW (ref 30.0–36.0)
MCV: 87.6 fL (ref 80.0–100.0)
Platelets: 172 K/uL (ref 150–400)
RBC: 4.2 MIL/uL — ABNORMAL LOW (ref 4.22–5.81)
RDW: 16 % — ABNORMAL HIGH (ref 11.5–15.5)
WBC: 9.7 K/uL (ref 4.0–10.5)
nRBC: 0 % (ref 0.0–0.2)

## 2024-07-23 LAB — TYPE AND SCREEN
ABO/RH(D): O POS
Antibody Screen: NEGATIVE

## 2024-07-23 LAB — PROTIME-INR
INR: 1 (ref 0.8–1.2)
Prothrombin Time: 14 s (ref 11.4–15.2)

## 2024-07-23 NOTE — Progress Notes (Addendum)
 All consents signed by patient at PAT lab appointment. Pt was sent home with printed copy of surgical instructions and CHG soap/CHG soap instructions. All instructions reviewed with patient and patient's son, Tyron and questions answered.  Patients chart send to anesthesia for review. Pt denies any respiratory illness/infection in the last two months.   Pt was wearing 2L [place] of oxygen at lab appointment. He has not had to increase oxygen needs for SOB.

## 2024-07-26 MED ORDER — CEFAZOLIN SODIUM-DEXTROSE 2-4 GM/100ML-% IV SOLN
2.0000 g | INTRAVENOUS | Status: DC
  Filled 2024-07-26: qty 100

## 2024-07-26 MED ORDER — HEPARIN 30,000 UNITS/1000 ML (OHS) CELLSAVER SOLUTION
Status: DC
  Filled 2024-07-26: qty 1000

## 2024-07-26 MED ORDER — MAGNESIUM SULFATE 50 % IJ SOLN
40.0000 meq | INTRAMUSCULAR | Status: DC
  Filled 2024-07-26: qty 9.85

## 2024-07-26 MED ORDER — NOREPINEPHRINE 4 MG/250ML-% IV SOLN
0.0000 ug/min | INTRAVENOUS | Status: AC
  Administered 2024-07-27: 2 ug/min via INTRAVENOUS
  Filled 2024-07-26: qty 250

## 2024-07-26 MED ORDER — POTASSIUM CHLORIDE 2 MEQ/ML IV SOLN
80.0000 meq | INTRAVENOUS | Status: DC
  Filled 2024-07-26: qty 40

## 2024-07-26 MED ORDER — DEXMEDETOMIDINE HCL IN NACL 400 MCG/100ML IV SOLN
0.1000 ug/kg/h | INTRAVENOUS | Status: AC
  Administered 2024-07-27: 90.32 ug via INTRAVENOUS
  Administered 2024-07-27: 1 ug/kg/h via INTRAVENOUS
  Filled 2024-07-26: qty 100

## 2024-07-26 NOTE — Anesthesia Preprocedure Evaluation (Signed)
 Anesthesia Evaluation  Patient identified by MRN, date of birth, ID band Patient awake    Reviewed: Allergy & Precautions, NPO status , Patient's Chart, lab work & pertinent test results, reviewed documented beta blocker date and time   History of Anesthesia Complications Negative for: history of anesthetic complications  Airway Mallampati: II  TM Distance: >3 FB Neck ROM: Full    Dental  (+) Dental Advisory Given   Pulmonary neg pulmonary ROS   breath sounds clear to auscultation       Cardiovascular hypertension, Pt. on medications and Pt. on home beta blockers (-) angina (-) CAD + Valvular Problems/Murmurs AS  Rhythm:Regular Rate:Normal + Systolic murmurs 89/7974 Cath: no evidence of CAD  03/2024 ECHO: EF 60-65%, normal LVF, normal RVF, trivial MR, severe calcification and thickening of the AV with severe AS, mean gradient 40.7 mmHg   Neuro/Psych   Anxiety Depression    negative neurological ROS     GI/Hepatic Neg liver ROS,GERD  Medicated and Controlled,,  Endo/Other  negative endocrine ROS    Renal/GU negative Renal ROS     Musculoskeletal   Abdominal   Peds  Hematology Hb 10.9, plt 172   Anesthesia Other Findings H/o prostatectomy  Reproductive/Obstetrics                              Anesthesia Physical Anesthesia Plan  ASA: 4  Anesthesia Plan: MAC   Post-op Pain Management:    Induction:   PONV Risk Score and Plan: 1 and Treatment may vary due to age or medical condition  Airway Management Planned: Natural Airway and Simple Face Mask  Additional Equipment: None  Intra-op Plan:   Post-operative Plan:   Informed Consent: I have reviewed the patients History and Physical, chart, labs and discussed the procedure including the risks, benefits and alternatives for the proposed anesthesia with the patient or authorized representative who has indicated his/her understanding and  acceptance.     Dental advisory given  Plan Discussed with: CRNA and Surgeon  Anesthesia Plan Comments:          Anesthesia Quick Evaluation

## 2024-07-27 ENCOUNTER — Other Ambulatory Visit: Payer: Self-pay

## 2024-07-27 ENCOUNTER — Inpatient Hospital Stay (HOSPITAL_COMMUNITY): Admitting: Anesthesiology

## 2024-07-27 ENCOUNTER — Inpatient Hospital Stay (HOSPITAL_COMMUNITY): Admission: RE | Disposition: A | Source: Home / Self Care | Attending: Cardiovascular Disease

## 2024-07-27 ENCOUNTER — Inpatient Hospital Stay (HOSPITAL_COMMUNITY): Payer: Self-pay | Admitting: Vascular Surgery

## 2024-07-27 ENCOUNTER — Inpatient Hospital Stay (HOSPITAL_COMMUNITY)

## 2024-07-27 ENCOUNTER — Encounter (HOSPITAL_COMMUNITY): Payer: Self-pay | Admitting: Cardiovascular Disease

## 2024-07-27 ENCOUNTER — Inpatient Hospital Stay (HOSPITAL_COMMUNITY)
Admission: RE | Admit: 2024-07-27 | Discharge: 2024-07-28 | DRG: 266 | Disposition: A | Attending: Cardiovascular Disease | Admitting: Cardiovascular Disease

## 2024-07-27 ENCOUNTER — Encounter: Payer: Self-pay | Admitting: Internal Medicine

## 2024-07-27 DIAGNOSIS — C61 Malignant neoplasm of prostate: Secondary | ICD-10-CM

## 2024-07-27 DIAGNOSIS — Z8042 Family history of malignant neoplasm of prostate: Secondary | ICD-10-CM

## 2024-07-27 DIAGNOSIS — J9601 Acute respiratory failure with hypoxia: Secondary | ICD-10-CM | POA: Diagnosis not present

## 2024-07-27 DIAGNOSIS — J449 Chronic obstructive pulmonary disease, unspecified: Secondary | ICD-10-CM | POA: Diagnosis present

## 2024-07-27 DIAGNOSIS — Z886 Allergy status to analgesic agent status: Secondary | ICD-10-CM

## 2024-07-27 DIAGNOSIS — Z79899 Other long term (current) drug therapy: Secondary | ICD-10-CM

## 2024-07-27 DIAGNOSIS — Z91041 Radiographic dye allergy status: Secondary | ICD-10-CM

## 2024-07-27 DIAGNOSIS — F411 Generalized anxiety disorder: Secondary | ICD-10-CM

## 2024-07-27 DIAGNOSIS — I493 Ventricular premature depolarization: Secondary | ICD-10-CM | POA: Diagnosis present

## 2024-07-27 DIAGNOSIS — R21 Rash and other nonspecific skin eruption: Secondary | ICD-10-CM | POA: Diagnosis not present

## 2024-07-27 DIAGNOSIS — Z006 Encounter for examination for normal comparison and control in clinical research program: Secondary | ICD-10-CM

## 2024-07-27 DIAGNOSIS — Z9079 Acquired absence of other genital organ(s): Secondary | ICD-10-CM

## 2024-07-27 DIAGNOSIS — I350 Nonrheumatic aortic (valve) stenosis: Secondary | ICD-10-CM | POA: Diagnosis not present

## 2024-07-27 DIAGNOSIS — T508X5A Adverse effect of diagnostic agents, initial encounter: Secondary | ICD-10-CM | POA: Diagnosis not present

## 2024-07-27 DIAGNOSIS — Z8719 Personal history of other diseases of the digestive system: Secondary | ICD-10-CM

## 2024-07-27 DIAGNOSIS — I110 Hypertensive heart disease with heart failure: Secondary | ICD-10-CM | POA: Diagnosis present

## 2024-07-27 DIAGNOSIS — Z66 Do not resuscitate: Secondary | ICD-10-CM | POA: Diagnosis present

## 2024-07-27 DIAGNOSIS — Z8711 Personal history of peptic ulcer disease: Secondary | ICD-10-CM

## 2024-07-27 DIAGNOSIS — Z8546 Personal history of malignant neoplasm of prostate: Secondary | ICD-10-CM

## 2024-07-27 DIAGNOSIS — Z9049 Acquired absence of other specified parts of digestive tract: Secondary | ICD-10-CM

## 2024-07-27 DIAGNOSIS — I9581 Postprocedural hypotension: Secondary | ICD-10-CM | POA: Diagnosis not present

## 2024-07-27 DIAGNOSIS — Z961 Presence of intraocular lens: Secondary | ICD-10-CM | POA: Diagnosis present

## 2024-07-27 DIAGNOSIS — Z9841 Cataract extraction status, right eye: Secondary | ICD-10-CM

## 2024-07-27 DIAGNOSIS — I10 Essential (primary) hypertension: Secondary | ICD-10-CM | POA: Diagnosis not present

## 2024-07-27 DIAGNOSIS — Z9842 Cataract extraction status, left eye: Secondary | ICD-10-CM

## 2024-07-27 DIAGNOSIS — D696 Thrombocytopenia, unspecified: Secondary | ICD-10-CM | POA: Diagnosis present

## 2024-07-27 DIAGNOSIS — E785 Hyperlipidemia, unspecified: Secondary | ICD-10-CM | POA: Diagnosis present

## 2024-07-27 DIAGNOSIS — I5033 Acute on chronic diastolic (congestive) heart failure: Secondary | ICD-10-CM | POA: Diagnosis present

## 2024-07-27 DIAGNOSIS — Y92239 Unspecified place in hospital as the place of occurrence of the external cause: Secondary | ICD-10-CM | POA: Diagnosis not present

## 2024-07-27 DIAGNOSIS — D6489 Other specified anemias: Secondary | ICD-10-CM | POA: Diagnosis not present

## 2024-07-27 DIAGNOSIS — G8929 Other chronic pain: Secondary | ICD-10-CM | POA: Diagnosis present

## 2024-07-27 DIAGNOSIS — Z77090 Contact with and (suspected) exposure to asbestos: Secondary | ICD-10-CM | POA: Diagnosis present

## 2024-07-27 DIAGNOSIS — Z7982 Long term (current) use of aspirin: Secondary | ICD-10-CM

## 2024-07-27 DIAGNOSIS — Z952 Presence of prosthetic heart valve: Principal | ICD-10-CM

## 2024-07-27 HISTORY — PX: INTRAOPERATIVE TRANSTHORACIC ECHOCARDIOGRAM: SHX6523

## 2024-07-27 LAB — ECHOCARDIOGRAM LIMITED
AR max vel: 1.06 cm2
AV Area VTI: 0.81 cm2
AV Area mean vel: 1.04 cm2
AV Mean grad: 25.5 mmHg
AV Peak grad: 37.7 mmHg
Ao pk vel: 3.07 m/s

## 2024-07-27 LAB — POCT I-STAT, CHEM 8
BUN: 26 mg/dL — ABNORMAL HIGH (ref 8–23)
Calcium, Ion: 1.18 mmol/L (ref 1.15–1.40)
Chloride: 100 mmol/L (ref 98–111)
Creatinine, Ser: 0.9 mg/dL (ref 0.61–1.24)
Glucose, Bld: 179 mg/dL — ABNORMAL HIGH (ref 70–99)
HCT: 33 % — ABNORMAL LOW (ref 39.0–52.0)
Hemoglobin: 11.2 g/dL — ABNORMAL LOW (ref 13.0–17.0)
Potassium: 4.1 mmol/L (ref 3.5–5.1)
Sodium: 142 mmol/L (ref 135–145)
TCO2: 32 mmol/L (ref 22–32)

## 2024-07-27 LAB — POCT ACTIVATED CLOTTING TIME
Activated Clotting Time: 214 s
Activated Clotting Time: 240 s

## 2024-07-27 SURGERY — TRANSCATHETER AORTIC VALVE REPLACEMENT, TRANSFEMORAL (CATHLAB)
Anesthesia: Monitor Anesthesia Care

## 2024-07-27 MED ORDER — CHLORHEXIDINE GLUCONATE 4 % EX SOLN: 60.0000 mL | Freq: Once | CUTANEOUS | Status: DC

## 2024-07-27 MED ORDER — CHLORHEXIDINE GLUCONATE 0.12 % MT SOLN
15.0000 mL | Freq: Once | OROMUCOSAL | Status: AC
  Administered 2024-07-27: 15 mL via OROMUCOSAL
  Filled 2024-07-27: qty 15

## 2024-07-27 MED ORDER — OXYCODONE HCL 5 MG PO TABS: 5.0000 mg | ORAL_TABLET | Freq: Once | ORAL | Status: DC | PRN

## 2024-07-27 MED ORDER — LACTATED RINGERS IV SOLN: INTRAVENOUS | Status: DC | PRN

## 2024-07-27 MED ORDER — LIDOCAINE HCL (PF) 1 % IJ SOLN
INTRAMUSCULAR | Status: AC
  Filled 2024-07-27: qty 30

## 2024-07-27 MED ORDER — OXYCODONE HCL 5 MG/5ML PO SOLN: 5.0000 mg | Freq: Once | ORAL | Status: DC | PRN

## 2024-07-27 MED ORDER — CEFAZOLIN SODIUM-DEXTROSE 2-3 GM-%(50ML) IV SOLR
INTRAVENOUS | Status: DC | PRN
  Administered 2024-07-27: 2 g via INTRAVENOUS

## 2024-07-27 MED ORDER — DAPAGLIFLOZIN PROPANEDIOL 10 MG PO TABS
10.0000 mg | ORAL_TABLET | Freq: Every day | ORAL | Status: DC
  Administered 2024-07-28: 10 mg via ORAL
  Filled 2024-07-27: qty 1

## 2024-07-27 MED ORDER — MIDAZOLAM HCL (PF) 2 MG/2ML IJ SOLN: 0.5000 mg | Freq: Once | INTRAMUSCULAR | Status: DC | PRN

## 2024-07-27 MED ORDER — PROTAMINE SULFATE 10 MG/ML IV SOLN
INTRAVENOUS | Status: DC | PRN
  Administered 2024-07-27: 40 mg via INTRAVENOUS
  Administered 2024-07-27: 10 mg via INTRAVENOUS

## 2024-07-27 MED ORDER — FENTANYL CITRATE (PF) 100 MCG/2ML IJ SOLN: 25.0000 ug | INTRAMUSCULAR | Status: DC | PRN

## 2024-07-27 MED ORDER — CEFAZOLIN SODIUM-DEXTROSE 2-4 GM/100ML-% IV SOLN
2.0000 g | Freq: Three times a day (TID) | INTRAVENOUS | Status: AC
  Administered 2024-07-27 – 2024-07-28 (×2): 2 g via INTRAVENOUS
  Filled 2024-07-27 (×2): qty 100

## 2024-07-27 MED ORDER — TRAMADOL HCL 50 MG PO TABS: 50.0000 mg | ORAL_TABLET | ORAL | Status: DC | PRN

## 2024-07-27 MED ORDER — MEPERIDINE HCL 25 MG/ML IJ SOLN: 6.2500 mg | INTRAMUSCULAR | Status: DC | PRN

## 2024-07-27 MED ORDER — ALBUMIN HUMAN 5 % IV SOLN
INTRAVENOUS | Status: AC
  Administered 2024-07-27: 12.5 g via INTRAVENOUS
  Filled 2024-07-27: qty 250

## 2024-07-27 MED ORDER — SODIUM CHLORIDE 0.9% FLUSH
3.0000 mL | Freq: Two times a day (BID) | INTRAVENOUS | Status: DC
  Administered 2024-07-28: 3 mL via INTRAVENOUS

## 2024-07-27 MED ORDER — SERTRALINE HCL 100 MG PO TABS
100.0000 mg | ORAL_TABLET | Freq: Every day | ORAL | Status: DC
  Administered 2024-07-27 – 2024-07-28 (×2): 100 mg via ORAL
  Filled 2024-07-27 (×2): qty 1

## 2024-07-27 MED ORDER — OXYCODONE HCL 5 MG PO TABS: 5.0000 mg | ORAL_TABLET | ORAL | Status: DC | PRN

## 2024-07-27 MED ORDER — DOPAMINE-DEXTROSE 3.2-5 MG/ML-% IV SOLN
INTRAVENOUS | Status: AC
  Filled 2024-07-27: qty 250

## 2024-07-27 MED ORDER — ONDANSETRON HCL 4 MG/2ML IJ SOLN: 4.0000 mg | Freq: Four times a day (QID) | INTRAMUSCULAR | Status: DC | PRN

## 2024-07-27 MED ORDER — PROPOFOL 500 MG/50ML IV EMUL
INTRAVENOUS | Status: DC | PRN
  Administered 2024-07-27: 25 ug/kg/min via INTRAVENOUS

## 2024-07-27 MED ORDER — PHENYLEPHRINE 80 MCG/ML (10ML) SYRINGE FOR IV PUSH (FOR BLOOD PRESSURE SUPPORT)
PREFILLED_SYRINGE | INTRAVENOUS | Status: DC | PRN
  Administered 2024-07-27 (×2): 80 ug via INTRAVENOUS

## 2024-07-27 MED ORDER — DIPHENHYDRAMINE HCL 50 MG/ML IJ SOLN: 12.5000 mg | Freq: Once | INTRAMUSCULAR | Status: AC

## 2024-07-27 MED ORDER — SODIUM CHLORIDE 0.9 % IV SOLN: INTRAVENOUS | Status: AC

## 2024-07-27 MED ORDER — CHLORHEXIDINE GLUCONATE 4 % EX SOLN: 30.0000 mL | CUTANEOUS | Status: DC

## 2024-07-27 MED ORDER — NOREPINEPHRINE 4 MG/250ML-% IV SOLN
INTRAVENOUS | Status: AC
  Administered 2024-07-27: 4 ug/min via INTRAVENOUS
  Filled 2024-07-27: qty 250

## 2024-07-27 MED ORDER — ACETAMINOPHEN 500 MG PO TABS
1000.0000 mg | ORAL_TABLET | Freq: Once | ORAL | Status: AC
  Administered 2024-07-27: 1000 mg via ORAL
  Filled 2024-07-27: qty 2

## 2024-07-27 MED ORDER — IODIXANOL 320 MG/ML IV SOLN
INTRAVENOUS | Status: DC | PRN
  Administered 2024-07-27: 35 mL via INTRA_ARTERIAL

## 2024-07-27 MED ORDER — LIDOCAINE HCL (PF) 1 % IJ SOLN
INTRAMUSCULAR | Status: DC | PRN
  Administered 2024-07-27: 20 mL

## 2024-07-27 MED ORDER — NOREPINEPHRINE 4 MG/250ML-% IV SOLN: 0.0000 ug/min | INTRAVENOUS | Status: DC

## 2024-07-27 MED ORDER — HEPARIN SODIUM (PORCINE) 1000 UNIT/ML IJ SOLN
INTRAMUSCULAR | Status: DC | PRN
  Administered 2024-07-27: 5000 [IU] via INTRAVENOUS
  Administered 2024-07-27: 13500 [IU] via INTRAVENOUS

## 2024-07-27 MED ORDER — SODIUM CHLORIDE 0.9% FLUSH: 3.0000 mL | INTRAVENOUS | Status: DC | PRN

## 2024-07-27 MED ORDER — PANTOPRAZOLE SODIUM 40 MG PO TBEC
40.0000 mg | DELAYED_RELEASE_TABLET | Freq: Every day | ORAL | Status: DC
  Administered 2024-07-27 – 2024-07-28 (×2): 40 mg via ORAL
  Filled 2024-07-27 (×2): qty 1

## 2024-07-27 MED ORDER — ACETAMINOPHEN 325 MG PO TABS
650.0000 mg | ORAL_TABLET | Freq: Four times a day (QID) | ORAL | Status: DC | PRN
  Administered 2024-07-28: 650 mg via ORAL
  Filled 2024-07-27: qty 2

## 2024-07-27 MED ORDER — DIPHENHYDRAMINE HCL 50 MG/ML IJ SOLN
INTRAMUSCULAR | Status: AC
  Administered 2024-07-27: 12.5 mg via INTRAVENOUS
  Filled 2024-07-27: qty 1

## 2024-07-27 MED ORDER — HEPARIN (PORCINE) IN NACL 1000-0.9 UT/500ML-% IV SOLN
INTRAVENOUS | Status: DC | PRN
  Administered 2024-07-27: 1500 mL via SURGICAL_CAVITY

## 2024-07-27 MED ORDER — MORPHINE SULFATE (PF) 2 MG/ML IV SOLN: 1.0000 mg | INTRAVENOUS | Status: DC | PRN

## 2024-07-27 MED ORDER — ASPIRIN 81 MG PO CHEW
81.0000 mg | CHEWABLE_TABLET | Freq: Every day | ORAL | Status: DC
  Administered 2024-07-28: 81 mg via ORAL
  Filled 2024-07-27: qty 1

## 2024-07-27 MED ORDER — NITROGLYCERIN IN D5W 200-5 MCG/ML-% IV SOLN: 0.0000 ug/min | INTRAVENOUS | Status: DC

## 2024-07-27 MED ORDER — ACETAMINOPHEN 650 MG RE SUPP: 650.0000 mg | Freq: Four times a day (QID) | RECTAL | Status: DC | PRN

## 2024-07-27 MED ORDER — TRAZODONE HCL 50 MG PO TABS: 50.0000 mg | ORAL_TABLET | Freq: Every evening | ORAL | Status: DC | PRN

## 2024-07-27 MED ORDER — LACTATED RINGERS IV SOLN: INTRAVENOUS | Status: DC

## 2024-07-27 MED ORDER — ALBUMIN HUMAN 5 % IV SOLN: 12.5000 g | Freq: Once | INTRAVENOUS | Status: AC

## 2024-07-27 MED ORDER — METHYLPREDNISOLONE SODIUM SUCC 125 MG IJ SOLR: 125.0000 mg | Freq: Once | INTRAMUSCULAR | Status: AC

## 2024-07-27 MED ORDER — SODIUM CHLORIDE 0.9 % IV SOLN: 250.0000 mL | INTRAVENOUS | Status: DC | PRN

## 2024-07-27 MED ORDER — MAGNESIUM CHLORIDE 64 MG PO TBEC
1.0000 | DELAYED_RELEASE_TABLET | Freq: Every day | ORAL | Status: DC
  Administered 2024-07-27: 64 mg via ORAL
  Filled 2024-07-27: qty 1

## 2024-07-27 MED ORDER — DOPAMINE-DEXTROSE 3.2-5 MG/ML-% IV SOLN: 3.0000 ug/kg/min | INTRAVENOUS | Status: DC

## 2024-07-27 MED ORDER — METHYLPREDNISOLONE SODIUM SUCC 125 MG IJ SOLR
INTRAMUSCULAR | Status: AC
  Administered 2024-07-27: 125 mg via INTRAVENOUS
  Filled 2024-07-27: qty 2

## 2024-07-27 MED ORDER — SODIUM CHLORIDE 0.9 % IV SOLN: INTRAVENOUS | Status: DC

## 2024-07-27 SURGICAL SUPPLY — 31 items
BAG SNAP BAND KOVER 36X36 (MISCELLANEOUS) ×2 IMPLANT
CABLE ADAPT PACING TEMP 12FT (ADAPTER) IMPLANT
CATH 26 ULTRA DELIVERY (CATHETERS) IMPLANT
CATH DIAG 6FR PIGTAIL ANGLED (CATHETERS) IMPLANT
CATH INFINITI 5FR ANG PIGTAIL (CATHETERS) IMPLANT
CATH INFINITI 6F AL1 (CATHETERS) IMPLANT
CATH S G BIP PACING (CATHETERS) IMPLANT
CLOSURE MYNX CONTROL 6F/7F (Vascular Products) IMPLANT
CLOSURE PERCLOSE PROSTYLE (Vascular Products) IMPLANT
CRIMPER (MISCELLANEOUS) IMPLANT
DEVICE INFLATION ATRION QL2530 (MISCELLANEOUS) IMPLANT
ELECT DEFIB PAD ADLT CADENCE (PAD) IMPLANT
KIT MICROPUNCTURE NIT STIFF (SHEATH) IMPLANT
KIT SAPIAN 3 ULTRA RESILIA 26 (Valve) IMPLANT
KIT SYRINGE INJ CVI SPIKEX1 (MISCELLANEOUS) IMPLANT
PACK CARDIAC CATHETERIZATION (CUSTOM PROCEDURE TRAY) ×1 IMPLANT
PAD SORBX EP SHIELD 16.5X12 (MISCELLANEOUS) IMPLANT
SET ATX-X65L (MISCELLANEOUS) IMPLANT
SHEATH BRITE TIP 7FR 35CM (SHEATH) IMPLANT
SHEATH INTRODUCER SET 20-26 (SHEATH) IMPLANT
SHEATH PINNACLE 6F 10CM (SHEATH) IMPLANT
SHEATH PINNACLE 8F 10CM (SHEATH) IMPLANT
SHIELD CATH-GARD CONTAMINATION (MISCELLANEOUS) IMPLANT
STOPCOCK MORSE 400PSI 3WAY (MISCELLANEOUS) ×2 IMPLANT
TRANSDUCER W/STOPCOCK (MISCELLANEOUS) IMPLANT
TUBING ART PRESS 72 MALE/FEM (TUBING) IMPLANT
WIRE AMPLATZ SS-J .035X180CM (WIRE) IMPLANT
WIRE EMERALD 3MM-J .035X150CM (WIRE) IMPLANT
WIRE EMERALD 3MM-J .035X260CM (WIRE) IMPLANT
WIRE EMERALD ST .035X260CM (WIRE) IMPLANT
WIRE SAFARI SM CURVE 275 (WIRE) IMPLANT

## 2024-07-27 NOTE — Progress Notes (Addendum)
  [facility]   MULTIDISCIPLINARY HEART VALVE TEAM  Called to see pt due to hypotension requiring levophed s/p TAVR. Diffuse, widespread, raised red rash noted all over body. Had some facial redness and eye swelling. No airway symptoms. Treated with IV benadryl and solumedrol with improvement. Contrast dye added to allergy list.      Groin sites stable. Rash noted there too  ECG with sinus brady but no high grade block. Transferred from cath lab holding to 4E. Early ambulation after bedrest completed and hopeful discharge over the next 24-48 hours.   [name] PA-C  [facility]  Pager [phone]

## 2024-07-27 NOTE — Discharge Instructions (Signed)

## 2024-07-27 NOTE — Op Note (Signed)
 HEART AND VASCULAR CENTER   MULTIDISCIPLINARY HEART VALVE TEAM   TAVR OPERATIVE NOTE   Date of Procedure:  07/27/2024  Preoperative Diagnosis: Severe Aortic Stenosis   Postoperative Diagnosis: Same   Procedure:   Transcatheter Aortic Valve Replacement - Percutaneous  Transfemoral Approach  Edwards Sapien 3 Ultra Resilia THV (size 23 mm, serial # [id number])   Co-Surgeons:  Con Clunes, MD and Ozell Fell, MD  Anesthesiologist:  FORBES Kelly Mace, MD  Echocardiographer:  Maude Emmer, MD  Pre-operative Echo Findings: Severe aortic stenosis Normal left ventricular systolic function  Post-operative Echo Findings: No paravalvular leak Normal/unchanged left ventricular systolic function  BRIEF CLINICAL NOTE AND INDICATIONS FOR SURGERY  89 year old gentleman with severe, stage D1 aortic stenosis.  Patient was admitted with acute HFpEF earlier this year.  He was found to have severe aortic stenosis.  He is functionally independent and ultimately underwent evaluation for TAVR.  After multimodality imaging and review of all of his preoperative studies, the multidisciplinary heart team agreed he was a suitable candidate for transfemoral TAVR.  During the course of the patient's preoperative work up they have been evaluated comprehensively by a multidisciplinary team of specialists coordinated through the Multidisciplinary Heart Valve Clinic in the [facility].  They have been demonstrated to suffer from symptomatic severe aortic stenosis as noted above. The patient has been counseled extensively as to the relative risks and benefits of all options for the treatment of severe aortic stenosis including long term medical therapy, conventional surgery for aortic valve replacement, and transcatheter aortic valve replacement.  The patient has been independently evaluated in formal cardiac surgical consultation by Dr Clunes, who deemed the patient appropriate for TAVR. Based  upon review of all of the patient's preoperative diagnostic tests they are felt to be candidate for transcatheter aortic valve replacement using the transfemoral approach as an alternative to conventional surgery.    Following the decision to proceed with transcatheter aortic valve replacement, a discussion has been held regarding what types of management strategies would be attempted intraoperatively in the event of life-threatening complications, including whether or not the patient would be considered a candidate for the use of cardiopulmonary bypass and/or conversion to open sternotomy for attempted surgical intervention.  The patient has been advised of a variety of complications that might develop peculiar to this approach including but not limited to risks of death, stroke, paravalvular leak, aortic dissection or other major vascular complications, aortic annulus rupture, device embolization, cardiac rupture or perforation, acute myocardial infarction, arrhythmia, heart block or bradycardia requiring permanent pacemaker placement, congestive heart failure, respiratory failure, renal failure, pneumonia, infection, other late complications related to structural valve deterioration or migration, or other complications that might ultimately cause a temporary or permanent loss of functional independence or other long term morbidity.  The patient provides full informed consent for the procedure as described and all questions were answered preoperatively.  DETAILS OF THE OPERATIVE PROCEDURE  PREPARATION:   The patient is brought to the operating room on the above mentioned date and central monitoring was established by the anesthesia team including placement of a radial arterial line. The patient is placed in the supine position on the operating table.  Intravenous antibiotics are administered. The patient is monitored closely throughout the procedure under conscious sedation.  Baseline transthoracic  echocardiogram is performed. The patient's chest, abdomen, both groins, and both lower extremities are prepared and draped in a sterile manner. A time out procedure is performed.   PERIPHERAL  ACCESS:   Using ultrasound guidance, femoral arterial and venous access is obtained with placement of 6 Fr sheaths on the left side.  US  images are digitally captured and stored in the patient's chart. A pigtail diagnostic catheter was passed through the femoral arterial sheath under fluoroscopic guidance into the aortic root.  A temporary transvenous pacemaker catheter was passed through the femoral venous sheath under fluoroscopic guidance into the right ventricle.  The pacemaker was tested to ensure stable lead placement and pacemaker capture. Aortic root angiography was performed in order to determine the optimal angiographic angle for valve deployment.  TRANSFEMORAL ACCESS:  A micropuncture technique is used to access the right femoral artery under fluoroscopic and ultrasound guidance.  2 Perclose devices are deployed at 10' and 2' positions to 'PreClose' the femoral artery. An 8 French sheath is placed and then an Amplatz Superstiff wire is advanced through the sheath. This is changed out for a 14 French transfemoral E-Sheath after progressively dilating over the Superstiff wire.  An AL-1 catheter was used to direct a straight-tip exchange length wire across the native aortic valve into the left ventricle. This was exchanged out for a pigtail catheter and position was confirmed in the LV apex. Simultaneous LV and Ao pressures were recorded.  The pigtail catheter was exchanged for a Safari wire in the LV apex.    BALLOON AORTIC VALVULOPLASTY:  Not performed  TRANSCATHETER HEART VALVE DEPLOYMENT:  An Edwards Sapien 3 Ultra Resilia transcatheter heart valve (size 23 mm) was prepared and crimped per manufacturer's guidelines, and the proper orientation of the valve is confirmed on the [employer] delivery  system. The valve was advanced through the introducer sheath using normal technique until in an appropriate position in the abdominal aorta beyond the sheath tip. The balloon was then retracted and using the fine-tuning wheel was centered on the valve. The valve was then advanced across the aortic arch using appropriate flexion of the catheter. The valve was carefully positioned across the aortic valve annulus. The Commander catheter was retracted using normal technique. Once final position of the valve has been confirmed by angiographic assessment, the valve is deployed while temporarily holding ventilation and during rapid ventricular pacing to maintain systolic blood pressure < 50 mmHg and pulse pressure < 10 mmHg. The balloon inflation is held for >3 seconds after reaching full deployment volume. Once the balloon has fully deflated the balloon is retracted into the ascending aorta and valve function is assessed using echocardiography. The patient's hemodynamic recovery following valve deployment is good.  The deployment balloon and guidewire are both removed. Echo demostrated acceptable post-procedural gradients, stable mitral valve function, and no aortic insufficiency.    PROCEDURE COMPLETION:  The sheath was removed and femoral artery closure is performed using the 2 previously deployed Perclose devices.  Protamine is administered once femoral arterial repair was complete. The site is clear with no evidence of bleeding or hematoma after the sutures are tightened. The temporary pacemaker and pigtail catheters are removed. Mynx closure is used for contralateral femoral arterial hemostasis for the 6 Fr sheath.  The patient tolerated the procedure well and is transported to the recovery area in stable condition. There were no immediate intraoperative complications. All sponge instrument and needle counts are verified correct at completion of the operation.   The patient received a total of 35 mL of  intravenous contrast during the procedure.  EBL: minimal  LVEDP: 22 mmHg  Post-procedure mean gradient 7 mmHg   Ozell Fell, MD  07/27/2024 9:45 AM

## 2024-07-27 NOTE — Anesthesia Postprocedure Evaluation (Signed)
 Anesthesia Post Note  Patient: Gregory Pearson  Procedure(s) Performed: Transcatheter Aortic Valve Replacement, Transfemoral ECHOCARDIOGRAM, TRANSTHORACIC     Patient location during evaluation: Cath Lab Anesthesia Type: MAC Level of consciousness: awake and alert, oriented and patient cooperative Pain management: pain level controlled Vital Signs Assessment: post-procedure vital signs reviewed and stable Respiratory status: spontaneous breathing, nonlabored ventilation, respiratory function stable and patient connected to nasal cannula oxygen Cardiovascular status: blood pressure returned to baseline and stable Postop Assessment: no apparent nausea or vomiting Anesthetic complications: no Comments: Pt with erythematous, raised rash (trunk, axillae, groin), with hypotension. Presume from intra-op IV contrast. Treated with volume, steroids, antihistamine, and intermittent pressor. Stabilized quickly, Dr. Wonda aware   There were no known notable events for this encounter.  Last Vitals:  Vitals:   07/27/24 1155 07/27/24 1200  BP: 98/71 101/63  Pulse: (!) 55 (!) 48  Resp: 15 17  Temp:    SpO2: 99% 96%    Last Pain:  Vitals:   07/27/24 1000  TempSrc:   PainSc: 0-No pain                 Zahid Carneiro,E. Grisela Mesch

## 2024-07-27 NOTE — Interval H&P Note (Signed)
 History and Physical Interval Note:  07/27/2024 6:44 AM  Gregory Pearson  has presented today for surgery, with the diagnosis of Severe Aortic Stenosis.  The various methods of treatment have been discussed with the patient and family. After consideration of risks, benefits and other options for treatment, the patient has consented to  Procedure(s): Transcatheter Aortic Valve Replacement, Transfemoral (N/A) ECHOCARDIOGRAM, TRANSTHORACIC (N/A) as a surgical intervention.  The patient's history has been reviewed, patient examined, no change in status, stable for surgery.  I have reviewed the patient's chart and labs.  Questions were answered to the patient's satisfaction.     Con RAMAN Tyson Parkison

## 2024-07-27 NOTE — Op Note (Signed)
 HEART AND VASCULAR CENTER   MULTIDISCIPLINARY HEART VALVE TEAM   TAVR OPERATIVE NOTE   Date of Procedure:  07/27/2024  Preoperative Diagnosis: Severe Aortic Stenosis   Postoperative Diagnosis: Same   Procedure:   Transcatheter Aortic Valve Replacement - Percutaneous Transfemoral Approach  Edwards Sapien 3 Ultra Resilia (size 23 mm, model #S3URCM26A, serial # [id number])   Co-Surgeons:  Con Clunes, MD and Ozell Fell, MD  Anesthesiologist:  Leonce, MD  Echocardiographer:  Delford, MD  Pre-operative Echo Findings: Severe aortic stenosis Normal left ventricular systolic function  Post-operative Echo Findings: No paravalvular leak Normal left ventricular systolic function   BRIEF CLINICAL NOTE AND INDICATIONS FOR SURGERY  Gregory Pearson is a 89 y.o. male presents for evaluation of severe aortic stenosis and TAVR.  He has a history of chronic diastolic HF, PVCs, COPD, GERD and prostate cancer.  He also has severe AS and was admitted to [facility] in August with respiratory failure due to acute CHF.  He has since been on Torsemide  and feels much better.     Overall he feels well.  His breathing is much better as is his leg swelling.  He denies chest pain, palpitations, syncope or near syncope.  He has a history of asbestos exposure, no history of chest surgery or radiation.  He lives by himself but it's on his daughter's property who's about 50 ft away.  His son lives across the street.  He is active, enjoys gardening and is going dancing tonight.  He uses 2L [place] at all times and uses a walker for ambulation.  DETAILS OF THE OPERATIVE PROCEDURE  PREPARATION:    The patient was brought to the operating room on the above mentioned date and appropriate monitoring was established by the anesthesia team. The patient was placed in the supine position on the operating table.  Intravenous antibiotics were administered. The patient was monitored closely throughout the procedure under conscious  sedation.  Baseline transthoracic echocardiogram was performed. The patient's abdomen and both groins were prepped and draped in a sterile manner. A time out procedure was performed.   PERIPHERAL ACCESS:    Using the modified Seldinger technique, femoral arterial and venous access was obtained with placement of 6 Fr sheaths on the left side.  A pigtail diagnostic catheter was passed through the left femoral arterial sheath under fluoroscopic guidance into the aortic root.  A temporary transvenous pacemaker catheter was passed through the left femoral venous sheath under fluoroscopic guidance into the right ventricle.  The pacemaker was tested to ensure stable lead placement and pacemaker capture. Aortic root angiography was performed in order to determine the optimal angiographic angle for valve deployment.   TRANSFEMORAL ACCESS:   Percutaneous transfemoral access and sheath placement was performed using ultrasound guidance.  The right common femoral artery was cannulated using a micropuncture needle and appropriate location was verified using hand injection angiogram.  A pair of Abbott Perclose percutaneous closure devices were placed and a 6 French sheath replaced into the femoral artery.  The patient was heparinized systemically and ACT verified > 250 seconds.    A 14 Fr transfemoral E-sheath was introduced into the right common femoral artery after progressively dilating over an Amplatz superstiff wire. An AL-1 catheter was used to direct a straight-tip exchange length wire across the native aortic valve into the left ventricle. This was exchanged out for a pigtail catheter and position was confirmed in the LV apex. Simultaneous LV and Ao pressures were recorded.  The LVEDP was  22 mmHg.  The pigtail catheter was exchanged for a Safari wire in the LV apex.    TRANSCATHETER HEART VALVE DEPLOYMENT:   An Edwards Sapien 3 Ultra transcatheter heart valve (26 mm) was prepared and crimped per  manufacturer's guidelines, and the proper orientation of the valve was confirmed on the [employer] delivery system. The valve was advanced through the introducer sheath using normal technique until in an appropriate position in the abdominal aorta beyond the sheath tip. The balloon was then retracted and using the fine-tuning wheel was centered on the valve. The valve was then advanced across the aortic arch using appropriate flexion of the catheter. The valve was carefully positioned across the aortic valve annulus. The Commander catheter was retracted using normal technique. Once final position of the valve was confirmed by angiographic assessment, the valve was deployed during rapid ventricular pacing to maintain systolic blood pressure < 50 mmHg and pulse pressure < 10 mmHg. The balloon inflation was held for >3 seconds after reaching full deployment volume. Once the balloon was fully deflated the balloon was retracted into the ascending aorta and valve function was assessed using echocardiography. There was felt to be no paravalvular leak and no central aortic insufficiency.  The patient's hemodynamic recovery following valve deployment was good.  The deployment balloon and guidewire were both removed.    PROCEDURE COMPLETION:   The sheath was removed and femoral artery closure performed.  Protamine was administered once femoral arterial repair was complete. The temporary pacemaker, pigtail catheter and femoral sheaths were removed with manual pressure used for venous hemostasis.  A Mynx femoral closure device was utilized following removal of the diagnostic sheath in the left femoral artery.  The patient tolerated the procedure well and was transported to the cath lab recovery area in stable condition. There were no immediate intraoperative complications. All sponge instrument and needle counts were verified correct at completion of the operation.   No blood products were administered during  the operation.  The patient received a total of 35 mL of intravenous contrast during the procedure.   Con GORMAN Clunes, MD 07/27/2024 9:37 AM

## 2024-07-27 NOTE — Transfer of Care (Signed)
 Immediate Anesthesia Transfer of Care Note  Patient: Gregory Pearson  Procedure(s) Performed: Transcatheter Aortic Valve Replacement, Transfemoral ECHOCARDIOGRAM, TRANSTHORACIC  Patient Location: PACU and Cath Lab  Anesthesia Type:MAC  Level of Consciousness: drowsy  Airway & Oxygen Therapy: Patient Spontanous Breathing and Patient connected to nasal cannula oxygen  Post-op Assessment: Report given to RN and Post -op Vital signs reviewed and stable  Post vital signs: Reviewed and stable  Last Vitals:  Vitals Value Taken Time  BP    Temp    Pulse    Resp    SpO2      Last Pain:  Vitals:   07/27/24 0920  TempSrc:   PainSc: Asleep         Complications: There were no known notable events for this encounter.

## 2024-07-27 NOTE — Discharge Summary (Incomplete)
 HEART AND VASCULAR CENTER   MULTIDISCIPLINARY HEART VALVE TEAM  Discharge Summary    Patient ID: Gregory Pearson MRN: [id number]; DOB: Mar 16, 1928  Admit date: 07/27/2024 Discharge date: 07/28/2024  PCP:  Dettinger, Fonda LABOR, MD  [facility] Cardiologist:  Diannah SHAUNNA Maywood, MD  [facility] Structural heart: Ozell Fell, MD [facility] Electrophysiologist:  None   Discharge Diagnoses    Principal Problem:   S/P TAVR (transcatheter aortic valve replacement) Active Problems:   Thrombocytopenia   Hyperlipidemia   History of GI bleed   Chronic back pain   GAD (generalized anxiety disorder)   Acute on chronic diastolic heart failure (HCC)   Severe aortic valve stenosis   Frequent PVCs   Prostate cancer (HCC)   Allergies Allergies  Allergen Reactions   Nsaids Other (See Comments)    GI bleed, PUD   Ivp Dye [Iodinated Contrast Media] Swelling and Rash    Diagnostic Studies/Procedures    TAVR OPERATIVE NOTE     Date of Procedure:                07/27/2024   Preoperative Diagnosis:      Severe Aortic Stenosis    Postoperative Diagnosis:    Same    Procedure:        Transcatheter Aortic Valve Replacement - Percutaneous Transfemoral Approach             Edwards Sapien 3 Ultra Resilia (size 23 mm, model #S3URCM26A, serial # [id number])              Co-Surgeons:                        Con Clunes, MD and Ozell Fell, MD   Anesthesiologist:                  Leonce, MD   Echocardiographer:              Delford, MD   Pre-operative Echo Findings: Severe aortic stenosis Normal left ventricular systolic function   Post-operative Echo Findings: No paravalvular leak Normal left ventricular systolic function _____________    Echo 07/28/24: completed but pending formal read at the time of discharge   History of Present Illness     Gregory Pearson is a 89 y.o. male with a history of HFpEF, PVCs, COPD, GERD, prior GI bleeding, HLD, prostate cancer and severe AS  who presented to [facility] on 07/27/24 for planned TAVR.   He was admitted to [facility] in August 2025 with acute hypoxic respiratory failure and was found to have acute CHF. Echo with LVEF 60-65%. Normal RV function. Severe aortic stenosis with mean gradient 41 mmHg, AVA 0.78 cm2, DI 0.27, SVI 37. He was diuresed with torsemide . He was admitted to [facility] on 05/12/24 with recurrent hypoxic respiratory failure in setting of COPD exacerbation, acute CHF and was also found to have pneumonia. [facility] 07/27/24 showed no angiographic evidence of CAD; mild elevation right heart pressures.  The patient was evaluated by the multidisciplinary valve team and felt to have severe, symptomatic aortic stenosis and to be a suitable candidate for TAVR, which was set up for 07/27/24.  Hospital Course     Consultants: none   Severe AS:  -- S/p TAVR with a 23 mm Edwards Sapien 3 Ultra Resilia THV via the TF approach on 07/27/24.  -- Post operative echo completed but pending formal read. -- Groin sites are stable.  --  ECG with sinus/PVCs and no high grade heart block. -- Continue Asprin 81 mg daily.  -- Met with cardiac rehab to discuss CRP phase II.  -- Plan for discharge home today with close follow up in the outpatient setting.   Acute on chronic HFpEF: -- LVEDP 22 mm hg at the time of TAVR.  -- Treated with IV Lasix  40mg  x1 and KCL .  -- Resume home torsemide  20mg  daily 07/29/24.   -- Continue Farixga 10mg  daily.     HTN: -- BP well controlled.  -- Resume Lopressor  25mg  BID and torsemide  20mg  daily.     PVCs: -- Resume home lopressor  25mg  BID.   Hx of GI bleeding: -- Hg has remained stable.   Anemia: -- Hg 11.2--> 9.5 expected post operatively.   Acute allergic reaction: -- Pt developed hypotension and widespread rash felt to be related to IV contrast, which was added to his allergy list.  -- This resolved with albumin, IV benadryl and solumedrol.  _____________  Discharge  Vitals Blood pressure 122/75, pulse 71, temperature 98 F (36.7 C), temperature source Oral, resp. rate 13, height 6' (1.829 m), weight 95.4 kg, SpO2 100%.  Filed Weights   07/26/24 0700 07/27/24 0601 07/28/24 0300  Weight: 90.3 kg 90.5 kg 95.4 kg     GEN: Well nourished, well developed in no acute distress NECK: No JVD CARDIAC: RRR, very soft flow murmur @ RUSB. No rubs, gallops RESPIRATORY:  Clear to auscultation without rales, wheezing or rhonchi  ABDOMEN: Soft, non-tender, non-distended EXTREMITIES:  No edema; No deformity.  Groin sites clear without hematoma or ecchymosis.    Disposition   Pt is being discharged home today in good condition.  Follow-up Plans & Appointments     Follow-up Information     Sebastian Lamarr SAUNDERS, PA-C. Go on 08/02/2024.   Specialties: Cardiology, Radiology Why: @ 9:05 am, please arrive at least 20 minutes early Contact information: [address] [place] [id number] [phone]                  Discharge Medications   Allergies as of 07/28/2024       Reactions   Nsaids Other (See Comments)   GI bleed, PUD   Ivp Dye [iodinated Contrast Media] Swelling, Rash        Medication List     TAKE these medications    acetaminophen  500 MG tablet Commonly known as: TYLENOL  Take 1 tablet (500 mg total) by mouth every 6 (six) hours as needed.   ascorbic acid 500 MG tablet Commonly known as: VITAMIN C Take 500 mg by mouth in the morning.   aspirin  81 MG chewable tablet Chew 1 tablet (81 mg total) by mouth daily with breakfast.   dapagliflozin  propanediol 10 MG Tabs tablet Commonly known as: Farxiga  Take 1 tablet (10 mg total) by mouth daily before breakfast.   MAGNESIUM  LACTATE PO Take 1 tablet by mouth at bedtime.   metoprolol  tartrate 25 MG tablet Commonly known as: LOPRESSOR  Take 1 tablet (25 mg total) twice a day.HOLD for blood pressure less than 100   multivitamin tablet Take 1 tablet by mouth in the  morning.   pantoprazole  40 MG tablet Commonly known as: PROTONIX  Take 1 tablet (40 mg total) by mouth daily.   sertraline  100 MG tablet Commonly known as: Zoloft  Take 1 tablet (100 mg total) by mouth daily.   torsemide  20 MG tablet Commonly known as: DEMADEX  Take 1 tablet (20 mg total) by mouth daily.  traMADol 50 MG tablet Commonly known as: ULTRAM Take 50 mg by mouth every 6 (six) hours as needed for moderate pain (pain score 4-6).   traZODone  100 MG tablet Commonly known as: DESYREL  Take 1 tablet (100 mg total) by mouth at bedtime as needed. for sleep What changed:  how much to take reasons to take this additional instructions   VITAMIN B-12 PO Take 1 tablet by mouth in the morning.   VITAMIN B-2 PO Take 1 tablet by mouth daily.         Outstanding Labs/Studies   none  ______________________  Duration of Discharge Encounter: APP Time: 31 minutes    Signed, [name], PA-C 07/28/2024, 9:36 AM [phone]

## 2024-07-27 NOTE — Progress Notes (Signed)
 Patient arrived from cath lab VSS alert and oriented x 4 CCMD notifed, CHG bath completed, Bed in lowest position with call light in reach current plan of care in progress

## 2024-07-28 ENCOUNTER — Inpatient Hospital Stay (HOSPITAL_COMMUNITY)

## 2024-07-28 DIAGNOSIS — Z952 Presence of prosthetic heart valve: Secondary | ICD-10-CM

## 2024-07-28 LAB — BASIC METABOLIC PANEL WITH GFR
Anion gap: 10 (ref 5–15)
BUN: 21 mg/dL (ref 8–23)
CO2: 32 mmol/L (ref 22–32)
Calcium: 8.7 mg/dL — ABNORMAL LOW (ref 8.9–10.3)
Chloride: 98 mmol/L (ref 98–111)
Creatinine, Ser: 0.83 mg/dL (ref 0.61–1.24)
GFR, Estimated: >60 mL/min (ref >60)
Glucose, Bld: 122 mg/dL — ABNORMAL HIGH (ref 70–99)
Potassium: 4.2 mmol/L (ref 3.5–5.1)
Sodium: 140 mmol/L (ref 135–145)

## 2024-07-28 LAB — ECHOCARDIOGRAM COMPLETE
AR max vel: 2.8 cm2
AV Area VTI: 2.88 cm2
AV Area mean vel: 3.03 cm2
AV Mean grad: 8.8 mmHg
AV Peak grad: 15.4 mmHg
Ao pk vel: 1.96 m/s
Area-P 1/2: 3.95 cm2
Calc EF: 64 %
Height: 72 in
MV VTI: 3.8 cm2
S' Lateral: 3.8 cm
Single Plane A2C EF: 61.5 %
Single Plane A4C EF: 67.9 %
Weight: 3366.4 [oz_av]

## 2024-07-28 LAB — CBC
HCT: 31.4 % — ABNORMAL LOW (ref 39.0–52.0)
Hemoglobin: 9.5 g/dL — ABNORMAL LOW (ref 13.0–17.0)
MCH: 25.3 pg — ABNORMAL LOW (ref 26.0–34.0)
MCHC: 30.3 g/dL (ref 30.0–36.0)
MCV: 83.7 fL (ref 80.0–100.0)
Platelets: 163 K/uL (ref 150–400)
RBC: 3.75 MIL/uL — ABNORMAL LOW (ref 4.22–5.81)
RDW: 16 % — ABNORMAL HIGH (ref 11.5–15.5)
WBC: 8 K/uL (ref 4.0–10.5)
nRBC: 0 % (ref 0.0–0.2)

## 2024-07-28 LAB — MAGNESIUM: Magnesium: 2.2 mg/dL (ref 1.7–2.4)

## 2024-07-28 MED ORDER — POTASSIUM CHLORIDE CRYS ER 20 MEQ PO TBCR
20.0000 meq | EXTENDED_RELEASE_TABLET | Freq: Once | ORAL | Status: AC
  Administered 2024-07-28: 20 meq via ORAL
  Filled 2024-07-28: qty 1

## 2024-07-28 MED ORDER — FUROSEMIDE 10 MG/ML IJ SOLN
40.0000 mg | Freq: Once | INTRAMUSCULAR | Status: AC
  Filled 2024-07-28: qty 4

## 2024-07-28 MED ORDER — FUROSEMIDE 10 MG/ML IJ SOLN
INTRAMUSCULAR | Status: AC
  Administered 2024-07-28: 40 mg via INTRAVENOUS
  Filled 2024-07-28: qty 4

## 2024-07-28 MED FILL — Dopamine in Dextrose 5% Inj 3.2 MG/ML: INTRAVENOUS | Qty: 250 | Status: AC

## 2024-07-28 NOTE — Progress Notes (Signed)
   07/28/24 1015  TOC Brief Assessment  Insurance and Status Reviewed  Patient has primary care physician Yes  Home environment has been reviewed home  Prior level of function: self/independent  Prior/Current Home Services No current home services  Social Drivers of Health Review SDOH reviewed no interventions necessary  Readmission risk has been reviewed Yes  Transition of care needs no transition of care needs at this time    Pt s/p TAVR, stable for discharge, no HH or DME needs noted. Family to transport home.

## 2024-07-28 NOTE — Progress Notes (Signed)
 Discussed with pt restrictions, exercise guidelines, and CRPII. Pt receptive. Pt not interested in CRPII due to distance but sts he is signed up at a [employer] for exercise in [place]. [id number] Aliene Aris BS, ACSM-CEP 07/28/2024 11:03 AM

## 2024-07-28 NOTE — Progress Notes (Signed)
 Mobility Specialist Progress Note;   07/28/24 0853  Mobility  Activity Ambulated with assistance  Level of Assistance Standby assist, set-up cues, supervision of patient - no hands on  Assistive Device Front wheel walker  Distance Ambulated (ft) 200 ft  Activity Response Tolerated well  Mobility Referral Yes  Mobility visit 1 Mobility  Mobility Specialist Start Time (ACUTE ONLY) [id number]  Mobility Specialist Stop Time (ACUTE ONLY) 0900  Mobility Specialist Time Calculation (min) (ACUTE ONLY) 7 min   Pt eager for mobility. On 1LO2 upon arrival, SPO2 100%. Required no physical assistance during ambulation, SV for safety. Ambulated on RA, SPO2 93%> throughout. No bleeding noted. Pt returned back to bed and left with all needs met, alarm on.   Lauraine Erm Mobility Specialist Please contact via SecureChat or [employer] [phone]

## 2024-07-28 NOTE — Plan of Care (Signed)
?  Problem: Clinical Measurements: ?Goal: Will remain free from infection ?Outcome: Progressing ?Goal: Diagnostic test results will improve ?Outcome: Progressing ?Goal: Respiratory complications will improve ?Outcome: Progressing ?  ?

## 2024-07-29 ENCOUNTER — Telehealth: Payer: Self-pay | Admitting: *Deleted

## 2024-07-29 ENCOUNTER — Encounter: Payer: Self-pay | Admitting: Family Medicine

## 2024-07-29 ENCOUNTER — Telehealth: Payer: Self-pay

## 2024-07-29 NOTE — Transitions of Care (Post Inpatient/ED Visit) (Signed)
 07/29/2024  Name: Gregory Pearson MRN: [id number] DOB: 1927/10/27  Today's TOC FU Call Status: Today's TOC FU Call Status:: Successful TOC FU Call Completed TOC FU Call Complete Date: 07/29/24  Patient's Name and Date of Birth confirmed. Name, DOB  Transition Care Management Follow-up Telephone Call Date of Discharge: 07/28/24 Discharge Facility: [facility]) Type of Discharge: Inpatient Admission Primary Inpatient Discharge Diagnosis:: S/P TAVR (transcatheter aortic valve replacement) How have you been since you were released from the hospital?: Better (eating, drinking well, no issues with bowel/ bladder, using walker for ambulation) Any questions or concerns?: No  Items Reviewed: Did you receive and understand the discharge instructions provided?: Yes Medications obtained,verified, and reconciled?: Yes (Medications Reviewed) Any new allergies since your discharge?: No Dietary orders reviewed?: Yes Type of Diet Ordered:: heart healthy Do you have support at home?: Yes People in Home [RPT]: child(ren), adult Name of Support/Comfort Primary Source: son [name]  Medications Reviewed Today: Medications Reviewed Today     Reviewed by [name] LABOR, RN (Registered Nurse) on 07/29/24 at 1343  Med List Status: <None>   Medication Order Taking? Sig Documenting Provider Last Dose Status Informant  acetaminophen  (TYLENOL ) 500 MG tablet [id number] Yes Take 1 tablet (500 mg total) by mouth every 6 (six) hours as needed. [name] HERO, NP  Active Family Member  ascorbic acid (VITAMIN C) 500 MG tablet [id number] Yes Take 500 mg by mouth in the morning. [provider]  Active Family Member  aspirin  81 MG chewable tablet [id number] Yes Chew 1 tablet (81 mg total) by mouth daily with breakfast. [name], MD  Active Family Member  Cyanocobalamin (VITAMIN B-12 PO) [id number] Yes Take 1 tablet by mouth in the morning. [provider]  Active Family Member   dapagliflozin  propanediol (FARXIGA ) 10 MG TABS tablet [id number] Yes Take 1 tablet (10 mg total) by mouth daily before breakfast. Hayes Beckey CROME, NP  Active Family Member           Med Note SOILA, LYLE JAYSON Schaumann Jul 15, 2024  1:11 PM)    MAGNESIUM  LACTATE PO [id number] Yes Take 1 tablet by mouth at bedtime. [provider]  Active Family Member  metoprolol  tartrate (LOPRESSOR ) 25 MG tablet [id number] Yes Take 1 tablet (25 mg total) twice a day.HOLD for blood pressure less than 100 Mallipeddi, [name], MD  Active Family Member  Multiple Vitamin (MULTIVITAMIN) tablet [id number] Yes Take 1 tablet by mouth in the morning. [provider]  Active Family Member  pantoprazole  (PROTONIX ) 40 MG tablet [id number] Yes Take 1 tablet (40 mg total) by mouth daily. [name], MD  Active Family Member  Riboflavin (VITAMIN B-2 PO) [id number] Yes Take 1 tablet by mouth daily. [provider]  Active Family Member  sertraline  (ZOLOFT ) 100 MG tablet [id number] Yes Take 1 tablet (100 mg total) by mouth daily. Dettinger, Fonda LABOR, MD  Active Family Member  torsemide  (DEMADEX ) 20 MG tablet [id number] Yes Take 1 tablet (20 mg total) by mouth daily. [name], MD  Active Family Member  traMADol (ULTRAM) 50 MG tablet [id number] Yes Take 50 mg by mouth every 6 (six) hours as needed for moderate pain (pain score 4-6). [provider]  Active Family Member  traZODone  (DESYREL ) 100 MG tablet [id number] Yes Take 1 tablet (100 mg total) by mouth at bedtime as needed. for sleep Dettinger, Fonda LABOR, MD  Active Family Member  Home Care and Equipment/Supplies: Were Home Health Services Ordered?: No Any new equipment or medical supplies ordered?: No  Functional Questionnaire: Do you need assistance with bathing/showering or dressing?: Yes (grab bars) Do you need assistance with meal preparation?: No Do you need assistance with eating?: No Do you have difficulty maintaining  continence: No Do you need assistance with getting out of bed/getting out of a chair/moving?: Yes (walker) Do you have difficulty managing or taking your medications?: Yes (son provides oversight if needed)  Follow up appointments reviewed: PCP Follow-up appointment confirmed?: No (pt declines for RN CM to schedule) MD Provider Line Number:[phone] Given: No Specialist Hospital Follow-up appointment confirmed?: Yes Date of Specialist follow-up appointment?: 08/02/24 Follow-Up Specialty Provider:: [name]   cardiology Do you need transportation to your follow-up appointment?: No Do you understand care options if your condition(s) worsen?: Yes-patient verbalized understanding  SDOH Interventions Today    Flowsheet Row Most Recent Value  SDOH Interventions   Food Insecurity Interventions Intervention Not Indicated  Housing Interventions Intervention Not Indicated  Transportation Interventions Intervention Not Indicated  Utilities Interventions Intervention Not Indicated    Goals Addressed             This Visit's Progress    VBCI Transitions of Care (TOC) Care Plan       Problems:  Recent Hospitalization for treatment of s/p TAVR  Goal:  Over the next 30 days, the patient will not experience hospital readmission  Interventions:  Transitions of Care: Doctor Visits  - discussed the importance of doctor visits Post discharge activity limitations prescribed by provider reviewed Post-op wound/incision care reviewed with patient/caregiver Reviewed Signs and symptoms of infection SDOH screenings completed Pain assessment completed Reviewed safety precautions, importance of using walker  Patient Self Care Activities:  Attend all scheduled provider appointments Call pharmacy for medication refills 3-7 days in advance of running out of medications Call provider office for new concerns or questions  Notify RN Care Manager of [facility] call rescheduling needs Participate in  Transition of Care Program/Attend TOC scheduled calls Take medications as prescribed    Plan:  Telephone follow up appointment with care management team member scheduled for:  08/04/24 @ 245 pm The patient has been provided with contact information for the care management team and has been advised to call with any health related questions or concerns.          [name] [facility], BSN RN Care Manager/ Transition of Care [place]/ [facility] [phone]

## 2024-07-29 NOTE — Telephone Encounter (Addendum)
 Patient contacted regarding discharge from [facility] on 07/28/24  Patient understands to follow up with provider Izetta Hummer, PA-C on 08/02/24 at 9:05AM at [address] Location. Patient understands discharge instructions? Yes Patient understands medications and regiment? Yes Patient understands to bring all medications to this visit? Yes

## 2024-08-02 ENCOUNTER — Ambulatory Visit: Attending: Physician Assistant | Admitting: Physician Assistant

## 2024-08-02 VITALS — BP 130/68 | HR 68 | Ht 72.0 in | Wt 218.0 lb

## 2024-08-02 DIAGNOSIS — J449 Chronic obstructive pulmonary disease, unspecified: Secondary | ICD-10-CM | POA: Diagnosis not present

## 2024-08-02 DIAGNOSIS — I493 Ventricular premature depolarization: Secondary | ICD-10-CM

## 2024-08-02 DIAGNOSIS — Z952 Presence of prosthetic heart valve: Secondary | ICD-10-CM

## 2024-08-02 DIAGNOSIS — I5032 Chronic diastolic (congestive) heart failure: Secondary | ICD-10-CM | POA: Diagnosis not present

## 2024-08-02 DIAGNOSIS — I10 Essential (primary) hypertension: Secondary | ICD-10-CM | POA: Diagnosis not present

## 2024-08-02 DIAGNOSIS — Z8719 Personal history of other diseases of the digestive system: Secondary | ICD-10-CM

## 2024-08-02 MED ORDER — METOPROLOL TARTRATE 25 MG PO TABS: ORAL_TABLET | ORAL | 3 refills | Status: DC

## 2024-08-02 MED ORDER — TORSEMIDE 20 MG PO TABS: 20.0000 mg | ORAL_TABLET | Freq: Every day | ORAL | 3 refills | Status: DC

## 2024-08-02 MED ORDER — ASPIRIN 81 MG PO CHEW: 81.0000 mg | CHEWABLE_TABLET | Freq: Every day | ORAL | 3 refills | Status: AC

## 2024-08-02 MED ORDER — DAPAGLIFLOZIN PROPANEDIOL 10 MG PO TABS: 10.0000 mg | ORAL_TABLET | Freq: Every day | ORAL | 3 refills | Status: AC

## 2024-08-02 NOTE — Progress Notes (Signed)
 [facility] CENTER   MULTIDISCIPLINARY HEART VALVE CLINIC                                     Cardiology Office Note:    Date:  08/02/2024   ID:  Gregory Pearson, DOB 1927-11-09, MRN [id number]  PCP:  Dettinger, Fonda LABOR, MD  [facility] Cardiologist:  Diannah SHAUNNA Maywood, MD  [facility] Structural heart: Ozell Fell, MD [facility] Electrophysiologist:  None   Referring MD: Dettinger, Fonda LABOR, MD   [facility] s/p TAVR  History of Present Illness:    Gregory Pearson is a 89 y.o. male with a hx of HFpEF, PVCs, COPD on home 2L home 02, GERD, prior GI bleeding, HLD, prostate cancer and severe AS s/p TAVR (07/27/24) who presents to clinic for follow up.   He was admitted to [facility] in August 2025 with acute hypoxic respiratory failure and was found to have acute CHF. Echo with LVEF 60-65%. Normal RV function. Severe aortic stenosis with mean gradient 41 mmHg, AVA 0.78 cm2, DI 0.27, SVI 37. He was diuresed with torsemide . He was admitted to [facility] on 05/12/24 with recurrent hypoxic respiratory failure in setting of COPD exacerbation, acute CHF and was also found to have pneumonia. Discharged on home 02.  [facility] 07/27/24 showed no angiographic evidence of CAD; mild elevation right heart pressures. S/p TAVR with a 23 mm Edwards Sapien 3 Ultra Resilia THV via the TF approach on 07/27/24. He had an acute allergic reaction after TAVR treated with IV benadryl  and solumedrol possibly from contrast. Post operative echo showed EF 60%, mod MAC with mod eccentric MR, normally functioning TAVR with a mean gradient of 8 mmHg and no PVL. He was treated with one dose of IV lasix  for elevated LVEDP.   Today the patient presents to clinic for follow up. Here with his son. No CP. He has SOB only with exertion. No LE edema, orthopnea or PND. No dizziness or syncope. No blood in stool or urine. No palpitations. He does feel a bit jittery with hand shaking and poor sleep but these  symptoms preceded TAVR and are about the same. Still has some fatigue but felt like he needs to get back to normal routine. Has noticed an increase in BP since TAVR.      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 Fell and Dr. Daniel   Traumatic hematoma of buttock 01/24/2014     Current Medications: Current Meds  Medication Sig   acetaminophen  (TYLENOL ) 500 MG tablet Take 1 tablet (500 mg total) by mouth every 6 (six) hours as needed.   ascorbic acid (VITAMIN C) 500 MG tablet Take 500 mg by mouth in the morning.   Cyanocobalamin (VITAMIN B-12 PO) Take 1 tablet by  mouth in the morning.   MAGNESIUM  LACTATE PO Take 1 tablet by mouth at bedtime.   Multiple Vitamin (MULTIVITAMIN) tablet Take 1 tablet by mouth in the morning.   Riboflavin (VITAMIN B-2 PO) Take 1 tablet by mouth daily.   sertraline  (ZOLOFT ) 100 MG tablet Take 1 tablet (100 mg total) by mouth daily.   traMADol  (ULTRAM ) 50 MG tablet Take 50 mg by mouth every 6 (six) hours as needed for moderate pain (pain score 4-6).   traZODone  (DESYREL ) 100 MG tablet Take 1 tablet (100 mg total) by mouth at bedtime as needed. for sleep   [DISCONTINUED] aspirin  81 MG chewable tablet Chew 1 tablet (81 mg total) by mouth daily with breakfast.   [DISCONTINUED] dapagliflozin  propanediol (FARXIGA ) 10 MG TABS tablet Take 1 tablet (10 mg total) by mouth daily before breakfast.   [DISCONTINUED] metoprolol  tartrate  (LOPRESSOR ) 25 MG tablet Take 1 tablet (25 mg total) twice a day.HOLD for blood pressure less than 100   [DISCONTINUED] pantoprazole  (PROTONIX ) 40 MG tablet Take 1 tablet (40 mg total) by mouth daily.   [DISCONTINUED] torsemide  (DEMADEX ) 20 MG tablet Take 1 tablet (20 mg total) by mouth daily.      ROS:   Please see the history of present illness.    All other systems reviewed and are negative.  EKGs   EKG Interpretation Date/Time:  Monday August 02 2024 08:58:40 EST Ventricular Rate:  68 PR Interval:  158 QRS Duration:  80 QT Interval:  422 QTC Calculation: 448 R Axis:   20  Text Interpretation: Sinus rhythm with Premature supraventricular complexes and with frequent Premature ventricular complexes When compared with ECG of 28-Jul-2024 04:16, Premature supraventricular complexes are now Present Confirmed by Sebastian Collar [phone]) on 08/02/2024 9:20:39 AM   Risk Assessment/Calculations:           Physical Exam:    VS:  BP 130/68   Pulse 68   Ht 6' (1.829 m)   Wt 218 lb (98.9 kg)   SpO2 97%   BMI 29.57 kg/m     Wt Readings from Last 3 Encounters:  08/02/24 218 lb (98.9 kg)  07/28/24 210 lb 6.4 oz (95.4 kg)  07/08/24 199 lb (90.3 kg)     GEN: Appears younger than stated age, wearing oxygen NECK: No JVD CARDIAC: RRR, soft holosystolic  murmur at apex. No rubs, gallops RESPIRATORY:  Decreased breath sounds at bases with possible crackles ABDOMEN: Soft, non-tender, non-distended EXTREMITIES:  No edema; No deformity.  Groin sites clear without hematoma but bilateral ecchymosis and over pubic bone.   ASSESSMENT:    1. S/P TAVR (transcatheter aortic valve replacement)   2. Chronic diastolic CHF (congestive heart failure) (HCC)   3. Essential hypertension   4. PVC (premature ventricular contraction)   5. History of GI bleed   6. Chronic obstructive pulmonary disease, unspecified COPD type (HCC)     PLAN:    In order of problems listed above:  Severe AS  s/p TAVR:  -- Pt doing okay s/p TAVR.  -- ECG with no HAVB.  -- Groin sites healing well.  -- SBE prophylaxis discussed; the patient is edentulous and does not go to the dentist.  -- Continue Aspirin  81mg  daily. -- Cleared to resume all activities without restriction. -- Will be seen back next month for echo and [place] in [place] by [employer].   Chronic HFpEF: -- Appears euvolemic, but decreased breath sounds at bases.  -- Continue torsemide  20mg  daily and  Farixga 10mg  daily.    -- Given continued DOE, check BNP, BMET, CBC.   HTN: -- Has noticed an increase in BP since TAVR, but BP overall still well controlled.  -- Continue Lopressor  25mg  BID and torsemide  20mg  daily.      PVCs: -- Continue lopressor  25mg  BID.    Hx of GI bleeding: -- Hg has remained stable.  -- CBC today with slight drop in Hg s/p TAVR.  COPD: -- Continue home 02.    Medication Adjustments/Labs and Tests Ordered: Current medicines are reviewed at length with the patient today.  Concerns regarding medicines are outlined above.  Orders Placed This Encounter  Procedures   Basic Metabolic Panel (BMET)   B Nat Peptide   CBC   EKG 12-Lead   ECHOCARDIOGRAM COMPLETE   Meds ordered this encounter  Medications   aspirin  81 MG chewable tablet    Sig: Chew 1 tablet (81 mg total) by mouth daily with breakfast.    Dispense:  90 tablet    Refill:  3   dapagliflozin  propanediol (FARXIGA ) 10 MG TABS tablet    Sig: Take 1 tablet (10 mg total) by mouth daily before breakfast.    Dispense:  90 tablet    Refill:  3   metoprolol  tartrate (LOPRESSOR ) 25 MG tablet    Sig: Take 1 tablet (25 mg total) twice a day.HOLD for blood pressure less than 100    Dispense:  180 tablet    Refill:  3   torsemide  (DEMADEX ) 20 MG tablet    Sig: Take 1 tablet (20 mg total) by mouth daily.    Dispense:  90 tablet    Refill:  3    Patient Instructions  Medication Instructions:  Your physician has recommended you  make the following change in your medication:  STOP taking pantoloprazole Refills of aspirin , Farxiga , metoprolol , and torsemide  sent to your pharamcy.   *If you need a refill on your cardiac medications before your next appointment, please call your pharmacy*  Lab Work: TODAY BMET, BNP, and CBC If you have labs (blood work) drawn today and your tests are completely normal, you will receive your results only by: MyChart Message (if you have MyChart) OR A paper copy in the mail If you have any lab test that is abnormal or we need to change your treatment, we will call you to review the results.  Testing/Procedures: 09/01/24 Your physician has requested that you have an echocardiogram. Echocardiography is a painless test that uses sound waves to create images of your heart. It provides your doctor with information about the size and shape of your heart and how well your heart's chambers and valves are working. This procedure takes approximately one hour. There are no restrictions for this procedure. Please do NOT wear cologne, perfume, aftershave, or lotions (deodorant is allowed). Please arrive 15 minutes prior to your appointment time.  Please note: We ask at that you not bring children with you during ultrasound (echo/ vascular) testing. Due to room size and safety concerns, children are not allowed in the ultrasound rooms during exams. Our front office staff cannot provide observation of children in our lobby area while testing is being conducted. An adult accompanying a patient to their appointment will only be allowed in the ultrasound room at the discretion of the ultrasound technician under special circumstances. We apologize for any inconvenience.   Follow-Up: At [facility], you and your health needs are our priority.  As part of our  continuing mission to provide you with exceptional heart care, our providers are all part of one team.  This team includes your primary Cardiologist  (physician) and Advanced Practice Providers or APPs (Physician Assistants and Nurse Practitioners) who all work together to provide you with the care you need, when you need it.  Your next appointment:   As scheduled on 09/02/24  Provider:   [name], PA-C  We recommend signing up for the patient portal called MyChart.  Sign up information is provided on this After Visit Summary.  MyChart is used to connect with patients for Virtual Visits (Telemedicine).  Patients are able to view lab/test results, encounter notes, upcoming appointments, etc.  Non-urgent messages can be sent to your provider as well.   To learn more about what you can do with MyChart, go to [email or web address].           Signed, [name], PA-C  08/02/2024 9:36 AM    [facility]

## 2024-08-02 NOTE — Patient Instructions (Signed)
 Medication Instructions:  Your physician has recommended you make the following change in your medication:  STOP taking pantoloprazole Refills of aspirin , Farxiga , metoprolol , and torsemide  sent to your pharamcy.   *If you need a refill on your cardiac medications before your next appointment, please call your pharmacy*  Lab Work: TODAY BMET, BNP, and CBC If you have labs (blood work) drawn today and your tests are completely normal, you will receive your results only by: MyChart Message (if you have MyChart) OR A paper copy in the mail If you have any lab test that is abnormal or we need to change your treatment, we will call you to review the results.  Testing/Procedures: 09/01/24 Your physician has requested that you have an echocardiogram. Echocardiography is a painless test that uses sound waves to create images of your heart. It provides your doctor with information about the size and shape of your heart and how well your heart's chambers and valves are working. This procedure takes approximately one hour. There are no restrictions for this procedure. Please do NOT wear cologne, perfume, aftershave, or lotions (deodorant is allowed). Please arrive 15 minutes prior to your appointment time.  Please note: We ask at that you not bring children with you during ultrasound (echo/ vascular) testing. Due to room size and safety concerns, children are not allowed in the ultrasound rooms during exams. Our front office staff cannot provide observation of children in our lobby area while testing is being conducted. An adult accompanying a patient to their appointment will only be allowed in the ultrasound room at the discretion of the ultrasound technician under special circumstances. We apologize for any inconvenience.   Follow-Up: At [facility], you and your health needs are our priority.  As part of our continuing mission to provide you with exceptional heart care, our providers are all  part of one team.  This team includes your primary Cardiologist (physician) and Advanced Practice Providers or APPs (Physician Assistants and Nurse Practitioners) who all work together to provide you with the care you need, when you need it.  Your next appointment:   As scheduled on 09/02/24  Provider:   [name], PA-C  We recommend signing up for the patient portal called MyChart.  Sign up information is provided on this After Visit Summary.  MyChart is used to connect with patients for Virtual Visits (Telemedicine).  Patients are able to view lab/test results, encounter notes, upcoming appointments, etc.  Non-urgent messages can be sent to your provider as well.   To learn more about what you can do with MyChart, go to [email or web address].

## 2024-08-04 ENCOUNTER — Ambulatory Visit: Payer: Self-pay | Admitting: Physician Assistant

## 2024-08-04 ENCOUNTER — Other Ambulatory Visit: Payer: Self-pay | Admitting: *Deleted

## 2024-08-04 LAB — CBC
Hematocrit: 32.8 % — ABNORMAL LOW (ref 37.5–51.0)
Hemoglobin: 9.7 g/dL — ABNORMAL LOW (ref 13.0–17.7)
MCH: 25.3 pg — ABNORMAL LOW (ref 26.6–33.0)
MCHC: 29.6 g/dL — ABNORMAL LOW (ref 31.5–35.7)
MCV: 85 fL (ref 79–97)
Platelets: 183 x10E3/uL (ref 150–450)
RBC: 3.84 x10E6/uL — ABNORMAL LOW (ref 4.14–5.80)
RDW: 14.5 % (ref 11.6–15.4)
WBC: 8.7 x10E3/uL (ref 3.4–10.8)

## 2024-08-04 LAB — BASIC METABOLIC PANEL WITH GFR
BUN/Creatinine Ratio: 23 (ref 10–24)
BUN: 19 mg/dL (ref 10–36)
CO2: 22 mmol/L (ref 20–29)
Calcium: 9 mg/dL (ref 8.6–10.2)
Chloride: 100 mmol/L (ref 96–106)
Creatinine, Ser: 0.83 mg/dL (ref 0.76–1.27)
Glucose: 100 mg/dL — ABNORMAL HIGH (ref 70–99)
Potassium: 4.8 mmol/L (ref 3.5–5.2)
Sodium: 136 mmol/L (ref 134–144)
eGFR: 81 mL/min/1.73 (ref >59)

## 2024-08-04 LAB — BRAIN NATRIURETIC PEPTIDE: BNP: 724.4 pg/mL — AB (ref 0.0–100.0)

## 2024-08-04 NOTE — Patient Outreach (Signed)
 Transition of Care week 2  Visit Note  08/04/2024  Name: Gregory Pearson MRN: [id number]          DOB: [birth year]  Situation: Patient enrolled in [facility] 30-day program. Visit completed with patient by telephone.   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 Wonda and Dr. Daniel   Traumatic hematoma of buttock 01/24/2014    Assessment: Patient Reported Symptoms: Cognitive Cognitive Status: No symptoms reported, Able to follow simple commands, Alert and oriented to person, place, and time, Normal speech and language skills      Neurological Neurological Review of Symptoms: No symptoms reported    HEENT HEENT Symptoms Reported: No symptoms reported      Cardiovascular Cardiovascular Symptoms Reported: No symptoms reported Does patient have uncontrolled Hypertension?: No Weight: 208 lb (94.3 kg)  Respiratory Respiratory Symptoms Reported: Shortness of breath Other Respiratory Symptoms: dyspnea with exertion only Respiratory Management Strategies: Adequate rest, Routine screening Respiratory Self-Management Outcome: 4 (good)  Endocrine Endocrine Symptoms Reported: No symptoms reported Is patient diabetic?: No    Gastrointestinal Gastrointestinal Symptoms Reported: No symptoms reported      Genitourinary Genitourinary Symptoms Reported: No symptoms  reported    Integumentary Integumentary Symptoms Reported: Incision Additional Integumentary Details: pt reports incision is unremarkable Skin Management Strategies: Routine screening, Adequate rest Skin Self-Management Outcome: 4 (good) Skin Comment: reinforced signs/ symptoms of infection  Musculoskeletal Musculoskelatal Symptoms Reviewed: Limited mobility Additional Musculoskeletal Details: walker Musculoskeletal Management Strategies: Adequate rest, Medical device Musculoskeletal Self-Management Outcome: 4 (good) Musculoskeletal Comment: reinforced safety precautions      Psychosocial Psychosocial Symptoms Reported: No symptoms reported         Today's Vitals   08/04/24 1321  Weight: 208 lb (94.3 kg)   Pain Scale: 0-10 Pain Score: 0-No pain  Medications Reviewed Today     Reviewed by [name] LABOR, RN (Registered Nurse) on 08/04/24 at 1326  Med List Status: <None>   Medication Order Taking? Sig Documenting Provider Last Dose Status Informant  acetaminophen  (TYLENOL ) 500 MG tablet [id number]  Take 1 tablet (500 mg total) by mouth every 6 (six) hours as needed. [name] HERO, NP  Active Family Member  ascorbic acid (VITAMIN C) 500 MG tablet [id number]  Take 500 mg by mouth in the morning. [provider]  Active Family Member  aspirin  81 MG chewable tablet [id number]  Chew 1 tablet (81 mg total) by mouth daily with breakfast. Sebastian Lamarr SAUNDERS, PA-C  Active   Cyanocobalamin (VITAMIN B-12 PO) [id number]  Take 1 tablet by mouth in the morning. [provider]  Active Family Member  dapagliflozin  propanediol (FARXIGA ) 10 MG TABS tablet [id number]  Take 1 tablet (10 mg total) by mouth daily before breakfast. Sebastian Lamarr SAUNDERS, PA-C  Active   MAGNESIUM  LACTATE PO [id number]  Take 1 tablet by mouth at bedtime. [provider]  Active Family Member  metoprolol  tartrate (LOPRESSOR ) 25 MG tablet [id number]  Take 1 tablet (25 mg total) twice a day.HOLD for  blood pressure less than 100 Sebastian Lamarr SAUNDERS, PA-C  Active   Multiple Vitamin (MULTIVITAMIN) tablet [id number]  Take 1 tablet by mouth in the morning. [provider]  Active Family Member  Riboflavin (VITAMIN B-2 PO) [id number]  Take 1 tablet by mouth daily. [provider]  Active Family Member  sertraline  (ZOLOFT ) 100 MG tablet [id number]  Take 1 tablet (100 mg total) by mouth daily. Dettinger, Fonda LABOR, MD  Active Family Member  torsemide  (DEMADEX ) 20 MG tablet [id number]  Take 1 tablet (20 mg total) by mouth daily. Sebastian Lamarr SAUNDERS, PA-C  Active   traMADol  (ULTRAM ) 50 MG tablet [id number]  Take 50 mg by mouth every 6 (six) hours as needed for moderate pain (pain score 4-6). [provider]  Active Family Member  traZODone  (DESYREL ) 100 MG tablet [id number]  Take 1 tablet (100 mg total) by mouth at bedtime as needed. for sleep Dettinger, Fonda LABOR, MD  Active Family Member            Goals Addressed             This Visit's Progress    VBCI Transitions of Care (TOC) Care Plan       Problems:  Recent Hospitalization for treatment of s/p TAVR Pt saw cardiologist 08/02/24- states  I'm doing very well  Goal:  Over the next 30 days, the patient will not experience hospital readmission  Interventions:  Transitions of Care: Doctor Visits  - discussed the importance of doctor visits Post discharge activity limitations prescribed by provider reviewed Post-op wound/incision care reviewed with patient/caregiver Reviewed Signs and symptoms of infection SDOH screenings completed Pain assessment completed Reinforced safety precautions, importance of using walker Reviewed all upcoming scheduled appointments  Patient Self Care Activities:  Attend all scheduled provider appointments Call pharmacy for medication refills 3-7 days in advance of running out of medications Call provider office for new concerns or questions  Notify RN Care Manager of [facility] call  rescheduling needs Participate in Transition of Care Program/Attend TOC scheduled calls Take medications as prescribed    Plan:  Telephone outreach- 08/11/24 @ 115 pm         Recommendation:   PCP Follow-up  Follow Up Plan:   Telephone follow-up 08/11/24 @ 115 pm  [name] [facility], BSN RN Care Manager/ Transition of Care [place]/ [facility] [phone]

## 2024-08-04 NOTE — Patient Instructions (Signed)
 Visit Information  Thank you for taking time to visit with me today. Please don't hesitate to contact me if I can be of assistance to you before our next scheduled telephone appointment.  Our next appointment is by telephone on 08/11/24 @ 115 pm  Following is a copy of your care plan:   Goals Addressed             This Visit's Progress    VBCI Transitions of Care (TOC) Care Plan       Problems:  Recent Hospitalization for treatment of s/p TAVR Pt saw cardiologist 08/02/24- states  I'm doing very well  Goal:  Over the next 30 days, the patient will not experience hospital readmission  Interventions:  Transitions of Care: Doctor Visits  - discussed the importance of doctor visits Post discharge activity limitations prescribed by provider reviewed Post-op wound/incision care reviewed with patient/caregiver Reviewed Signs and symptoms of infection SDOH screenings completed Pain assessment completed Reinforced safety precautions, importance of using walker Reviewed all upcoming scheduled appointments  Patient Self Care Activities:  Attend all scheduled provider appointments Call pharmacy for medication refills 3-7 days in advance of running out of medications Call provider office for new concerns or questions  Notify RN Care Manager of TOC call rescheduling needs Participate in Transition of Care Program/Attend TOC scheduled calls Take medications as prescribed    Plan:  Telephone outreach- 08/11/24 @ 115 pm        Care plan and visit instructions communicated with the patient verbally today. Patient agrees to receive a copy in MyChart. Active MyChart status and patient understanding of how to access instructions and care plan via MyChart confirmed with patient.     Telephone follow up appointment with care management team member scheduled for: 08/11/24 @ 115 pm  Please call the care guide team at [phone] if you need to cancel or reschedule your appointment.    Please call the Suicide and Crisis Lifeline: 988 call the [place]  National Suicide Prevention Lifeline: (302)703-4710 or TTY: 347 232 8298 TTY 616 793 6271) to talk to a trained counselor call 1-800-273-TALK (toll free, 24 hour hotline) go to [facility] [address] [place] [phone]) call the [facility] Crisis Line: [phone] call 911 if you are experiencing a Mental Health or Behavioral Health Crisis or need someone to talk to.  [name] [facility], BSN RN Care Manager/ Transition of Care [place]/ [facility] [phone]

## 2024-08-09 ENCOUNTER — Ambulatory Visit: Admitting: Physician Assistant

## 2024-08-10 ENCOUNTER — Other Ambulatory Visit: Payer: Self-pay | Admitting: Family Medicine

## 2024-08-10 DIAGNOSIS — F5104 Psychophysiologic insomnia: Secondary | ICD-10-CM

## 2024-08-11 ENCOUNTER — Other Ambulatory Visit: Admitting: *Deleted

## 2024-08-11 NOTE — Patient Instructions (Signed)
 Visit Information  Thank you for taking time to visit with me today. Please don't hesitate to contact me if I can be of assistance to you before our next scheduled telephone appointment.  Our next appointment is by telephone on 08/25/24 @ 930 am  Following is a copy of your care plan:   Goals Addressed             This Visit's Progress    VBCI Transitions of Care (TOC) Care Plan       Problems:  Recent Hospitalization for treatment of s/p TAVR Pt saw cardiologist 08/02/24- states  I'm doing very well 08/11/24- pt reports doing great   goal is to go back dancing  pt checks oxygen saturation 95-96%   weight 209 pounds  Goal:  Over the next 30 days, the patient will not experience hospital readmission  Interventions:  Transitions of Care: Doctor Visits  - discussed the importance of doctor visits Reviewed Signs and symptoms of infection Pain assessment completed Reviewed safety precautions, importance of using walker as needed Reviewed all upcoming scheduled appointments Reviewed HF action plan, importance of continuing daily weights and keeping a log  Patient Self Care Activities:  Attend all scheduled provider appointments Call pharmacy for medication refills 3-7 days in advance of running out of medications Call provider office for new concerns or questions  Notify RN Care Manager of TOC call rescheduling needs Participate in Transition of Care Program/Attend TOC scheduled calls Take medications as prescribed    Plan:  Telephone outreach- 08/25/24 @ 930 am        Care plan and visit instructions communicated with the patient verbally today. Patient agrees to receive a copy in MyChart. Active MyChart status and patient understanding of how to access instructions and care plan via MyChart confirmed with patient.     Telephone follow up appointment with care management team member scheduled for: 08/25/24 @ 930 am  Please call the care guide team at [phone] if  you need to cancel or reschedule your appointment.   Please call the Suicide and Crisis Lifeline: 988 call the [place]  National Suicide Prevention Lifeline: 669 690 8667 or TTY: 705-741-5360 TTY 340 400 2484) to talk to a trained counselor call 1-800-273-TALK (toll free, 24 hour hotline) go to [facility] [address] [place] [phone]) call the [facility] Crisis Line: [phone] call 911 if you are experiencing a Mental Health or Behavioral Health Crisis or need someone to talk to.  [name] [facility], BSN RN Care Manager/ Transition of Care [place]/ [facility] [phone]

## 2024-08-11 NOTE — Patient Outreach (Addendum)
 Transition of Care week 3  Visit Note  08/11/2024  Name: Gregory Pearson MRN: [id number]          DOB: 1928-03-17  Situation: Patient enrolled in [facility] 30-day program. Visit completed with patient by telephone.   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 Wonda and Dr. Daniel   Traumatic hematoma of buttock 01/24/2014    Assessment: Patient Reported Symptoms: Cognitive Cognitive Status: No symptoms reported, Alert and oriented to person, place, and time, Able to follow simple commands, Normal speech and language skills      Neurological Neurological Review of Symptoms: No symptoms reported    HEENT HEENT Symptoms Reported: No symptoms reported      Cardiovascular Cardiovascular Symptoms Reported: No symptoms reported Does patient have uncontrolled Hypertension?: No Weight: 198 lb (89.8 kg)  Respiratory Respiratory Symptoms Reported: No symptoms reported    Endocrine Endocrine Symptoms Reported: No symptoms reported Is patient diabetic?: No    Gastrointestinal Gastrointestinal Symptoms Reported: No symptoms reported      Genitourinary Genitourinary Symptoms Reported: No symptoms reported    Integumentary Integumentary Symptoms Reported: Incision Additional Integumentary Details: pt reports incision is unremarkable Skin Management  Strategies: Routine screening, Adequate rest Skin Self-Management Outcome: 4 (good) Skin Comment: reviewed signs /symptoms of infection  Musculoskeletal Musculoskelatal Symptoms Reviewed: Limited mobility Additional Musculoskeletal Details: walker- uses only if needed,  states is walking much better Musculoskeletal Management Strategies: Adequate rest, Medical device Musculoskeletal Self-Management Outcome: 4 (good) Musculoskeletal Comment: reviewed safety precautions      Psychosocial Psychosocial Symptoms Reported: No symptoms reported         Today's Vitals   08/11/24 1322  SpO2: 95%  Weight: 198 lb (89.8 kg)   Pain Scale: 0-10 Pain Score: 0-No pain  Medications Reviewed Today     Reviewed by [name] LABOR, RN (Registered Nurse) on 08/11/24 at 1320  Med List Status: <None>   Medication Order Taking? Sig Documenting Provider Last Dose Status Informant  acetaminophen  (TYLENOL ) 500 MG tablet [id number]  Take 1 tablet (500 mg total) by mouth every 6 (six) hours as needed. [name] HERO, NP  Active Family Member  ascorbic acid (VITAMIN C) 500 MG tablet [id number]  Take 500 mg by mouth in the morning. [provider]  Active Family Member  aspirin  81 MG chewable tablet [id number]  Chew 1 tablet (81 mg total) by mouth daily with breakfast. Sebastian Lamarr SAUNDERS, PA-C  Active   Cyanocobalamin (VITAMIN B-12 PO) [id number]  Take 1 tablet by mouth in the morning. [provider]  Active Family Member  dapagliflozin  propanediol (FARXIGA ) 10 MG TABS tablet [id number]  Take 1 tablet (10 mg total) by mouth daily before breakfast. Sebastian Lamarr SAUNDERS, PA-C  Active   MAGNESIUM  LACTATE PO [id number]  Take 1 tablet by  mouth at bedtime. [provider]  Active Family Member  metoprolol  tartrate (LOPRESSOR ) 25 MG tablet [id number]  Take 1 tablet (25 mg total) twice a day.HOLD for blood pressure less than 100 Sebastian Lamarr SAUNDERS, PA-C  Active   Multiple Vitamin (MULTIVITAMIN)  tablet [id number]  Take 1 tablet by mouth in the morning. [provider]  Active Family Member  Riboflavin (VITAMIN B-2 PO) [id number]  Take 1 tablet by mouth daily. [provider]  Active Family Member  sertraline  (ZOLOFT ) 100 MG tablet [id number]  Take 1 tablet (100 mg total) by mouth daily. Dettinger, Fonda LABOR, MD  Active Family Member  torsemide  (DEMADEX ) 20 MG tablet [id number]  Take 1 tablet (20 mg total) by mouth daily. Sebastian Lamarr SAUNDERS, PA-C  Active   traMADol  (ULTRAM ) 50 MG tablet [id number]  Take 50 mg by mouth every 6 (six) hours as needed for moderate pain (pain score 4-6). [provider]  Active Family Member  traZODone  (DESYREL ) 100 MG tablet [id number]  TAKE 1 TABLET BY MOUTH AT  BEDTIME AS NEEDED FOR SLEEP Dettinger, Fonda LABOR, MD  Active             Goals Addressed             This Visit's Progress    VBCI Transitions of Care (TOC) Care Plan       Problems:  Recent Hospitalization for treatment of s/p TAVR Pt saw cardiologist 08/02/24- states  I'm doing very well 08/11/24- pt reports doing great   goal is to go back dancing  pt checks oxygen saturation 95-96%   weight 209 pounds  Goal:  Over the next 30 days, the patient will not experience hospital readmission  Interventions:  Transitions of Care: Doctor Visits  - discussed the importance of doctor visits Reviewed Signs and symptoms of infection Pain assessment completed Reviewed safety precautions, importance of using walker as needed Reviewed all upcoming scheduled appointments Reviewed HF action plan, importance of continuing daily weights and keeping a log  Patient Self Care Activities:  Attend all scheduled provider appointments Call pharmacy for medication refills 3-7 days in advance of running out of medications Call provider office for new concerns or questions  Notify RN Care Manager of TOC call rescheduling needs Participate in Transition of Care Program/Attend TOC  scheduled calls Take medications as prescribed    Plan:  Telephone outreach- 08/25/24 @ 930 am         Recommendation:   PCP Follow-up  Follow Up Plan:   Telephone follow-up 08/25/24 @ 930 am, will not outreach pt week of 08/16/25 (Christmas holiday), pt agreeable to plan , will outreach week of 08/23/24  [name] [facility], BSN Care Manager/ Transition of Care [place]/ [facility] [phone]

## 2024-08-25 ENCOUNTER — Other Ambulatory Visit: Payer: Self-pay | Admitting: *Deleted

## 2024-08-25 NOTE — Patient Outreach (Signed)
 " Transition of Care week 4  Visit Note  08/25/2024  Name: Gregory Pearson MRN: [id number]          DOB: [birth year]  Situation: Patient enrolled in [facility] 30-day program. Visit completed with patient by telephone.   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 Wonda and Dr. Daniel   Traumatic hematoma of buttock 01/24/2014    Assessment: Patient Reported Symptoms: Cognitive Cognitive Status: No symptoms reported, Alert and oriented to person, place, and time, Able to follow simple commands, Normal speech and language skills      Neurological Neurological Review of Symptoms: No symptoms reported    HEENT HEENT Symptoms Reported: No symptoms reported      Cardiovascular Cardiovascular Symptoms Reported: No symptoms reported Does patient have uncontrolled Hypertension?: No Weight: 205 lb (93 kg) Cardiovascular Self-Management Outcome: 4 (good) Cardiovascular Comment: s/p TAVR, pt states  doing well  no concerns reported  Respiratory Respiratory Symptoms Reported: Shortness of breath Other Respiratory Symptoms: dyspnea with exertion only Respiratory Management Strategies: Adequate rest, Routine screening Respiratory Self-Management Outcome: 4 (good)  Endocrine Endocrine Symptoms Reported: No symptoms reported Is patient diabetic?: No     Gastrointestinal Gastrointestinal Symptoms Reported: No symptoms reported      Genitourinary Genitourinary Symptoms Reported: No symptoms reported    Integumentary Integumentary Symptoms Reported: Incision Additional Integumentary Details: pt reports incision is unremarkable Skin Management Strategies: Routine screening, Adequate rest Skin Self-Management Outcome: 4 (good) Skin Comment: reinforced signs / symptoms of infection  Musculoskeletal Musculoskelatal Symptoms Reviewed: Limited mobility Additional Musculoskeletal Details: walker- uses only if needed,  states  I'm getting stronger Musculoskeletal Management Strategies: Adequate rest, Medical device Musculoskeletal Self-Management Outcome: 4 (good) Musculoskeletal Comment: reinforced safety precautions      Psychosocial Psychosocial Symptoms Reported: No symptoms reported         Today's Vitals   08/25/24 0926  Weight: 205 lb (93 kg)   Pain Scale: 0-10 Pain Score: 0-No pain  Medications Reviewed Today     Reviewed by [name] LABOR, RN (Registered Nurse) on 08/25/24 at [phone]  Med List Status: <None>   Medication Order Taking? Sig Documenting Provider Last Dose Status Informant  acetaminophen  (TYLENOL ) 500 MG tablet [id number]  Take 1 tablet (500 mg total) by mouth every 6 (six) hours as needed. [name] HERO, NP  Active Family Member  ascorbic acid (VITAMIN C) 500 MG tablet [id number]  Take 500 mg by mouth in the morning. [provider]  Active Family Member  aspirin  81 MG chewable tablet [id number]  Chew 1 tablet (81 mg total) by mouth daily with breakfast. Sebastian Lamarr SAUNDERS, PA-C  Active   Cyanocobalamin (VITAMIN B-12 PO) [id number]  Take 1 tablet by mouth in the morning. [provider]  Active Family Member  dapagliflozin  propanediol (FARXIGA )  10 MG TABS tablet [id number]  Take 1 tablet (10 mg total) by mouth daily before breakfast. Sebastian Lamarr SAUNDERS, PA-C  Active   MAGNESIUM  LACTATE PO  [id number]  Take 1 tablet by mouth at bedtime. [provider]  Active Family Member  metoprolol  tartrate (LOPRESSOR ) 25 MG tablet [id number]  Take 1 tablet (25 mg total) twice a day.HOLD for blood pressure less than 100 Sebastian Lamarr SAUNDERS, PA-C  Active   Multiple Vitamin (MULTIVITAMIN) tablet [id number]  Take 1 tablet by mouth in the morning. [provider]  Active Family Member  Riboflavin (VITAMIN B-2 PO) [id number]  Take 1 tablet by mouth daily. [provider]  Active Family Member  sertraline  (ZOLOFT ) 100 MG tablet [id number]  Take 1 tablet (100 mg total) by mouth daily. Dettinger, Fonda LABOR, MD  Active Family Member  torsemide  (DEMADEX ) 20 MG tablet [id number]  Take 1 tablet (20 mg total) by mouth daily. Sebastian Lamarr SAUNDERS, PA-C  Active   traMADol  (ULTRAM ) 50 MG tablet [id number]  Take 50 mg by mouth every 6 (six) hours as needed for moderate pain (pain score 4-6). [provider]  Active Family Member  traZODone  (DESYREL ) 100 MG tablet [id number]  TAKE 1 TABLET BY MOUTH AT  BEDTIME AS NEEDED FOR SLEEP Dettinger, Fonda LABOR, MD  Active             Goals Addressed             This Visit's Progress    COMPLETED: VBCI Transitions of Care (TOC) Care Plan       Problems:  Recent Hospitalization for treatment of s/p TAVR Pt saw cardiologist 08/02/24- states  I'm doing very well 08/11/24- pt reports doing great   goal is to go back dancing  pt checks oxygen saturation 95-96%   weight 209 pounds 08/25/24- spoke with pt who reports  I'm doing well, going to a wedding later today  pt states he is more active, has walker and uses as needed, uses oxygen prn, pt to follow up with cardiologist 09/02/24  Goal:  Over the next 30 days, the patient will not experience hospital readmission  Interventions:  Transitions of Care: Doctor Visits  - discussed the importance of doctor visits Reviewed Signs and symptoms of infection Pain assessment completed Reviewed  HF action plan Reinforced safety precautions, importance of using walker as needed Reviewed all upcoming scheduled appointments including Echo 09/01/24, cardiologist 09/02/24 Reinforced HF action plan, importance of continuing daily weights and keeping a log Reviewed plan of care with pt including case closure, pt does not feel he needs any further follow up  Patient Self Care Activities:  Attend all scheduled provider appointments Call pharmacy for medication refills 3-7 days in advance of running out of medications Call provider office for new concerns or questions  Notify RN Care Manager of [facility] call rescheduling needs Participate in Transition of Care Program/Attend [facility] scheduled calls Take medications as prescribed   Case closure  Plan:  TOC case closure         Recommendation:   PCP Follow-up Specialty provider follow-up cardiology 09/02/24  Follow Up Plan:   Closing From:  Transitions of Care Program  [name] [facility], BSN RN Care Manager/ Transition of Care [place]/ [facility] [phone]     "

## 2024-08-25 NOTE — Patient Instructions (Signed)
 Visit Information  Thank you for taking time to visit with me today. Please don't hesitate to contact me if I can be of assistance to you before our next scheduled telephone appointment.   Following is a copy of your care plan:   Goals Addressed             This Visit's Progress    COMPLETED: VBCI Transitions of Care (TOC) Care Plan       Problems:  Recent Hospitalization for treatment of s/p TAVR Pt saw cardiologist 08/02/24- states  I'm doing very well 08/11/24- pt reports doing great   goal is to go back dancing  pt checks oxygen saturation 95-96%   weight 209 pounds 08/25/24- spoke with pt who reports  I'm doing well, going to a wedding later today  pt states he is more active, has walker and uses as needed, uses oxygen prn, pt to follow up with cardiologist 09/02/24  Goal:  Over the next 30 days, the patient will not experience hospital readmission  Interventions:  Transitions of Care: Doctor Visits  - discussed the importance of doctor visits Reviewed Signs and symptoms of infection Pain assessment completed Reviewed HF action plan Reinforced safety precautions, importance of using walker as needed Reviewed all upcoming scheduled appointments including Echo 09/01/24, cardiologist 09/02/24 Reinforced HF action plan, importance of continuing daily weights and keeping a log Reviewed plan of care with pt including case closure, pt does not feel he needs any further follow up  Patient Self Care Activities:  Attend all scheduled provider appointments Call pharmacy for medication refills 3-7 days in advance of running out of medications Call provider office for new concerns or questions  Notify RN Care Manager of TOC call rescheduling needs Participate in Transition of Care Program/Attend TOC scheduled calls Take medications as prescribed   Case closure  Plan:  TOC case closure        Care plan and visit instructions communicated with the patient verbally today.  Patient agrees to receive a copy in MyChart. Active MyChart status and patient understanding of how to access instructions and care plan via MyChart confirmed with patient.     Telephone follow up appointment with care management team member scheduled for:  Please call the care guide team at [phone] if you need to cancel or reschedule your appointment.   Please call the Suicide and Crisis Lifeline: 988 call the [place]  National Suicide Prevention Lifeline: 878 207 2651 or TTY: 732 713 9376 TTY (463) 411-6932) to talk to a trained counselor call 1-800-273-TALK (toll free, 24 hour hotline) go to [facility] [address], [place] [phone]) call the [facility] Crisis Line: ([phone] call 911 if you are experiencing a Mental Health or Behavioral Health Crisis or need someone to talk to.  [name] [facility], BSN RN Care Manager/ Transition of Care [place]/ [facility] [phone]

## 2024-09-01 ENCOUNTER — Ambulatory Visit (HOSPITAL_COMMUNITY)
Admission: RE | Admit: 2024-09-01 | Discharge: 2024-09-01 | Disposition: A | Discharge status: Home / Self Care | Source: Ambulatory Visit | Attending: Family Medicine | Admitting: Family Medicine

## 2024-09-01 DIAGNOSIS — Z952 Presence of prosthetic heart valve: Secondary | ICD-10-CM | POA: Insufficient documentation

## 2024-09-01 LAB — ECHOCARDIOGRAM COMPLETE
AR max vel: 1.02 cm2
AV Area VTI: 1.03 cm2
AV Area mean vel: 1 cm2
AV Mean grad: 15.8 mmHg
AV Peak grad: 26.4 mmHg
Ao pk vel: 2.57 m/s
Area-P 1/2: 3.48 cm2
Calc EF: 59.3 %
S' Lateral: 3.5 cm
Single Plane A2C EF: 67 %
Single Plane A4C EF: 57.7 %

## 2024-09-01 NOTE — Progress Notes (Signed)
 "  Cardiology Office Note    Date:  09/03/2024  ID:  Gregory Pearson, DOB [birth year], MRN [id number] Cardiologist: Diannah SHAUNNA Maywood, MD Structural Heart:  Ozell Fell, MD{ : History of Present Illness:    Gregory Pearson is a 89 y.o. male with past medical history of chronic HFpEF, aortic stenosis, PVC's and history of GIB who presents to the office today for 38-month follow-up.  He was examined by myself in 05/2024 and had recently been admitted multiple times for recurrent CHF exacerbations. Echocardiogram imaging had shown a preserved EF but was noted to have severe AS with mean gradient 40.7 mmHg. He was referred to the [facility] as while he was 89 years old, he was still functional for his age and performed ADL's independently. He underwent workup for TAVR and this was performed by Dr. Fell and Dr. Daniel on 07/27/2024 using an Celestia Stanley 3 Ultra Resilia THV. He progressed well in the postoperative setting except for having a widespread rash and hypotension which was felt to be due to administration of contrast. This quickly resolved with Albumin , IV Benadryl  and Solu-Medrol . From a cardiac perspective, he was discharged on ASA 81 mg daily, Farxiga  10 mg daily, Lopressor  25 mg twice daily and torsemide  20 mg daily.  He did follow-up with Izetta Hummer, PA in 08/02/2024 and reported having some shortness of breath with exertion but no chest pain or palpitations. No changes were made to his medical therapy at that time.  He did have a repeat echocardiogram on 09/02/2023 which showed a preserved EF of 60 to 65% function, moderately elevated PASP at 50.3 mmHg, mild MR, moderate to severe MAC, mild to moderate TR and no paravalvular regurgitation.  In talking with the patient and his son today, he reports overall doing well since his last office visit. He is still having NYHA Class II dyspnea but this continues to improve. He has resumed going dancing a few days a week and says he is  only dancing to the slow songs. He denies any recent chest pain or palpitations. No specific orthopnea or PND. Does experience intermittent lower extremity edema and he has been trying to limit his sodium intake. He does take Torsemide  20 mg daily while remaining on Farxiga  10 mg daily.  Studies Reviewed:   EKG: EKG is ordered today and demonstrates:   EKG Interpretation Date/Time:  Thursday September 02 2024 10:41:48 EST Ventricular Rate:  71 PR Interval:  156 QRS Duration:  92 QT Interval:  448 QTC Calculation: 486 R Axis:   17  Text Interpretation: Sinus rhythm with frequent and consecutive Premature ventricular complexes Prolonged QT When compared with ECG of 02-Aug-2024 08:58, Premature supraventricular complexes are no longer Present Confirmed by Johnson Grate ([id number]) on 09/02/2024 10:43:10 AM       Cardiac Catheterization: 05/2024 No angiographic evidence of CAD Mild elevation right heart pressures   Continue workup for TAVR  Echocardiogram: 08/2024 IMPRESSIONS     1. Left ventricular ejection fraction, by estimation, is 60 to 65%. The  left ventricle has normal function. The left ventricle has no regional  wall motion abnormalities. There is mild concentric left ventricular  hypertrophy. Left ventricular diastolic  parameters are indeterminate.   2. Right ventricular systolic function is normal. The right ventricular  size is normal. There is moderately elevated pulmonary artery systolic  pressure. The estimated right ventricular systolic pressure is 50.3 mmHg.   3. Left atrial size was severely dilated.   4. The  mitral valve is degenerative. Mild mitral valve regurgitation.  Moderate to severe mitral annular calcification.   5. Tricuspid valve regurgitation is mild to moderate.   6. The aortic valve has been repaired/replaced. Aortic valve  regurgitation is not visualized. There is a 26 mm Sapien prosthetic (TAVR)  valve present in the aortic position. Aortic  valve mean gradient measures  15.8 mmHg.   7. The inferior vena cava is normal in size with greater than 50%  respiratory variability, suggesting right atrial pressure of 3 mmHg.   Comparison(s): Prior images reviewed side by side. LVEF 60-65% with  indeterminate diastolic function. TAVR present with mean AV gradient 15.8  mmHg and no paravalvular regurgitation.    Physical Exam:   VS:  BP 102/60 (BP Location: Left Arm, Cuff Size: Normal)   Pulse 71   Ht 6' (1.829 m)   Wt 218 lb 6.4 oz (99.1 kg)   SpO2 96%   BMI 29.62 kg/m    Wt Readings from Last 3 Encounters:  09/02/24 218 lb 6.4 oz (99.1 kg)  08/25/24 205 lb (93 kg)  08/11/24 198 lb (89.8 kg)    GEN: Pleasant elderly male appearing in no acute distress NECK: No JVD; No carotid bruits CARDIAC: RRR, no murmurs, rubs, gallops RESPIRATORY:  Clear to auscultation without rales, wheezing or rhonchi  ABDOMEN: Appears non-distended. No obvious abdominal masses. EXTREMITIES: No clubbing or cyanosis. Trace lower extremity edema bilaterally.  Distal pedal pulses are 2+ bilaterally.   Assessment and Plan:   1. S/P TAVR (transcatheter aortic valve replacement) - He recently underwent TAVR in 07/2024 and recent echocardiogram showed normal functioning of his TAVR valve with mean gradient of 15.8 mmHg and no evidence of paravalvular leakage. Continue ASA 81mg  daily.   2. Chronic heart failure with preserved ejection fraction (HFpEF) (HCC) - Recent echocardiogram showed a preserved EF of 60 to 65% with mild LVH and normal RV function. He does have trace edema on examination today but lungs are clear. Continue current medical therapy with Torsemide  20 mg daily and Farxiga  10 mg daily. Creatinine was stable at 0.83 when checked in 07/2024.  3. Frequent PVCs - His heart rate was reading in the 30's initially and repeat EKG showed normal sinus rhythm with frequent PVC's which were likely accounting for the bradycardic reading. Prior monitor  in 05/2024 showed a 17.8% PVC burden.   - He denies any recent palpitations. Continue Lopressor  25 mg twice daily for now. Unable to further titrate given his BP at 102/60.  4. Essential hypertension - His blood pressure is soft at 102/60 during today's visit but he denies any associated symptoms. Continue Lopressor  25 mg twice daily.  Signed, Laymon CHRISTELLA Qua, PA-C   "

## 2024-09-01 NOTE — Progress Notes (Signed)
*  PRELIMINARY RESULTS* Echocardiogram 2D Echocardiogram has been performed.  Gregory Pearson 09/01/2024, 12:52 PM

## 2024-09-02 ENCOUNTER — Ambulatory Visit: Attending: Student | Admitting: Student

## 2024-09-02 ENCOUNTER — Encounter: Payer: Self-pay | Admitting: Student

## 2024-09-02 VITALS — BP 102/60 | HR 71 | Ht 72.0 in | Wt 218.4 lb

## 2024-09-02 DIAGNOSIS — I5032 Chronic diastolic (congestive) heart failure: Secondary | ICD-10-CM

## 2024-09-02 DIAGNOSIS — I10 Essential (primary) hypertension: Secondary | ICD-10-CM | POA: Diagnosis not present

## 2024-09-02 DIAGNOSIS — I493 Ventricular premature depolarization: Secondary | ICD-10-CM | POA: Diagnosis not present

## 2024-09-02 DIAGNOSIS — Z952 Presence of prosthetic heart valve: Secondary | ICD-10-CM

## 2024-09-02 NOTE — Patient Instructions (Addendum)
 Medication Instructions:  Your physician recommends that you continue on your current medications as directed. Please refer to the Current Medication list given to you today.  *If you need a refill on your cardiac medications before your next appointment, please call your pharmacy*  Lab Work: NONE   If you have labs (blood work) drawn today and your tests are completely normal, you will receive your results only by: MyChart Message (if you have MyChart) OR A paper copy in the mail If you have any lab test that is abnormal or we need to change your treatment, we will call you to review the results.  Testing/Procedures: NONE    Follow-Up: At [facility], you and your health needs are our priority.  As part of our continuing mission to provide you with exceptional heart care, our providers are all part of one team.  This team includes your primary Cardiologist (physician) and Advanced Practice Providers or APPs (Physician Assistants and Nurse Practitioners) who all work together to provide you with the care you need, when you need it.  Your next appointment:   3 -4 month(s)  Provider:   Luane School, MD or Randall An, PA-C    We recommend signing up for the patient portal called "MyChart".  Sign up information is provided on this After Visit Summary.  MyChart is used to connect with patients for Virtual Visits (Telemedicine).  Patients are able to view lab/test results, encounter notes, upcoming appointments, etc.  Non-urgent messages can be sent to your provider as well.   To learn more about what you can do with MyChart, go to [email or web address].   Other Instructions Thank you for choosing [facility]!

## 2024-09-03 ENCOUNTER — Encounter: Payer: Self-pay | Admitting: Student

## 2024-09-08 ENCOUNTER — Other Ambulatory Visit: Payer: Self-pay

## 2024-09-08 MED ORDER — METOPROLOL TARTRATE 25 MG PO TABS: ORAL_TABLET | ORAL | 3 refills | Status: DC

## 2024-09-10 ENCOUNTER — Emergency Department (HOSPITAL_COMMUNITY)

## 2024-09-10 ENCOUNTER — Encounter (HOSPITAL_COMMUNITY): Payer: Self-pay | Admitting: Emergency Medicine

## 2024-09-10 ENCOUNTER — Emergency Department (HOSPITAL_COMMUNITY)
Admission: EM | Admit: 2024-09-10 | Discharge: 2024-09-10 | Disposition: A | Discharge status: Home / Self Care | Attending: Emergency Medicine | Admitting: Emergency Medicine

## 2024-09-10 ENCOUNTER — Other Ambulatory Visit: Payer: Self-pay

## 2024-09-10 ENCOUNTER — Ambulatory Visit

## 2024-09-10 ENCOUNTER — Encounter: Payer: Self-pay | Admitting: Family Medicine

## 2024-09-10 VITALS — BP 120/73 | HR 87 | Ht 72.0 in | Wt 214.0 lb

## 2024-09-10 DIAGNOSIS — W010XXA Fall on same level from slipping, tripping and stumbling without subsequent striking against object, initial encounter: Secondary | ICD-10-CM | POA: Diagnosis not present

## 2024-09-10 DIAGNOSIS — J40 Bronchitis, not specified as acute or chronic: Secondary | ICD-10-CM | POA: Diagnosis not present

## 2024-09-10 DIAGNOSIS — Y92009 Unspecified place in unspecified non-institutional (private) residence as the place of occurrence of the external cause: Secondary | ICD-10-CM | POA: Diagnosis not present

## 2024-09-10 DIAGNOSIS — S0990XA Unspecified injury of head, initial encounter: Secondary | ICD-10-CM

## 2024-09-10 DIAGNOSIS — R051 Acute cough: Secondary | ICD-10-CM | POA: Diagnosis not present

## 2024-09-10 DIAGNOSIS — Z7982 Long term (current) use of aspirin: Secondary | ICD-10-CM | POA: Insufficient documentation

## 2024-09-10 DIAGNOSIS — S51012A Laceration without foreign body of left elbow, initial encounter: Secondary | ICD-10-CM | POA: Insufficient documentation

## 2024-09-10 DIAGNOSIS — W19XXXA Unspecified fall, initial encounter: Secondary | ICD-10-CM

## 2024-09-10 DIAGNOSIS — S0101XA Laceration without foreign body of scalp, initial encounter: Secondary | ICD-10-CM | POA: Insufficient documentation

## 2024-09-10 LAB — VERITOR FLU A/B WAIVED
Influenza A: NEGATIVE
Influenza B: NEGATIVE

## 2024-09-10 MED ORDER — ALBUTEROL SULFATE HFA 108 (90 BASE) MCG/ACT IN AERS: 2.0000 | INHALATION_SPRAY | Freq: Four times a day (QID) | RESPIRATORY_TRACT | 0 refills | Status: AC | PRN

## 2024-09-10 MED ORDER — AMOXICILLIN-POT CLAVULANATE 875-125 MG PO TABS: 1.0000 | ORAL_TABLET | Freq: Two times a day (BID) | ORAL | 0 refills | Status: AC

## 2024-09-10 NOTE — Discharge Instructions (Signed)
 Change dressing daily to your arm.  Follow-up with your family doctor next week

## 2024-09-10 NOTE — ED Triage Notes (Signed)
 Pt bib EMS from home after pt tripped hitting head on wheel of walker. Per EMS, pt reported no LOC and states he takes ASA daily. EMS reports laceration to top of head with bleeding controlled as well as a skin tear to L forearm which they wrapped PTA.

## 2024-09-10 NOTE — ED Notes (Signed)
 Present nurse talked on the phone with [name] Bar, pt son, Mr [name] will come to pick patient up.

## 2024-09-10 NOTE — Progress Notes (Signed)
 "  BP 120/73   Pulse 87   Ht 6' (1.829 m)   Wt 214 lb (97.1 kg)   SpO2 97%   BMI 29.02 kg/m    Subjective:   Patient ID: Gregory Pearson, male    DOB: 21-Feb-1928, 89 y.o.   MRN: [id number]  HPI: Gregory Pearson is a 89 y.o. male presenting on 09/10/2024 for Cough   Discussed the use of AI scribe software for clinical note transcription with the patient, who gave verbal consent to proceed.  History of Present Illness   Gregory Pearson is a 89 year old male who presents with cough and phlegm.  Respiratory symptoms - Cough and phlegm production began primarily yesterday, with mild symptoms the day before - Phlegm described as originating from the chest and expectorated with coughing - Uses supplemental oxygen at home as needed, particularly at night at a rate of three liters, and during the day if needed while sitting - No significant swelling beyond baseline  Constitutional symptoms - Feels feverish and achy, but fever is not significant - Took Nyquil last night for symptom relief - Maintains good appetite despite feeling unwell - Weight stable with slight fluctuation of about four pounds  Exposure history - No recent illness noted among family members          Relevant past medical, surgical, family and social history reviewed and updated as indicated. Interim medical history since our last visit reviewed. Allergies and medications reviewed and updated.  Review of Systems  Constitutional:  Positive for chills. Negative for fever.  HENT:  Positive for congestion, postnasal drip and rhinorrhea. Negative for ear discharge, ear pain, sinus pressure, sneezing, sore throat and voice change.   Eyes:  Negative for pain, discharge, redness and visual disturbance.  Respiratory:  Positive for cough and wheezing. Negative for shortness of breath.   Cardiovascular:  Negative for chest pain and leg swelling.  Musculoskeletal:  Positive for myalgias. Negative for back pain and gait  problem.  Skin:  Negative for rash.  All other systems reviewed and are negative.   Per HPI unless specifically indicated above   Allergies as of 09/10/2024       Reactions   Nsaids Other (See Comments)   GI bleed, PUD   Ivp Dye [iodinated Contrast Media] Swelling, Rash        Medication List        Accurate as of September 10, 2024 12:28 PM. If you have any questions, ask your nurse or doctor.          acetaminophen  500 MG tablet Commonly known as: TYLENOL  Take 1 tablet (500 mg total) by mouth every 6 (six) hours as needed.   albuterol  108 (90 Base) MCG/ACT inhaler Commonly known as: VENTOLIN  HFA Inhale 2 puffs into the lungs every 6 (six) hours as needed for wheezing or shortness of breath. Started by: Fonda Levins, MD   amoxicillin -clavulanate 875-125 MG tablet Commonly known as: AUGMENTIN  Take 1 tablet by mouth 2 (two) times daily. Started by: Fonda Levins, MD   ascorbic acid 500 MG tablet Commonly known as: VITAMIN C Take 500 mg by mouth in the morning.   aspirin  81 MG chewable tablet Chew 1 tablet (81 mg total) by mouth daily with breakfast.   dapagliflozin  propanediol 10 MG Tabs tablet Commonly known as: Farxiga  Take 1 tablet (10 mg total) by mouth daily before breakfast.   MAGNESIUM  LACTATE PO Take 1 tablet by mouth at bedtime.   metoprolol   tartrate 25 MG tablet Commonly known as: LOPRESSOR  Take 1 tablet (25 mg total) twice a day.HOLD for blood pressure less than 100   multivitamin tablet Take 1 tablet by mouth in the morning.   sertraline  100 MG tablet Commonly known as: Zoloft  Take 1 tablet (100 mg total) by mouth daily.   torsemide  20 MG tablet Commonly known as: DEMADEX  Take 1 tablet (20 mg total) by mouth daily.   traMADol  50 MG tablet Commonly known as: ULTRAM  Take 50 mg by mouth every 6 (six) hours as needed for moderate pain (pain score 4-6).   traZODone  100 MG tablet Commonly known as: DESYREL  TAKE 1 TABLET BY MOUTH AT   BEDTIME AS NEEDED FOR SLEEP   VITAMIN B-12 PO Take 1 tablet by mouth in the morning.   VITAMIN B-2 PO Take 1 tablet by mouth daily.         Objective:   BP 120/73   Pulse 87   Ht 6' (1.829 m)   Wt 214 lb (97.1 kg)   SpO2 97%   BMI 29.02 kg/m   Wt Readings from Last 3 Encounters:  09/10/24 214 lb (97.1 kg)  09/02/24 218 lb 6.4 oz (99.1 kg)  08/25/24 205 lb (93 kg)    Physical Exam Physical Exam   VITALS: SaO2- 97% CHEST: Wheezing present. CARDIOVASCULAR: Heart regular rate and rhythm, no murmurs.         Assessment & Plan:   Problem List Items Addressed This Visit   None Visit Diagnoses       Acute cough    -  Primary   Relevant Medications   albuterol  (VENTOLIN  HFA) 108 (90 Base) MCG/ACT inhaler   Other Relevant Orders   Veritor Flu A/B Waived     Bronchitis       Relevant Medications   albuterol  (VENTOLIN  HFA) 108 (90 Base) MCG/ACT inhaler          Acute lower respiratory infection Acute cough with phlegm and mild fever. Wheezing noted. - Ordered influenza swab.  Chronic respiratory failure Managed with supplemental oxygen at 3 liters as needed. Oxygen saturation stable at 97%. No fluid retention or weight gain. - Continue supplemental oxygen at 3 liters as needed.     Flu test negative, likely bronchitis, especially with his wheezing we will go ahead and send in amoxicillin  for him.  Also send albuterol .     Follow up plan: Return if symptoms worsen or fail to improve.  Counseling provided for all of the vaccine components Orders Placed This Encounter  Procedures   Veritor Flu A/B Waived    Fonda Levins, MD [facility] 09/10/2024, 12:28 PM     "

## 2024-09-10 NOTE — ED Notes (Signed)
 ED Provider at bedside.

## 2024-09-12 NOTE — ED Provider Notes (Signed)
 " [place] [facility] AT [facility] [address] Provider Note   CSN: [id number] Arrival date & time: 09/10/24  1920     Patient presents with: Felton Jeralyn JAYSON Tita is a 89 y.o. male.   Patient states he tripped and hit his head on his walker.  No loss of consciousness.  Also complains of bleeding to his left elbow  The history is provided by the patient and medical records. No language interpreter was used.  Fall This is a new problem. The current episode started 6 to 12 hours ago. The problem occurs rarely. The problem has been resolved. Pertinent negatives include no chest pain, no abdominal pain and no headaches. Nothing aggravates the symptoms. Nothing relieves the symptoms. He has tried nothing for the symptoms.       Prior to Admission medications  Medication Sig Start Date End Date Taking? Authorizing Provider  acetaminophen  (TYLENOL ) 500 MG tablet Take 1 tablet (500 mg total) by mouth every 6 (six) hours as needed. 11/14/21   Ijaola, Onyeje M, NP  albuterol  (VENTOLIN  HFA) 108 (90 Base) MCG/ACT inhaler Inhale 2 puffs into the lungs every 6 (six) hours as needed for wheezing or shortness of breath. 09/10/24   Dettinger, Fonda LABOR, MD  amoxicillin -clavulanate (AUGMENTIN ) 875-125 MG tablet Take 1 tablet by mouth 2 (two) times daily. 09/10/24   Dettinger, Fonda LABOR, MD  ascorbic acid (VITAMIN C) 500 MG tablet Take 500 mg by mouth in the morning.    [provider]  aspirin  81 MG chewable tablet Chew 1 tablet (81 mg total) by mouth daily with breakfast. 08/02/24   Sebastian Lamarr SAUNDERS, PA-C  Cyanocobalamin (VITAMIN B-12 PO) Take 1 tablet by mouth in the morning.    [provider]  dapagliflozin  propanediol (FARXIGA ) 10 MG TABS tablet Take 1 tablet (10 mg total) by mouth daily before breakfast. 08/02/24   Thompson, Kathryn R, PA-C  MAGNESIUM  LACTATE PO Take 1 tablet by mouth at bedtime.    [provider]  metoprolol  tartrate (LOPRESSOR ) 25 MG tablet  Take 1 tablet (25 mg total) twice a day.HOLD for blood pressure less than 100 09/08/24   Sebastian Lamarr SAUNDERS, PA-C  Multiple Vitamin (MULTIVITAMIN) tablet Take 1 tablet by mouth in the morning.    [provider]  Riboflavin (VITAMIN B-2 PO) Take 1 tablet by mouth daily.    [provider]  sertraline  (ZOLOFT ) 100 MG tablet Take 1 tablet (100 mg total) by mouth daily. 12/24/23   Dettinger, Fonda LABOR, MD  torsemide  (DEMADEX ) 20 MG tablet Take 1 tablet (20 mg total) by mouth daily. 08/02/24   Sebastian Lamarr SAUNDERS, PA-C  traMADol  (ULTRAM ) 50 MG tablet Take 50 mg by mouth every 6 (six) hours as needed for moderate pain (pain score 4-6).    [provider]  traZODone  (DESYREL ) 100 MG tablet TAKE 1 TABLET BY MOUTH AT  BEDTIME AS NEEDED FOR SLEEP 08/11/24   Dettinger, Fonda LABOR, MD    Allergies: Nsaids and Ivp dye [iodinated contrast media]    Review of Systems  Constitutional:  Negative for appetite change and fatigue.  HENT:  Negative for congestion, ear discharge and sinus pressure.        Headache  Eyes:  Negative for discharge.  Respiratory:  Negative for cough.   Cardiovascular:  Negative for chest pain.  Gastrointestinal:  Negative for abdominal pain and diarrhea.  Genitourinary:  Negative for frequency and hematuria.  Musculoskeletal:  Negative for back pain.  Abrasion to left arm  Skin:  Negative for rash.  Neurological:  Negative for seizures and headaches.  Psychiatric/Behavioral:  Negative for hallucinations.     Updated Vital Signs BP (!) 146/127 (BP Location: Right Arm)   Pulse (!) 51   Temp 99.6 F (37.6 C) (Oral)   Resp 16   Ht 6' (1.829 m)   Wt 97 kg   SpO2 96%   BMI 29.00 kg/m   Physical Exam Vitals and nursing note reviewed.  Constitutional:      Appearance: He is well-developed.  HENT:     Head: Normocephalic.     Comments: Small nonsuturable laceration to the scalp    Nose: Nose normal.  Eyes:     General: No scleral icterus.     Conjunctiva/sclera: Conjunctivae normal.  Neck:     Thyroid : No thyromegaly.  Cardiovascular:     Rate and Rhythm: Normal rate and regular rhythm.     Heart sounds: No murmur heard.    No friction rub. No gallop.  Pulmonary:     Breath sounds: No stridor. No wheezing or rales.  Chest:     Chest wall: No tenderness.  Abdominal:     General: There is no distension.     Tenderness: There is no abdominal tenderness. There is no rebound.  Musculoskeletal:        General: Normal range of motion.     Cervical back: Neck supple.     Comments: Skin tag to left arm elbow  Lymphadenopathy:     Cervical: No cervical adenopathy.  Skin:    Findings: No erythema or rash.  Neurological:     Mental Status: He is alert and oriented to person, place, and time.     Motor: No abnormal muscle tone.     Coordination: Coordination normal.  Psychiatric:        Behavior: Behavior normal.     (all labs ordered are listed, but only abnormal results are displayed) Labs Reviewed - No data to display  EKG: None  Radiology: CT Cervical Spine Wo Contrast Result Date: 09/10/2024 EXAM: CT CERVICAL SPINE WITHOUT CONTRAST 09/10/2024 08:42:22 PM TECHNIQUE: CT of the cervical spine was performed without the administration of intravenous contrast. Multiplanar reformatted images are provided for review. Automated exposure control, iterative reconstruction, and/or weight based adjustment of the mA/kV was utilized to reduce the radiation dose to as low as reasonably achievable. COMPARISON: None available. CLINICAL HISTORY: Neck trauma (Age >= 65y). FINDINGS: BONES AND ALIGNMENT: No acute fracture or traumatic malalignment. DEGENERATIVE CHANGES: Multilevel upper cervical facet arthrosis. Bilateral neural foraminal narrowing at C3-C4 and C4-C5. No high-grade spinal canal stenosis. SOFT TISSUES: No prevertebral soft tissue swelling. IMPRESSION: 1. No evidence of acute traumatic injury. Electronically signed by: Franky Stanford MD 09/10/2024 08:50 PM EST RP Workstation: [id number]   CT Head Wo Contrast Result Date: 09/10/2024 EXAM: CT HEAD WITHOUT CONTRAST 09/10/2024 08:42:22 PM TECHNIQUE: CT of the head was performed without the administration of intravenous contrast. Automated exposure control, iterative reconstruction, and/or weight based adjustment of the mA/kV was utilized to reduce the radiation dose to as low as reasonably achievable. COMPARISON: None available. CLINICAL HISTORY: Head trauma, minor (Age >= 65y) FINDINGS: BRAIN AND VENTRICLES: No acute hemorrhage. No evidence of acute infarct. No hydrocephalus. No extra-axial collection. No mass effect or midline shift. Mild periventricular white matter disease. Mild calcific atheromatous disease. ORBITS: Status post bilateral lens replacement. SINUSES: Mild bilateral maxillary sinus mucosal thickening. SOFT TISSUES AND SKULL: No  acute soft tissue abnormality. No skull fracture. IMPRESSION: 1. No acute intracranial abnormality. Electronically signed by: Franky Stanford MD 09/10/2024 08:49 PM EST RP Workstation: [id number]     Procedures   Medications Ordered in the ED - No data to display                                  Medical Decision Making Amount and/or Complexity of Data Reviewed Radiology: ordered.   CT head and cervical spine unremarkable.  Abrasion and skin tear to 2 left elbow.  Patient had abrasions dressed and he will follow-up with his doctor     Final diagnoses:  Fall, initial encounter  Injury of head, initial encounter    ED Discharge Orders     None          Suzette Pac, MD 09/12/24 [phone]  "

## 2024-09-13 ENCOUNTER — Other Ambulatory Visit: Payer: Self-pay

## 2024-09-13 MED ORDER — METOPROLOL TARTRATE 25 MG PO TABS: ORAL_TABLET | ORAL | 3 refills | Status: AC

## 2024-09-13 MED ORDER — TORSEMIDE 20 MG PO TABS: 20.0000 mg | ORAL_TABLET | Freq: Every day | ORAL | 3 refills | Status: AC

## 2024-10-07 ENCOUNTER — Ambulatory Visit: Payer: Medicare Other

## 2024-12-31 ENCOUNTER — Ambulatory Visit: Admitting: Student
